# Patient Record
Sex: Male | Born: 1950
Health system: Southern US, Community
[De-identification: ages and names within clinical notes are randomized; demographics above are authoritative.]

## PROBLEM LIST (undated history)

## (undated) ENCOUNTER — Ambulatory Visit

## (undated) ENCOUNTER — Encounter: Attending: Internal Medicine | Primary: Internal Medicine

## (undated) ENCOUNTER — Ambulatory Visit: Attending: Surgery | Primary: Surgery

## (undated) ENCOUNTER — Telehealth: Attending: Internal Medicine | Primary: Internal Medicine

## (undated) ENCOUNTER — Telehealth

## (undated) ENCOUNTER — Ambulatory Visit: Payer: MEDICARE

## (undated) ENCOUNTER — Telehealth: Attending: Oncology | Primary: Oncology

## (undated) ENCOUNTER — Encounter: Attending: Oncology | Primary: Oncology

## (undated) ENCOUNTER — Encounter: Payer: MEDICARE | Attending: Internal Medicine | Primary: Internal Medicine

## (undated) ENCOUNTER — Encounter

## (undated) ENCOUNTER — Ambulatory Visit: Payer: MEDICARE | Attending: Internal Medicine | Primary: Internal Medicine

## (undated) ENCOUNTER — Inpatient Hospital Stay

## (undated) ENCOUNTER — Telehealth: Attending: Surgery | Primary: Surgery

## (undated) DIAGNOSIS — I959 Hypotension, unspecified: Secondary | ICD-10-CM

## (undated) DIAGNOSIS — K219 Gastro-esophageal reflux disease without esophagitis: Secondary | ICD-10-CM

## (undated) DIAGNOSIS — F0390 Unspecified dementia without behavioral disturbance: Secondary | ICD-10-CM

## (undated) DIAGNOSIS — I639 Cerebral infarction, unspecified: Secondary | ICD-10-CM

## (undated) DIAGNOSIS — C801 Malignant (primary) neoplasm, unspecified: Secondary | ICD-10-CM

## (undated) DIAGNOSIS — G8929 Other chronic pain: Secondary | ICD-10-CM

## (undated) DIAGNOSIS — E785 Hyperlipidemia, unspecified: Secondary | ICD-10-CM

## (undated) DIAGNOSIS — K921 Melena: Secondary | ICD-10-CM

## (undated) DIAGNOSIS — K649 Unspecified hemorrhoids: Secondary | ICD-10-CM

## (undated) DIAGNOSIS — J449 Chronic obstructive pulmonary disease, unspecified: Secondary | ICD-10-CM

## (undated) DIAGNOSIS — K920 Hematemesis: Secondary | ICD-10-CM

## (undated) DIAGNOSIS — M549 Dorsalgia, unspecified: Secondary | ICD-10-CM

## (undated) HISTORY — DX: Malignant (primary) neoplasm, unspecified: C80.1

## (undated) HISTORY — PX: HEMORRHOID SURGERY: SHX153

## (undated) HISTORY — PX: BACK SURGERY: SHX140

## (undated) HISTORY — PX: RECTAL SURGERY: SHX760

## (undated) HISTORY — PX: HERNIA REPAIR: SHX51

## (undated) HISTORY — DX: Cerebral infarction, unspecified: I63.9

## (undated) MED ORDER — PANTOPRAZOLE 40 MG TABLET,DELAYED RELEASE: 0 days

## (undated) MED ORDER — PSYLLIUM HUSK (ASPARTAME) 3.4 GRAM ORAL POWDER PACKET: Freq: Every day | ORAL | 0 days

## (undated) MED ORDER — ATORVASTATIN 80 MG TABLET: 0 days

---

## 2000-10-04 ENCOUNTER — Emergency Department (HOSPITAL_COMMUNITY): Admission: EM | Admit: 2000-10-04 | Discharge: 2000-10-04 | Payer: Self-pay | Admitting: Emergency Medicine

## 2007-02-26 ENCOUNTER — Encounter: Payer: Self-pay | Admitting: Emergency Medicine

## 2007-02-26 ENCOUNTER — Observation Stay (HOSPITAL_COMMUNITY): Admission: EM | Admit: 2007-02-26 | Discharge: 2007-02-27 | Payer: Self-pay | Admitting: Emergency Medicine

## 2007-03-09 ENCOUNTER — Ambulatory Visit (HOSPITAL_COMMUNITY): Admission: RE | Admit: 2007-03-09 | Discharge: 2007-03-09 | Payer: Self-pay | Admitting: Neurosurgery

## 2007-03-10 ENCOUNTER — Ambulatory Visit (HOSPITAL_COMMUNITY): Admission: RE | Admit: 2007-03-10 | Discharge: 2007-03-11 | Payer: Self-pay | Admitting: Neurosurgery

## 2007-04-25 ENCOUNTER — Encounter: Admission: RE | Admit: 2007-04-25 | Discharge: 2007-06-29 | Payer: Self-pay | Admitting: Neurosurgery

## 2007-05-10 ENCOUNTER — Encounter: Admission: RE | Admit: 2007-05-10 | Discharge: 2007-05-10 | Payer: Self-pay | Admitting: Neurosurgery

## 2007-07-06 ENCOUNTER — Encounter: Admission: RE | Admit: 2007-07-06 | Discharge: 2007-07-06 | Payer: Self-pay | Admitting: Neurosurgery

## 2009-12-17 ENCOUNTER — Emergency Department (HOSPITAL_COMMUNITY)
Admission: EM | Admit: 2009-12-17 | Discharge: 2009-12-17 | Payer: Self-pay | Source: Home / Self Care | Admitting: Emergency Medicine

## 2010-02-01 ENCOUNTER — Encounter: Payer: Self-pay | Admitting: Neurosurgery

## 2010-05-26 NOTE — Op Note (Signed)
Todd Berg, Todd Berg               ACCOUNT NO.:  [id number]   MEDICAL RECORD NO.:  [id number]          PATIENT TYPE:  INP   LOCATION:  3533                         FACILITY:  [facility]   PHYSICIAN:  Reinaldo Meeker, M.D. DATE OF BIRTH:  [birth year]   DATE OF PROCEDURE:  03/10/2007  DATE OF DISCHARGE:  03/11/2007                               OPERATIVE REPORT   PREOPERATIVE DIAGNOSES:  Degenerative disease and foraminal collapse, L5-  S1.   POSTOPERATIVE DIAGNOSES:  Degenerative disease and foraminal collapse,  L5-S1.   PROCEDURE:  L5-S1 AxiLIFF, followed by bilateral posterior  instrumentation with trans facet screw fixation.   SURGEON:  Reinaldo Meeker, M.D.   ASSISTANT:  Donalee Citrin, M.D.   PROCEDURE IN DETAIL:  After being placed in the prone position, the  patient's buttock and back were prepped and draped in usual sterile  fashion.  AP and lateral fluoroscopy were brought into the field; used  for excellent localization.  A lateral incision was made on the left  side of the buttock crease, in line with the lower end of the coccyx.  This was carried down through a double fascial layer and into the  retroperitoneum.  The underside of the coccyx and sacrum were finger  swept, to begin to separate the bowel away.  The blunt probe was then  placed along the edge of the sacrum, and moved from left to right in a  caudal to cephalad direction; in order to sweep down and make for a  working channel.  When it was in a good position, it was docked into the  sacrum to the distance of the superior edge of the sacrum.  The  projection was found to be excellent.  Sequential dilatation was then  carried out and the working cannula left approximately 1-2 cm into the  sacrum.  Drilling was then carried up to the superior edge of the sacrum  and then into the disk space.  A variety of rotating instruments were  used to remove disk, followed by removing them with the lyre remover.  Some  autologous bone actively infused with BMP were then placed in the  interspace, and followed under fluoroscopy into excellent position.  The  drill was then passed up into the L5 body to the appropriate level.  At  this time, the exchange cannula with a bushing was used to allow  placement of the large working cannula for placement of the screw.  This  was secured with a guidewire.  The inner bushings were then removed and  the screw placed without difficulty.  There was one in the body at L5,  again excellent distraction of the L5-S1 disk space.  At this time, the  working cannula was removed without difficulty.  The wound was then  irrigated and closed with interrupted Vicryl and Dermabond on the skin.  Percutaneous trans facet screws were then placed.  A small stab wound  incision was made by the superior edge of the spinous process of L4.  Applying AP and lateral fluoroscopy, docking tool was used to gently  enter the  inferior facet of L5.  The guidewire was then passed through  the inferior facet of L5 and into the pedicle of S1 to appropriate  depth.  Drilling was then carried out,  followed by tapping and placing  of 4 mm x 25 mm screws on the left, and 4-0 x 30 mm screws on the right.  Final fluoroscopy in AP and lateral direction showed excellent placement  of the large AxiLIFF spacer, as well as the 2 trans facet screws.  Prior  to placing intracranial facet screws, a small pledget with BMP had been  placed  down the cannula as well.  At this time, the wound was irrigated and  closed with 2 interrupted Vicryls and a Dermabond on the skin.  Sterile  dressings were then applied.  The patient was extubated and taken to the  recovery room in stable condition.           ______________________________  Reinaldo Meeker, M.D.     ROK/MEDQ  D:  03/10/2007  T:  03/11/2007  Job:  [id number]

## 2010-05-26 NOTE — Op Note (Signed)
NAMESENICA, CRALL               ACCOUNT NO.:  [id number]   MEDICAL RECORD NO.:  [id number]          PATIENT TYPE:  OIB   LOCATION:  3533                         FACILITY:  [facility]   PHYSICIAN:  Reinaldo Meeker, M.D. DATE OF BIRTH:  1950/11/05   DATE OF PROCEDURE:  03/10/2007  DATE OF DISCHARGE:  03/11/2007                               OPERATIVE REPORT   PREOPERATIVE DIAGNOSIS:  Degenerative disk disease with foraminal  collapse, L5-S1.   POSTOPERATIVE DIAGNOSIS:  Degenerative disk disease with foraminal  collapse, L5-S1.   PROCEDURES:  1. Anterior diskectomy at L5-S1 with anterior retroperitoneal      technique.  2. Placement of titanium interbody cage with allograft, autograft      fusion.  3. Placement of posterior transfacet screw at L5-S1 bilaterally with      minimally invasive technique.  4. Intra-facet fusion.   SURGEON:  Reinaldo Meeker, MD   ASSISTANT:  Donalee Citrin, MD.   HISTORY:  Todd Berg is a 60 year old gentleman with prior diskectomy and  degenerative disk at L5-S1.  He is exhausted all conservative measures  and explored all options of surgical intervention and after surgical  intervention with an anterior interbody fusion and posterior transfacet  screw stabilization.  Full informed consent and risks were discussed  including bleeding, infection, weakness, numbness, paralysis, trouble  with instrumentation, non-union, bowel perforation, and death.  We have  discussed alternative methods of therapy all risks and benefits on  intervention.  Todd Berg had the opportunity to ask questions and  appears to understand.  With this information in hand, he has requested  to proceed with surgery.  He is admitted at this time for the above-  mentioned procedure.   PROCEDURE IN DETAIL:  After the patient was taken to the surgery and  placed on general endotracheal anesthesia, he was placed the prone  position on the operating table.  The operative procedure was done  with  AP and lateral fluoroscopy and incision was made lateral to the tip of  the coccyx and the coccygeal notch.  Sacral spinous ligament was entered  with a 2-cm incision.  We punctured through the fascia, carefully guided  this on the anterior part of the sacrum in the midline and followed this  with biplanar fluoroscopy.  We guided this to the sacral promontory and  chose the sacral entry point in the midline and then tapped the  guidewire up through the sacrum across the disk into the L5-S1 disk.  The dilating trocars were placed.  The 9-mm working channel was placed  and then we drilled out the bone through the sacrum and initiated the  radial diskectomy, scraped the endplates both superiorly and inferiorly  circumferentially moving as much disk material as possible.  Tissue  retractors were used to complete the diskectomy.  After this was  completed, the disk space was then packed with a morselized allograft  bone and BMP and bone expander.  A 71.5-mm drill was then inserted  through the disk and the L5 vertebral body and the measurement was taken  to approximately a 45-mm  interbody cage.  The interbody cage then was  passed up and the exchange cannula was placed.  The interbody cage was  __________  in to position superiorly interbody of L5 engaged into the  sacral promontory S1-S2.  The exchange cannula was then removed.  The  wound was then closed with interrupted Vicryl on the subcutaneous and  subcuticular tissues and staples on the skin.  This was done after large  amounts of irrigation were carried out.  Attention was then turned to  the L5-S1 transfacet screws.  Percutaneous transfacet screws were then  placed.  A small stab wound incision was made at the superior edges to  the spinous process of L4.  Plane AP and lateral fluoroscopy docking  tool was used to gently enter the inferior facet of L5.  The guidewire  was then passed through the inferior facet of L5 and to the  pedicle of  S1 to appropriate depth.  Drill was then carried out followed by tapping  and placing of 4-mm x 25-mm screws on the left and 4-mm x 30-mm screws  on the right.  Final fluoroscopy AP and lateral direction showed  excellent placement of the 2 transfacet screws.  Prior to placing, the  intracranial facet screws, a small pledget with BMP had been applied for  an intra-facet fusion.  At this time, the wounds were irrigated  copiously and closed with Vicryl stitches and Dermabond on the skin.  Sterile dressings were then applied and the patient was extubated and  taken to recovery in stable condition.           ______________________________  Reinaldo Meeker, M.D.     ROK/MEDQ  D:  05/25/2007  T:  05/26/2007  Job:  [id number]

## 2010-05-26 NOTE — Discharge Summary (Signed)
Todd Berg, Todd Berg               ACCOUNT NO.:  [id number]   MEDICAL RECORD NO.:  [id number]          PATIENT TYPE:  OBV   LOCATION:  5024                         FACILITY:  [facility]   PHYSICIAN:  Cherylynn Ridges, M.D.    DATE OF BIRTH:  11/07/1950   DATE OF ADMISSION:  02/26/2007  DATE OF DISCHARGE:  02/27/2007                               DISCHARGE SUMMARY   DISCHARGE DIAGNOSES:  1. Fall.  2. Alcohol abuse.  3. Posterior cervical ligamentous laxity.  4. Chronic lower back pain.   CONSULTANTS:  Dr. Jordan Likes for neurosurgery.   PROCEDURE:  None.   HISTORY OF PRESENT ILLNESS:  This is a 60 year old white male who is a  habitual alcoholic.  He got inebriated yesterday and fell three times at  home.  Came in as a non-trauma code.  Workup did not demonstrate any  abnormalities, but because the patient was inebriated and it was not  felt safe for him to go home, he was transferred to [facility] from [place]  [facility] and admitted by the trauma service.   HOSPITAL COURSE:  The patient's hospital course overnight was  uneventful.  He had flexion/extension films of his C-spine the following  morning because he complained of some inferior cervical tenderness to  palpation.  Flexion/extension films showed some laxity in the posterior  ligamentous elements caudally.  These findings were reviewed with Dr.  Jordan Likes who recommended discharge home in a cervical collar.  The patient  already had a follow-up on Friday with Dr. Gerlene Fee for the preexisting  low back pain.  The patient was in agreement with this.  He was  discharged home in the care of his family in good condition.   DISCHARGE MEDICATIONS:  Norco 5/325 take one to two p.o. q.4 h p.r.n.  pain #50 with no refill.   FOLLOW UP:  The patient follow up Dr. Gerlene Fee on Friday.      Earney Hamburg, P.A.      Cherylynn Ridges, M.D.  Electronically Signed   MJ/MEDQ  D:  02/27/2007  T:  02/28/2007  Job:  [id number]   cc:   Reinaldo Meeker, M.D.

## 2010-10-02 LAB — URINALYSIS, ROUTINE W REFLEX MICROSCOPIC
Bilirubin Urine: NEGATIVE
Glucose, UA: NEGATIVE
Hgb urine dipstick: NEGATIVE
Ketones, ur: NEGATIVE
Nitrite: NEGATIVE
Protein, ur: NEGATIVE
Specific Gravity, Urine: 1.003 — ABNORMAL LOW
Urobilinogen, UA: 0.2
pH: 6.5

## 2010-10-02 LAB — DIFFERENTIAL
Basophils Absolute: 0
Basophils Absolute: 0.1
Basophils Relative: 0
Basophils Relative: 1
Eosinophils Absolute: 0.4
Eosinophils Absolute: 0.4
Eosinophils Relative: 5
Eosinophils Relative: 5
Lymphocytes Relative: 34
Lymphs Abs: 2.5
Monocytes Absolute: 0.4
Monocytes Absolute: 0.4
Monocytes Relative: 5
Neutro Abs: 4
Neutrophils Relative %: 55

## 2010-10-02 LAB — COMPREHENSIVE METABOLIC PANEL
ALT: 15
AST: 16
Albumin: 3.2 — ABNORMAL LOW
Alkaline Phosphatase: 84
BUN: 4 — ABNORMAL LOW
CO2: 25
Calcium: 8.9
Chloride: 112
Creatinine, Ser: 0.66
GFR calc Af Amer: >60
GFR calc non Af Amer: >60
Glucose, Bld: 107 — ABNORMAL HIGH
Potassium: 3.8
Sodium: 146 — ABNORMAL HIGH
Total Bilirubin: 0.4
Total Protein: 6.8

## 2010-10-02 LAB — CBC
HCT: 41.7
HCT: 42.4
HCT: 43.2
Hemoglobin: 14
Hemoglobin: 14.2
Hemoglobin: 14.7
MCHC: 33
MCHC: 34
MCHC: 34
MCV: 90.4
MCV: 90.8
Platelets: 262
Platelets: 302
RBC: 4.61
RBC: 4.69
RBC: 4.76
RDW: 11.7
RDW: 12
RDW: 12.1
WBC: 6.8
WBC: 7.4

## 2010-10-02 LAB — ETHANOL
Alcohol, Ethyl (B): 109 — ABNORMAL HIGH
Alcohol, Ethyl (B): 175 — ABNORMAL HIGH

## 2010-10-02 LAB — BASIC METABOLIC PANEL
BUN: 4 — ABNORMAL LOW
CO2: 26
CO2: 27
Calcium: 9
Chloride: 105
Creatinine, Ser: 0.7
GFR calc Af Amer: >60
GFR calc non Af Amer: >60
Glucose, Bld: 93
Glucose, Bld: 95
Potassium: 3.5
Potassium: 4.3
Sodium: 137
Sodium: 141

## 2010-10-02 LAB — RAPID URINE DRUG SCREEN, HOSP PERFORMED
Amphetamines: NOT DETECTED
Barbiturates: NOT DETECTED
Benzodiazepines: NOT DETECTED
Cocaine: NOT DETECTED
Opiates: NOT DETECTED
Tetrahydrocannabinol: NOT DETECTED

## 2011-05-17 ENCOUNTER — Other Ambulatory Visit: Payer: Self-pay | Admitting: Family Medicine

## 2011-05-17 ENCOUNTER — Ambulatory Visit (HOSPITAL_COMMUNITY)
Admission: RE | Admit: 2011-05-17 | Discharge: 2011-05-17 | Disposition: A | Discharge status: Home / Self Care | Payer: Medicare Other | Source: Ambulatory Visit | Attending: Family Medicine | Admitting: Family Medicine

## 2011-05-17 DIAGNOSIS — N433 Hydrocele, unspecified: Secondary | ICD-10-CM | POA: Insufficient documentation

## 2011-05-17 DIAGNOSIS — N50812 Left testicular pain: Secondary | ICD-10-CM

## 2011-05-17 DIAGNOSIS — I861 Scrotal varices: Secondary | ICD-10-CM | POA: Insufficient documentation

## 2011-05-17 DIAGNOSIS — N509 Disorder of male genital organs, unspecified: Secondary | ICD-10-CM | POA: Insufficient documentation

## 2012-01-18 ENCOUNTER — Ambulatory Visit: Payer: Medicare Other | Admitting: Urology

## 2012-07-04 ENCOUNTER — Ambulatory Visit (INDEPENDENT_AMBULATORY_CARE_PROVIDER_SITE_OTHER): Payer: Medicare Other | Admitting: Urology

## 2012-07-04 DIAGNOSIS — N529 Male erectile dysfunction, unspecified: Secondary | ICD-10-CM

## 2012-07-04 DIAGNOSIS — N486 Induration penis plastica: Secondary | ICD-10-CM

## 2012-09-05 ENCOUNTER — Ambulatory Visit (INDEPENDENT_AMBULATORY_CARE_PROVIDER_SITE_OTHER): Payer: Medicare Other | Admitting: Urology

## 2012-09-05 DIAGNOSIS — N529 Male erectile dysfunction, unspecified: Secondary | ICD-10-CM

## 2012-09-05 DIAGNOSIS — N486 Induration penis plastica: Secondary | ICD-10-CM

## 2012-11-07 ENCOUNTER — Ambulatory Visit (INDEPENDENT_AMBULATORY_CARE_PROVIDER_SITE_OTHER): Payer: Medicare Other | Admitting: Urology

## 2012-11-07 DIAGNOSIS — N529 Male erectile dysfunction, unspecified: Secondary | ICD-10-CM

## 2013-01-11 DIAGNOSIS — C801 Malignant (primary) neoplasm, unspecified: Secondary | ICD-10-CM

## 2013-01-11 HISTORY — DX: Malignant (primary) neoplasm, unspecified: C80.1

## 2013-06-18 ENCOUNTER — Telehealth: Payer: Self-pay | Admitting: Family Medicine

## 2013-06-18 NOTE — Telephone Encounter (Signed)
APPT GIVEN FOR IN THE AM

## 2013-06-19 ENCOUNTER — Ambulatory Visit (INDEPENDENT_AMBULATORY_CARE_PROVIDER_SITE_OTHER): Payer: Medicare Other | Admitting: Family Medicine

## 2013-06-19 ENCOUNTER — Encounter: Payer: Self-pay | Admitting: Family Medicine

## 2013-06-19 ENCOUNTER — Encounter (INDEPENDENT_AMBULATORY_CARE_PROVIDER_SITE_OTHER): Payer: Self-pay

## 2013-06-19 VITALS — BP 147/73 | HR 50 | Temp 97.0°F | Ht 70.5 in | Wt 202.2 lb

## 2013-06-19 DIAGNOSIS — W57XXXA Bitten or stung by nonvenomous insect and other nonvenomous arthropods, initial encounter: Secondary | ICD-10-CM

## 2013-06-19 DIAGNOSIS — M25579 Pain in unspecified ankle and joints of unspecified foot: Secondary | ICD-10-CM

## 2013-06-19 DIAGNOSIS — T148 Other injury of unspecified body region: Secondary | ICD-10-CM

## 2013-06-19 DIAGNOSIS — IMO0002 Reserved for concepts with insufficient information to code with codable children: Secondary | ICD-10-CM

## 2013-06-19 DIAGNOSIS — R252 Cramp and spasm: Secondary | ICD-10-CM

## 2013-06-19 DIAGNOSIS — R195 Other fecal abnormalities: Secondary | ICD-10-CM

## 2013-06-19 DIAGNOSIS — S39013A Strain of muscle, fascia and tendon of pelvis, initial encounter: Secondary | ICD-10-CM

## 2013-06-19 MED ORDER — DOXYCYCLINE HYCLATE 100 MG PO TABS: 100.0000 mg | ORAL_TABLET | Freq: Two times a day (BID) | ORAL | Status: DC

## 2013-06-19 NOTE — Progress Notes (Signed)
   Subjective:    Patient ID: Todd Berg, male    DOB: 01-24-1950, 64 y.o.   MRN: [id number]  HPI This 63 y.o. male presents for evaluation of left inguinal tenderness and concerns he may have a hernia.  He has recently been bitten by a deer tick.  He has been having some discomfort in his right foot where he dropped an object on it and it was bruised but is now better.  He has been also having a lot problems with hs cramps.  He has been having blood in his stools and states and has positive FOBT.   Review of Systems C/o left inguinal pain, tick bite, right foot discomfort, and hs cramps No chest pain, SOB, HA, dizziness, vision change, N/V, diarrhea, constipation, dysuria, urinary urgency or frequency, myalgias, arthralgias or rash.     Objective:   Physical Exam  Vital signs noted  Well developed well nourished male.  HEENT - Head atraumatic Normocephalic                Eyes - PERRLA, Conjuctiva - clear Sclera- Clear EOMI                Ears - EAC's Wnl TM's Wnl Gross Hearing WNL                Nose - Nares patent                 Throat - oropharanx wnl Respiratory - Lungs CTA bilateral Cardiac - RRR S1 and S2 without murmur GI - Abdomen soft Nontender and bowel sounds active x  GU - uncircumcised penis, testicles without masses no inguinal hernia.  Left inguinal area tender and no hernia. Extremities - No edema. Neuro - Grossly intact. Skin - Umbilicus with erythematous area from tick bite MS - right foot normal exam and no bruising     Assessment & Plan:  Heme positive stool - Plan: Ambulatory referral to Gastroenterology  Tick bite - Plan: doxycycline (VIBRA-TABS) 100 MG tablet  Pain in joint, ankle and foot - No deformity or signs of injury and take motrin otc  Inguinal muscle strain - Reassured no hernia and recommend motrin otc  Leg  Cramps - Tonic water otc  Lysbeth Penner FNP

## 2013-06-20 ENCOUNTER — Encounter (INDEPENDENT_AMBULATORY_CARE_PROVIDER_SITE_OTHER): Payer: Self-pay | Admitting: *Deleted

## 2013-07-12 ENCOUNTER — Ambulatory Visit (INDEPENDENT_AMBULATORY_CARE_PROVIDER_SITE_OTHER): Payer: Medicare Other | Admitting: Internal Medicine

## 2013-11-06 ENCOUNTER — Ambulatory Visit: Payer: Medicare Other | Admitting: Urology

## 2013-12-12 ENCOUNTER — Telehealth: Payer: Self-pay | Admitting: Family Medicine

## 2013-12-13 ENCOUNTER — Ambulatory Visit: Payer: Medicare Other | Admitting: Family Medicine

## 2014-06-12 ENCOUNTER — Encounter (INDEPENDENT_AMBULATORY_CARE_PROVIDER_SITE_OTHER): Payer: Self-pay

## 2014-06-12 ENCOUNTER — Encounter: Payer: Self-pay | Admitting: Family

## 2014-06-12 ENCOUNTER — Ambulatory Visit (INDEPENDENT_AMBULATORY_CARE_PROVIDER_SITE_OTHER): Payer: Commercial Managed Care - HMO | Admitting: Family

## 2014-06-12 VITALS — BP 139/82 | HR 57 | Temp 96.7°F | Ht 70.5 in | Wt 203.4 lb

## 2014-06-12 DIAGNOSIS — L0100 Impetigo, unspecified: Secondary | ICD-10-CM

## 2014-06-12 DIAGNOSIS — G44209 Tension-type headache, unspecified, not intractable: Secondary | ICD-10-CM

## 2014-06-12 MED ORDER — BUTALBITAL-APAP-CAFFEINE 50-325-40 MG PO TABS: 1.0000 | ORAL_TABLET | Freq: Four times a day (QID) | ORAL | Status: DC | PRN

## 2014-06-12 MED ORDER — METHYLPREDNISOLONE ACETATE 80 MG/ML IJ SUSP
80.0000 mg | Freq: Once | INTRAMUSCULAR | Status: AC
  Administered 2014-06-12: 80 mg via INTRAMUSCULAR

## 2014-06-12 MED ORDER — KETOROLAC TROMETHAMINE 60 MG/2ML IM SOLN
30.0000 mg | Freq: Once | INTRAMUSCULAR | Status: AC
  Administered 2014-06-12: 30 mg via INTRAMUSCULAR

## 2014-06-12 MED ORDER — MUPIROCIN 2 % EX OINT: 1.0000 "application " | TOPICAL_OINTMENT | Freq: Two times a day (BID) | CUTANEOUS | Status: DC

## 2014-06-12 NOTE — Patient Instructions (Signed)
Tension Headache A tension headache is a feeling of pain, pressure, or aching often felt over the front and sides of the head. The pain can be dull or can feel tight (constricting). It is the most common type of headache. Tension headaches are not normally associated with nausea or vomiting and do not get worse with physical activity. Tension headaches can last 30 minutes to several days.  CAUSES  The exact cause is not known, but it may be caused by chemicals and hormones in the brain that lead to pain. Tension headaches often begin after stress, anxiety, or depression. Other triggers may include:  Alcohol.  Caffeine (too much or withdrawal).  Respiratory infections (colds, flu, sinus infections).  Dental problems or teeth clenching.  Fatigue.  Holding your head and neck in one position too long while using a computer. SYMPTOMS   Pressure around the head.   Dull, aching head pain.   Pain felt over the front and sides of the head.   Tenderness in the muscles of the head, neck, and shoulders. DIAGNOSIS  A tension headache is often diagnosed based on:   Symptoms.   Physical examination.   A CT scan or MRI of your head. These tests may be ordered if symptoms are severe or unusual. TREATMENT  Medicines may be given to help relieve symptoms.  HOME CARE INSTRUCTIONS   Only take over-the-counter or prescription medicines for pain or discomfort as directed by your caregiver.   Lie down in a dark, quiet room when you have a headache.   Keep a journal to find out what may be triggering your headaches. For example, write down:  What you eat and drink.  How much sleep you get.  Any change to your diet or medicines.  Try massage or other relaxation techniques.   Ice packs or heat applied to the head and neck can be used. Use these 3 to 4 times per day for 15 to 20 minutes each time, or as needed.   Limit stress.   Sit up straight, and do not tense your muscles.    Quit smoking if you smoke.  Limit alcohol use.  Decrease the amount of caffeine you drink, or stop drinking caffeine.  Eat and exercise regularly.  Get 7 to 9 hours of sleep, or as recommended by your caregiver.  Avoid excessive use of pain medicine as recurrent headaches can occur.  SEEK MEDICAL CARE IF:   You have problems with the medicines you were prescribed.  Your medicines do not work.  You have a change from the usual headache.  You have nausea or vomiting. SEEK IMMEDIATE MEDICAL CARE IF:   Your headache becomes severe.  You have a fever.  You have a stiff neck.  You have loss of vision.  You have muscular weakness or loss of muscle control.  You lose your balance or have trouble walking.  You feel faint or pass out.  You have severe symptoms that are different from your first symptoms. MAKE SURE YOU:   Understand these instructions.  Will watch your condition.  Will get help right away if you are not doing well or get worse. Document Released: 12/28/2004 Document Revised: 03/22/2011 Document Reviewed: 12/18/2010 [facility] Patient Information 2015 [place], [place]. This information is not intended to replace advice given to you by your health care provider. Make sure you discuss any questions you have with your health care provider. Headaches, Frequently Asked Questions MIGRAINE HEADACHES Q: What is migraine? What causes it?  How can I treat it? A: Generally, migraine headaches begin as a dull ache. Then they develop into a constant, throbbing, and pulsating pain. You may experience pain at the temples. You may experience pain at the front or back of one or both sides of the head. The pain is usually accompanied by a combination of:  Nausea.  Vomiting.  Sensitivity to light and noise. Some people (about 15%) experience an aura (see below) before an attack. The cause of migraine is believed to be chemical reactions in the brain. Treatment for  migraine may include over-the-counter or prescription medications. It may also include self-help techniques. These include relaxation training and biofeedback.  Q: What is an aura? A: About 15% of people with migraine get an "aura". This is a sign of neurological symptoms that occur before a migraine headache. You may see wavy or jagged lines, dots, or flashing lights. You might experience tunnel vision or blind spots in one or both eyes. The aura can include visual or auditory hallucinations (something imagined). It may include disruptions in smell (such as strange odors), taste or touch. Other symptoms include:  Numbness.  A "pins and needles" sensation.  Difficulty in recalling or speaking the correct word. These neurological events may last as long as 60 minutes. These symptoms will fade as the headache begins. Q: What is a trigger? A: Certain physical or environmental factors can lead to or "trigger" a migraine. These include:  Foods.  Hormonal changes.  Weather.  Stress. It is important to remember that triggers are different for everyone. To help prevent migraine attacks, you need to figure out which triggers affect you. Keep a headache diary. This is a good way to track triggers. The diary will help you talk to your healthcare professional about your condition. Q: Does weather affect migraines? A: Bright sunshine, hot, humid conditions, and drastic changes in barometric pressure may lead to, or "trigger," a migraine attack in some people. But studies have shown that weather does not act as a trigger for everyone with migraines. Q: What is the link between migraine and hormones? A: Hormones start and regulate many of your body's functions. Hormones keep your body in balance within a constantly changing environment. The levels of hormones in your body are unbalanced at times. Examples are during menstruation, pregnancy, or menopause. That can lead to a migraine attack. In fact, about  three quarters of all women with migraine report that their attacks are related to the menstrual cycle.  Q: Is there an increased risk of stroke for migraine sufferers? A: The likelihood of a migraine attack causing a stroke is very remote. That is not to say that migraine sufferers cannot have a stroke associated with their migraines. In persons under age 14, the most common associated factor for stroke is migraine headache. But over the course of a person's normal life span, the occurrence of migraine headache may actually be associated with a reduced risk of dying from cerebrovascular disease due to stroke.  Q: What are acute medications for migraine? A: Acute medications are used to treat the pain of the headache after it has started. Examples over-the-counter medications, NSAIDs, ergots, and triptans.  Q: What are the triptans? A: Triptans are the newest class of abortive medications. They are specifically targeted to treat migraine. Triptans are vasoconstrictors. They moderate some chemical reactions in the brain. The triptans work on receptors in your brain. Triptans help to restore the balance of a neurotransmitter called serotonin. Fluctuations in levels  of serotonin are thought to be a main cause of migraine.  Q: Are over-the-counter medications for migraine effective? A: Over-the-counter, or "OTC," medications may be effective in relieving mild to moderate pain and associated symptoms of migraine. But you should see your caregiver before beginning any treatment regimen for migraine.  Q: What are preventive medications for migraine? A: Preventive medications for migraine are sometimes referred to as "prophylactic" treatments. They are used to reduce the frequency, severity, and length of migraine attacks. Examples of preventive medications include antiepileptic medications, antidepressants, beta-blockers, calcium channel blockers, and NSAIDs (nonsteroidal anti-inflammatory drugs). Q: Why are  anticonvulsants used to treat migraine? A: During the past few years, there has been an increased interest in antiepileptic drugs for the prevention of migraine. They are sometimes referred to as "anticonvulsants". Both epilepsy and migraine may be caused by similar reactions in the brain.  Q: Why are antidepressants used to treat migraine? A: Antidepressants are typically used to treat people with depression. They may reduce migraine frequency by regulating chemical levels, such as serotonin, in the brain.  Q: What alternative therapies are used to treat migraine? A: The term "alternative therapies" is often used to describe treatments considered outside the scope of conventional Western medicine. Examples of alternative therapy include acupuncture, acupressure, and yoga. Another common alternative treatment is herbal therapy. Some herbs are believed to relieve headache pain. Always discuss alternative therapies with your caregiver before proceeding. Some herbal products contain arsenic and other toxins. TENSION HEADACHES Q: What is a tension-type headache? What causes it? How can I treat it? A: Tension-type headaches occur randomly. They are often the result of temporary stress, anxiety, fatigue, or anger. Symptoms include soreness in your temples, a tightening band-like sensation around your head (a "vice-like" ache). Symptoms can also include a pulling feeling, pressure sensations, and contracting head and neck muscles. The headache begins in your forehead, temples, or the back of your head and neck. Treatment for tension-type headache may include over-the-counter or prescription medications. Treatment may also include self-help techniques such as relaxation training and biofeedback. CLUSTER HEADACHES Q: What is a cluster headache? What causes it? How can I treat it? A: Cluster headache gets its name because the attacks come in groups. The pain arrives with little, if any, warning. It is usually on  one side of the head. A tearing or bloodshot eye and a runny nose on the same side of the headache may also accompany the pain. Cluster headaches are believed to be caused by chemical reactions in the brain. They have been described as the most severe and intense of any headache type. Treatment for cluster headache includes prescription medication and oxygen. SINUS HEADACHES Q: What is a sinus headache? What causes it? How can I treat it? A: When a cavity in the bones of the face and skull (a sinus) becomes inflamed, the inflammation will cause localized pain. This condition is usually the result of an allergic reaction, a tumor, or an infection. If your headache is caused by a sinus blockage, such as an infection, you will probably have a fever. An x-ray will confirm a sinus blockage. Your caregiver's treatment might include antibiotics for the infection, as well as antihistamines or decongestants.  REBOUND HEADACHES Q: What is a rebound headache? What causes it? How can I treat it? A: A pattern of taking acute headache medications too often can lead to a condition known as "rebound headache." A pattern of taking too much headache medication includes taking it more  than 2 days per week or in excessive amounts. That means more than the label or a caregiver advises. With rebound headaches, your medications not only stop relieving pain, they actually begin to cause headaches. Doctors treat rebound headache by tapering the medication that is being overused. Sometimes your caregiver will gradually substitute a different type of treatment or medication. Stopping may be a challenge. Regularly overusing a medication increases the potential for serious side effects. Consult a caregiver if you regularly use headache medications more than 2 days per week or more than the label advises. ADDITIONAL QUESTIONS AND ANSWERS Q: What is biofeedback? A: Biofeedback is a self-help treatment. Biofeedback uses special equipment  to monitor your body's involuntary physical responses. Biofeedback monitors:  Breathing.  Pulse.  Heart rate.  Temperature.  Muscle tension.  Brain activity. Biofeedback helps you refine and perfect your relaxation exercises. You learn to control the physical responses that are related to stress. Once the technique has been mastered, you do not need the equipment any more. Q: Are headaches hereditary? A: Four out of five (80%) of people that suffer report a family history of migraine. Scientists are not sure if this is genetic or a family predisposition. Despite the uncertainty, a child has a 50% chance of having migraine if one parent suffers. The child has a 75% chance if both parents suffer.  Q: Can children get headaches? A: By the time they reach high school, most young people have experienced some type of headache. Many safe and effective approaches or medications can prevent a headache from occurring or stop it after it has begun.  Q: What type of doctor should I see to diagnose and treat my headache? A: Start with your primary caregiver. Discuss his or her experience and approach to headaches. Discuss methods of classification, diagnosis, and treatment. Your caregiver may decide to recommend you to a headache specialist, depending upon your symptoms or other physical conditions. Having diabetes, allergies, etc., may require a more comprehensive and inclusive approach to your headache. The National Headache Foundation will provide, upon request, a list of [facility] physician members in your state. Document Released: 03/20/2003 Document Revised: 03/22/2011 Document Reviewed: 08/28/2007 [facility] Patient Information 2015 [place], [place]. This information is not intended to replace advice given to you by your health care provider. Make sure you discuss any questions you have with your health care provider.

## 2014-06-12 NOTE — Progress Notes (Signed)
   Subjective:    Patient ID: Todd Berg, male    DOB: Sep 05, 1950, 64 y.o.   MRN: [id number]  Headache  This is a recurrent problem. The current episode started more than 1 month ago. The problem occurs constantly. The problem has been unchanged. The pain is located in the frontal region. The pain does not radiate. The quality of the pain is described as pulsating and shooting. The pain is at a severity of 10/10. The pain is moderate. Associated symptoms include coughing and scalp tenderness. Pertinent negatives include no back pain, blurred vision, ear pain, eye pain, eye redness, eye watering, nausea, phonophobia, photophobia, sinus pressure, sore throat, visual change or vomiting. Nothing aggravates the symptoms. He has tried acetaminophen for the symptoms. The treatment provided mild relief. His past medical history is significant for migraine headaches.      Review of Systems  Constitutional: Negative.   HENT: Negative for ear pain, sinus pressure and sore throat.   Eyes: Negative for blurred vision, photophobia, pain and redness.  Respiratory: Positive for cough.   Cardiovascular: Negative.   Gastrointestinal: Negative.  Negative for nausea and vomiting.  Endocrine: Negative.   Genitourinary: Negative.   Musculoskeletal: Negative.  Negative for back pain.  Neurological: Positive for headaches.  Hematological: Negative.   Psychiatric/Behavioral: Negative.   All other systems reviewed and are negative.      Objective:   Physical Exam  Constitutional: He is oriented to person, place, and time. He appears well-developed and well-nourished. No distress.  HENT:  Head: Normocephalic.  Right Ear: External ear normal.  Left Ear: External ear normal.  Nose: Nose normal.  Mouth/Throat: Oropharynx is clear and moist.  Eyes: Pupils are equal, round, and reactive to light. Right eye exhibits no discharge. Left eye exhibits no discharge.  Neck: Normal range of motion. Neck supple. No  thyromegaly present.  Cardiovascular: Normal rate, regular rhythm, normal heart sounds and intact distal pulses.   No murmur heard. Pulmonary/Chest: Effort normal and breath sounds normal. No respiratory distress. He has no wheezes.  Abdominal: Soft. Bowel sounds are normal. He exhibits no distension. There is no tenderness.  Musculoskeletal: Normal range of motion. He exhibits no edema or tenderness.  Neurological: He is alert and oriented to person, place, and time. He has normal reflexes. No cranial nerve deficit.  Skin: Skin is warm and dry. No rash noted. No erythema.  Psychiatric: He has a normal mood and affect. His behavior is normal. Judgment and thought content normal.  Vitals reviewed.   BP 139/82 mmHg  Pulse 57  Temp(Src) 96.7 F (35.9 C) (Oral)  Ht 5' 10.5" (1.791 m)  Wt 203 lb 6.4 oz (92.262 kg)  BMI 28.76 kg/m2       Assessment & Plan:  1. Tension-type headache, not intractable, unspecified chronicity pattern -Stress management discussed -Adequate sleep encouraged -Limit caffeine and discussed diet changed -RTO 2 weeks  - CMP14+EGFR - ketorolac (TORADOL) injection 30 mg; Inject 1 mL (30 mg total) into the muscle once. - methylPREDNISolone acetate (DEPO-MEDROL) injection 80 mg; Inject 1 mL (80 mg total) into the muscle once. - butalbital-acetaminophen-caffeine (FIORICET) 50-325-40 MG per tablet; Take 1-2 tablets by mouth every 6 (six) hours as needed for headache.  Dispense: 20 tablet; Refill: 0  2. Impetigo -Do not pick or scratch -Keep clean and dry - mupirocin ointment (BACTROBAN) 2 %; Place 1 application into the nose 2 (two) times daily.  Dispense: 22 g; Refill: 0  Evelina Dun, FNP

## 2014-06-13 LAB — CMP14+EGFR
ALT: 10 IU/L (ref 0–44)
AST: 10 IU/L (ref 0–40)
Albumin/Globulin Ratio: 1.5 (ref 1.1–2.5)
Albumin: 4 g/dL (ref 3.6–4.8)
Alkaline Phosphatase: 77 IU/L (ref 39–117)
BUN / CREAT RATIO: 15 (ref 10–22)
BUN: 15 mg/dL (ref 8–27)
Bilirubin Total: 0.2 mg/dL (ref 0.0–1.2)
CO2: 26 mmol/L (ref 18–29)
Calcium: 9.6 mg/dL (ref 8.6–10.2)
Chloride: 104 mmol/L (ref 97–108)
Creatinine, Ser: 0.98 mg/dL (ref 0.76–1.27)
GFR calc Af Amer: 93 mL/min/{1.73_m2} (ref >59)
GFR calc non Af Amer: 81 mL/min/{1.73_m2} (ref >59)
Globulin, Total: 2.6 g/dL (ref 1.5–4.5)
Glucose: 75 mg/dL (ref 65–99)
POTASSIUM: 4.2 mmol/L (ref 3.5–5.2)
SODIUM: 144 mmol/L (ref 134–144)
Total Protein: 6.6 g/dL (ref 6.0–8.5)

## 2014-06-27 ENCOUNTER — Telehealth: Payer: Self-pay | Admitting: Family

## 2014-06-27 DIAGNOSIS — W57XXXA Bitten or stung by nonvenomous insect and other nonvenomous arthropods, initial encounter: Secondary | ICD-10-CM

## 2014-06-27 NOTE — Telephone Encounter (Signed)
Request already sent to provider

## 2014-06-27 NOTE — Telephone Encounter (Signed)
TC back to pt's wife - explained that if they saw the Doxycycline listed on the AVS summary from 6/1 visit this was were [place] since it was given to him a year ago.  Wife stated that he had mentioned tick bites at his visit with [facility] and would like to be treated. Had one recently on his back and side. Last one wife pulled off was Tues night of this week. Please send Rx to [facility]

## 2014-06-28 MED ORDER — DOXYCYCLINE HYCLATE 100 MG PO TABS: 100.0000 mg | ORAL_TABLET | Freq: Two times a day (BID) | ORAL | Status: DC

## 2014-06-28 NOTE — Telephone Encounter (Signed)
Prescription sent to pharmacy.

## 2014-07-24 ENCOUNTER — Ambulatory Visit: Payer: Commercial Managed Care - HMO | Admitting: Physician Assistant

## 2014-10-23 ENCOUNTER — Ambulatory Visit (INDEPENDENT_AMBULATORY_CARE_PROVIDER_SITE_OTHER): Payer: Commercial Managed Care - HMO | Admitting: Family Medicine

## 2014-10-23 ENCOUNTER — Encounter: Payer: Self-pay | Admitting: Family Medicine

## 2014-10-23 ENCOUNTER — Telehealth: Payer: Self-pay

## 2014-10-23 ENCOUNTER — Ambulatory Visit (HOSPITAL_COMMUNITY)
Admission: RE | Admit: 2014-10-23 | Discharge: 2014-10-23 | Disposition: A | Discharge status: Home / Self Care | Payer: Commercial Managed Care - HMO | Source: Ambulatory Visit | Attending: Family Medicine | Admitting: Family Medicine

## 2014-10-23 VITALS — BP 148/88 | HR 70

## 2014-10-23 DIAGNOSIS — S0191XA Laceration without foreign body of unspecified part of head, initial encounter: Secondary | ICD-10-CM | POA: Diagnosis not present

## 2014-10-23 DIAGNOSIS — S0990XA Unspecified injury of head, initial encounter: Secondary | ICD-10-CM

## 2014-10-23 DIAGNOSIS — J329 Chronic sinusitis, unspecified: Secondary | ICD-10-CM | POA: Insufficient documentation

## 2014-10-23 DIAGNOSIS — R51 Headache: Secondary | ICD-10-CM | POA: Diagnosis not present

## 2014-10-23 DIAGNOSIS — X58XXXA Exposure to other specified factors, initial encounter: Secondary | ICD-10-CM | POA: Diagnosis not present

## 2014-10-23 NOTE — Patient Instructions (Signed)

## 2014-10-23 NOTE — Telephone Encounter (Signed)
Received call from [facility] at [facility] Radiology, CT scan normal.  Patient was informed of normal results and okayed to proceed home.

## 2014-10-23 NOTE — Progress Notes (Signed)
BP 148/88 mmHg  Pulse 70   Subjective:    Patient ID: Todd Berg, male    DOB: April 17, 1950, 64 y.o.   MRN: [id number]  HPI: Todd Berg is a 64 y.o. 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 Currently good laceration on his anterior right forehead. He denies loss of consciousness, blurred vision, hearing changes. He does have a headache that was much improved with Tylenol that we gave him here. He denies any numbness or weakness anywhere in his body.  Relevant past medical, surgical, family and social history reviewed and updated as indicated. Interim medical history since our last visit reviewed. Allergies and medications reviewed and updated.  Review of Systems  Constitutional: Negative for fever.  HENT: Negative for ear discharge and ear pain.   Eyes: Negative for discharge and visual disturbance.  Respiratory: Negative for shortness of breath and wheezing.   Cardiovascular: Negative for chest pain and leg swelling.  Gastrointestinal: Negative for abdominal pain, diarrhea and constipation.  Genitourinary: Negative for difficulty urinating.  Musculoskeletal: Negative for back pain and gait problem.  Skin: Positive for wound. Negative for rash.  Neurological: Negative for dizziness, tremors, syncope, facial asymmetry, weakness, light-headedness, numbness and headaches.  All other systems reviewed and are negative.   Per HPI unless specifically indicated above     Medication List    Notice  As of 10/23/2014 11:07 AM   You have not been prescribed any medications.         Objective:    BP 148/88 mmHg  Pulse 70  Wt Readings from Last 3 Encounters:  06/12/14 203 lb 6.4 oz (92.262 kg)  06/19/13 202 lb 3.2 oz (91.717 kg)    Physical Exam  Constitutional: He is oriented to person, place, and time. He  appears well-developed and well-nourished. No distress.  Eyes: Conjunctivae and EOM are normal. Pupils are equal, round, and reactive to light. Right eye exhibits no discharge. No scleral icterus.  Cardiovascular: Normal rate, regular rhythm, normal heart sounds and intact distal pulses.   No murmur heard. Pulmonary/Chest: Effort normal and breath sounds normal. No respiratory distress. He has no wheezes.  Abdominal: He exhibits no distension.  Musculoskeletal: Normal range of motion. He exhibits no edema.  Neurological: He is alert and oriented to person, place, and time. He has normal strength. No cranial nerve deficit or sensory deficit. He exhibits normal muscle tone. Coordination normal.  Skin: Skin is warm and dry. Laceration noted. No rash noted. He is not diaphoretic.     Psychiatric: He has a normal mood and affect. His behavior is normal.  Vitals reviewed.   Results for orders placed or performed in visit on 06/12/14  CMP14+EGFR  Result Value Ref Range   Glucose 75 65 - 99 mg/dL   BUN 15 8 - 27 mg/dL   Creatinine, Ser 0.98 0.76 - 1.27 mg/dL   GFR calc non Af Amer 81 >59 mL/min/1.73   GFR calc Af Amer 93 >59 mL/min/1.73   BUN/Creatinine Ratio 15 10 - 22   Sodium 144 134 - 144 mmol/L   Potassium 4.2 3.5 - 5.2 mmol/L   Chloride 104 97 - 108 mmol/L   CO2 26 18 - 29 mmol/L   Calcium 9.6 8.6 - 10.2 mg/dL   Total Protein 6.6 6.0 -  8.5 g/dL   Albumin 4.0 3.6 - 4.8 g/dL   Globulin, Total 2.6 1.5 - 4.5 g/dL   Albumin/Globulin Ratio 1.5 1.1 - 2.5   Bilirubin Total 0.2 0.0 - 1.2 mg/dL   Alkaline Phosphatase 77 39 - 117 IU/L   AST 10 0 - 40 IU/L   ALT 10 0 - 44 IU/L    Wound repair: Patient has a vertical incision on right lateral forehead near scalp line.cleaned sufficiently with saline wash. Used 3 mL of 2% with lidocaine to anesthetize the area. Placed 4 simple interrupted sutures with 3-0 Vicryl. Afterwards applied topical antibiotic and compression bandage.    Assessment &  Plan:   Problem List Items Addressed This Visit    None    Visit Diagnoses    Head trauma, initial encounter    -  Primary    Relevant Orders    CT Head Wo Contrast (Completed)        Follow up plan: Return in about 7 days (around 10/30/2014), or if symptoms worsen or fail to improve, for suture removal and well exam.  Caryl Pina, MD [facility] 10/23/2014, 11:07 AM

## 2014-10-24 ENCOUNTER — Telehealth: Payer: Self-pay | Admitting: Family Medicine

## 2014-10-24 NOTE — Telephone Encounter (Signed)
Contacted patient, he does remember speaking with me yesterday, but I explained again to the patient that his CT results were normal.  He wants to know if we can give him a note stating he is okay to return to work on Monday.
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
## 2014-10-24 NOTE — Telephone Encounter (Signed)
Pt aware work note is at front desk ready for pickup

## 2014-10-24 NOTE — Telephone Encounter (Signed)
Yes that is fine go ahead and send out for him. He just needs to keep the wound site clean and covered while he is working. Caryl Pina, MD [facility] 10/24/2014, 3:48 PM

## 2014-10-30 ENCOUNTER — Encounter: Payer: Self-pay | Admitting: Family Medicine

## 2014-10-30 ENCOUNTER — Ambulatory Visit (INDEPENDENT_AMBULATORY_CARE_PROVIDER_SITE_OTHER): Payer: Commercial Managed Care - HMO | Admitting: Family Medicine

## 2014-10-30 VITALS — BP 133/86 | HR 62 | Temp 97.4°F | Ht 70.5 in | Wt 202.8 lb

## 2014-10-30 DIAGNOSIS — Z4802 Encounter for removal of sutures: Secondary | ICD-10-CM | POA: Diagnosis not present

## 2014-10-30 NOTE — Progress Notes (Signed)
Patient comes in today for suture removal. He had 4 sutures placed on his right anterior scalp. Incision is healing well and 4 sutures removed without issue. No signs of infection were noted. Sterile dressing placed on after. Return as needed.  Problem List Items Addressed This Visit    None    Visit Diagnoses    Visit for suture removal    -  Primary    Remove 4 sutures from right forehead at the hairline. Wound is well healing and no signs of infection.       Caryl Pina, MD [facility] 10/30/2014, 2:55 PM

## 2014-12-17 DIAGNOSIS — H5210 Myopia, unspecified eye: Secondary | ICD-10-CM | POA: Diagnosis not present

## 2014-12-17 DIAGNOSIS — H5200 Hypermetropia, unspecified eye: Secondary | ICD-10-CM | POA: Diagnosis not present

## 2015-01-16 ENCOUNTER — Ambulatory Visit (INDEPENDENT_AMBULATORY_CARE_PROVIDER_SITE_OTHER): Payer: Commercial Managed Care - HMO | Admitting: Family Medicine

## 2015-01-16 ENCOUNTER — Encounter: Payer: Self-pay | Admitting: Family Medicine

## 2015-01-16 VITALS — BP 108/67 | HR 73 | Temp 98.1°F | Ht 70.5 in | Wt 198.6 lb

## 2015-01-16 DIAGNOSIS — J209 Acute bronchitis, unspecified: Secondary | ICD-10-CM | POA: Diagnosis not present

## 2015-01-16 LAB — POCT INFLUENZA A/B
INFLUENZA A, POC: NEGATIVE
Influenza B, POC: NEGATIVE

## 2015-01-16 MED ORDER — AZITHROMYCIN 250 MG PO TABS: ORAL_TABLET | ORAL | Status: DC

## 2015-01-16 MED ORDER — FLUTICASONE PROPIONATE 50 MCG/ACT NA SUSP: 2.0000 | Freq: Every day | NASAL | Status: DC

## 2015-01-16 MED ORDER — BENZONATATE 100 MG PO CAPS: 100.0000 mg | ORAL_CAPSULE | Freq: Two times a day (BID) | ORAL | Status: DC | PRN

## 2015-01-16 NOTE — Progress Notes (Signed)
BP 108/67 mmHg  Pulse 73  Temp(Src) 98.1 F (36.7 C) (Oral)  Ht 5' 10.5" (1.791 m)  Wt 198 lb 9.6 oz (90.084 kg)  BMI 28.08 kg/m2   Subjective:    Patient ID: Todd Berg, male    DOB: 08-Mar-1950, 65 y.o.   MRN: [id number]  HPI: Todd Berg is a 65 y.o. male presenting on 01/16/2015 for Bronchitis; Cough; and Generalized Body Aches   HPI Sinus congestion and cough Patient has been having sinus congestion and cough. He denies any fevers or chills. This has been going on for the past 5-6 days. He has been having chest congestion and cough is productive of yellow sputum. He has also had generalized body aches and malaise and lack of energy. Also had sinus congestion and postnasal drainage. He does not know of any sick contacts. He has tried some over-the-counter Alka-Seltzer.  Relevant past medical, surgical, family and social history reviewed and updated as indicated. Interim medical history since our last visit reviewed. Allergies and medications reviewed and updated.  Review of Systems  Constitutional: Negative for fever and chills.  HENT: Positive for congestion, postnasal drip, rhinorrhea, sinus pressure and sore throat. Negative for ear discharge, ear pain, sneezing and voice change.   Eyes: Negative for pain, discharge, redness and visual disturbance.  Respiratory: Positive for cough. Negative for chest tightness, shortness of breath and wheezing.   Cardiovascular: Negative for chest pain, palpitations and leg swelling.  Gastrointestinal: Negative for abdominal pain, diarrhea and constipation.  Genitourinary: Negative for difficulty urinating.  Musculoskeletal: Negative for back pain and gait problem.  Skin: Negative for rash.  Neurological: Negative for syncope, light-headedness and headaches.  All other systems reviewed and are negative.   Per HPI unless specifically indicated above     Medication List    Notice  As of 01/16/2015  9:23 AM   You have not been  prescribed any medications.         Objective:    BP 108/67 mmHg  Pulse 73  Temp(Src) 98.1 F (36.7 C) (Oral)  Ht 5' 10.5" (1.791 m)  Wt 198 lb 9.6 oz (90.084 kg)  BMI 28.08 kg/m2  Wt Readings from Last 3 Encounters:  01/16/15 198 lb 9.6 oz (90.084 kg)  10/30/14 202 lb 12.8 oz (91.989 kg)  06/12/14 203 lb 6.4 oz (92.262 kg)    Physical Exam  Constitutional: He is oriented to person, place, and time. He appears well-developed and well-nourished. No distress.  HENT:  Right Ear: Tympanic membrane, external ear and ear canal normal.  Left Ear: Tympanic membrane, external ear and ear canal normal.  Nose: Mucosal edema and rhinorrhea present. No sinus tenderness. No epistaxis. Right sinus exhibits maxillary sinus tenderness. Right sinus exhibits no frontal sinus tenderness. Left sinus exhibits maxillary sinus tenderness. Left sinus exhibits no frontal sinus tenderness.  Mouth/Throat: Uvula is midline and mucous membranes are normal. Posterior oropharyngeal edema and posterior oropharyngeal erythema present. No oropharyngeal exudate or tonsillar abscesses.  Eyes: Conjunctivae and EOM are normal. Pupils are equal, round, and reactive to light. Right eye exhibits no discharge. No scleral icterus.  Cardiovascular: Normal rate, regular rhythm, normal heart sounds and intact distal pulses.   No murmur heard. Pulmonary/Chest: Effort normal. No respiratory distress. He has no decreased breath sounds. He has no wheezes. He has rhonchi in the right upper field and the left upper field. He has no rales.  Abdominal: He exhibits no distension.  Musculoskeletal: Normal range of motion. He  exhibits no edema.  Neurological: He is alert and oriented to person, place, and time. Coordination normal.  Skin: Skin is warm and dry. No rash noted. He is not diaphoretic.  Psychiatric: He has a normal mood and affect. His behavior is normal.  Vitals reviewed.     Assessment & Plan:   Problem List Items  Addressed This Visit    None    Visit Diagnoses    Acute bronchitis, unspecified organism    -  Primary    flu negative, because of severity will treat with azithromycin and Tessalon Perles and Flonase    Relevant Medications    azithromycin (ZITHROMAX) 250 MG tablet    fluticasone (FLONASE) 50 MCG/ACT nasal spray    benzonatate (TESSALON) 100 MG capsule    Other Relevant Orders    POCT Influenza A/B (Completed)        Follow up plan: Return if symptoms worsen or fail to improve.  Counseling provided for all of the vaccine components Orders Placed This Encounter  Procedures  . POCT Influenza A/B    Caryl Pina, MD [facility] 01/16/2015, 9:23 AM

## 2015-01-31 ENCOUNTER — Encounter: Payer: Commercial Managed Care - HMO | Admitting: Family Medicine

## 2015-02-03 ENCOUNTER — Encounter: Payer: Self-pay | Admitting: Family Medicine

## 2015-02-13 ENCOUNTER — Ambulatory Visit (INDEPENDENT_AMBULATORY_CARE_PROVIDER_SITE_OTHER): Payer: Commercial Managed Care - HMO | Admitting: Family Medicine

## 2015-02-13 ENCOUNTER — Encounter: Payer: Self-pay | Admitting: Family Medicine

## 2015-02-13 VITALS — BP 140/76 | HR 52 | Temp 97.2°F | Ht 70.5 in | Wt 204.8 lb

## 2015-02-13 DIAGNOSIS — K5900 Constipation, unspecified: Secondary | ICD-10-CM | POA: Diagnosis not present

## 2015-02-13 DIAGNOSIS — E663 Overweight: Secondary | ICD-10-CM | POA: Diagnosis not present

## 2015-02-13 MED ORDER — DOCUSATE SODIUM 100 MG PO CAPS: 100.0000 mg | ORAL_CAPSULE | Freq: Two times a day (BID) | ORAL | Status: DC

## 2015-02-13 MED ORDER — POLYETHYLENE GLYCOL 3350 17 GM/SCOOP PO POWD: 17.0000 g | Freq: Every day | ORAL | Status: DC | PRN

## 2015-02-13 NOTE — Progress Notes (Signed)
BP 140/76 mmHg  Pulse 52  Temp(Src) 97.2 F (36.2 C) (Oral)  Ht 5' 10.5" (1.791 m)  Wt 204 lb 12.8 oz (92.897 kg)  BMI 28.96 kg/m2   Subjective:    Patient ID: Todd Berg, male    DOB: 1950-01-30, 65 y.o.   MRN: [id number]  HPI: Todd Berg is a 65 y.o. male presenting on 02/13/2015 for Annual Exam   HPI Overweight Patient presents today for a checkup on his labs such as cholesterol and diabetes because of being over weight.they have been normal previously but he is due for his yearly checks. He is not fasting today we'll go ahead and do that. Patient denies headaches, blurred vision, chest pains, shortness of breath, or weakness. Denies any side effects from medication and is content with current medication.   Constipation Patient comes in today complaining of constipation. A year and a half ago he was diagnosed with some early precancerous polyps. He has been having constipation off and on since that time. He has not gone back to go see the gastroenterology yet I told them that he needs to give them a call and make sure that he is following up on a schedule that they want him to.  Relevant past medical, surgical, family and social history reviewed and updated as indicated. Interim medical history since our last visit reviewed. Allergies and medications reviewed and updated.  Review of Systems  Constitutional: Negative for fever.  HENT: Negative for ear discharge and ear pain.   Eyes: Negative for discharge and visual disturbance.  Respiratory: Negative for shortness of breath and wheezing.   Cardiovascular: Negative for chest pain and leg swelling.  Gastrointestinal: Positive for constipation. Negative for nausea, vomiting, abdominal pain and diarrhea.  Genitourinary: Negative for difficulty urinating.  Musculoskeletal: Negative for back pain and gait problem.  Skin: Negative for rash.  Neurological: Negative for syncope, light-headedness and headaches.  All other  systems reviewed and are negative.   Per HPI unless specifically indicated above     Medication List       This list is accurate as of: 02/13/15 10:46 AM.  Always use your most recent med list.               docusate sodium 100 MG capsule  Commonly known as:  COLACE  Take 1 capsule (100 mg total) by mouth 2 (two) times daily.     naproxen sodium 220 MG tablet  Commonly known as:  ANAPROX  Take 220 mg by mouth as needed.     polyethylene glycol powder powder  Commonly known as:  GLYCOLAX/MIRALAX  Take 17 g by mouth daily as needed.           Objective:    BP 140/76 mmHg  Pulse 52  Temp(Src) 97.2 F (36.2 C) (Oral)  Ht 5' 10.5" (1.791 m)  Wt 204 lb 12.8 oz (92.897 kg)  BMI 28.96 kg/m2  Wt Readings from Last 3 Encounters:  02/13/15 204 lb 12.8 oz (92.897 kg)  01/16/15 198 lb 9.6 oz (90.084 kg)  10/30/14 202 lb 12.8 oz (91.989 kg)    Physical Exam  Constitutional: He is oriented to person, place, and time. He appears well-developed and well-nourished. No distress.  HENT:  Right Ear: External ear normal.  Left Ear: External ear normal.  Nose: Nose normal.  Mouth/Throat: Oropharynx is clear and moist. No oropharyngeal exudate.  Eyes: Conjunctivae and EOM are normal. Pupils are equal, round, and reactive to light.  Right eye exhibits no discharge. No scleral icterus.  Neck: Neck supple. No thyromegaly present.  Cardiovascular: Normal rate, regular rhythm, normal heart sounds and intact distal pulses.   No murmur heard. Pulmonary/Chest: Effort normal and breath sounds normal. No respiratory distress. He has no wheezes.  Abdominal: He exhibits no distension and no mass. There is no tenderness. There is no rebound and no guarding.  Musculoskeletal: Normal range of motion. He exhibits no edema.  Lymphadenopathy:    He has no cervical adenopathy.  Neurological: He is alert and oriented to person, place, and time. Coordination normal.  Skin: Skin is warm and dry. No  rash noted. He is not diaphoretic.  Psychiatric: He has a normal mood and affect. His behavior is normal.  Vitals reviewed.   Results for orders placed or performed in visit on 01/16/15  POCT Influenza A/B  Result Value Ref Range   Influenza A, POC Negative Negative   Influenza B, POC Negative Negative      Assessment & Plan:   Problem List Items Addressed This Visit    None    Visit Diagnoses    Overweight (BMI 25.0-29.9)    -  Primary    Relevant Orders    CMP14+EGFR    Lipid panel    Constipation, unspecified constipation type        Relevant Medications    docusate sodium (COLACE) 100 MG capsule    polyethylene glycol powder (GLYCOLAX/MIRALAX) powder        Follow up plan: Return in about 1 year (around 02/13/2016), or if symptoms worsen or fail to improve.  Counseling provided for all of the vaccine components Orders Placed This Encounter  Procedures  . CMP14+EGFR  . Lipid panel    Caryl Pina, MD [facility] 02/13/2015, 10:46 AM

## 2015-02-13 NOTE — Addendum Note (Signed)
Addended by: Earlene Plater on: 02/13/2015 11:05 AM   Modules accepted: Miquel Dunn

## 2015-02-14 ENCOUNTER — Encounter: Payer: Self-pay | Admitting: *Deleted

## 2015-02-14 ENCOUNTER — Telehealth: Payer: Self-pay | Admitting: Family Medicine

## 2015-02-14 LAB — CMP14+EGFR
ALK PHOS: 78 IU/L (ref 39–117)
ALT: 16 IU/L (ref 0–44)
AST: 16 IU/L (ref 0–40)
Albumin/Globulin Ratio: 1.5 (ref 1.1–2.5)
Albumin: 4 g/dL (ref 3.6–4.8)
BUN/Creatinine Ratio: 22 (ref 10–22)
BUN: 19 mg/dL (ref 8–27)
Bilirubin Total: 0.3 mg/dL (ref 0.0–1.2)
CALCIUM: 9.2 mg/dL (ref 8.6–10.2)
CO2: 23 mmol/L (ref 18–29)
CREATININE: 0.88 mg/dL (ref 0.76–1.27)
Chloride: 101 mmol/L (ref 96–106)
GFR calc Af Amer: 103 mL/min/{1.73_m2} (ref >59)
GFR, EST NON AFRICAN AMERICAN: 90 mL/min/{1.73_m2} (ref >59)
GLOBULIN, TOTAL: 2.7 g/dL (ref 1.5–4.5)
GLUCOSE: 80 mg/dL (ref 65–99)
Potassium: 4.4 mmol/L (ref 3.5–5.2)
SODIUM: 139 mmol/L (ref 134–144)
Total Protein: 6.7 g/dL (ref 6.0–8.5)

## 2015-02-14 LAB — LIPID PANEL
CHOL/HDL RATIO: 4.9 ratio (ref 0.0–5.0)
CHOLESTEROL TOTAL: 190 mg/dL (ref 100–199)
HDL: 39 mg/dL — AB (ref >39)
LDL CALC: 118 mg/dL — AB (ref 0–99)
TRIGLYCERIDES: 166 mg/dL — AB (ref 0–149)
VLDL CHOLESTEROL CAL: 33 mg/dL (ref 5–40)

## 2015-02-14 NOTE — Telephone Encounter (Signed)
Reviewed results with pt .

## 2015-04-07 ENCOUNTER — Encounter: Payer: Self-pay | Admitting: Family Medicine

## 2015-04-07 ENCOUNTER — Ambulatory Visit (INDEPENDENT_AMBULATORY_CARE_PROVIDER_SITE_OTHER): Payer: Commercial Managed Care - HMO | Admitting: Family Medicine

## 2015-04-07 VITALS — BP 122/77 | HR 71 | Temp 97.4°F | Ht 70.5 in | Wt 205.6 lb

## 2015-04-07 DIAGNOSIS — B354 Tinea corporis: Secondary | ICD-10-CM

## 2015-04-07 MED ORDER — CLOTRIMAZOLE-BETAMETHASONE 1-0.05 % EX CREA: 1.0000 "application " | TOPICAL_CREAM | Freq: Two times a day (BID) | CUTANEOUS | Status: DC

## 2015-04-07 NOTE — Progress Notes (Signed)
BP 122/77 mmHg  Pulse 71  Temp(Src) 97.4 F (36.3 C) (Oral)  Ht 5' 10.5" (1.791 m)  Wt 205 lb 9.6 oz (93.26 kg)  BMI 29.07 kg/m2   Subjective:    Patient ID: Todd Berg, male    DOB: Oct 18, 1950, 65 y.o.   MRN: [id number]  HPI: Todd Berg is a 65 y.o. male presenting on 04/07/2015 for Lesion on left shin   HPI Rash Patient has rash or lesion on left shin midway up. The lesion is circular and very itchy and darker in color. It is been there for 4 months and patient says it hasn't changed at all in the past 4 months. It is not painful, just very pruritic. He denies any fevers or chills. He denies any drainage from it. The rash is flat and has not changed color from the maroon color that it is. It is all a solid color.  Relevant past medical, surgical, family and social history reviewed and updated as indicated. Interim medical history since our last visit reviewed. Allergies and medications reviewed and updated.  Review of Systems  Constitutional: Negative for fever.  HENT: Negative for ear discharge and ear pain.   Eyes: Negative for discharge and visual disturbance.  Respiratory: Negative for shortness of breath and wheezing.   Cardiovascular: Negative for chest pain and leg swelling.  Gastrointestinal: Negative for abdominal pain, diarrhea and constipation.  Genitourinary: Negative for difficulty urinating.  Musculoskeletal: Negative for back pain and gait problem.  Skin: Positive for color change and rash.  Neurological: Negative for syncope, light-headedness and headaches.  All other systems reviewed and are negative.   Per HPI unless specifically indicated above     Medication List       This list is accurate as of: 04/07/15  5:06 PM.  Always use your most recent med list.               clotrimazole-betamethasone cream  Commonly known as:  LOTRISONE  Apply 1 application topically 2 (two) times daily.           Objective:    BP 122/77 mmHg   Pulse 71  Temp(Src) 97.4 F (36.3 C) (Oral)  Ht 5' 10.5" (1.791 m)  Wt 205 lb 9.6 oz (93.26 kg)  BMI 29.07 kg/m2  Wt Readings from Last 3 Encounters:  04/07/15 205 lb 9.6 oz (93.26 kg)  02/13/15 204 lb 12.8 oz (92.897 kg)  01/16/15 198 lb 9.6 oz (90.084 kg)    Physical Exam  Constitutional: He is oriented to person, place, and time. He appears well-developed and well-nourished. No distress.  Eyes: Conjunctivae and EOM are normal. Pupils are equal, round, and reactive to light. Right eye exhibits no discharge. No scleral icterus.  Cardiovascular: Normal rate, regular rhythm, normal heart sounds and intact distal pulses.   No murmur heard. Pulmonary/Chest: Effort normal and breath sounds normal. No respiratory distress. He has no wheezes.  Abdominal: He exhibits no distension.  Musculoskeletal: Normal range of motion. He exhibits no edema.  Neurological: He is alert and oriented to person, place, and time. Coordination normal.  Skin: Skin is warm and dry. Rash noted. Rash is macular (3 cm macular lesion on left shin, blanching, maroon in color, has a slight scale to it.). He is not diaphoretic.  Psychiatric: He has a normal mood and affect. His behavior is normal.  Vitals reviewed.     Assessment & Plan:   Problem List Items Addressed This Visit  None    Visit Diagnoses    Tinea corporis    -  Primary    Relevant Medications    clotrimazole-betamethasone (LOTRISONE) cream        Follow up plan: Return if symptoms worsen or fail to improve.  Counseling provided for all of the vaccine components No orders of the defined types were placed in this encounter.    Caryl Pina, MD [facility] 04/07/2015, 5:06 PM

## 2015-04-18 ENCOUNTER — Telehealth: Payer: Self-pay | Admitting: Family Medicine

## 2015-04-18 DIAGNOSIS — Z1211 Encounter for screening for malignant neoplasm of colon: Secondary | ICD-10-CM

## 2015-07-16 DIAGNOSIS — R1030 Lower abdominal pain, unspecified: Secondary | ICD-10-CM | POA: Diagnosis not present

## 2015-07-16 DIAGNOSIS — Z87891 Personal history of nicotine dependence: Secondary | ICD-10-CM | POA: Diagnosis not present

## 2015-07-16 DIAGNOSIS — K921 Melena: Secondary | ICD-10-CM | POA: Diagnosis not present

## 2015-07-16 DIAGNOSIS — D649 Anemia, unspecified: Secondary | ICD-10-CM | POA: Diagnosis not present

## 2015-07-16 DIAGNOSIS — G8929 Other chronic pain: Secondary | ICD-10-CM | POA: Diagnosis not present

## 2015-07-16 DIAGNOSIS — M545 Low back pain: Secondary | ICD-10-CM | POA: Diagnosis not present

## 2015-07-16 DIAGNOSIS — J9811 Atelectasis: Secondary | ICD-10-CM | POA: Diagnosis not present

## 2015-07-16 DIAGNOSIS — K920 Hematemesis: Secondary | ICD-10-CM | POA: Diagnosis not present

## 2015-07-16 DIAGNOSIS — K922 Gastrointestinal hemorrhage, unspecified: Secondary | ICD-10-CM | POA: Diagnosis not present

## 2015-07-16 DIAGNOSIS — Z85038 Personal history of other malignant neoplasm of large intestine: Secondary | ICD-10-CM | POA: Diagnosis not present

## 2015-07-16 DIAGNOSIS — Z832 Family history of diseases of the blood and blood-forming organs and certain disorders involving the immune mechanism: Secondary | ICD-10-CM | POA: Diagnosis not present

## 2015-07-16 DIAGNOSIS — Z825 Family history of asthma and other chronic lower respiratory diseases: Secondary | ICD-10-CM | POA: Diagnosis not present

## 2015-07-21 ENCOUNTER — Telehealth: Payer: Self-pay | Admitting: Family Medicine

## 2015-07-22 NOTE — Telephone Encounter (Signed)
Spoke with someone at the home that says he will return at 2pm. Will call back then

## 2015-07-29 DIAGNOSIS — K921 Melena: Secondary | ICD-10-CM | POA: Diagnosis not present

## 2015-08-12 DIAGNOSIS — Z87891 Personal history of nicotine dependence: Secondary | ICD-10-CM | POA: Diagnosis not present

## 2015-08-12 DIAGNOSIS — Z79899 Other long term (current) drug therapy: Secondary | ICD-10-CM | POA: Diagnosis not present

## 2015-08-12 DIAGNOSIS — K922 Gastrointestinal hemorrhage, unspecified: Secondary | ICD-10-CM | POA: Diagnosis not present

## 2015-08-12 DIAGNOSIS — J449 Chronic obstructive pulmonary disease, unspecified: Secondary | ICD-10-CM | POA: Diagnosis not present

## 2015-08-12 DIAGNOSIS — Z86010 Personal history of colonic polyps: Secondary | ICD-10-CM | POA: Diagnosis not present

## 2015-08-12 DIAGNOSIS — Z836 Family history of other diseases of the respiratory system: Secondary | ICD-10-CM | POA: Diagnosis not present

## 2015-08-12 DIAGNOSIS — Z85038 Personal history of other malignant neoplasm of large intestine: Secondary | ICD-10-CM | POA: Diagnosis not present

## 2015-08-12 DIAGNOSIS — K921 Melena: Secondary | ICD-10-CM | POA: Diagnosis not present

## 2015-08-12 DIAGNOSIS — K219 Gastro-esophageal reflux disease without esophagitis: Secondary | ICD-10-CM | POA: Diagnosis not present

## 2015-08-12 HISTORY — PX: COLONOSCOPY: SHX174

## 2015-09-03 ENCOUNTER — Encounter: Payer: Self-pay | Admitting: Family Medicine

## 2015-09-13 ENCOUNTER — Ambulatory Visit (INDEPENDENT_AMBULATORY_CARE_PROVIDER_SITE_OTHER): Payer: Commercial Managed Care - HMO | Admitting: Family Medicine

## 2015-09-13 VITALS — BP 139/71 | HR 56 | Temp 97.5°F | Ht 70.5 in | Wt 203.4 lb

## 2015-09-13 DIAGNOSIS — H8110 Benign paroxysmal vertigo, unspecified ear: Secondary | ICD-10-CM

## 2015-09-13 DIAGNOSIS — Z85048 Personal history of other malignant neoplasm of rectum, rectosigmoid junction, and anus: Secondary | ICD-10-CM | POA: Diagnosis not present

## 2015-09-13 NOTE — Progress Notes (Signed)
   HPI  Patient presents today with elevated blood pressure and dizziness.  His symptoms have resolved today.  He states that for the last 2 days he's had recurrent dizziness with room spinning sensation, worsened by rapid head movement. He states that his blood pressure was checked by a friend and then again at [facility] and was "in the red" both times. He does not remember any numbers.  He denies any chest pain, weakness, numbness, shortness of breath.   He has a history of rectal cancer, his last colonoscopy was completely clear.  He denies any headache as well  PMH: Smoking status noted ROS: Per HPI  Objective: BP 139/71 (BP Location: Left Arm, Patient Position: Sitting, Cuff Size: Normal)   Pulse (!) 56   Temp 97.5 F (36.4 C) (Oral)   Ht 5' 10.5" (1.791 m)   Wt 203 lb 6.4 oz (92.3 kg)   BMI 28.77 kg/m  Gen: NAD, alert, cooperative with exam HEENT: NCAT, EOMI, PERRL CV: RRR, good S1/S2, no murmur Resp: CTABL, no wheezes, non-labored Ext: No edema, warm Neuro: Alert and oriented, strength 5/5 and sensation intact in all 4 extremities, no Romberg sign, no pronator drift, normal gait 2+ patellar tendon reflexes bilaterally, cranial nerves II through XII intact with subtle rightward tongue deviation  Assessment and plan:  # Benign vertigo Now resolved, I suspect that his blood pressure was elevated due to the stress from the dizziness sensation. Discussed very low threshold for return and MRI given history of rectal cancer. His neurologic exam is very reassuring today, he does have very subtle rightward tongue deviation, however with lack of any other neurologic symptoms I believe he has unlikely to have any central lesions. Given basic information on vertigo, return to clinic with any concerns.   Laroy Apple, MD [facility] 09/13/2015, 10:25 AM

## 2015-09-13 NOTE — Patient Instructions (Signed)
Great to meet you!  If you have recurrent dizziness, headaches, or notice any weakness or numbness anywhere please call us for another appointment.  It looks like he had vertigo which caused her blood pressure to go up due to stress. I am glad that your vertigo has resolved.  Below there is some information about vertigo.    Benign Positional Vertigo Vertigo is the feeling that you or your surroundings are moving when they are not. Benign positional vertigo is the most common form of vertigo. The cause of this condition is not serious (is benign). This condition is triggered by certain movements and positions (is positional). This condition can be dangerous if it occurs while you are doing something that could endanger you or others, such as driving.  CAUSES In many cases, the cause of this condition is not known. It may be caused by a disturbance in an area of the inner ear that helps your brain to sense movement and balance. This disturbance can be caused by a viral infection (labyrinthitis), head injury, or repetitive motion. RISK FACTORS This condition is more likely to develop in:  Women.  People who are 53 years of age or older. SYMPTOMS Symptoms of this condition usually happen when you move your head or your eyes in different directions. Symptoms may start suddenly, and they usually last for less than a minute. Symptoms may include:  Loss of balance and falling.  Feeling like you are spinning or moving.  Feeling like your surroundings are spinning or moving.  Nausea and vomiting.  Blurred vision.  Dizziness.  Involuntary eye movement (nystagmus). Symptoms can be mild and cause only slight annoyance, or they can be severe and interfere with daily life. Episodes of benign positional vertigo may return (recur) over time, and they may be triggered by certain movements. Symptoms may improve over time. DIAGNOSIS This condition is usually diagnosed by medical history and a  physical exam of the head, neck, and ears. You may be referred to a health care provider who specializes in ear, nose, and throat (ENT) problems (otolaryngologist) or a provider who specializes in disorders of the nervous system (neurologist). You may have additional testing, including:  MRI.  A CT scan.  Eye movement tests. Your health care provider may ask you to change positions quickly while he or she watches you for symptoms of benign positional vertigo, such as nystagmus. Eye movement may be tested with an electronystagmogram (ENG), caloric stimulation, the Dix-Hallpike test, or the roll test.  An electroencephalogram (EEG). This records electrical activity in your brain.  Hearing tests. TREATMENT Usually, your health care provider will treat this by moving your head in specific positions to adjust your inner ear back to normal. Surgery may be needed in severe cases, but this is rare. In some cases, benign positional vertigo may resolve on its own in 2-4 weeks. HOME CARE INSTRUCTIONS Safety  Move slowly.Avoid sudden body or head movements.  Avoid driving.  Avoid operating heavy machinery.  Avoid doing any tasks that would be dangerous to you or others if a vertigo episode would occur.  If you have trouble walking or keeping your balance, try using a cane for stability. If you feel dizzy or unstable, sit down right away.  Return to your normal activities as told by your health care provider. Ask your health care provider what activities are safe for you. General Instructions  Take over-the-counter and prescription medicines only as told by your health care provider.  Avoid certain  positions or movements as told by your health care provider.  Drink enough fluid to keep your urine clear or pale yellow.  Keep all follow-up visits as told by your health care provider. This is important. SEEK MEDICAL CARE IF:  You have a fever.  Your condition gets worse or you develop new  symptoms.  Your family or friends notice any behavioral changes.  Your nausea or vomiting gets worse.  You have numbness or a "pins and needles" sensation. SEEK IMMEDIATE MEDICAL CARE IF:  You have difficulty speaking or moving.  You are always dizzy.  You faint.  You develop severe headaches.  You have weakness in your legs or arms.  You have changes in your hearing or vision.  You develop a stiff neck.  You develop sensitivity to light.   This information is not intended to replace advice given to you by your health care provider. Make sure you discuss any questions you have with your health care provider.   Document Released: 10/05/2005 Document Revised: 09/18/2014 Document Reviewed: 04/22/2014 Elsevier Interactive Patient Education [employer].

## 2015-09-27 ENCOUNTER — Ambulatory Visit (INDEPENDENT_AMBULATORY_CARE_PROVIDER_SITE_OTHER): Payer: Commercial Managed Care - HMO | Admitting: Pediatrics

## 2015-09-27 VITALS — BP 136/74 | HR 50 | Temp 97.0°F | Ht 70.5 in | Wt 202.0 lb

## 2015-09-27 DIAGNOSIS — M542 Cervicalgia: Secondary | ICD-10-CM

## 2015-09-27 NOTE — Progress Notes (Signed)
  Subjective:   Patient ID: Todd Berg, male    DOB: [birth year], 65 y.o.   MRN: [id number] CC: Neck Pain (left sided neck pain and headache x 3-4 weeks. Has not taken anything OTC but has applied a topical "salve" that helps a little. )  HPI: Todd Berg is a 65 y.o. male presenting for Neck Pain (left sided neck pain and headache x 3-4 weeks. Has not taken anything OTC but has applied a topical "salve" that helps a little. )  Doesn't remember doing anything unusual when pain started Says it about took him to his knees at times when he moves the wrong way Woke up with the pain Sometimes cant lay down because hurts Moving a certain a way makes it worse Cant turn his head to the L very far because of the pain Pain in R arm sometimes, hands and fingers lock up L arm sometimes hurts, no shooting pains in either arm He says arm pain has been going on for a long itme, seems more related to arthritis and what he has done with his arms over time working than his neck pain No numbness, tingling in hands/arms  Relevant past medical, surgical, family and social history reviewed. Allergies and medications reviewed and updated. History  Smoking Status  . Former Smoker  . Start date: 12/30/1964  . Quit date: 06/19/2009  Smokeless Tobacco  . Former Systems developer  . Types: Snuff  . Quit date: 09/26/2005   ROS: Per HPI   Objective:    BP (!) 150/78 (BP Location: Left Arm, Patient Position: Sitting, Cuff Size: Small)   Pulse (!) 50   Temp 97 F (36.1 C) (Oral)   Ht 5' 10.5" (1.791 m)   Wt 202 lb (91.6 kg)   BMI 28.57 kg/m   Wt Readings from Last 3 Encounters:  09/27/15 202 lb (91.6 kg)  09/13/15 203 lb 6.4 oz (92.3 kg)  04/07/15 205 lb 9.6 oz (93.3 kg)    Gen: NAD, alert, cooperative with exam, NCAT EYES: EOMI, no conjunctival injection, or no icterus ENT:  TMs pearly gray b/l, OP without erythema LYMPH: no cervical LAD CV: NRRR, normal S1/S2 Resp: CTABL, no wheezes, normal WOB Neuro:  Alert and oriented, strength equal b/l UE and LE, coordination grossly normal MSK: decreased ROM with turning head to the L, able to get to about 5-10 degrees to the L. Minimally able to tilt head to either R or L. No point tenderness over cervical spine. No exacerbation of arm pain with neck ROM.  Assessment & Plan:  Todd Berg was seen today for neck pain. Will treat for neck spasm with conservative measures, NSAIDs, heat, ice, gentle ROM. If no improvement needs to be seen.  Diagnoses and all orders for this visit:  Neck pain   Follow up plan: prn Assunta Found, MD [facility]

## 2015-09-27 NOTE — Patient Instructions (Addendum)
Don't do any exercises or stretches that hurt your neck Take ibuprofen 600mg  2-3 times a day Use ice and heat on neck If not improving in next 2 weeks let us know    Neck bending - Tilt the head forward and try to touch your chin to your neck. Hold for a few seconds, breathe in gradually, and exhale slowly with each exercise. Exhaling with the movement helps relax the muscles. Repeat 10 to 15 times. Relax the neck and back muscles with each neck bend.  ?Shoulder rolls - In the sitting or standing position, hold the arms at the side with the elbows bent. Try to pinch the shoulder blades together. Roll the shoulders backwards 10 to 15 times, moving in a rhythmic, rowing motion. Rest. Roll the shoulders forwards 10 to 15 times.  ?Neck rotation - Slowly look to the right. Hold for a few seconds. Look to the center. Rest for a few seconds between movements. Repeat 10 to 15 times. Perform on the left side.  ?Neck tilting - Look straight forward, then tilt the top of the head to the right, trying to touch your right ear to the right shoulder (without moving the shoulder). Hold in place for a few seconds. Return the head to the center. Repeat 10 to 15 times. Repeat on the left side.  ?Vertical shoulder stretches - In the sitting or standing position, use the right hand to hold the left wrist and pull the arm (and shoulder) up and over the head, towards the right. Hold for five seconds. Keep the left shoulder and back muscles relaxed. Rest and repeat 10 to 15 times. Repeat using left hand to hold right wrist.  ?Upper back stretches - In the standing position, lean forward from the hips and rest both hands on a low counter with the elbows straight. Exhale, relax the neck and shoulders, and allow the head to fall forward as you round the upper back. This requires the shoulder blades to spread apart and mimics the motion of a cat stretching its back. Exhaling with the motion helps to relax the muscles. Return  to the standing position with hands on a counter. Repeat slowly 10 times.  ?Upper back side bends - Stand or sit up straight in a chair. Bend the trunk to the right while holding the hands together slightly behind the neck for support. Hold for five seconds, and then return to center. Repeat to the left. Repeat 10 to 15 times. Keep the lower back straight or supported against a chair.

## 2015-11-11 ENCOUNTER — Ambulatory Visit (INDEPENDENT_AMBULATORY_CARE_PROVIDER_SITE_OTHER): Payer: Commercial Managed Care - HMO | Admitting: Family

## 2015-11-11 ENCOUNTER — Telehealth: Payer: Self-pay | Admitting: Family

## 2015-11-11 ENCOUNTER — Encounter: Payer: Self-pay | Admitting: Family

## 2015-11-11 VITALS — BP 127/68 | HR 62 | Temp 97.7°F | Ht 70.5 in | Wt 199.0 lb

## 2015-11-11 DIAGNOSIS — J209 Acute bronchitis, unspecified: Secondary | ICD-10-CM

## 2015-11-11 DIAGNOSIS — L989 Disorder of the skin and subcutaneous tissue, unspecified: Secondary | ICD-10-CM | POA: Diagnosis not present

## 2015-11-11 MED ORDER — AZITHROMYCIN 250 MG PO TABS: ORAL_TABLET | ORAL | 0 refills | Status: DC

## 2015-11-11 MED ORDER — PREDNISONE 10 MG (21) PO TBPK: ORAL_TABLET | ORAL | 0 refills | Status: DC

## 2015-11-11 NOTE — Progress Notes (Signed)
Subjective:    Patient ID: Todd Berg, male    DOB: 08-26-1950, 65 y.o.   MRN: [id number]  Cough  This is a new problem. The current episode started in the past 7 days. The problem has been gradually worsening. The problem occurs every few minutes. The cough is productive of purulent sputum and productive of sputum. Associated symptoms include headaches, myalgias, postnasal drip, shortness of breath and wheezing. Pertinent negatives include no chills, ear congestion, ear pain, fever, nasal congestion or sore throat. The symptoms are aggravated by lying down. He has tried rest and OTC cough suppressant for the symptoms. The treatment provided moderate relief. There is no history of asthma or COPD.  Headache   Associated symptoms include coughing. Pertinent negatives include no ear pain, fever or sore throat.   PT also complaining of skin lesion on left shin that has been there for years and wants a referral to derm.    Review of Systems  Constitutional: Negative for chills and fever.  HENT: Positive for postnasal drip. Negative for ear pain and sore throat.   Respiratory: Positive for cough, shortness of breath and wheezing.   Musculoskeletal: Positive for myalgias.  Neurological: Positive for headaches.  All other systems reviewed and are negative.      Objective:   Physical Exam  Constitutional: He is oriented to person, place, and time. He appears well-developed and well-nourished. No distress.  HENT:  Head: Normocephalic.  Right Ear: External ear normal.  Left Ear: External ear normal.  Mouth/Throat: Oropharynx is clear and moist.  Nasal passage erythemas with mild swelling   Eyes: Pupils are equal, round, and reactive to light. Right eye exhibits no discharge. Left eye exhibits no discharge.  Neck: Normal range of motion. Neck supple. No thyromegaly present.  Cardiovascular: Normal rate, regular rhythm, normal heart sounds and intact distal pulses.   No murmur  heard. Pulmonary/Chest: Effort normal. No respiratory distress. He has rhonchi in the right lower field and the left lower field.  Coarse intermittent cough   Abdominal: Soft. Bowel sounds are normal. He exhibits no distension. There is no tenderness.  Musculoskeletal: Normal range of motion. He exhibits no edema or tenderness.  Neurological: He is alert and oriented to person, place, and time. He has normal reflexes. No cranial nerve deficit.  Skin: Skin is warm and dry. Rash noted. No erythema.  Erythemas skin lesion on left shin   Psychiatric: He has a normal mood and affect. His behavior is normal. Judgment and thought content normal.  Vitals reviewed.     BP 127/68   Pulse 62   Temp 97.7 F (36.5 C) (Oral)   Ht 5' 10.5" (1.791 m)   Wt 199 lb (90.3 kg)   BMI 28.15 kg/m      Assessment & Plan:  1. Acute bronchitis, unspecified organism -- Take meds as prescribed - Use a cool mist humidifier  -Use saline nose sprays frequently -Saline irrigations of the nose can be very helpful if done frequently.  * 4X daily for 1 week*  * Use of a nettie pot can be helpful with this. Follow directions with this* -Force fluids -For any cough or congestion  Use plain Mucinex- regular strength or max strength is fine   * Children- consult with Pharmacist for dosing -For fever or aces or pains- take tylenol or ibuprofen appropriate for age and weight.  * for fevers greater than 101 orally you may alternate ibuprofen and tylenol every  3 hours. -  Throat lozenges if help - Ambulatory referral to Dermatology - predniSONE (STERAPRED UNI-PAK 21 TAB) 10 MG (21) TBPK tablet; Use as directed  Dispense: 21 tablet; Refill: 0  2. Skin lesion - azithromycin (ZITHROMAX) 250 MG tablet; Take 500 mg once, then 250 mg for four days  Dispense: 6 tablet; Refill: 0  Evelina Dun, FNP

## 2015-11-11 NOTE — Telephone Encounter (Signed)
Note ready for pick up

## 2015-11-11 NOTE — Patient Instructions (Signed)

## 2015-11-12 ENCOUNTER — Telehealth: Payer: Self-pay | Admitting: Family Medicine

## 2015-11-12 NOTE — Telephone Encounter (Signed)
Aware. 

## 2015-11-13 NOTE — Telephone Encounter (Signed)
Aware, work note will not be extended.

## 2015-11-13 NOTE — Telephone Encounter (Signed)
Can not write pt out of work for two weeks. IF he continues to be sick he will need to be seen.

## 2015-11-14 ENCOUNTER — Ambulatory Visit: Payer: Commercial Managed Care - HMO | Admitting: Family Medicine

## 2015-11-17 ENCOUNTER — Telehealth: Payer: Self-pay | Admitting: Family

## 2015-11-17 ENCOUNTER — Telehealth: Payer: Self-pay | Admitting: Family Medicine

## 2015-11-17 ENCOUNTER — Encounter: Payer: Self-pay | Admitting: Family Medicine

## 2015-11-17 MED ORDER — DOXYCYCLINE HYCLATE 100 MG PO TABS: 100.0000 mg | ORAL_TABLET | Freq: Two times a day (BID) | ORAL | 0 refills | Status: DC

## 2015-11-17 NOTE — Telephone Encounter (Signed)
Doxycyline Prescription sent to pharmacy

## 2015-11-17 NOTE — Telephone Encounter (Signed)
Pt aware.

## 2015-11-18 NOTE — Telephone Encounter (Signed)
Patient states that he does not need to schedule appointmnet at this time and would call us back.

## 2015-11-24 ENCOUNTER — Telehealth: Payer: Self-pay | Admitting: Family Medicine

## 2015-11-24 NOTE — Telephone Encounter (Signed)
Pt finished both antibiotics Will try Mucinex otc Will call back if sxs persist or worsen

## 2015-12-02 DIAGNOSIS — L308 Other specified dermatitis: Secondary | ICD-10-CM | POA: Diagnosis not present

## 2015-12-02 DIAGNOSIS — D485 Neoplasm of uncertain behavior of skin: Secondary | ICD-10-CM | POA: Diagnosis not present

## 2015-12-02 DIAGNOSIS — L309 Dermatitis, unspecified: Secondary | ICD-10-CM | POA: Diagnosis not present

## 2015-12-22 DIAGNOSIS — L309 Dermatitis, unspecified: Secondary | ICD-10-CM | POA: Diagnosis not present

## 2016-01-13 ENCOUNTER — Encounter: Payer: Self-pay | Admitting: Family Medicine

## 2016-01-13 ENCOUNTER — Ambulatory Visit (INDEPENDENT_AMBULATORY_CARE_PROVIDER_SITE_OTHER): Payer: Medicare HMO | Admitting: Family Medicine

## 2016-01-13 VITALS — BP 137/77 | HR 67 | Temp 97.0°F | Ht 70.5 in | Wt 205.0 lb

## 2016-01-13 DIAGNOSIS — M546 Pain in thoracic spine: Secondary | ICD-10-CM | POA: Diagnosis not present

## 2016-01-13 DIAGNOSIS — M79604 Pain in right leg: Secondary | ICD-10-CM | POA: Diagnosis not present

## 2016-01-13 DIAGNOSIS — M79605 Pain in left leg: Secondary | ICD-10-CM | POA: Diagnosis not present

## 2016-01-13 MED ORDER — MELOXICAM 15 MG PO TABS: 15.0000 mg | ORAL_TABLET | Freq: Every day | ORAL | 5 refills | Status: DC

## 2016-01-13 MED ORDER — CYCLOBENZAPRINE HCL 10 MG PO TABS: 10.0000 mg | ORAL_TABLET | Freq: Every day | ORAL | 0 refills | Status: DC

## 2016-01-13 NOTE — Progress Notes (Signed)
Subjective:  Patient ID: Todd Berg, male    DOB: [birth year]  Age: 66 y.o. MRN: [id number]  CC: Shoulder Pain (pt here today c/o left shoulder pain and aching in both lower legs especially at night)   HPI Todd Berg presents for moderate discomfort on left rhomboideous medial to scapula. NKI. Onset 2 mos ago. Slowly worsening. Now somewhat painful to  Fully abduct overhead. Chronic ache in BLE from knees to ankles. Unchanged in severity recently, intermittent, moderate intensity,  but interferes with comfortable ambulation.  History Todd Berg has a past medical history of Cancer ([place]) (2015).   He has a past surgical history that includes Rectal surgery.   His family history includes COPD in his mother; Stroke in his father.He reports that he quit smoking about 6 years ago. He started smoking about 51 years ago. He quit smokeless tobacco use about 10 years ago. His smokeless tobacco use included Snuff. He reports that he does not drink alcohol or use drugs.  No current outpatient prescriptions on file prior to visit.   No current facility-administered medications on file prior to visit.     ROS Review of Systems  Constitutional: Negative for chills, diaphoresis, fever and unexpected weight change.  HENT: Negative for congestion, hearing loss, rhinorrhea and sore throat.   Eyes: Negative for visual disturbance.  Respiratory: Negative for cough and shortness of breath.   Cardiovascular: Negative for chest pain.  Gastrointestinal: Negative for abdominal pain, constipation and diarrhea.  Genitourinary: Negative for dysuria and flank pain.  Musculoskeletal: Positive for arthralgias, back pain and joint swelling.  Skin: Negative for rash.  Neurological: Negative for dizziness and headaches.  Psychiatric/Behavioral: Negative for dysphoric mood and sleep disturbance.    Objective:  BP 137/77   Pulse 67   Temp 97 F (36.1 C) (Oral)   Ht 5' 10.5" (1.791 m)   Wt 205 lb (93  kg)   BMI 29.00 kg/m   Physical Exam  Constitutional: He appears well-developed and well-nourished.  HENT:  Head: Normocephalic and atraumatic.  Right Ear: Tympanic membrane and external ear normal. No decreased hearing is noted.  Left Ear: Tympanic membrane and external ear normal. No decreased hearing is noted.  Mouth/Throat: No oropharyngeal exudate or posterior oropharyngeal erythema.  Eyes: Pupils are equal, round, and reactive to light.  Neck: Normal range of motion. Neck supple.  Cardiovascular: Normal rate and regular rhythm.   No murmur heard. Pulmonary/Chest: Breath sounds normal. No respiratory distress.  Abdominal: Soft. Bowel sounds are normal. He exhibits no mass. There is no tenderness.  Musculoskeletal: Normal range of motion. He exhibits tenderness (left thoracic paraspinous spasm is mildly tender).  Vitals reviewed.   Assessment & Plan:   Valentin was seen today for shoulder pain.  Diagnoses and all orders for this visit:  Acute left-sided thoracic back pain  Pain in both lower extremities  Other orders -     cyclobenzaprine (FLEXERIL) 10 MG tablet; Take 1 tablet (10 mg total) by mouth at bedtime. For back and shoulder  Muscle pain -     meloxicam (MOBIC) 15 MG tablet; Take 1 tablet (15 mg total) by mouth daily. For joint and muscle pain   I have discontinued Todd Berg's predniSONE, azithromycin, and doxycycline. I am also having him start on cyclobenzaprine and meloxicam.  Meds ordered this encounter  Medications  . cyclobenzaprine (FLEXERIL) 10 MG tablet    Sig: Take 1 tablet (10 mg total) by mouth at bedtime. For back and shoulder  Muscle pain    Dispense:  90 tablet    Refill:  0  . meloxicam (MOBIC) 15 MG tablet    Sig: Take 1 tablet (15 mg total) by mouth daily. For joint and muscle pain    Dispense:  30 tablet    Refill:  5     Follow-up: Return in about 1 month (around 02/13/2016), or if symptoms worsen or fail to improve, for Pain.  Claretta Fraise, M.D.

## 2016-02-11 ENCOUNTER — Encounter: Payer: Self-pay | Admitting: Family Medicine

## 2016-02-11 ENCOUNTER — Ambulatory Visit (INDEPENDENT_AMBULATORY_CARE_PROVIDER_SITE_OTHER): Payer: Medicare HMO | Admitting: Family Medicine

## 2016-02-11 VITALS — BP 129/85 | HR 88 | Temp 96.8°F | Ht 70.5 in | Wt 201.8 lb

## 2016-02-11 DIAGNOSIS — C4490 Unspecified malignant neoplasm of skin, unspecified: Secondary | ICD-10-CM | POA: Diagnosis not present

## 2016-02-11 DIAGNOSIS — B372 Candidiasis of skin and nail: Secondary | ICD-10-CM

## 2016-02-11 MED ORDER — CLOTRIMAZOLE-BETAMETHASONE 1-0.05 % EX CREA: 1.0000 "application " | TOPICAL_CREAM | Freq: Two times a day (BID) | CUTANEOUS | 0 refills | Status: DC

## 2016-02-11 MED ORDER — FLUCONAZOLE 150 MG PO TABS: 150.0000 mg | ORAL_TABLET | Freq: Once | ORAL | 0 refills | Status: AC

## 2016-02-11 NOTE — Progress Notes (Signed)
BP (!) 157/86   Pulse 77   Temp (!) 96.8 F (36 C) (Oral)   Ht 5' 10.5" (1.791 m)   Wt 201 lb 12.8 oz (91.5 kg)   BMI 28.55 kg/m    Subjective:    Patient ID: Todd Berg, male    DOB: 1951/01/09, 66 y.o.   MRN: [id number]  HPI: Todd Berg is a 66 y.o. male presenting on 02/11/2016 for Rash (on legs and buttocks with itching)   HPI Skin cancer Patient is coming in today for a follow-up for his skin cancer. He was diagnosed up by dermatologist with skin cancer on his anterior left shin and has been doing a topical agent for it. He feels like he is not getting better like to go see a different dermatologist like a second opinion. He cannot recall what type of skin cancer was and we do not have any records from that dermatologist.  Rash Patient is complaining of a rash is very pruritic events on his buttocks and groin region. He said the rash started for at least the past few weeks. He says he has been using some topical salves that are over-the-counter on it but it seems to be spreading and worsening. The rash comes and splotches and is spreading over both buttocks and the lateral aspect of his legs and under his abdomen and in his groin and anterior legs. It is worse near the buttocks. He denies any fevers or chills or redness or warmth or drainage.  Relevant past medical, surgical, family and social history reviewed and updated as indicated. Interim medical history since our last visit reviewed. Allergies and medications reviewed and updated.  Review of Systems  Constitutional: Negative for chills and fever.  Respiratory: Negative for shortness of breath and wheezing.   Cardiovascular: Negative for chest pain and leg swelling.  Musculoskeletal: Negative for back pain and gait problem.  Skin: Positive for rash. Negative for color change.  All other systems reviewed and are negative.   Per HPI unless specifically indicated above      Objective:    BP (!) 157/86    Pulse 77   Temp (!) 96.8 F (36 C) (Oral)   Ht 5' 10.5" (1.791 m)   Wt 201 lb 12.8 oz (91.5 kg)   BMI 28.55 kg/m   Wt Readings from Last 3 Encounters:  02/11/16 201 lb 12.8 oz (91.5 kg)  01/13/16 205 lb (93 kg)  11/11/15 199 lb (90.3 kg)    Physical Exam  Constitutional: He is oriented to person, place, and time. He appears well-developed and well-nourished. No distress.  Eyes: Conjunctivae are normal. Right eye exhibits no discharge. Left eye exhibits no discharge. No scleral icterus.  Cardiovascular: Normal rate, regular rhythm, normal heart sounds and intact distal pulses.   No murmur heard. Pulmonary/Chest: Effort normal and breath sounds normal. No respiratory distress. He has no wheezes. He has no rales.  Musculoskeletal: Normal range of motion. He exhibits no edema.  Neurological: He is alert and oriented to person, place, and time. Coordination normal.  Skin: Skin is warm and dry. Lesion (Mottled slightly pink flat lesion on anterior left shin about the size of a silver dollar. Patient has been told that it was skin cancer and has been doing a topical agent but wants a new referral.) and rash noted. Rash is macular (Macular rash with slight scale scattered over her buttocks and in groin. No signs of erythema or warmth or drainage. Multiple satellite  lesions.). He is not diaphoretic.  Psychiatric: He has a normal mood and affect. His behavior is normal.  Nursing note and vitals reviewed.     Assessment & Plan:   Problem List Items Addressed This Visit    None    Visit Diagnoses    Skin cancer    -  Primary   Patient has seen a dermatologist for skin cancer on anterior left shin, he wants a second opinion.   Relevant Medications   fluconazole (DIFLUCAN) 150 MG tablet   Other Relevant Orders   Ambulatory referral to Dermatology   Yeast dermatitis       Rash with satellite lesions surrounding by wax and in groin and underneath her abdomen.   Relevant Medications    fluconazole (DIFLUCAN) 150 MG tablet   clotrimazole-betamethasone (LOTRISONE) cream       Follow up plan: Return if symptoms worsen or fail to improve.  Counseling provided for all of the vaccine components Orders Placed This Encounter  Procedures  . Ambulatory referral to Dermatology    Caryl Pina, MD [facility] 02/11/2016, 1:31 PM

## 2016-03-18 ENCOUNTER — Other Ambulatory Visit: Payer: Self-pay

## 2016-03-18 DIAGNOSIS — D229 Melanocytic nevi, unspecified: Secondary | ICD-10-CM | POA: Diagnosis not present

## 2016-03-18 DIAGNOSIS — L821 Other seborrheic keratosis: Secondary | ICD-10-CM | POA: Diagnosis not present

## 2016-03-18 DIAGNOSIS — D492 Neoplasm of unspecified behavior of bone, soft tissue, and skin: Secondary | ICD-10-CM | POA: Diagnosis not present

## 2016-03-18 DIAGNOSIS — L817 Pigmented purpuric dermatosis: Secondary | ICD-10-CM | POA: Diagnosis not present

## 2016-05-21 ENCOUNTER — Ambulatory Visit (INDEPENDENT_AMBULATORY_CARE_PROVIDER_SITE_OTHER): Payer: Medicare HMO | Admitting: Nurse Practitioner

## 2016-05-21 ENCOUNTER — Ambulatory Visit: Payer: Medicare HMO

## 2016-05-21 ENCOUNTER — Encounter: Payer: Self-pay | Admitting: Nurse Practitioner

## 2016-05-21 VITALS — BP 150/75 | HR 52 | Temp 96.8°F | Ht 70.0 in | Wt 204.0 lb

## 2016-05-21 DIAGNOSIS — R42 Dizziness and giddiness: Secondary | ICD-10-CM

## 2016-05-21 LAB — FINGERSTICK HEMOGLOBIN: Hemoglobin: 14.7 g/dL (ref 12.6–17.7)

## 2016-05-21 MED ORDER — MECLIZINE HCL 25 MG PO TABS: 25.0000 mg | ORAL_TABLET | Freq: Three times a day (TID) | ORAL | 0 refills | Status: DC | PRN

## 2016-05-21 NOTE — Progress Notes (Signed)
   Subjective:    Patient ID: Todd Berg, male    DOB: September 02, 1950, 66 y.o.   MRN: [id number]  HPI Patient comes in by hisself. He is c/o dizziness that started a couple of days ago. He has been anemicin  The past and had the same symptoms.    Review of Systems  Constitutional: Positive for fatigue. Negative for appetite change, chills and fever.  HENT: Negative.   Respiratory: Negative.   Cardiovascular: Negative.   Genitourinary: Negative.   Neurological: Positive for dizziness.  Psychiatric/Behavioral: Negative.   All other systems reviewed and are negative.      Objective:   Physical Exam  Constitutional: He is oriented to person, place, and time. He appears well-developed and well-nourished. No distress.  Cardiovascular: Normal rate and regular rhythm.   Pulmonary/Chest: Effort normal and breath sounds normal.  Abdominal: Soft. Bowel sounds are normal.  Neurological: He is alert and oriented to person, place, and time.  Skin: Skin is warm.  Psychiatric: He has a normal mood and affect. His behavior is normal. Judgment and thought content normal.  BP (!) 154/74   Pulse (!) 51   Temp (!) 96.8 F (36 C) (Oral)   Ht 5\' 10"  (1.778 m)   Wt 204 lb (92.5 kg)   BMI 29.27 kg/m   HGB 14.7    Assessment & Plan:  1. Dizziness force fluids Needs to follow up with PCP - Fingerstick Hemoglobin - meclizine (ANTIVERT) 25 MG tablet; Take 1 tablet (25 mg total) by mouth 3 (three) times daily as needed for dizziness.  Dispense: 30 tablet; Refill: 0   Mary-Margaret Hassell Done, FNP

## 2016-05-21 NOTE — Patient Instructions (Signed)

## 2016-06-09 ENCOUNTER — Inpatient Hospital Stay (HOSPITAL_COMMUNITY): Payer: Medicare HMO | Admitting: Anesthesiology

## 2016-06-09 ENCOUNTER — Encounter (HOSPITAL_COMMUNITY)
Admission: EM | Disposition: A | Discharge status: Home / Self Care | Payer: Self-pay | Source: Other Acute Inpatient Hospital | Attending: Internal Medicine

## 2016-06-09 ENCOUNTER — Encounter (HOSPITAL_COMMUNITY): Payer: Self-pay

## 2016-06-09 ENCOUNTER — Inpatient Hospital Stay (HOSPITAL_COMMUNITY)
Admission: EM | Admit: 2016-06-09 | Discharge: 2016-06-12 | DRG: 384 | Disposition: A | Discharge status: Home / Self Care | Payer: Medicare HMO | Source: Other Acute Inpatient Hospital | Attending: Internal Medicine | Admitting: Internal Medicine

## 2016-06-09 DIAGNOSIS — K649 Unspecified hemorrhoids: Secondary | ICD-10-CM | POA: Diagnosis present

## 2016-06-09 DIAGNOSIS — M549 Dorsalgia, unspecified: Secondary | ICD-10-CM | POA: Diagnosis present

## 2016-06-09 DIAGNOSIS — K219 Gastro-esophageal reflux disease without esophagitis: Secondary | ICD-10-CM | POA: Diagnosis not present

## 2016-06-09 DIAGNOSIS — K920 Hematemesis: Secondary | ICD-10-CM | POA: Diagnosis not present

## 2016-06-09 DIAGNOSIS — J410 Simple chronic bronchitis: Secondary | ICD-10-CM

## 2016-06-09 DIAGNOSIS — K922 Gastrointestinal hemorrhage, unspecified: Secondary | ICD-10-CM | POA: Diagnosis present

## 2016-06-09 DIAGNOSIS — D62 Acute posthemorrhagic anemia: Secondary | ICD-10-CM | POA: Diagnosis present

## 2016-06-09 DIAGNOSIS — J9811 Atelectasis: Secondary | ICD-10-CM | POA: Diagnosis not present

## 2016-06-09 DIAGNOSIS — R739 Hyperglycemia, unspecified: Secondary | ICD-10-CM | POA: Diagnosis not present

## 2016-06-09 DIAGNOSIS — I9589 Other hypotension: Secondary | ICD-10-CM | POA: Diagnosis not present

## 2016-06-09 DIAGNOSIS — I959 Hypotension, unspecified: Secondary | ICD-10-CM | POA: Diagnosis present

## 2016-06-09 DIAGNOSIS — R9431 Abnormal electrocardiogram [ECG] [EKG]: Secondary | ICD-10-CM | POA: Diagnosis not present

## 2016-06-09 DIAGNOSIS — K210 Gastro-esophageal reflux disease with esophagitis: Secondary | ICD-10-CM | POA: Diagnosis present

## 2016-06-09 DIAGNOSIS — K259 Gastric ulcer, unspecified as acute or chronic, without hemorrhage or perforation: Principal | ICD-10-CM | POA: Diagnosis present

## 2016-06-09 DIAGNOSIS — K921 Melena: Secondary | ICD-10-CM | POA: Diagnosis not present

## 2016-06-09 DIAGNOSIS — Z85048 Personal history of other malignant neoplasm of rectum, rectosigmoid junction, and anus: Secondary | ICD-10-CM | POA: Diagnosis not present

## 2016-06-09 DIAGNOSIS — J449 Chronic obstructive pulmonary disease, unspecified: Secondary | ICD-10-CM | POA: Diagnosis present

## 2016-06-09 DIAGNOSIS — G8929 Other chronic pain: Secondary | ICD-10-CM | POA: Diagnosis present

## 2016-06-09 DIAGNOSIS — K254 Chronic or unspecified gastric ulcer with hemorrhage: Secondary | ICD-10-CM | POA: Diagnosis present

## 2016-06-09 HISTORY — DX: Gastro-esophageal reflux disease without esophagitis: K21.9

## 2016-06-09 HISTORY — DX: Unspecified hemorrhoids: K64.9

## 2016-06-09 HISTORY — PX: ESOPHAGOGASTRODUODENOSCOPY: SHX5428

## 2016-06-09 HISTORY — DX: Melena: K92.1

## 2016-06-09 HISTORY — DX: Hypotension, unspecified: I95.9

## 2016-06-09 HISTORY — DX: Chronic obstructive pulmonary disease, unspecified: J44.9

## 2016-06-09 HISTORY — DX: Dorsalgia, unspecified: M54.9

## 2016-06-09 HISTORY — DX: Hematemesis: K92.0

## 2016-06-09 HISTORY — DX: Other chronic pain: G89.29

## 2016-06-09 LAB — GLUCOSE, CAPILLARY
GLUCOSE-CAPILLARY: 87 mg/dL (ref 65–99)
GLUCOSE-CAPILLARY: 88 mg/dL (ref 65–99)
Glucose-Capillary: 101 mg/dL — ABNORMAL HIGH (ref 65–99)
Glucose-Capillary: 91 mg/dL (ref 65–99)

## 2016-06-09 LAB — CBC
HEMATOCRIT: 29.1 % — AB (ref 39.0–52.0)
HEMATOCRIT: 29.4 % — AB (ref 39.0–52.0)
Hemoglobin: 9.6 g/dL — ABNORMAL LOW (ref 13.0–17.0)
Hemoglobin: 9.6 g/dL — ABNORMAL LOW (ref 13.0–17.0)
MCH: 30.5 pg (ref 26.0–34.0)
MCH: 30 pg (ref 26.0–34.0)
MCHC: 33 g/dL (ref 30.0–36.0)
MCHC: 32.7 g/dL (ref 30.0–36.0)
MCV: 92.4 fL (ref 78.0–100.0)
MCV: 91.9 fL (ref 78.0–100.0)
PLATELETS: 199 10*3/uL (ref 150–400)
PLATELETS: 196 10*3/uL (ref 150–400)
RBC: 3.15 MIL/uL — ABNORMAL LOW (ref 4.22–5.81)
RBC: 3.2 MIL/uL — ABNORMAL LOW (ref 4.22–5.81)
RDW: 12.8 % (ref 11.5–15.5)
RDW: 12.8 % (ref 11.5–15.5)
WBC: 7.8 10*3/uL (ref 4.0–10.5)
WBC: 6.9 10*3/uL (ref 4.0–10.5)

## 2016-06-09 LAB — COMPREHENSIVE METABOLIC PANEL
ALK PHOS: 46 U/L (ref 38–126)
ALT: 17 U/L (ref 17–63)
AST: 16 U/L (ref 15–41)
Albumin: 3 g/dL — ABNORMAL LOW (ref 3.5–5.0)
Anion gap: 3 — ABNORMAL LOW (ref 5–15)
BUN: 59 mg/dL — AB (ref 6–20)
CALCIUM: 8.2 mg/dL — AB (ref 8.9–10.3)
CO2: 24 mmol/L (ref 22–32)
CREATININE: 0.84 mg/dL (ref 0.61–1.24)
Chloride: 112 mmol/L — ABNORMAL HIGH (ref 101–111)
GFR calc Af Amer: >60 mL/min (ref >60)
GFR calc non Af Amer: >60 mL/min (ref >60)
Glucose, Bld: 113 mg/dL — ABNORMAL HIGH (ref 65–99)
POTASSIUM: 4.4 mmol/L (ref 3.5–5.1)
Sodium: 139 mmol/L (ref 135–145)
TOTAL PROTEIN: 5.4 g/dL — AB (ref 6.5–8.1)
Total Bilirubin: 0.9 mg/dL (ref 0.3–1.2)

## 2016-06-09 LAB — TYPE AND SCREEN
ABO/RH(D): O NEG
Antibody Screen: NEGATIVE

## 2016-06-09 LAB — PROTIME-INR
INR: 1.12
PROTHROMBIN TIME: 14.4 s (ref 11.4–15.2)

## 2016-06-09 LAB — MRSA PCR SCREENING: MRSA by PCR: NEGATIVE

## 2016-06-09 SURGERY — EGD (ESOPHAGOGASTRODUODENOSCOPY)
Anesthesia: Monitor Anesthesia Care

## 2016-06-09 MED ORDER — FENTANYL CITRATE (PF) 100 MCG/2ML IJ SOLN
INTRAMUSCULAR | Status: AC
  Filled 2016-06-09: qty 2

## 2016-06-09 MED ORDER — INSULIN ASPART 100 UNIT/ML ~~LOC~~ SOLN
0.0000 [IU] | SUBCUTANEOUS | Status: DC
  Administered 2016-06-11: 1 [IU] via SUBCUTANEOUS

## 2016-06-09 MED ORDER — SODIUM CHLORIDE 0.9 % IV SOLN
80.0000 mg | Freq: Once | INTRAVENOUS | Status: AC
  Administered 2016-06-09: 15:00:00 80 mg via INTRAVENOUS
  Filled 2016-06-09: qty 80

## 2016-06-09 MED ORDER — SODIUM CHLORIDE 0.9 % IV SOLN: Freq: Once | INTRAVENOUS | Status: DC

## 2016-06-09 MED ORDER — PROPOFOL 500 MG/50ML IV EMUL
INTRAVENOUS | Status: DC | PRN
  Administered 2016-06-09: 100 ug/kg/min via INTRAVENOUS

## 2016-06-09 MED ORDER — SODIUM CHLORIDE 0.9% FLUSH
3.0000 mL | Freq: Two times a day (BID) | INTRAVENOUS | Status: DC
  Administered 2016-06-11 – 2016-06-12 (×2): 3 mL via INTRAVENOUS

## 2016-06-09 MED ORDER — ACETAMINOPHEN 650 MG RE SUPP: 650.0000 mg | Freq: Four times a day (QID) | RECTAL | Status: DC | PRN

## 2016-06-09 MED ORDER — EPINEPHRINE PF 1 MG/10ML IJ SOSY
PREFILLED_SYRINGE | INTRAMUSCULAR | Status: AC
  Filled 2016-06-09: qty 10

## 2016-06-09 MED ORDER — ONDANSETRON HCL 4 MG/2ML IJ SOLN
4.0000 mg | INTRAMUSCULAR | Status: AC
  Administered 2016-06-09 (×2): 4 mg via INTRAVENOUS
  Filled 2016-06-09 (×2): qty 2

## 2016-06-09 MED ORDER — SODIUM CHLORIDE 0.9 % IV SOLN
8.0000 mg/h | INTRAVENOUS | Status: DC
  Administered 2016-06-09 – 2016-06-11 (×4): 8 mg/h via INTRAVENOUS
  Filled 2016-06-09 (×10): qty 80

## 2016-06-09 MED ORDER — LACTATED RINGERS IV SOLN
INTRAVENOUS | Status: DC | PRN
  Administered 2016-06-09: 08:00:00 via INTRAVENOUS

## 2016-06-09 MED ORDER — ACETAMINOPHEN 325 MG PO TABS: 650.0000 mg | ORAL_TABLET | Freq: Four times a day (QID) | ORAL | Status: DC | PRN

## 2016-06-09 MED ORDER — SODIUM CHLORIDE 0.9 % IJ SOLN
PREFILLED_SYRINGE | INTRAMUSCULAR | Status: DC | PRN
  Administered 2016-06-09: 6 mL

## 2016-06-09 MED ORDER — FENTANYL CITRATE (PF) 100 MCG/2ML IJ SOLN
25.0000 ug | Freq: Once | INTRAMUSCULAR | Status: AC
  Administered 2016-06-09: 25 ug via INTRAVENOUS

## 2016-06-09 MED ORDER — PANTOPRAZOLE SODIUM 40 MG IV SOLR: 40.0000 mg | Freq: Two times a day (BID) | INTRAVENOUS | Status: DC

## 2016-06-09 MED ORDER — HYDRALAZINE HCL 20 MG/ML IJ SOLN: 5.0000 mg | INTRAMUSCULAR | Status: DC | PRN

## 2016-06-09 MED ORDER — PANTOPRAZOLE SODIUM 40 MG IV SOLR: 40.0000 mg | INTRAVENOUS | Status: DC

## 2016-06-09 MED ORDER — ONDANSETRON HCL 4 MG/2ML IJ SOLN
INTRAMUSCULAR | Status: DC | PRN
  Administered 2016-06-09: 4 mg via INTRAVENOUS

## 2016-06-09 MED ORDER — SODIUM CHLORIDE 0.9 % IV SOLN
INTRAVENOUS | Status: AC
  Administered 2016-06-09: 12:00:00 via INTRAVENOUS

## 2016-06-09 NOTE — Progress Notes (Signed)
Dr. Michail Sermon at bedside.   Pt c/o pain related to epinephrine injection intraop.  VO to give Fentanyl 25 mcq IV with anesthesia approval received.  Spoke with Dr. Ola Spurr, in agreement with Fentanyl 25 mcq IV.  Given.

## 2016-06-09 NOTE — Consult Note (Signed)
Referring Provider: Dr. Marily Memos Primary Care Physician:  Dettinger, [name], MD Primary Gastroenterologist:  UNASSIGNED  Reason for Consultation:  GI bleed  HPI: Todd Berg is a 66 y.o. male transferred from [facility] due to multiple episodes of coffee grounds emesis and melena. Reports coffee grounds emesis started at the same time as diffuse sharp severe abdominal pain and the vomiting occurred multiple times. Had multiple episodes of black stools as well. Denies red blood vomitus or hematochezia. Reports vomiting black fluid 4-5 times in the past but denies ever having an EGD done. Denies any known history of peptic ulcer disease. History of rectal cancer diagnosed in 2015 that he reports is in remission. Denies ever having surgery for it. Denies NSAIDs or alcohol use. Hgb 10.8 at outside hospital and 9.6 here.  Past Medical History:  Diagnosis Date  . Cancer ([place]) 2015   rectal  . Chronic back pain   . COPD (chronic obstructive pulmonary disease) ([place])   . GERD (gastroesophageal reflux disease)   . Hematemesis   . Hemorrhoids   . Hypotension   . Melena     Past Surgical History:  Procedure Laterality Date  . BACK SURGERY    . HEMORRHOID SURGERY    . HERNIA REPAIR    . RECTAL SURGERY      Prior to Admission medications   Medication Sig Start Date End Date Taking? Authorizing Provider  clotrimazole-betamethasone (LOTRISONE) cream Apply 1 application topically 2 (two) times daily. 02/11/16   Dettinger, [name], MD  meclizine (ANTIVERT) 25 MG tablet Take 1 tablet (25 mg total) by mouth 3 (three) times daily as needed for dizziness. 05/21/16   Chevis Pretty, FNP    Scheduled Meds: . [MAR Hold] insulin aspart  0-9 Units Subcutaneous Q4H  . [MAR Hold] ondansetron (ZOFRAN) IV  4 mg Intravenous Q4H  . [MAR Hold] pantoprazole (PROTONIX) IV  40 mg Intravenous Q24H  . [MAR Hold] sodium chloride flush  3 mL Intravenous Q12H   Continuous Infusions: . sodium chloride     . [MAR Hold] sodium chloride     PRN Meds:.[MAR Hold] acetaminophen **OR** [MAR Hold] acetaminophen  Allergies as of 06/09/2016  . (No Known Allergies)    Family History  Problem Relation Age of Onset  . COPD Mother   . Stroke Father     Social History   Social History  . Marital status: Married    Spouse name: N/A  . Number of children: N/A  . Years of education: N/A   Occupational History  . Not on file.   Social History Main Topics  . Smoking status: Former Smoker    Start date: 12/30/1964    Quit date: 06/19/2009  . Smokeless tobacco: Former Systems developer    Types: Snuff    Quit date: 09/26/2005  . Alcohol use No  . Drug use: No  . Sexual activity: Not on file   Other Topics Concern  . Not on file   Social History Narrative  . No narrative on file    Review of Systems: All negative except as stated above in HPI.  Physical Exam: Vital signs: Vitals:   06/09/16 0707 06/09/16 0855  BP: 117/65 134/72  Pulse: 66 67  Resp: 17 14  Temp: (!) 96.8 F (36 C)    Last BM Date: 06/09/16 General:   Alert,  Well-developed, well-nourished, pleasant and cooperative in NAD, elderly Head: atraumatic, normocephalic Eyes: anicteric sclera ENT: oropharynx clear, poor dentition Neck: supple, nontender Lungs:  Clear throughout to auscultation.   No wheezes, crackles, or rhonchi. No acute distress. Heart:  Regular rate and rhythm; no murmurs, clicks, rubs,  or gallops. Abdomen: soft, nontender, nondistended, +BS  Rectal:  Deferred Ext: no edema  GI:  Lab Results:  Recent Labs  06/09/16 0759  WBC 7.8  HGB 9.6*  HCT 29.1*  PLT 199   BMET  Recent Labs  06/09/16 0759  NA 139  K 4.4  CL 112*  CO2 24  GLUCOSE 113*  BUN 59*  CREATININE 0.84  CALCIUM 8.2*   LFT  Recent Labs  06/09/16 0759  PROT 5.4*  ALBUMIN 3.0*  AST 16  ALT 17  ALKPHOS 46  BILITOT 0.9   PT/INR  Recent Labs  06/09/16 0759  LABPROT 14.4  INR 1.12     Studies/Results: No  results found.  Impression/Plan: Upper GI bleed concerning for peptic ulcer bleed. EGD today. May need Protonix drip pending EGD findings. Supportive care.    LOS: 0 days   [place] C.  06/09/2016, 9:49 AM  Pager [phone]  AFTER 5 pm or on weekends please call [phone]

## 2016-06-09 NOTE — H&P (Signed)
History and Physical    VILAS EDGERLY BVQ:[id number] DOB: 15-Jun-1950 DOA: 06/09/2016  PCP: Dettinger, [name], MD Patient coming from: [facility]  Chief Complaint: hematemesis/melena  HPI: Todd Berg is a pleasant 67 y.o. male with medical history significant for  COPD, chronic back pain, GERD, rectal cancer status post surgery admitted to room 24 on 4 E. at [facility] from [facility] emergency Department chief complaint hematemesis, nausea and vomiting. [facility] for concern for GI bleed and presumption patient will need endoscopy and that service is not available at [facility].  Information is obtained from the chart and the patient. Patient reports a 2 day history of dark Fedrick Cefalu stool and yesterday evening developed hematemesis. Associated symptoms include nausea, anorexia, decreased appetite. Reports he had 3 episodes before going to the emergency department and one at the emergency department. He denies abdominal pain diarrhea constipation. He denies NSAID use tobacco use EtOH use. He reports he's had a colonoscopy in the past given his history of rectal cancer as never been positive. He denies ever having an endoscopy. He denies headache chest pain palpitation shortness of breath. Denies dysuria hematuria frequency or urgency. He denies fever chills lower 70 edema or orthopnea Chart review indicates 3 weeks ago he went to see his primary care provider with the chief complaint of dizziness and general fatigue.  In the emergency department he has a soft blood pressure otherwise hemodynamically stable and not hypoxic nontoxic appearing. Of note he also experienced a fall at [facility] emergency department when he attempted to get up from the wheelchair by himself.    ED Course: In the emergency department he is provided with IV fluids, Protonix IV and Zofran. Soft blood pressure no tachycardia no tachypnea is not hypoxic is nontoxic appearing  Review of Systems: As per  HPI otherwise all other systems reviewed and are negative.   Ambulatory Status: In place independently is independent with ADLs  Past Medical History:  Diagnosis Date  . Cancer ([place]) 2015   rectal  . Chronic back pain   . COPD (chronic obstructive pulmonary disease) ([place])   . GERD (gastroesophageal reflux disease)   . Hematemesis   . Hemorrhoids   . Hypotension   . Melena     Past Surgical History:  Procedure Laterality Date  . BACK SURGERY    . HEMORRHOID SURGERY    . HERNIA REPAIR    . RECTAL SURGERY      Social History   Social History  . Marital status: Married    Spouse name: N/A  . Number of children: N/A  . Years of education: N/A   Occupational History  . Not on file.   Social History Main Topics  . Smoking status: Former Smoker    Start date: 12/30/1964    Quit date: 06/19/2009  . Smokeless tobacco: Former Systems developer    Types: Snuff    Quit date: 09/26/2005  . Alcohol use No  . Drug use: No  . Sexual activity: Not on file   Other Topics Concern  . Not on file   Social History Narrative  . No narrative on file    No Known Allergies  Family History  Problem Relation Age of Onset  . COPD Mother   . Stroke Father     Prior to Admission medications   Medication Sig Start Date End Date Taking? Authorizing Provider  clotrimazole-betamethasone (LOTRISONE) cream Apply 1 application topically 2 (two) times daily. 02/11/16  Dettinger, [name], MD  meclizine (ANTIVERT) 25 MG tablet Take 1 tablet (25 mg total) by mouth 3 (three) times daily as needed for dizziness. 05/21/16   Chevis Pretty, FNP    Physical Exam: Vitals:   06/09/16 9509 06/09/16 0707  BP: 129/71 117/65  Pulse: 66 66  Resp: 18 17  Temp: 97.7 F (36.5 C) (!) 96.8 F (36 C)  TempSrc:  Axillary  SpO2: 100% 100%  Weight: 92 kg (202 lb 13.2 oz)   Height: 6' (1.829 m)      General:  Appears calm Cooperative no acute distress, giggles  Eyes:  PERRL, EOMI, normal lids, iris ENT:   grossly normal hearing, lips & tongue, mucous membranes of his mouth are slightly dry somewhat pale, very poor dentition Neck:  no LAD, masses or thyromegaly Cardiovascular:  RRR, no m/r/g. No LE edema. Pedal pulses present and palpable Respiratory:  CTA bilaterally, no w/r/r. Normal respiratory effort. Abdomen:  soft, ntnd, sluggish bowel sounds no guarding or rebounding Skin:  no rash or induration seen on limited exam Musculoskeletal:  grossly normal tone BUE/BLE, good ROM, no bony abnormality Psychiatric:  grossly normal mood and affect, speech fluent and appropriate, AOx3 Neurologic:  CN 2-12 grossly intact, moves all extremities in coordinated fashion, sensation intact. Alert oriented able to answer simple questions. Unaware of any details of his history  Labs on Admission: I have personally reviewed following labs and imaging studies  CBC: No results for input(s): WBC, NEUTROABS, HGB, HCT, MCV, PLT in the last 168 hours. Basic Metabolic Panel: No results for input(s): NA, K, CL, CO2, GLUCOSE, BUN, CREATININE, CALCIUM, MG, PHOS in the last 168 hours. GFR: CrCl cannot be calculated (Patient's most recent lab result is older than the maximum 21 days allowed.). Liver Function Tests: No results for input(s): AST, ALT, ALKPHOS, BILITOT, PROT, ALBUMIN in the last 168 hours. No results for input(s): LIPASE, AMYLASE in the last 168 hours. No results for input(s): AMMONIA in the last 168 hours. Coagulation Profile: No results for input(s): INR, PROTIME in the last 168 hours. Cardiac Enzymes: No results for input(s): CKTOTAL, CKMB, CKMBINDEX, TROPONINI in the last 168 hours. BNP (last 3 results) No results for input(s): PROBNP in the last 8760 hours. HbA1C: No results for input(s): HGBA1C in the last 72 hours. CBG: No results for input(s): GLUCAP in the last 168 hours. Lipid Profile: No results for input(s): CHOL, HDL, LDLCALC, TRIG, CHOLHDL, LDLDIRECT in the last 72 hours. Thyroid  Function Tests: No results for input(s): TSH, T4TOTAL, FREET4, T3FREE, THYROIDAB in the last 72 hours. Anemia Panel: No results for input(s): VITAMINB12, FOLATE, FERRITIN, TIBC, IRON, RETICCTPCT in the last 72 hours. Urine analysis:    Component Value Date/Time   COLORURINE YELLOW 02/26/2007 1901   APPEARANCEUR CLEAR 02/26/2007 1901   LABSPEC 1.003 (L) 02/26/2007 1901   PHURINE 6.5 02/26/2007 1901   GLUCOSEU NEGATIVE 02/26/2007 1901   HGBUR NEGATIVE 02/26/2007 1901   BILIRUBINUR NEGATIVE 02/26/2007 1901   KETONESUR NEGATIVE 02/26/2007 1901   PROTEINUR NEGATIVE 02/26/2007 1901   UROBILINOGEN 0.2 02/26/2007 1901   NITRITE NEGATIVE 02/26/2007 1901   LEUKOCYTESUR  02/26/2007 1901    NEGATIVE MICROSCOPIC NOT DONE ON URINES WITH NEGATIVE PROTEIN, BLOOD, LEUKOCYTES, NITRITE, OR GLUCOSE <1000 mg/dL.    Creatinine Clearance: CrCl cannot be calculated (Patient's most recent lab result is older than the maximum 21 days allowed.).  Sepsis Labs: @LABRCNTIP (procalcitonin:4,lacticidven:4) )No results found for this or any previous visit (from the past 240 hour(s)).  Radiological Exams on Admission: No results found.  EKG: EKG at [facility] reveals SR  Assessment/Plan Principal Problem:   Hematemesis Active Problems:   Chronic back pain   COPD (chronic obstructive pulmonary disease) (HCC)   GERD (gastroesophageal reflux disease)   Hemorrhoids   Melena   Hypotension   GI bleed   Hyperglycemia   Acute blood loss anemia   1. Hematemesis/gi bleed. She denies EtOH NSAID use. Chart review indicates he's been prescribed Mobic in past but he denies use. CT at [facility] reveals upper GI thickening but no note of ulcer. BUN 67. Creatinine 0.90.  No further episode since transferring. Denies nausea on admission. -Admit to step down -Serial CBCs -will obtain updated cmet and INR -type and screen -scheduled zofran -Gentle IV fluids -protonix -npo -GI consult  #2. Acute blood loss anemia.  Patient's hemoglobin was 10.8 upon presentation to the emergency department. No tachycardia -Serial CBCs -Gentle IV fluids -FOBT -Type and screen -Consider transfusion if hemoglobin trends downward and hematemesis continues  #3. Hypotension. Likely related to above. He is provided with IV fluids in the emergency department. On admission systolic blood pressure 315 -Admitting to step down -Vital signs every 4 hours -Continue IV fluids -See above  #4. Hyperglycemia. Serum glucose 168 in emergency department. No history of diabetes -We'll obtain a hemoglobin A1c -Check CBGs every 4 hours -Sliding scale for optimal control -May need outpatient follow-up  #5. COPD. Appears stable at baseline. Chest x-ray reveals basilar atelectasis/scarring -Monitor  #6. GERD. -See above    DVT prophylaxis: scd Code Status: full  Family Communication: none present  Disposition Plan: home  Consults called: schooler  Admission status: inpatient    Radene Gunning MD [facility]  If 7PM-7AM, please contact night-coverage [email or web address] Password [email or web address]  06/09/2016, 8:09 AM

## 2016-06-09 NOTE — Brief Op Note (Signed)
Antral ulcers with one having a flat red area without active bleeding. Large amount of old blood in the dependent portions of the stomach. See endopro note for details. Changed to Protonix drip. NPO except ice chips and sips with meds. Check H. Pylori serology and treat if positive. Repeat EGD in 3 months to confirm healing of ulcers. Will follow.

## 2016-06-09 NOTE — Anesthesia Postprocedure Evaluation (Signed)
Anesthesia Post Note  Patient: Todd Berg  Procedure(s) Performed: Procedure(s) (LRB): ESOPHAGOGASTRODUODENOSCOPY (EGD) (N/A)  Patient location during evaluation: PACU Anesthesia Type: MAC Level of consciousness: awake and alert Pain management: pain level controlled Vital Signs Assessment: post-procedure vital signs reviewed and stable Respiratory status: spontaneous breathing, nonlabored ventilation, respiratory function stable and patient connected to nasal cannula oxygen Cardiovascular status: stable and blood pressure returned to baseline Anesthetic complications: no       Last Vitals:  Vitals:   06/09/16 1115 06/09/16 1125  BP: (!) 190/90 (!) 196/71  Pulse: (!) 58 63  Resp: 17 19  Temp:      Last Pain:  Vitals:   06/09/16 1125  TempSrc:   PainSc: 2                  Tiajuana Amass

## 2016-06-09 NOTE — Op Note (Signed)
[facility] Patient Name: Todd Berg Procedure Date : 06/09/2016 MRN: [id number] Attending MD: Lear Ng , MD Date of Birth: 10/01/1950 CSN: [id number] Age: 66 Admit Type: Inpatient Procedure:                Upper GI endoscopy Indications:              Active gastrointestinal bleeding, Coffee-ground                            emesis, Melena Providers:                Lear Ng, MD, Burtis Junes, RN, Tinnie Gens, Technician Referring MD:              Medicines:                Propofol per Anesthesia, Monitored Anesthesia Care Complications:            No immediate complications. Estimated Blood Loss:     Estimated blood loss was minimal. Procedure:                Pre-Anesthesia Assessment:                           - Prior to the procedure, a History and Physical                            was performed, and patient medications and                            allergies were reviewed. The patient's tolerance of                            previous anesthesia was also reviewed. The risks                            and benefits of the procedure and the sedation                            options and risks were discussed with the patient.                            All questions were answered, and informed consent                            was obtained. Prior Anticoagulants: The patient has                            taken no previous anticoagulant or antiplatelet                            agents. ASA Grade Assessment: III - A patient with  severe systemic disease. After reviewing the risks                            and benefits, the patient was deemed in                            satisfactory condition to undergo the procedure.                           After obtaining informed consent, the endoscope was                            passed under direct vision. Throughout the   procedure, the patient's blood pressure, pulse, and                            oxygen saturations were monitored continuously. The                            EG-[id number] ([id number]) scope was introduced through the                            mouth, and advanced to the second part of duodenum.                            The upper GI endoscopy was accomplished without                            difficulty. The patient tolerated the procedure                            well. Scope In: Scope Out: Findings:      LA Grade A (one or more mucosal breaks less than 5 mm, not extending       between tops of 2 mucosal folds) esophagitis with bleeding was found at       the gastroesophageal junction.      The exam of the esophagus was otherwise normal.      The Z-line was found 40 cm from the incisors.      Few non-bleeding cratered gastric ulcers with pigmented material were       found in the gastric antrum and in the prepyloric region of the stomach.       The largest lesion was 6 mm in largest dimension. Area was successfully       injected with 6 mL of a 1:10,000 solution of epinephrine for hemostasis.       Estimated blood loss was minimal.      Hematin (altered blood/coffee-ground-like material) was found in the       gastric body.      The cardia and gastric fundus were normal on retroflexion.      The examined duodenum was normal. Impression:               - LA Grade A reflux esophagitis.                           - Z-line, 40  cm from the incisors.                           - Non-bleeding gastric ulcers with pigmented                            material. Injected.                           - Hematin (altered blood/coffee-ground-like                            material) in the gastric body.                           - Normal examined duodenum.                           - No specimens collected. Moderate Sedation:      N/A - MAC procedure Recommendation:           - NPO.                            - Observe patient's clinical course.                           - Give Protonix (pantoprazole): initiate therapy                            with 80 mg IV bolus, then 8 mg/hr IV by continuous                            infusion.                           - Perform an H. pylori serology.                           - Post procedure medication orders were given. Procedure Code(s):        --- Professional ---                           [phone], Esophagogastroduodenoscopy, flexible,                            transoral; with control of bleeding, any method Diagnosis Code(s):        --- Professional ---                           K92.0, Hematemesis                           K92.2, Gastrointestinal hemorrhage, unspecified                           K25.9, Gastric ulcer, unspecified as acute or  chronic, without hemorrhage or perforation                           K21.0, Gastro-esophageal reflux disease with                            esophagitis                           K92.1, Melena (includes Hematochezia) CPT copyright 2016 American Medical Association. All rights reserved. The codes documented in this report are preliminary and upon coder review may  be revised to meet current compliance requirements. Lear Ng, MD 06/09/2016 10:38:26 AM This report has been signed electronically. Number of Addenda: 0

## 2016-06-09 NOTE — H&P (View-Only) (Signed)
Referring Provider: Dr. Marily Memos Primary Care Physician:  Dettinger, [name], MD Primary Gastroenterologist:  UNASSIGNED  Reason for Consultation:  GI bleed  HPI: Todd Berg is a 66 y.o. male transferred from [facility] due to multiple episodes of coffee grounds emesis and melena. Reports coffee grounds emesis started at the same time as diffuse sharp severe abdominal pain and the vomiting occurred multiple times. Had multiple episodes of black stools as well. Denies red blood vomitus or hematochezia. Reports vomiting black fluid 4-5 times in the past but denies ever having an EGD done. Denies any known history of peptic ulcer disease. History of rectal cancer diagnosed in 2015 that he reports is in remission. Denies ever having surgery for it. Denies NSAIDs or alcohol use. Hgb 10.8 at outside hospital and 9.6 here.  Past Medical History:  Diagnosis Date  . Cancer ([place]) 2015   rectal  . Chronic back pain   . COPD (chronic obstructive pulmonary disease) ([place])   . GERD (gastroesophageal reflux disease)   . Hematemesis   . Hemorrhoids   . Hypotension   . Melena     Past Surgical History:  Procedure Laterality Date  . BACK SURGERY    . HEMORRHOID SURGERY    . HERNIA REPAIR    . RECTAL SURGERY      Prior to Admission medications   Medication Sig Start Date End Date Taking? Authorizing Provider  clotrimazole-betamethasone (LOTRISONE) cream Apply 1 application topically 2 (two) times daily. 02/11/16   Dettinger, [name], MD  meclizine (ANTIVERT) 25 MG tablet Take 1 tablet (25 mg total) by mouth 3 (three) times daily as needed for dizziness. 05/21/16   Chevis Pretty, FNP    Scheduled Meds: . [MAR Hold] insulin aspart  0-9 Units Subcutaneous Q4H  . [MAR Hold] ondansetron (ZOFRAN) IV  4 mg Intravenous Q4H  . [MAR Hold] pantoprazole (PROTONIX) IV  40 mg Intravenous Q24H  . [MAR Hold] sodium chloride flush  3 mL Intravenous Q12H   Continuous Infusions: . sodium chloride     . [MAR Hold] sodium chloride     PRN Meds:.[MAR Hold] acetaminophen **OR** [MAR Hold] acetaminophen  Allergies as of 06/09/2016  . (No Known Allergies)    Family History  Problem Relation Age of Onset  . COPD Mother   . Stroke Father     Social History   Social History  . Marital status: Married    Spouse name: N/A  . Number of children: N/A  . Years of education: N/A   Occupational History  . Not on file.   Social History Main Topics  . Smoking status: Former Smoker    Start date: 12/30/1964    Quit date: 06/19/2009  . Smokeless tobacco: Former Systems developer    Types: Snuff    Quit date: 09/26/2005  . Alcohol use No  . Drug use: No  . Sexual activity: Not on file   Other Topics Concern  . Not on file   Social History Narrative  . No narrative on file    Review of Systems: All negative except as stated above in HPI.  Physical Exam: Vital signs: Vitals:   06/09/16 0707 06/09/16 0855  BP: 117/65 134/72  Pulse: 66 67  Resp: 17 14  Temp: (!) 96.8 F (36 C)    Last BM Date: 06/09/16 General:   Alert,  Well-developed, well-nourished, pleasant and cooperative in NAD, elderly Head: atraumatic, normocephalic Eyes: anicteric sclera ENT: oropharynx clear, poor dentition Neck: supple, nontender Lungs:  Clear throughout to auscultation.   No wheezes, crackles, or rhonchi. No acute distress. Heart:  Regular rate and rhythm; no murmurs, clicks, rubs,  or gallops. Abdomen: soft, nontender, nondistended, +BS  Rectal:  Deferred Ext: no edema  GI:  Lab Results:  Recent Labs  06/09/16 0759  WBC 7.8  HGB 9.6*  HCT 29.1*  PLT 199   BMET  Recent Labs  06/09/16 0759  NA 139  K 4.4  CL 112*  CO2 24  GLUCOSE 113*  BUN 59*  CREATININE 0.84  CALCIUM 8.2*   LFT  Recent Labs  06/09/16 0759  PROT 5.4*  ALBUMIN 3.0*  AST 16  ALT 17  ALKPHOS 46  BILITOT 0.9   PT/INR  Recent Labs  06/09/16 0759  LABPROT 14.4  INR 1.12     Studies/Results: No  results found.  Impression/Plan: Upper GI bleed concerning for peptic ulcer bleed. EGD today. May need Protonix drip pending EGD findings. Supportive care.    LOS: 0 days   [place] C.  06/09/2016, 9:49 AM  Pager [phone]  AFTER 5 pm or on weekends please call [phone]

## 2016-06-09 NOTE — Transfer of Care (Signed)
Immediate Anesthesia Transfer of Care Note  Patient: Todd Berg  Procedure(s) Performed: Procedure(s): ESOPHAGOGASTRODUODENOSCOPY (EGD) (N/A)  Patient Location: PACU  Anesthesia Type:MAC  Level of Consciousness: awake, alert  and oriented  Airway & Oxygen Therapy: Patient Spontanous Breathing and Patient connected to nasal cannula oxygen  Post-op Assessment: Report given to RN, Post -op Vital signs reviewed and stable and Patient moving all extremities  Post vital signs: Reviewed and stable  Last Vitals:  Vitals:   06/09/16 0855 06/09/16 1034  BP: 134/72 (!) 164/63  Pulse: 67 60  Resp: 14 (!) 26  Temp:      Last Pain:  Vitals:   06/09/16 1034  TempSrc: Oral         Complications: No apparent anesthesia complications

## 2016-06-09 NOTE — Anesthesia Procedure Notes (Signed)
Procedure Name: MAC Date/Time: 06/09/2016 9:53 AM Performed by: Kyung Rudd Pre-anesthesia Checklist: Patient identified, Emergency Drugs available, Suction available and Patient being monitored Patient Re-evaluated:Patient Re-evaluated prior to inductionOxygen Delivery Method: Nasal cannula Intubation Type: IV induction Placement Confirmation: positive ETCO2

## 2016-06-09 NOTE — Progress Notes (Signed)
Received patient post endoscopy drowsy , a;ert, oriented x4.  Patient states abdomen is a little tender not like before in procedure.  Instructed patient to please let RN know when he needs to go the bathroom.  Family members at the bedside,  Encouraged them to get lunch and come back in an hour to check on patient while he is sleeping,

## 2016-06-09 NOTE — Anesthesia Preprocedure Evaluation (Addendum)
Anesthesia Evaluation  Patient identified by MRN, date of birth, ID band Patient awake    Reviewed: Allergy & Precautions, NPO status , Patient's Chart, lab work & pertinent test results  Airway Mallampati: II  TM Distance: >3 FB Neck ROM: Full    Dental  (+) Poor Dentition   Pulmonary COPD, former smoker,    breath sounds clear to auscultation       Cardiovascular negative cardio ROS   Rhythm:Regular Rate:Normal     Neuro/Psych negative neurological ROS     GI/Hepatic Neg liver ROS, GERD  ,GI bleed   Endo/Other  negative endocrine ROS  Renal/GU negative Renal ROS     Musculoskeletal   Abdominal   Peds  Hematology  (+) anemia ,   Anesthesia Other Findings   Reproductive/Obstetrics                            Lab Results  Component Value Date   WBC 7.8 06/09/2016   HGB 9.6 (L) 06/09/2016   HCT 29.1 (L) 06/09/2016   MCV 92.4 06/09/2016   PLT 199 06/09/2016   Lab Results  Component Value Date   CREATININE 0.88 02/13/2015   BUN 19 02/13/2015   NA 139 02/13/2015   K 4.4 02/13/2015   CL 101 02/13/2015   CO2 23 02/13/2015    Anesthesia Physical Anesthesia Plan  ASA: III  Anesthesia Plan: MAC   Post-op Pain Management:    Induction: Intravenous  Airway Management Planned: Natural Airway and Nasal Cannula  Additional Equipment:   Intra-op Plan:   Post-operative Plan:   Informed Consent: I have reviewed the patients History and Physical, chart, labs and discussed the procedure including the risks, benefits and alternatives for the proposed anesthesia with the patient or authorized representative who has indicated his/her understanding and acceptance.     Plan Discussed with: CRNA  Anesthesia Plan Comments:         Anesthesia Quick Evaluation

## 2016-06-09 NOTE — Interval H&P Note (Signed)
History and Physical Interval Note:  06/09/2016 9:59 AM  Todd Berg  has presented today for surgery, with the diagnosis of GI bleed  The various methods of treatment have been discussed with the patient and family. After consideration of risks, benefits and other options for treatment, the patient has consented to  Procedure(s): ESOPHAGOGASTRODUODENOSCOPY (EGD) (N/A) as a surgical intervention .  The patient's history has been reviewed, patient examined, no change in status, stable for surgery.  I have reviewed the patient's chart and labs.  Questions were answered to the patient's satisfaction.     [place] C.

## 2016-06-10 DIAGNOSIS — R739 Hyperglycemia, unspecified: Secondary | ICD-10-CM

## 2016-06-10 DIAGNOSIS — K920 Hematemesis: Secondary | ICD-10-CM

## 2016-06-10 DIAGNOSIS — D62 Acute posthemorrhagic anemia: Secondary | ICD-10-CM

## 2016-06-10 LAB — CBC
HEMATOCRIT: 26 % — AB (ref 39.0–52.0)
HEMATOCRIT: 25.1 % — AB (ref 39.0–52.0)
Hemoglobin: 8.5 g/dL — ABNORMAL LOW (ref 13.0–17.0)
Hemoglobin: 8.3 g/dL — ABNORMAL LOW (ref 13.0–17.0)
MCH: 29.8 pg (ref 26.0–34.0)
MCH: 30.2 pg (ref 26.0–34.0)
MCHC: 32.7 g/dL (ref 30.0–36.0)
MCHC: 33.1 g/dL (ref 30.0–36.0)
MCV: 91.2 fL (ref 78.0–100.0)
MCV: 91.3 fL (ref 78.0–100.0)
PLATELETS: 183 10*3/uL (ref 150–400)
Platelets: 173 10*3/uL (ref 150–400)
RBC: 2.85 MIL/uL — AB (ref 4.22–5.81)
RBC: 2.75 MIL/uL — ABNORMAL LOW (ref 4.22–5.81)
RDW: 12.7 % (ref 11.5–15.5)
RDW: 12.8 % (ref 11.5–15.5)
WBC: 5.3 10*3/uL (ref 4.0–10.5)
WBC: 4.9 10*3/uL (ref 4.0–10.5)

## 2016-06-10 LAB — GLUCOSE, CAPILLARY
GLUCOSE-CAPILLARY: 83 mg/dL (ref 65–99)
GLUCOSE-CAPILLARY: 101 mg/dL — AB (ref 65–99)
Glucose-Capillary: 112 mg/dL — ABNORMAL HIGH (ref 65–99)
Glucose-Capillary: 118 mg/dL — ABNORMAL HIGH (ref 65–99)
Glucose-Capillary: 155 mg/dL — ABNORMAL HIGH (ref 65–99)
Glucose-Capillary: 82 mg/dL (ref 65–99)
Glucose-Capillary: 91 mg/dL (ref 65–99)

## 2016-06-10 LAB — BASIC METABOLIC PANEL
Anion gap: 4 — ABNORMAL LOW (ref 5–15)
BUN: 30 mg/dL — ABNORMAL HIGH (ref 6–20)
CO2: 23 mmol/L (ref 22–32)
CREATININE: 1.04 mg/dL (ref 0.61–1.24)
Calcium: 8.1 mg/dL — ABNORMAL LOW (ref 8.9–10.3)
Chloride: 114 mmol/L — ABNORMAL HIGH (ref 101–111)
Glucose, Bld: 96 mg/dL (ref 65–99)
POTASSIUM: 3.6 mmol/L (ref 3.5–5.1)
SODIUM: 141 mmol/L (ref 135–145)

## 2016-06-10 LAB — H PYLORI, IGM, IGG, IGA AB
H. Pylogi, Iga Abs: <9 units (ref 0.0–8.9)
H. Pylogi, Igm Abs: <9 units (ref 0.0–8.9)

## 2016-06-10 NOTE — Progress Notes (Signed)
Transferred patient to med/surg teley, [phone]

## 2016-06-10 NOTE — Progress Notes (Signed)
[facility] Gastroenterology Progress Note  Todd Berg 66 y.o. 12-18-1950   Subjective: Feels good. Hungry. Denies abdominal pain. Denies rectal bleeding.  Objective: Vital signs in last 24 hours: Vitals:   06/10/16 0833 06/10/16 1128  BP: (!) 137/115 (!) 144/75  Pulse:    Resp:    Temp: (!) 96.9 F (36.1 C) 97.5 F (36.4 C)  P 77, R 16  Physical Exam: Gen: alert, no acute distress, elderly HEENT: poor dentition CV: RRR Chest: CTA B Abd: soft, nontender, nondistended, +BS  Lab Results:  Recent Labs  06/09/16 0759 06/10/16 0500  NA 139 141  K 4.4 3.6  CL 112* 114*  CO2 24 23  GLUCOSE 113* 96  BUN 59* 30*  CREATININE 0.84 1.04  CALCIUM 8.2* 8.1*    Recent Labs  06/09/16 0759  AST 16  ALT 17  ALKPHOS 46  BILITOT 0.9  PROT 5.4*  ALBUMIN 3.0*    Recent Labs  06/10/16 0500 06/10/16 1121  WBC 5.3 4.9  HGB 8.5* 8.3*  HCT 26.0* 25.1*  MCV 91.2 91.3  PLT 183 173    Recent Labs  06/09/16 0759  LABPROT 14.4  INR 1.12      Assessment/Plan: S/P Gastric ulcer bleed without any further visible bleeding. Hgb 8.3 (9.6). Start full liquid diet and advance as tolerated. Continue Protonix drip today and change to IV PPI Q 12 hours tomorrow. H. Pylori pending (treat as outpt if positive). If Hgb stable and no further bleeding, then likely will be ok to d/c home on 06/12/16. Continue PPI PO BID for 3 months as outpt and then QD. Will need f/u EGD in 3 months to verify healing of gastric ulcers. Will sign off. Call if questions.   [place] C. 06/10/2016, 12:39 PM   Pager [phone]  AFTER 5 pm or on weekends call [phone] ID: Todd Berg, male   DOB: 04/09/1950, 66 y.o.   MRN: [id number]

## 2016-06-10 NOTE — Progress Notes (Signed)
[facility] PROGRESS NOTE  Todd Berg YTK:[id number] DOB: 01/23/1950 DOA: 06/09/2016  PCP: Dettinger, [name], MD  Brief History/Interval Summary: 66 year old Caucasian male with a past medical history of COPD, chronic back pain, GERD who has a history of rectal cancer status post surgery, presented with complaints of bloody emesis. He also gave a history of black colored stools. Patient was hospitalized for further management.  Reason for Visit: Upper GI bleed  Consultants: Gastroenterology  Procedures:  EGD Impression:                - LA Grade A reflux esophagitis. - Z-line, 40 cm from the incisors. - Non-bleeding gastric ulcers with pigmented material. Injected. - Hematin (altered blood/coffee-ground-like material) in the gastric body. - Normal examined duodenum. - No specimens collected.  Antibiotics: None  Subjective/Interval History: Patient feels well this morning. Hasn't had any further episodes of vomiting. Hasn't had any bowel movements yet. Denies any abdominal pain.  ROS: No chest pain or shortness of breath  Objective:  Vital Signs  Vitals:   06/10/16 0300 06/10/16 0354 06/10/16 0355 06/10/16 0833  BP: 119/68 132/81 132/81 (!) 137/115  Pulse: (!) 57 84 77   Resp: 15 15 16    Temp:  97.4 F (36.3 C)  (!) 96.9 F (36.1 C)  TempSrc:  Oral  Axillary  SpO2: 96% 98% 100%   Weight:      Height:        Intake/Output Summary (Last 24 hours) at 06/10/16 1121 Last data filed at 06/10/16 5573  Gross per 24 hour  Intake           812.08 ml  Output             1750 ml  Net          -937.92 ml   Filed Weights   06/09/16 2202  Weight: 92 kg (202 lb 13.2 oz)    General appearance: alert, cooperative, appears stated age and no distress Resp: clear to auscultation bilaterally Cardio: regular rate and rhythm, S1, S2 normal, no murmur, click, rub or gallop GI: soft, non-tender; bowel sounds normal; no masses,  no organomegaly Extremities:  extremities normal, atraumatic, no cyanosis or edema Neurologic: No focal deficits  Lab Results:  Data Reviewed: I have personally reviewed following labs and imaging studies  CBC:  Recent Labs Lab 06/09/16 0759 06/09/16 1241 06/10/16 0500  WBC 7.8 6.9 5.3  HGB 9.6* 9.6* 8.5*  HCT 29.1* 29.4* 26.0*  MCV 92.4 91.9 91.2  PLT 199 196 542    Basic Metabolic Panel:  Recent Labs Lab 06/09/16 0759 06/10/16 0500  NA 139 141  K 4.4 3.6  CL 112* 114*  CO2 24 23  GLUCOSE 113* 96  BUN 59* 30*  CREATININE 0.84 1.04  CALCIUM 8.2* 8.1*    GFR: Estimated Creatinine Clearance: 75.7 mL/min (by C-G formula based on SCr of 1.04 mg/dL).  Liver Function Tests:  Recent Labs Lab 06/09/16 0759  AST 16  ALT 17  ALKPHOS 46  BILITOT 0.9  PROT 5.4*  ALBUMIN 3.0*     Coagulation Profile:  Recent Labs Lab 06/09/16 0759  INR 1.12    CBG:  Recent Labs Lab 06/09/16 1700 06/09/16 1719 06/09/16 2018 06/09/16 2337 06/10/16 0353  GLUCAP 91 87 88 91 83     Recent Results (from the past 240 hour(s))  MRSA PCR Screening     Status: None   Collection Time: 06/09/16  6:36 AM  Result  Value Ref Range Status   MRSA by PCR NEGATIVE NEGATIVE Final    Comment:        The GeneXpert MRSA Assay (FDA approved for NASAL specimens only), is one component of a comprehensive MRSA colonization surveillance program. It is not intended to diagnose MRSA infection nor to guide or monitor treatment for MRSA infections.       Radiology Studies: No results found.   Medications:  Scheduled: . insulin aspart  0-9 Units Subcutaneous Q4H  . [START ON 06/12/2016] pantoprazole  40 mg Intravenous Q12H  . sodium chloride flush  3 mL Intravenous Q12H   Continuous: . sodium chloride Stopped (06/09/16 0815)  . pantoprozole (PROTONIX) infusion 8 mg/hr (06/10/16 0300)   PFX:[id number] **OR** acetaminophen, hydrALAZINE  Assessment/Plan:  Principal Problem:   Hematemesis Active  Problems:   Chronic back pain   COPD (chronic obstructive pulmonary disease) (HCC)   GERD (gastroesophageal reflux disease)   Hemorrhoids   Melena   Hypotension   GI bleed   Hyperglycemia   Acute blood loss anemia    Hematemesis/upper GI bleed Patient seen by gastroenterology. He underwent EGD, which showed evidence for ulcerations in the stomach and esophagitis. Patient on a PPI infusion. Appears to be hemodynamically stable. Remains nothing by mouth. Await further GI input.  Acute blood loss anemia. Hemoglobin has trended down. Continue to monitor for now. Transfuse if it continues to drop, but hold off for now. Repeat CBC later today.  Hypotension This was noted when he was hospitalized. He was given IV fluids with improvement in blood pressure. Now stable.  Hyperglycemia. High glucose level noted in the emergency department. Patient does not have any history of diabetes. Continue to monitor CBGs. CBGs have been normal. Elevated blood glucose level could have been due to acute stress.  History of COPD Stable.  DVT Prophylaxis: SCDs    Code Status: Full code  Family Communication: Discussed with the patient  Disposition Plan: Management as outlined above. Should be able to transfer him to floor later today.   LOS: 1 day   [place] Pager [phone] 06/10/2016, 11:21 AM  If 7PM-7AM, please contact night-coverage at [email or web address], password [facility]

## 2016-06-10 NOTE — Care Management Note (Signed)
Case Management Note  Patient Details  Name: AQEEL NORGAARD MRN: [id number] Date of Birth: 1950-11-19  Subjective/Objective:    Presents with hematemesis, copd, has hx of chronic back pain, hypotension, hyperglycemia, acute blood loss anemia. He has [employer].  PCP Vonna Kotyk Dettinger                 Action/Plan: NCM wil follow for dc needs.   Expected Discharge Date:                  Expected Discharge Plan:     In-House Referral:     Discharge planning Services  CM Consult  Post Acute Care Choice:    Choice offered to:     DME Arranged:    DME Agency:     HH Arranged:    HH Agency:     Status of Service:  In process, will continue to follow  If discussed at Long Length of Stay Meetings, dates discussed:    Additional Comments:  Zenon Mayo, RN 06/10/2016, 3:29 PM

## 2016-06-11 LAB — CBC
HEMATOCRIT: 25.4 % — AB (ref 39.0–52.0)
Hemoglobin: 8.5 g/dL — ABNORMAL LOW (ref 13.0–17.0)
MCH: 30.1 pg (ref 26.0–34.0)
MCHC: 33.5 g/dL (ref 30.0–36.0)
MCV: 90.1 fL (ref 78.0–100.0)
Platelets: 198 10*3/uL (ref 150–400)
RBC: 2.82 MIL/uL — ABNORMAL LOW (ref 4.22–5.81)
RDW: 12.8 % (ref 11.5–15.5)
WBC: 5.1 10*3/uL (ref 4.0–10.5)

## 2016-06-11 LAB — BASIC METABOLIC PANEL
ANION GAP: 5 (ref 5–15)
BUN: 16 mg/dL (ref 6–20)
CO2: 27 mmol/L (ref 22–32)
Calcium: 8.8 mg/dL — ABNORMAL LOW (ref 8.9–10.3)
Chloride: 108 mmol/L (ref 101–111)
Creatinine, Ser: 0.99 mg/dL (ref 0.61–1.24)
GFR calc Af Amer: >60 mL/min (ref >60)
GFR calc non Af Amer: >60 mL/min (ref >60)
GLUCOSE: 106 mg/dL — AB (ref 65–99)
POTASSIUM: 3.7 mmol/L (ref 3.5–5.1)
Sodium: 140 mmol/L (ref 135–145)

## 2016-06-11 LAB — GLUCOSE, CAPILLARY
GLUCOSE-CAPILLARY: 97 mg/dL (ref 65–99)
GLUCOSE-CAPILLARY: 124 mg/dL — AB (ref 65–99)
Glucose-Capillary: 102 mg/dL — ABNORMAL HIGH (ref 65–99)
Glucose-Capillary: 104 mg/dL — ABNORMAL HIGH (ref 65–99)
Glucose-Capillary: 106 mg/dL — ABNORMAL HIGH (ref 65–99)
Glucose-Capillary: 131 mg/dL — ABNORMAL HIGH (ref 65–99)

## 2016-06-11 MED ORDER — PANTOPRAZOLE SODIUM 40 MG IV SOLR
40.0000 mg | Freq: Two times a day (BID) | INTRAVENOUS | Status: DC
  Administered 2016-06-11 – 2016-06-12 (×3): 40 mg via INTRAVENOUS
  Filled 2016-06-11 (×3): qty 40

## 2016-06-11 NOTE — Consult Note (Signed)
           [facility] Primary Care Navigator  06/11/2016  Todd Berg February 23, 1950 [id number]     Patient was seen at the bedside to identify possible discharge needs. Patient reports having bloody emesis and blood in stools that led to this admission. Patient endorses Todd Berg with [place] the primary care provider.    Patient shared using [facility] in [place] to obtain medications without difficulty.   Patient states that hemanages his own medications at home straight out of the containers.  Patient states he was driving prior to admission. Wife(Todd Berg) can be able to drive to providetransportation to his doctors' appointments after discharge.  [employer] transportation benefit was discussed with patient.  Patient's wifewill be his primary caregiver at homeand grandson Todd Berg- who lives with them) can also provide assistance as stated.  Anticipated discharge plan is homeper patient.  Plan for discharge is pending physician's order and therapy recommendation.  Patient voiced understanding to call primary care provider's office when he returns home, for a post discharge follow-up appointment within a week or sooner if needed.Patient letter (with PCP's contact number) was provided as a reminder.  Explained to patient about [facility] CM services available for healthmanagementbut he denies any needs, issues or concerns at this time. [facility] care management information provided for future needs that may arise.  Patient expressed understanding to geta referral to [facility] care management from primary care provider if deemed necessary for servicesin the future.   For questions, please contact:  Dannielle Huh, BSN, RN- [facility] Primary Care Navigator  Telephone: [phone] [place]

## 2016-06-11 NOTE — Progress Notes (Signed)
[facility] PROGRESS NOTE  CHEO SELVEY WYO:[id number] DOB: 30-May-1950 DOA: 06/09/2016  PCP: Dettinger, [name], MD  Brief History/Interval Summary: 66 year old Caucasian male with a past medical history of COPD, chronic back pain, GERD who has a history of rectal cancer status post surgery, presented with complaints of bloody emesis. He also gave a history of black colored stools. Patient was hospitalized for further management.  Reason for Visit: Upper GI bleed  Consultants: Gastroenterology  Procedures:  EGD Impression:                - LA Grade A reflux esophagitis. - Z-line, 40 cm from the incisors. - Non-bleeding gastric ulcers with pigmented material. Injected. - Hematin (altered blood/coffee-ground-like material) in the gastric body. - Normal examined duodenum. - No specimens collected.  Antibiotics: None  Subjective/Interval History: Patient feels well. Did have an episode of black stool yesterday. No further episodes of nausea or vomiting. No abdominal pain.   ROS: Denies any chest pain or shortness of breath  Objective:  Vital Signs  Vitals:   06/10/16 1128 06/10/16 2028 06/10/16 2214 06/11/16 0525  BP: (!) 144/75  (!) 113/55 (!) 112/46  Pulse:   66 68  Resp:   18 18  Temp: 97.5 F (36.4 C) 97.4 F (36.3 C)  98.1 F (36.7 C)  TempSrc: Oral Oral  Oral  SpO2:   100% 97%  Weight:   90.5 kg (199 lb 8 oz)   Height:        Intake/Output Summary (Last 24 hours) at 06/11/16 1005 Last data filed at 06/11/16 0651  Gross per 24 hour  Intake              250 ml  Output              800 ml  Net             -550 ml   Filed Weights   06/09/16 7741 06/10/16 2214  Weight: 92 kg (202 lb 13.2 oz) 90.5 kg (199 lb 8 oz)    General appearance: Awake, alert. No distress Resp: Clear to auscultation bilaterally Cardio: S1, S2 is normal, regular. No S3, S4. No rubs, murmurs or bruit GI: Abdomen is soft. Nontender, nondistended. Bowel sounds are present.  No masses or organomegaly Extremities: No edema Neurologic: No focal deficit  Lab Results:  Data Reviewed: I have personally reviewed following labs and imaging studies  CBC:  Recent Labs Lab 06/09/16 0759 06/09/16 1241 06/10/16 0500 06/10/16 1121 06/11/16 0650  WBC 7.8 6.9 5.3 4.9 5.1  HGB 9.6* 9.6* 8.5* 8.3* 8.5*  HCT 29.1* 29.4* 26.0* 25.1* 25.4*  MCV 92.4 91.9 91.2 91.3 90.1  PLT 199 196 183 173 287    Basic Metabolic Panel:  Recent Labs Lab 06/09/16 0759 06/10/16 0500 06/11/16 0650  NA 139 141 140  K 4.4 3.6 3.7  CL 112* 114* 108  CO2 24 23 27   GLUCOSE 113* 96 106*  BUN 59* 30* 16  CREATININE 0.84 1.04 0.99  CALCIUM 8.2* 8.1* 8.8*    GFR: Estimated Creatinine Clearance: 79.5 mL/min (by C-G formula based on SCr of 0.99 mg/dL).  Liver Function Tests:  Recent Labs Lab 06/09/16 0759  AST 16  ALT 17  ALKPHOS 46  BILITOT 0.9  PROT 5.4*  ALBUMIN 3.0*     Coagulation Profile:  Recent Labs Lab 06/09/16 0759  INR 1.12    CBG:  Recent Labs Lab 06/10/16 2026 06/10/16 2215 06/11/16 0026  06/11/16 0445 06/11/16 0803  GLUCAP 155* 101* 131* 104* 102*     Recent Results (from the past 240 hour(s))  MRSA PCR Screening     Status: None   Collection Time: 06/09/16  6:36 AM  Result Value Ref Range Status   MRSA by PCR NEGATIVE NEGATIVE Final    Comment:        The GeneXpert MRSA Assay (FDA approved for NASAL specimens only), is one component of a comprehensive MRSA colonization surveillance program. It is not intended to diagnose MRSA infection nor to guide or monitor treatment for MRSA infections.       Radiology Studies: No results found.   Medications:  Scheduled: . insulin aspart  0-9 Units Subcutaneous Q4H  . pantoprazole  40 mg Intravenous Q12H  . sodium chloride flush  3 mL Intravenous Q12H   Continuous:  KPV:[id number] **OR** acetaminophen, hydrALAZINE  Assessment/Plan:  Principal Problem:    Hematemesis Active Problems:   Chronic back pain   COPD (chronic obstructive pulmonary disease) (HCC)   GERD (gastroesophageal reflux disease)   Hemorrhoids   Melena   Hypotension   GI bleed   Hyperglycemia   Acute blood loss anemia    Hematemesis/upper GI bleed Patient was seen by gastroenterology. He underwent EGD, which showed gastric ulcerations and esophagitis. Patient was initially placed on PPI infusion. Now on IV PPI twice a day. He did have black stool yesterday which is likely old blood. Hemoglobin is stable Plan to transition to oral PPI tomorrow and discharge if he remains stable. H pylori serology is negative. Patient denies taking NSAIDs. Denies alcohol use.  Acute blood loss anemia. Hemoglobin did trend down, but stable. Has not required any blood transfusion.  Hypotension Likely due to acute bleeding. Now resolved.   Hyperglycemia. High glucose level noted in the emergency department. Patient does not have any history of diabetes. Continue to monitor CBGs. CBGs have been normal. Elevated blood glucose level could have been due to acute stress.  History of COPD Stable.  DVT Prophylaxis: SCDs    Code Status: Full code  Family Communication: Discussed with the patient  Disposition Plan: Management as outlined above. Mobilize. Anticipate discharge tomorrow   LOS: 2 days   [place] [facility] Pager [phone] 06/11/2016, 10:05 AM  If 7PM-7AM, please contact night-coverage at [email or web address], password [email or web address]

## 2016-06-12 LAB — BASIC METABOLIC PANEL
Anion gap: 8 (ref 5–15)
BUN: 17 mg/dL (ref 6–20)
CALCIUM: 8.3 mg/dL — AB (ref 8.9–10.3)
CO2: 23 mmol/L (ref 22–32)
CREATININE: 1.14 mg/dL (ref 0.61–1.24)
Chloride: 103 mmol/L (ref 101–111)
GFR calc Af Amer: >60 mL/min (ref >60)
Glucose, Bld: 119 mg/dL — ABNORMAL HIGH (ref 65–99)
Potassium: 3.6 mmol/L (ref 3.5–5.1)
SODIUM: 134 mmol/L — AB (ref 135–145)

## 2016-06-12 LAB — CBC
HCT: 25.4 % — ABNORMAL LOW (ref 39.0–52.0)
Hemoglobin: 8.4 g/dL — ABNORMAL LOW (ref 13.0–17.0)
MCH: 29.9 pg (ref 26.0–34.0)
MCHC: 33.1 g/dL (ref 30.0–36.0)
MCV: 90.4 fL (ref 78.0–100.0)
PLATELETS: 212 10*3/uL (ref 150–400)
RBC: 2.81 MIL/uL — AB (ref 4.22–5.81)
RDW: 12.8 % (ref 11.5–15.5)
WBC: 5.3 10*3/uL (ref 4.0–10.5)

## 2016-06-12 LAB — GLUCOSE, CAPILLARY: GLUCOSE-CAPILLARY: 97 mg/dL (ref 65–99)

## 2016-06-12 LAB — HEMOGLOBIN A1C
HEMOGLOBIN A1C: 5.3 % (ref 4.8–5.6)
Mean Plasma Glucose: 105 mg/dL

## 2016-06-12 MED ORDER — PANTOPRAZOLE SODIUM 40 MG PO TBEC: 40.0000 mg | DELAYED_RELEASE_TABLET | Freq: Two times a day (BID) | ORAL | 3 refills | Status: DC

## 2016-06-12 NOTE — Progress Notes (Signed)
[name] to be D/C'd Home per MD order.  Discussed with the patient and all questions fully answered.  VSS, Skin clean, dry and intact without evidence of skin break down, no evidence of skin tears noted. IV catheter discontinued intact. Site without signs and symptoms of complications. Dressing and pressure applied.  An After Visit Summary was printed and given to the patient. Patient received prescription.  D/c education completed with patient/family including follow up instructions, medication list, d/c activities limitations if indicated, with other d/c instructions as indicated by MD - patient able to verbalize understanding, all questions fully answered.   Patient instructed to return to ED, call 911, or call MD for any changes in condition.   Patient escorted via [place], and D/C home via private auto.  Karolee Ohs 06/12/2016 3:35 PM

## 2016-06-12 NOTE — Discharge Instructions (Signed)
Peptic Ulcer  A peptic ulcer is a sore in the lining of the esophagus (esophageal ulcer), the stomach (gastric ulcer), or the first part of the small intestine (duodenal ulcer). The ulcer causes gradual wearing away (erosion) into the deeper tissue.  What are the causes?  Normally, the lining of the stomach and the small intestine protects itself from the acid that digests food. The protective lining can be damaged by:   An infection caused by a germ (bacterium) called Helicobacter pylori or H. pylori.   Regular use of NSAIDs, such as ibuprofen or aspirin.   Rare tumors in the stomach, small intestine, or pancreas (Zollinger-Ellison syndrome).    What increases the risk?  The following factors may make you more likely to develop this condition:   Smoking.   Having a family history of ulcer disease.    What are the signs or symptoms?  Symptoms of this condition include:   Burning pain or gnawing in the area between the chest and the belly button. The pain may be worse on an empty stomach and at night.   Heartburn.   Nausea and vomiting.   Bloating.    If the ulcer results in bleeding, it can cause:   Black, tarry stools.   Vomiting of bright red blood.   Vomiting of material that looks like coffee grounds.    How is this diagnosed?  This condition may be diagnosed based on:   Medical history and physical exam.   Various tests or procedures, such as:  ? Blood tests, stool tests, or breath tests to check for the H. pylori bacterium.  ? An X-ray exam (upper gastrointestinal series) of the esophagus, stomach, and small intestine.  ? Upper endoscopy. The health care provider examines the esophagus, stomach, and small intestine using a small flexible tube that has a video camera at the end.  ? Biopsy. A tissue sample is removed to be examined under a microscope.    How is this treated?  Treatment for this condition may include:   Eliminating the cause of the ulcer, such as smoking or the use of NSAIDs or  alcohol.   Medicines to reduce the amount of acid in your digestive tract.   Antibiotic medicines, if the ulcer is caused by the H. pylori bacterium.   An upper endoscopy to treat a bleeding ulcer.   Surgery, if the bleeding is severe or if the ulcer created a hole somewhere in the digestive system.    Follow these instructions at home:   Avoid alcohol and caffeine.   Do not use any tobacco products, such as cigarettes, chewing tobacco, and e-cigarettes. If you need help quitting, ask your health care provider.   Take over-the-counter and prescription medicines only as told by your health care provider. Do not use over-the-counter medicines in place of prescription medicines unless your health care provider approves.   Keep all follow-up visits as told by your health care provider. This is important.  Contact a health care provider if:   Your symptoms do not improve within 7 days of starting treatment.   You have ongoing indigestion or heartburn.  Get help right away if:   You have sudden, sharp, or persistent pain in your abdomen.   You have bloody or dark black, tarry stools.   You vomit blood or material that looks like coffee grounds.   You become light-headed or you feel faint.   You become weak.   You become sweaty or clammy.    This information is not intended to replace advice given to you by your health care provider. Make sure you discuss any questions you have with your health care provider.  Document Released: 12/26/1999 Document Revised: 06/02/2015 Document Reviewed: 09/28/2014  Elsevier Interactive Patient Education  2018 [employer]

## 2016-06-12 NOTE — Discharge Summary (Signed)
[facility]  Physician Discharge Summary   Patient ID: Todd Berg MRN: [id number] DOB/AGE: 68-Mar-1952 68 y.o.  Admit date: 06/09/2016 Discharge date: 06/12/2016  PCP: Dettinger, [name], MD  DISCHARGE DIAGNOSES:  Principal Problem:   Hematemesis Active Problems:   Chronic back pain   COPD (chronic obstructive pulmonary disease) (HCC)   GERD (gastroesophageal reflux disease)   Hemorrhoids   Melena   Hypotension   GI bleed   Hyperglycemia   Acute blood loss anemia   RECOMMENDATIONS FOR OUTPATIENT FOLLOW UP: 1. Patient will need repeat endoscopy in 3 months   DISCHARGE CONDITION: fair  Diet recommendation: As before  [facility] Weights   06/09/16 8101 06/10/16 2214  Weight: 92 kg (202 lb 13.2 oz) 90.5 kg (199 lb 8 oz)    INITIAL HISTORY: 66 year old Caucasian male with a past medical history of COPD, chronic back pain, GERD who has a history of rectal cancer status post surgery, presented with complaints of bloody emesis. He also gave a history of black colored stools. Patient was hospitalized for further management.  Consultants: Gastroenterology  Procedures:  EGD Impression:  - LA Grade A reflux esophagitis. - Z-line, 40 cm from the incisors. - Non-bleeding gastric ulcers with pigmented material. Injected. - Hematin (altered blood/coffee-ground-like material) in the gastric body. - Normal examined duodenum. - No specimens collected.   HOSPITAL COURSE:   Hematemesis/upper GI bleed Patient was seen by gastroenterology. He underwent EGD, which showed gastric ulcerations and esophagitis. Patient was initially placed on PPI infusion. No further recurrence of bleeding. Hemoglobin is stable. Patient was transitioned to oral Protonix. Plan is for him to take this twice a day for 3 months. He will need repeat upper endoscopy in 3 months. H. pylori serology is negative. Patient denies taking NSAIDs. Denies alcohol use.  Acute blood loss  anemia. Hemoglobin did trend down, but stable. Did not require any blood transfusion.  Hypotension Likely due to acute bleeding. Now resolved.   Hyperglycemia. High glucose level noted in the emergency department. Patient does not have any history of diabetes. Subsequent CBGs have been normal. Elevated blood glucose level could have been due to acute stress.  History of COPD Stable.  Overall, stable. Okay for discharge.   PERTINENT LABS:  The results of significant diagnostics from this hospitalization (including imaging, microbiology, ancillary and laboratory) are listed below for reference.    Microbiology: Recent Results (from the past 240 hour(s))  MRSA PCR Screening     Status: None   Collection Time: 06/09/16  6:36 AM  Result Value Ref Range Status   MRSA by PCR NEGATIVE NEGATIVE Final    Comment:        The GeneXpert MRSA Assay (FDA approved for NASAL specimens only), is one component of a comprehensive MRSA colonization surveillance program. It is not intended to diagnose MRSA infection nor to guide or monitor treatment for MRSA infections.      Labs: Basic Metabolic Panel:  Recent Labs Lab 06/09/16 0759 06/10/16 0500 06/11/16 0650 06/12/16 0555  NA 139 141 140 134*  K 4.4 3.6 3.7 3.6  CL 112* 114* 108 103  CO2 24 23 27 23   GLUCOSE 113* 96 106* 119*  BUN 59* 30* 16 17  CREATININE 0.84 1.04 0.99 1.14  CALCIUM 8.2* 8.1* 8.8* 8.3*   Liver Function Tests:  Recent Labs Lab 06/09/16 0759  AST 16  ALT 17  ALKPHOS 46  BILITOT 0.9  PROT 5.4*  ALBUMIN 3.0*   CBC:  Recent Labs  Lab 06/09/16 1241 06/10/16 0500 06/10/16 1121 06/11/16 0650 06/12/16 0555  WBC 6.9 5.3 4.9 5.1 5.3  HGB 9.6* 8.5* 8.3* 8.5* 8.4*  HCT 29.4* 26.0* 25.1* 25.4* 25.4*  MCV 91.9 91.2 91.3 90.1 90.4  PLT 196 183 173 198 212    CBG:  Recent Labs Lab 06/11/16 0803 06/11/16 1220 06/11/16 1639 06/11/16 2131 06/12/16 0755  GLUCAP 102* 97 106* 124* 97      DISCHARGE EXAMINATION: Vitals:   06/11/16 1401 06/11/16 2132 06/12/16 0510 06/12/16 1301  BP: 131/75 123/62 126/68 (!) 146/67  Pulse: 68 71 71 65  Resp: 17 18 18  (!) 22  Temp: 97.8 F (36.6 C) 98.6 F (37 C) 98.6 F (37 C) 98.1 F (36.7 C)  TempSrc: Oral   Oral  SpO2: 99% 98% 96% 98%  Weight:      Height:       General appearance: alert, cooperative, appears stated age and no distress Resp: clear to auscultation bilaterally Cardio: regular rate and rhythm, S1, S2 normal, no murmur, click, rub or gallop GI: soft, non-tender; bowel sounds normal; no masses,  no organomegaly Extremities: extremities normal, atraumatic, no cyanosis or edema  DISPOSITION: Home  Discharge Instructions    Call MD for:  difficulty breathing, headache or visual disturbances    Complete by:  As directed    Call MD for:  extreme fatigue    Complete by:  As directed    Call MD for:  persistant dizziness or light-headedness    Complete by:  As directed    Call MD for:  persistant nausea and vomiting    Complete by:  As directed    Call MD for:  severe uncontrolled pain    Complete by:  As directed    Call MD for:  temperature >100.4    Complete by:  As directed    Discharge instructions    Complete by:  As directed    Please be sure to follow up with your PCP in 1-2 weeks. Seek attention immediately if you bleed again. You will need repeat endoscopy in 3 months.  You were cared for by a hospitalist during your hospital stay. If you have any questions about your discharge medications or the care you received while you were in the hospital after you are discharged, you can call the unit and asked to speak with the hospitalist on call if the hospitalist that took care of you is not available. Once you are discharged, your primary care physician will handle any further medical issues. Please note that NO REFILLS for any discharge medications will be authorized once you are discharged, as it is  imperative that you return to your primary care physician (or establish a relationship with a primary care physician if you do not have one) for your aftercare needs so that they can reassess your need for medications and monitor your lab values. If you do not have a primary care physician, you can call [phone] for a physician referral.   Increase activity slowly    Complete by:  As directed       ALLERGIES: No Known Allergies   Discharge Medication List as of 06/12/2016 12:03 PM    START taking these medications   Details  pantoprazole (PROTONIX) 40 MG tablet Take 1 tablet (40 mg total) by mouth 2 (two) times daily before a meal., Starting Sat 06/12/2016, Print         Follow-up Information    Dettinger, [name], MD.  Schedule an appointment as soon as possible for a visit in 1 week(s).   Specialties:  Family Medicine, Cardiology Contact information: [place] [address] [phone]        Wilford Corner, MD. Schedule an appointment as soon as possible for a visit in 3 month(s).   Specialty:  Gastroenterology Contact information: [address] [place] [address] [phone]           TOTAL DISCHARGE TIME: 35 mins  [facility] Pager [phone]  06/12/2016, 4:45 PM

## 2016-06-17 ENCOUNTER — Encounter: Payer: Self-pay | Admitting: Family Medicine

## 2016-06-17 ENCOUNTER — Ambulatory Visit (INDEPENDENT_AMBULATORY_CARE_PROVIDER_SITE_OTHER): Payer: Medicare HMO | Admitting: Family Medicine

## 2016-06-17 VITALS — BP 126/62 | HR 79 | Temp 98.1°F | Ht 72.0 in | Wt 203.0 lb

## 2016-06-17 DIAGNOSIS — K921 Melena: Secondary | ICD-10-CM

## 2016-06-17 DIAGNOSIS — K920 Hematemesis: Secondary | ICD-10-CM

## 2016-06-18 ENCOUNTER — Other Ambulatory Visit: Payer: Self-pay

## 2016-06-18 DIAGNOSIS — D62 Acute posthemorrhagic anemia: Secondary | ICD-10-CM

## 2016-06-18 LAB — CBC WITH DIFFERENTIAL/PLATELET
BASOS ABS: 0.1 10*3/uL (ref 0.0–0.2)
Basos: 1 %
EOS (ABSOLUTE): 0.1 10*3/uL (ref 0.0–0.4)
Eos: 2 %
Hematocrit: 27 % — ABNORMAL LOW (ref 37.5–51.0)
Hemoglobin: 8.9 g/dL — CL (ref 13.0–17.7)
IMMATURE GRANS (ABS): 0 10*3/uL (ref 0.0–0.1)
IMMATURE GRANULOCYTES: 0 %
LYMPHS: 23 %
Lymphocytes Absolute: 1.3 10*3/uL (ref 0.7–3.1)
MCH: 30.5 pg (ref 26.6–33.0)
MCHC: 33 g/dL (ref 31.5–35.7)
MCV: 93 fL (ref 79–97)
Monocytes Absolute: 0.4 10*3/uL (ref 0.1–0.9)
Monocytes: 7 %
Neutrophils Absolute: 3.8 10*3/uL (ref 1.4–7.0)
Neutrophils: 67 %
PLATELETS: 313 10*3/uL (ref 150–379)
RBC: 2.92 x10E6/uL — AB (ref 4.14–5.80)
RDW: 13.6 % (ref 12.3–15.4)
WBC: 5.7 10*3/uL (ref 3.4–10.8)

## 2016-06-23 NOTE — Progress Notes (Signed)
BP 126/62   Pulse 79   Temp 98.1 F (36.7 C) (Oral)   Ht 6' (1.829 m)   Wt 203 lb (92.1 kg)   BMI 27.53 kg/m    Subjective:    Patient ID: Todd Berg, male    DOB: September 23, 1950, 66 y.o.   MRN: [id number]  HPI: Todd Berg is a 66 y.o. male presenting on 06/17/2016 for Hospital followup   HPI Hospital follow-up for melena and hematemesis. Patient is coming in for hospital follow-up for melena and hematemesis, he was in hospital on 06/09/2016 and discharged on 06/12/2016. His blood counts prior to hospitalization were in the 12th 13 range and during the hospitalization and he will was an 8 hemoglobin range. Patient had an EGD during the hospital and found nonbleeding gastric ulcers and they also recommended for colonoscopy in the future. They gave him oral Protonix after a PPI infusion and recommended for him to stay on it. He also recommended for him to follow up for an EGD in 3 months. He is coming in here to our office today to have things rechecked in the make sure he is no longer further losing blood. His hemoglobin on discharge was 8.4. Since leaving the hospital he has not had any more complaints of melena or hematemesis and denies any abdominal pain and is taking his Protonix twice daily and really overall feels well except slightly fatigued he denies any abdominal pain or nausea or vomiting or constipation or diarrhea. He denies any chest pain or lightheadedness or dizziness.  Relevant past medical, surgical, family and social history reviewed and updated as indicated. Interim medical history since our last visit reviewed. Allergies and medications reviewed and updated.  Review of Systems  Constitutional: Positive for fatigue. Negative for chills and fever.  Eyes: Negative for discharge.  Respiratory: Negative for shortness of breath and wheezing.   Cardiovascular: Negative for chest pain and leg swelling.  Gastrointestinal: Negative for abdominal pain, anal bleeding,  blood in stool, constipation, diarrhea, nausea and vomiting.  Musculoskeletal: Negative for back pain and gait problem.  Skin: Negative for rash.  Neurological: Negative for dizziness, weakness, light-headedness and numbness.  All other systems reviewed and are negative.   Per HPI unless specifically indicated above   Allergies as of 06/17/2016   No Known Allergies     Medication List       Accurate as of 06/17/16 11:59 PM. Always use your most recent med list.          pantoprazole 40 MG tablet Commonly known as:  PROTONIX Take 1 tablet (40 mg total) by mouth 2 (two) times daily before a meal.          Objective:    BP 126/62   Pulse 79   Temp 98.1 F (36.7 C) (Oral)   Ht 6' (1.829 m)   Wt 203 lb (92.1 kg)   BMI 27.53 kg/m   Wt Readings from Last 3 Encounters:  06/17/16 203 lb (92.1 kg)  06/10/16 199 lb 8 oz (90.5 kg)  05/21/16 204 lb (92.5 kg)    Physical Exam  Constitutional: He is oriented to person, place, and time. He appears well-developed and well-nourished. No distress.  Eyes: Conjunctivae are normal. No scleral icterus.  Cardiovascular: Normal rate, regular rhythm, normal heart sounds and intact distal pulses.   No murmur heard. Pulmonary/Chest: Effort normal and breath sounds normal. No respiratory distress. He has no wheezes. He has no rales.  Abdominal: Soft. Bowel  sounds are normal. He exhibits no distension. There is no tenderness. There is no rebound and no guarding.  Musculoskeletal: Normal range of motion. He exhibits no edema.  Neurological: He is alert and oriented to person, place, and time. Coordination normal.  Skin: Skin is warm and dry. No rash noted. He is not diaphoretic.  Psychiatric: He has a normal mood and affect. His behavior is normal.  Nursing note and vitals reviewed.       Assessment & Plan:   Problem List Items Addressed This Visit      Digestive   Hematemesis - Primary   Relevant Orders   CBC with  Differential/Platelet (Completed)   Melena   Relevant Orders   CBC with Differential/Platelet (Completed)       Follow up plan: Return if symptoms worsen or fail to improve.  Counseling provided for all of the vaccine components Orders Placed This Encounter  Procedures  . CBC with Differential/Platelet    Caryl Pina, MD [facility] 06/23/2016, 1:07 PM

## 2016-07-11 DIAGNOSIS — Z836 Family history of other diseases of the respiratory system: Secondary | ICD-10-CM | POA: Diagnosis not present

## 2016-07-11 DIAGNOSIS — Z85048 Personal history of other malignant neoplasm of rectum, rectosigmoid junction, and anus: Secondary | ICD-10-CM | POA: Diagnosis not present

## 2016-07-11 DIAGNOSIS — K029 Dental caries, unspecified: Secondary | ICD-10-CM | POA: Diagnosis not present

## 2016-07-11 DIAGNOSIS — J449 Chronic obstructive pulmonary disease, unspecified: Secondary | ICD-10-CM | POA: Diagnosis not present

## 2016-07-11 DIAGNOSIS — Z87891 Personal history of nicotine dependence: Secondary | ICD-10-CM | POA: Diagnosis not present

## 2016-07-11 DIAGNOSIS — K0889 Other specified disorders of teeth and supporting structures: Secondary | ICD-10-CM | POA: Diagnosis not present

## 2016-07-26 ENCOUNTER — Ambulatory Visit: Payer: Medicare HMO | Admitting: Family Medicine

## 2016-07-27 ENCOUNTER — Telehealth: Payer: Self-pay | Admitting: Family Medicine

## 2016-07-27 ENCOUNTER — Encounter: Payer: Self-pay | Admitting: Family Medicine

## 2016-07-28 NOTE — Telephone Encounter (Signed)
[email or web address] if he would like to reschedule appointment

## 2016-11-20 ENCOUNTER — Observation Stay (HOSPITAL_COMMUNITY)
Admission: EM | Admit: 2016-11-20 | Discharge: 2016-11-21 | Disposition: A | Discharge status: Home / Self Care | Payer: Medicare Other | Attending: Family Medicine | Admitting: Family Medicine

## 2016-11-20 ENCOUNTER — Encounter: Payer: Self-pay | Admitting: Family Medicine

## 2016-11-20 ENCOUNTER — Encounter (HOSPITAL_COMMUNITY): Payer: Self-pay | Admitting: Emergency Medicine

## 2016-11-20 ENCOUNTER — Ambulatory Visit (INDEPENDENT_AMBULATORY_CARE_PROVIDER_SITE_OTHER): Payer: Medicare Other | Admitting: Family Medicine

## 2016-11-20 ENCOUNTER — Other Ambulatory Visit: Payer: Self-pay

## 2016-11-20 ENCOUNTER — Emergency Department (HOSPITAL_COMMUNITY): Payer: Medicare Other

## 2016-11-20 VITALS — BP 136/75 | HR 71 | Temp 98.0°F | Ht 72.0 in | Wt 203.0 lb

## 2016-11-20 DIAGNOSIS — K649 Unspecified hemorrhoids: Secondary | ICD-10-CM | POA: Diagnosis present

## 2016-11-20 DIAGNOSIS — K2970 Gastritis, unspecified, without bleeding: Secondary | ICD-10-CM | POA: Insufficient documentation

## 2016-11-20 DIAGNOSIS — R195 Other fecal abnormalities: Secondary | ICD-10-CM

## 2016-11-20 DIAGNOSIS — Z87891 Personal history of nicotine dependence: Secondary | ICD-10-CM | POA: Diagnosis not present

## 2016-11-20 DIAGNOSIS — M549 Dorsalgia, unspecified: Secondary | ICD-10-CM | POA: Insufficient documentation

## 2016-11-20 DIAGNOSIS — K219 Gastro-esophageal reflux disease without esophagitis: Secondary | ICD-10-CM | POA: Diagnosis not present

## 2016-11-20 DIAGNOSIS — D508 Other iron deficiency anemias: Secondary | ICD-10-CM | POA: Insufficient documentation

## 2016-11-20 DIAGNOSIS — K222 Esophageal obstruction: Secondary | ICD-10-CM | POA: Insufficient documentation

## 2016-11-20 DIAGNOSIS — J449 Chronic obstructive pulmonary disease, unspecified: Secondary | ICD-10-CM | POA: Diagnosis not present

## 2016-11-20 DIAGNOSIS — D500 Iron deficiency anemia secondary to blood loss (chronic): Secondary | ICD-10-CM | POA: Diagnosis present

## 2016-11-20 DIAGNOSIS — K449 Diaphragmatic hernia without obstruction or gangrene: Secondary | ICD-10-CM | POA: Diagnosis not present

## 2016-11-20 DIAGNOSIS — G8929 Other chronic pain: Secondary | ICD-10-CM | POA: Insufficient documentation

## 2016-11-20 DIAGNOSIS — K259 Gastric ulcer, unspecified as acute or chronic, without hemorrhage or perforation: Secondary | ICD-10-CM | POA: Insufficient documentation

## 2016-11-20 DIAGNOSIS — K2100 Gastro-esophageal reflux disease with esophagitis, without bleeding: Secondary | ICD-10-CM

## 2016-11-20 DIAGNOSIS — K921 Melena: Secondary | ICD-10-CM | POA: Diagnosis not present

## 2016-11-20 DIAGNOSIS — D62 Acute posthemorrhagic anemia: Secondary | ICD-10-CM | POA: Insufficient documentation

## 2016-11-20 DIAGNOSIS — R1013 Epigastric pain: Secondary | ICD-10-CM | POA: Insufficient documentation

## 2016-11-20 DIAGNOSIS — K922 Gastrointestinal hemorrhage, unspecified: Secondary | ICD-10-CM | POA: Diagnosis present

## 2016-11-20 LAB — CBC
HCT: 35.3 % — ABNORMAL LOW (ref 39.0–52.0)
HCT: 34.2 % — ABNORMAL LOW (ref 39.0–52.0)
HEMOGLOBIN: 10.4 g/dL — AB (ref 13.0–17.0)
Hemoglobin: 10.1 g/dL — ABNORMAL LOW (ref 13.0–17.0)
MCH: 23.1 pg — ABNORMAL LOW (ref 26.0–34.0)
MCH: 23.1 pg — AB (ref 26.0–34.0)
MCHC: 29.5 g/dL — AB (ref 30.0–36.0)
MCHC: 29.5 g/dL — ABNORMAL LOW (ref 30.0–36.0)
MCV: 78.4 fL (ref 78.0–100.0)
MCV: 78.3 fL (ref 78.0–100.0)
PLATELETS: 244 10*3/uL (ref 150–400)
PLATELETS: 215 10*3/uL (ref 150–400)
RBC: 4.5 MIL/uL (ref 4.22–5.81)
RBC: 4.37 MIL/uL (ref 4.22–5.81)
RDW: 16.5 % — ABNORMAL HIGH (ref 11.5–15.5)
RDW: 16.4 % — AB (ref 11.5–15.5)
WBC: 6.8 10*3/uL (ref 4.0–10.5)
WBC: 5.9 10*3/uL (ref 4.0–10.5)

## 2016-11-20 LAB — COMPREHENSIVE METABOLIC PANEL
ALK PHOS: 64 U/L (ref 38–126)
ALT: 14 U/L — ABNORMAL LOW (ref 17–63)
ANION GAP: 6 (ref 5–15)
AST: 16 U/L (ref 15–41)
Albumin: 3.6 g/dL (ref 3.5–5.0)
BILIRUBIN TOTAL: 0.5 mg/dL (ref 0.3–1.2)
BUN: 25 mg/dL — ABNORMAL HIGH (ref 6–20)
CO2: 25 mmol/L (ref 22–32)
CREATININE: 1.05 mg/dL (ref 0.61–1.24)
Calcium: 9 mg/dL (ref 8.9–10.3)
Chloride: 105 mmol/L (ref 101–111)
GFR calc non Af Amer: >60 mL/min (ref >60)
Glucose, Bld: 99 mg/dL (ref 65–99)
Potassium: 3.9 mmol/L (ref 3.5–5.1)
Sodium: 136 mmol/L (ref 135–145)
TOTAL PROTEIN: 6.7 g/dL (ref 6.5–8.1)

## 2016-11-20 LAB — HEMOGLOBIN AND HEMATOCRIT, BLOOD
HCT: 32.5 % — ABNORMAL LOW (ref 39.0–52.0)
HEMOGLOBIN: 9.7 g/dL — AB (ref 13.0–17.0)

## 2016-11-20 LAB — TYPE AND SCREEN
ABO/RH(D): O NEG
ANTIBODY SCREEN: NEGATIVE

## 2016-11-20 LAB — POC OCCULT BLOOD, ED: FECAL OCCULT BLD: POSITIVE — AB

## 2016-11-20 LAB — HEMOGLOBIN, FINGERSTICK: Hemoglobin: 11.2 g/dL — ABNORMAL LOW (ref 12.6–17.7)

## 2016-11-20 MED ORDER — ONDANSETRON HCL 4 MG PO TABS: 4.0000 mg | ORAL_TABLET | Freq: Four times a day (QID) | ORAL | Status: DC | PRN

## 2016-11-20 MED ORDER — ALBUTEROL SULFATE (2.5 MG/3ML) 0.083% IN NEBU: 2.5000 mg | INHALATION_SOLUTION | RESPIRATORY_TRACT | Status: DC | PRN

## 2016-11-20 MED ORDER — POLYETHYLENE GLYCOL 3350 17 G PO PACK: 17.0000 g | PACK | Freq: Every day | ORAL | Status: DC | PRN

## 2016-11-20 MED ORDER — SODIUM CHLORIDE 0.9 % IV SOLN: 250.0000 mL | INTRAVENOUS | Status: DC | PRN

## 2016-11-20 MED ORDER — ONDANSETRON HCL 4 MG/2ML IJ SOLN
4.0000 mg | Freq: Once | INTRAMUSCULAR | Status: AC
  Administered 2016-11-20: 4 mg via INTRAVENOUS
  Filled 2016-11-20: qty 2

## 2016-11-20 MED ORDER — SODIUM CHLORIDE 0.9% FLUSH: 3.0000 mL | INTRAVENOUS | Status: DC | PRN

## 2016-11-20 MED ORDER — SODIUM CHLORIDE 0.9 % IV SOLN
INTRAVENOUS | Status: DC
  Administered 2016-11-20: 18:00:00 via INTRAVENOUS

## 2016-11-20 MED ORDER — SODIUM CHLORIDE 0.9 % IV SOLN
80.0000 mg | Freq: Once | INTRAVENOUS | Status: AC
  Administered 2016-11-20: 80 mg via INTRAVENOUS
  Filled 2016-11-20 (×2): qty 80

## 2016-11-20 MED ORDER — TRAZODONE HCL 50 MG PO TABS: 50.0000 mg | ORAL_TABLET | Freq: Every evening | ORAL | Status: DC | PRN

## 2016-11-20 MED ORDER — IOPAMIDOL (ISOVUE-300) INJECTION 61%
INTRAVENOUS | Status: AC
  Administered 2016-11-20: 100 mL
  Filled 2016-11-20: qty 100

## 2016-11-20 MED ORDER — ACETAMINOPHEN 650 MG RE SUPP: 650.0000 mg | Freq: Four times a day (QID) | RECTAL | Status: DC | PRN

## 2016-11-20 MED ORDER — ONDANSETRON HCL 4 MG/2ML IJ SOLN
4.0000 mg | Freq: Four times a day (QID) | INTRAMUSCULAR | Status: DC | PRN
  Administered 2016-11-21: 4 mg via INTRAVENOUS

## 2016-11-20 MED ORDER — PANTOPRAZOLE SODIUM 40 MG IV SOLR
40.0000 mg | Freq: Two times a day (BID) | INTRAVENOUS | Status: DC
  Administered 2016-11-20 – 2016-11-21 (×3): 40 mg via INTRAVENOUS
  Filled 2016-11-20 (×3): qty 40

## 2016-11-20 MED ORDER — SODIUM CHLORIDE 0.9 % IV BOLUS (SEPSIS)
1000.0000 mL | Freq: Once | INTRAVENOUS | Status: AC
  Administered 2016-11-20: 1000 mL via INTRAVENOUS

## 2016-11-20 MED ORDER — ACETAMINOPHEN 325 MG PO TABS: 650.0000 mg | ORAL_TABLET | Freq: Four times a day (QID) | ORAL | Status: DC | PRN

## 2016-11-20 MED ORDER — SODIUM CHLORIDE 0.9% FLUSH
3.0000 mL | Freq: Two times a day (BID) | INTRAVENOUS | Status: DC
  Administered 2016-11-20 – 2016-11-21 (×3): 3 mL via INTRAVENOUS

## 2016-11-20 MED ORDER — SENNA 8.6 MG PO TABS
1.0000 | ORAL_TABLET | Freq: Two times a day (BID) | ORAL | Status: DC
  Administered 2016-11-20 – 2016-11-21 (×2): 8.6 mg via ORAL
  Filled 2016-11-20 (×2): qty 1

## 2016-11-20 NOTE — Plan of Care (Signed)
Aware of patient's admission.  Plan for EGD tomorrow morning.  For now:  PPI, follow CBCs, clear liquids (but NPO after midnight), hydration.

## 2016-11-20 NOTE — ED Triage Notes (Signed)
Pt to ED from [facility] with c/o gi bleed, black tarry stools for 6 days, with dizziness

## 2016-11-20 NOTE — ED Provider Notes (Signed)
[facility] EMERGENCY DEPARTMENT Provider Note   CSN: [id number] Arrival date & time: 11/20/16  1123     History   Chief Complaint Chief Complaint  Patient presents with  . GI Bleeding    HPI Todd Berg is a 66 y.o. male w/ h/o GERD, hemorrhoids, GI bleed, gastric ulcers, esophagitis, remote ETOH abuse presents to ED for evaluation of black formed BMs x 6 days associated with mild diffuse lower abdominal discomfort and nausea. Developed generalized weakness and light-headedness with standing this morning. He is having 2-3 BMs a day. No fevers, chills, CP, SOB, vomiting, diarrhea, unintentional weight loss. Was seen by PCP and sent to ED for evaluation.   Last colonoscopy 2017, normal.  HPI  Past Medical History:  Diagnosis Date  . Cancer ([place]) 2015   rectal  . Chronic back pain   . COPD (chronic obstructive pulmonary disease) ([place])   . GERD (gastroesophageal reflux disease)   . Hematemesis   . Hemorrhoids   . Hypotension   . Melena     Patient Active Problem List   Diagnosis Date Noted  . GI bleed 06/09/2016  . Hyperglycemia 06/09/2016  . Acute blood loss anemia 06/09/2016  . Chronic back pain   . COPD (chronic obstructive pulmonary disease) ([place])   . GERD (gastroesophageal reflux disease)   . Hemorrhoids   . Melena   . Hypotension   . History of rectal cancer 09/13/2015    Past Surgical History:  Procedure Laterality Date  . BACK SURGERY    . HEMORRHOID SURGERY    . HERNIA REPAIR    . RECTAL SURGERY         Home Medications    Prior to Admission medications   Medication Sig Start Date End Date Taking? Authorizing Provider  pantoprazole (PROTONIX) 40 MG tablet Take 1 tablet (40 mg total) by mouth 2 (two) times daily before a meal. 06/12/16   Bonnielee Haff, MD    Family History Family History  Problem Relation Age of Onset  . COPD Mother   . Stroke Father     Social History Social History   Tobacco Use  . Smoking  status: Former Smoker    Start date: 12/30/1964    Last attempt to quit: 06/19/2009    Years since quitting: 7.4  . Smokeless tobacco: Former Systems developer    Types: Snuff    Quit date: 09/26/2005  Substance Use Topics  . Alcohol use: No  . Drug use: No     Allergies   Patient has no known allergies.   Review of Systems Review of Systems  Gastrointestinal: Positive for abdominal pain and blood in stool.  All other systems reviewed and are negative.    Physical Exam Updated Vital Signs BP (!) 156/78   Pulse 61   Temp 97.9 F (36.6 C) (Oral)   Resp 20   Ht 6' (1.829 m)   Wt 92.1 kg (203 lb)   SpO2 100%   BMI 27.53 kg/m   Physical Exam  Constitutional: He is oriented to person, place, and time. He appears well-developed and well-nourished. No distress.  NAD.  HENT:  Head: Normocephalic and atraumatic.  Right Ear: External ear normal.  Left Ear: External ear normal.  Nose: Nose normal.  Moist mucous membranes  Eyes: Conjunctivae and EOM are normal. No scleral icterus.  No conjunctival pallor  Neck: Normal range of motion. Neck supple.  Cardiovascular: Normal rate, regular rhythm, normal heart sounds and intact distal  pulses.  No murmur heard. 2+ DP and radial pulses bilaterally  Pulmonary/Chest: Effort normal and breath sounds normal. He has no wheezes.  Abdominal: Soft. There is tenderness.  Diffuse lower abdominal tenderness No G/R/R or CVAT Soft non distended  Negative Murphy's  Musculoskeletal: Normal range of motion. He exhibits no deformity.  Neurological: He is alert and oriented to person, place, and time.  Skin: Skin is warm and dry. Capillary refill takes less than 2 seconds.  Psychiatric: He has a normal mood and affect. His behavior is normal. Judgment and thought content normal.  Nursing note and vitals reviewed.    ED Treatments / Results  Labs (all labs ordered are listed, but only abnormal results are displayed) Labs Reviewed  COMPREHENSIVE  METABOLIC PANEL - Abnormal; Notable for the following components:      Result Value   BUN 25 (*)    ALT 14 (*)    All other components within normal limits  CBC - Abnormal; Notable for the following components:   Hemoglobin 10.4 (*)    HCT 35.3 (*)    MCH 23.1 (*)    MCHC 29.5 (*)    RDW 16.5 (*)    All other components within normal limits  HEMOGLOBIN AND HEMATOCRIT, BLOOD - Abnormal; Notable for the following components:   Hemoglobin 9.7 (*)    HCT 32.5 (*)    All other components within normal limits  POC OCCULT BLOOD, ED - Abnormal; Notable for the following components:   Fecal Occult Bld POSITIVE (*)    All other components within normal limits  TYPE AND SCREEN    EKG  EKG Interpretation None       Radiology Ct Abdomen Pelvis W Contrast  Result Date: 11/20/2016 CLINICAL DATA:  Extreme weakness. Black stools. History of rectal cancer. EXAM: CT ABDOMEN AND PELVIS WITH CONTRAST TECHNIQUE: Multidetector CT imaging of the abdomen and pelvis was performed using the standard protocol following bolus administration of intravenous contrast. CONTRAST:  16mL ISOVUE-300 IOPAMIDOL (ISOVUE-300) INJECTION 61% COMPARISON:  None. FINDINGS: Lower chest: Small hiatal hernia. Hepatobiliary: 8 mm too small to be actually characterized circumscribed hypoattenuated lesion in the caudate lobe of the liver. The gallbladder is decompressed. There is a 4 mm polyp versus calculus adherent to the counter dependent wall of the gallbladder. Pancreas: Unremarkable. No pancreatic ductal dilatation or surrounding inflammatory changes. Spleen: Normal in size without focal abnormality. Adrenals/Urinary Tract: Adrenal glands are unremarkable. Kidneys are normal, without renal calculi, focal lesion, or hydronephrosis. Bladder is unremarkable. Incidental note of benign-appearing left renal cyst. Stomach/Bowel: Stomach is within normal limits. Appendix appears normal. No evidence of bowel wall thickening, distention,  or inflammatory changes. Scattered colonic diverticulosis. Vascular/Lymphatic: No significant vascular findings are present. No enlarged abdominal or pelvic lymph nodes. Reproductive: Enlarged heterogeneous prostate 4.9 cm. Other: No abdominal wall hernia or abnormality. No abdominopelvic ascites. Musculoskeletal: L5-S1 fusion. IMPRESSION: Small hiatal hernia. No apparent by CT colonic or small bowel masses. 8 mm too small to be actually characterized nodule in the liver. 4 mm gallbladder calculus versus polyp. Electronically Signed   By: Fidela Salisbury M.D.   On: 11/20/2016 13:53    Procedures Procedures (including critical care time)  Medications Ordered in ED Medications  sodium chloride 0.9 % bolus 1,000 mL (0 mLs Intravenous Stopped 11/20/16 1405)  pantoprazole (PROTONIX) 80 mg in sodium chloride 0.9 % 100 mL IVPB (0 mg Intravenous Stopped 11/20/16 1405)  ondansetron (ZOFRAN) injection 4 mg (4 mg Intravenous Given 11/20/16 1220)  iopamidol (ISOVUE-300) 61 % injection (100 mLs  Contrast Given 11/20/16 1306)     Initial Impression / Assessment and Plan / ED Course  I have reviewed the triage vital signs and the nursing notes.  Pertinent labs & imaging results that were available during my care of the patient were reviewed by me and considered in my medical decision making (see chart for details).  Clinical Course as of Nov 20 1513  Sat Nov 20, 2016  1416 Repeat abdominal exam unchanged. No n/v or BMs in Ed.   [CG]    Clinical Course User Index [CG] Kinnie Feil, PA-C   65 yo male presents with melena x 6 days. Light-headedness, generalized weakness and headache onset today. He has h/o gastric ulcers, esophagitis and hematemesis. No CP, SOB, testicular pain, inguinal bulging, hematemesis, anticoagulants or abdominal surgeries. Remote ETOH abuse, denies recent.   On exam he is non toxic. VS WNL. He has diffuse lower abdominal tenderness w/o peritoneal signs. Melenotic stool  on DRE.   Lab work remarkable for anemia Hgb 10.4, 11.2 at PCP office earlier today. Given 1 L IVF and protonix. Pending CT A/P.  Final Clinical Impressions(s) / ED Diagnoses   CT unremarkable. Pending GI consult.   1515: Spoke to Dr Paulita Fujita (GI) who recommends admission for EGD likely tomorrow. Spoke with hospitalist who will admit. Pt agreeable with plan. Repeat Hgb 9.7.  Final diagnoses:  Melena    ED Discharge Orders    None       Kinnie Feil, PA-C 11/20/16 1516    Malvin Johns, MD 11/20/16 1517

## 2016-11-20 NOTE — Progress Notes (Signed)
BP 136/75   Pulse 71   Temp 98 F (36.7 C) (Oral)   Ht 6' (1.829 m)   Wt 203 lb (92.1 kg)   BMI 27.53 kg/m    Subjective:    Patient ID: Todd Berg, male    DOB: Apr 26, 1950, 66 y.o.   MRN: [id number]  HPI: Todd Berg is a 66 y.o. male presenting on 11/20/2016 for Dark stools, weakness (x 6 days; history of GI bleed, last in May 2018)   HPI Dark tarry stools and weakness Patient comes in with complaints of dark tarry stools for 6 days and developing weakness overnight.  He just feels weak and shaky and jittery and ill and just does not feel like himself.  His wife is here with him and says that she had trouble even getting him to the car to come see [place] today and he is normally ambulatory on his own.  He has a history of a GI bleed in May where his blood counts dipped enough that he had to have an EGD to cauterize.  He has been taking his Protonix most of the time and only misses doses rarely.  He denies any nausea or vomiting or abdominal pain or back pain currently but just has the dark tarry stools and weakness and just feels generally ill.  He denies any other urinary symptoms or diarrhea or fevers or chills.  He denies any new rashes or cough or congestion or sinus pressure or shortness of breath or wheezing.  He normally has a right hand essential tremor but now he just feels so weak that his whole body is shaking at times.  Relevant past medical, surgical, family and social history reviewed and updated as indicated. Interim medical history since our last visit reviewed. Allergies and medications reviewed and updated.  Review of Systems  Constitutional: Positive for fatigue. Negative for chills and fever.  Eyes: Negative for discharge.  Respiratory: Negative for shortness of breath and wheezing.   Cardiovascular: Negative for chest pain and leg swelling.  Gastrointestinal: Positive for blood in stool. Negative for abdominal pain, anal bleeding, constipation, diarrhea,  nausea and vomiting.  Genitourinary: Negative for difficulty urinating, dysuria, flank pain and frequency.  Musculoskeletal: Negative for back pain and gait problem.  Skin: Positive for color change (Ashen color). Negative for rash.  Neurological: Positive for tremors and weakness.  All other systems reviewed and are negative.   Per HPI unless specifically indicated above     Objective:    BP 136/75   Pulse 71   Temp 98 F (36.7 C) (Oral)   Ht 6' (1.829 m)   Wt 203 lb (92.1 kg)   BMI 27.53 kg/m   Wt Readings from Last 3 Encounters:  11/20/16 203 lb (92.1 kg)  06/17/16 203 lb (92.1 kg)  06/10/16 199 lb 8 oz (90.5 kg)    Physical Exam  Constitutional: He is oriented to person, place, and time. He appears well-developed and well-nourished. No distress.  Eyes: Conjunctivae are normal. No scleral icterus.  Cardiovascular: Normal rate, regular rhythm, normal heart sounds and intact distal pulses.  No murmur heard. Pulmonary/Chest: Effort normal and breath sounds normal. No respiratory distress. He has no wheezes. He has no rales.  Abdominal: Soft. Bowel sounds are normal. He exhibits no distension. There is no tenderness. There is no rebound and no guarding.  Musculoskeletal: Normal range of motion. He exhibits no edema or tenderness.  Neurological: He is alert and oriented to person,  place, and time. Coordination normal.  Skin: Skin is warm and dry. No rash noted. He is not diaphoretic.  Psychiatric: He has a normal mood and affect. His behavior is normal.  Nursing note and vitals reviewed.  Stat hemoglobin 11.2    Assessment & Plan:   Problem List Items Addressed This Visit      Digestive   GERD (gastroesophageal reflux disease)   Melena    Other Visit Diagnoses    Dark stools    -  Primary   Relevant Orders   Hemoglobin, fingerstick      Patient has been very shaky and weak since yesterday and has been having dark tarry stools for a week.  Because of his history  and how quickly the course changed previously and his symptoms we directed him to go to the emergency department via own transportation and at least be monitored overnight to make sure that his blood counts do not dip rapidly.  He feels so weak to the point that he has trouble even walking at this point.  There is concerned that his hemoglobin may dip quickly over the next 24 hours like it had previously.  He is planning to go to come in the emergency department  Follow up plan: Return if symptoms worsen or fail to improve, for Commended to go to the emergency department.  Counseling provided for all of the vaccine components Orders Placed This Encounter  Procedures  . Hemoglobin, fingerstick    Caryl Pina, MD [facility] 11/20/2016, 10:15 AM

## 2016-11-20 NOTE — H&P (Signed)
Patient Demographics:    Todd Berg, is a 66 y.o. male  MRN: [id number]   DOB - January 26, 1950  Admit Date - 11/20/2016  Outpatient Primary MD for the patient is Dettinger, [name], MD   Assessment & Plan:    Principal Problem:   GI bleed Active Problems:   COPD (chronic obstructive pulmonary disease) (HCC)   GERD (gastroesophageal reflux disease)   Hemorrhoids   Melena   Iron deficiency anemia due to chronic blood loss    1)Acute Gi Bleed/Melena-follow serial H&H, transfuse as clinically indicated, patient received bolus IV Protonix 80 mg x1,  continue with IV Protonix 40 mg twice daily as ordered, n.p.o. after midnight, GI consult for possible EGD on 11/21/2016.  On presentation to ED this morning was hemoglobin 11.2, repeat hemoglobin after hydration is 9.7 this afternoon,  recent hemoglobin was around 9 about 5 months ago, suspect some component of hemoconcentration due to dehydration and poor intake. Dr Paulita Fujita to see  2)Chronic Anemia- POA, secondary to apparently GI blood loss, H&H appears to be slightly above his recent baseline, transfuse as clinically indicated as above #1  3)H/o COPD-no acute flareup at this time, avoid steroids, use bronchodilators as prescribed  With History of - Reviewed by me  Past Medical History:  Diagnosis Date  . Cancer ([place]) 2015   rectal  . Chronic back pain   . COPD (chronic obstructive pulmonary disease) ([place])   . GERD (gastroesophageal reflux disease)   . Hematemesis   . Hemorrhoids   . Hypotension   . Melena       Past Surgical History:  Procedure Laterality Date  . BACK SURGERY    . HEMORRHOID SURGERY    . HERNIA REPAIR    . RECTAL SURGERY        Chief Complaint  Patient presents with  . GI Bleeding      HPI:    Todd Berg  is a 66 y.o.  male past medical history relevant for prior GI bleed, h/o GERD, hemorrhoids,,  gastric ulcers, esophagitis, remote ETOH abuse and COPD presents to ED for evaluation of black formed BMs x 6 days associated with mild diffuse lower abdominal discomfort and nausea.  No emesis.   Patient has progressively gotten weaker over the last few days especially in the last 24 hours, no syncope, he does have some dizziness.  No palpitations.   No fevers or chills, no chest pains  In the ED patient is found to have heme positive stools with overall stable hemodynamics,   On presentation to ED this morning was hemoglobin 11.2, repeat hemoglobin after hydration is 9.7 this afternoon,  recent hemoglobin was around 9 about 5 months ago    Review of systems:    In addition to the HPI above,   A full 12 point Review of 10 Systems was done, except as stated above, all other Review of 10 Systems were negative.  Social History:  Reviewed by me    Social History   Tobacco Use  . Smoking status: Former Smoker    Start date: 12/30/1964    Last attempt to quit: 06/19/2009    Years since quitting: 7.4  . Smokeless tobacco: Former Systems developer    Types: Snuff    Quit date: 09/26/2005  Substance Use Topics  . Alcohol use: No       Family History :  Reviewed by me    Family History  Problem Relation Age of Onset  . COPD Mother   . Stroke Father      Home Medications:   Prior to Admission medications   Medication Sig Start Date End Date Taking? Authorizing Provider  pantoprazole (PROTONIX) 40 MG tablet Take 1 tablet (40 mg total) by mouth 2 (two) times daily before a meal. 06/12/16  Yes Bonnielee Haff, MD  sodium chloride (OCEAN) 0.65 % SOLN nasal spray Place 2 sprays as needed into both nostrils for congestion.   Yes [provider]  tetrahydrozoline 0.05 % ophthalmic solution Place 1 drop daily as needed into both eyes (itching).   Yes [provider]     Allergies:    No Known  Allergies   Physical Exam:   Vitals  Blood pressure (!) 162/72, pulse (!) 57, temperature 97.9 F (36.6 C), temperature source Oral, resp. rate 13, height 6' (1.829 m), weight 92.1 kg (203 lb), SpO2 100 %.  Physical Examination: General appearance - alert, well appearing, and in no distress  Mental status - alert, oriented to person, place, and time,  Eyes - sclera anicteric Neck - supple, no JVD elevation , Chest - clear  to auscultation bilaterally, symmetrical air movement,  Heart - S1 and S2 normal,  Abdomen - soft, epigastric discomfort without rebound or guarding, nondistended, no CVA tenderness Neurological - screening mental status exam normal, neck supple without rigidity, cranial nerves II through XII intact, DTR's normal and symmetric Extremities - no pedal edema noted, intact peripheral pulses  Skin - warm, dry    Data Review:    CBC Recent Labs  Lab 11/20/16 1136 11/20/16 1414  WBC 6.8  --   HGB 10.4* 9.7*  HCT 35.3* 32.5*  PLT 244  --   MCV 78.4  --   MCH 23.1*  --   MCHC 29.5*  --   RDW 16.5*  --    ------------------------------------------------------------------------------------------------------------------  Chemistries  Recent Labs  Lab 11/20/16 1136  NA 136  K 3.9  CL 105  CO2 25  GLUCOSE 99  BUN 25*  CREATININE 1.05  CALCIUM 9.0  AST 16  ALT 14*  ALKPHOS 64  BILITOT 0.5   ------------------------------------------------------------------------------------------------------------------ estimated creatinine clearance is 74.9 mL/min (by C-G formula based on SCr of 1.05 mg/dL). ------------------------------------------------------------------------------------------------------------------ No results for input(s): TSH, T4TOTAL, T3FREE, THYROIDAB in the last 72 hours.  Invalid input(s): FREET3   Coagulation profile No results for input(s): INR, PROTIME in the last 168  hours. ------------------------------------------------------------------------------------------------------------------- No results for input(s): DDIMER in the last 72 hours. -------------------------------------------------------------------------------------------------------------------  Cardiac Enzymes No results for input(s): CKMB, TROPONINI, MYOGLOBIN in the last 168 hours.  Invalid input(s): CK ------------------------------------------------------------------------------------------------------------------ No results found for: BNP   ---------------------------------------------------------------------------------------------------------------  Urinalysis    Component Value Date/Time   COLORURINE YELLOW 02/26/2007 1901   APPEARANCEUR CLEAR 02/26/2007 1901   LABSPEC 1.003 (L) 02/26/2007 1901   PHURINE 6.5 02/26/2007 1901   GLUCOSEU NEGATIVE 02/26/2007 1901   HGBUR NEGATIVE 02/26/2007 1901   BILIRUBINUR  NEGATIVE 02/26/2007 1901   KETONESUR NEGATIVE 02/26/2007 1901   PROTEINUR NEGATIVE 02/26/2007 1901   UROBILINOGEN 0.2 02/26/2007 1901   NITRITE NEGATIVE 02/26/2007 1901   LEUKOCYTESUR  02/26/2007 1901    NEGATIVE MICROSCOPIC NOT DONE ON URINES WITH NEGATIVE PROTEIN, BLOOD, LEUKOCYTES, NITRITE, OR GLUCOSE <1000 mg/dL.    ----------------------------------------------------------------------------------------------------------------   Imaging Results:    Ct Abdomen Pelvis W Contrast  Result Date: 11/20/2016 CLINICAL DATA:  Extreme weakness. Black stools. History of rectal cancer. EXAM: CT ABDOMEN AND PELVIS WITH CONTRAST TECHNIQUE: Multidetector CT imaging of the abdomen and pelvis was performed using the standard protocol following bolus administration of intravenous contrast. CONTRAST:  121mL ISOVUE-300 IOPAMIDOL (ISOVUE-300) INJECTION 61% COMPARISON:  None. FINDINGS: Lower chest: Small hiatal hernia. Hepatobiliary: 8 mm too small to be actually characterized  circumscribed hypoattenuated lesion in the caudate lobe of the liver. The gallbladder is decompressed. There is a 4 mm polyp versus calculus adherent to the counter dependent wall of the gallbladder. Pancreas: Unremarkable. No pancreatic ductal dilatation or surrounding inflammatory changes. Spleen: Normal in size without focal abnormality. Adrenals/Urinary Tract: Adrenal glands are unremarkable. Kidneys are normal, without renal calculi, focal lesion, or hydronephrosis. Bladder is unremarkable. Incidental note of benign-appearing left renal cyst. Stomach/Bowel: Stomach is within normal limits. Appendix appears normal. No evidence of bowel wall thickening, distention, or inflammatory changes. Scattered colonic diverticulosis. Vascular/Lymphatic: No significant vascular findings are present. No enlarged abdominal or pelvic lymph nodes. Reproductive: Enlarged heterogeneous prostate 4.9 cm. Other: No abdominal wall hernia or abnormality. No abdominopelvic ascites. Musculoskeletal: L5-S1 fusion. IMPRESSION: Small hiatal hernia. No apparent by CT colonic or small bowel masses. 8 mm too small to be actually characterized nodule in the liver. 4 mm gallbladder calculus versus polyp. Electronically Signed   By: Fidela Salisbury M.D.   On: 11/20/2016 13:53    Radiological Exams on Admission: Ct Abdomen Pelvis W Contrast  Result Date: 11/20/2016 CLINICAL DATA:  Extreme weakness. Black stools. History of rectal cancer. EXAM: CT ABDOMEN AND PELVIS WITH CONTRAST TECHNIQUE: Multidetector CT imaging of the abdomen and pelvis was performed using the standard protocol following bolus administration of intravenous contrast. CONTRAST:  162mL ISOVUE-300 IOPAMIDOL (ISOVUE-300) INJECTION 61% COMPARISON:  None. FINDINGS: Lower chest: Small hiatal hernia. Hepatobiliary: 8 mm too small to be actually characterized circumscribed hypoattenuated lesion in the caudate lobe of the liver. The gallbladder is decompressed. There is a 4 mm  polyp versus calculus adherent to the counter dependent wall of the gallbladder. Pancreas: Unremarkable. No pancreatic ductal dilatation or surrounding inflammatory changes. Spleen: Normal in size without focal abnormality. Adrenals/Urinary Tract: Adrenal glands are unremarkable. Kidneys are normal, without renal calculi, focal lesion, or hydronephrosis. Bladder is unremarkable. Incidental note of benign-appearing left renal cyst. Stomach/Bowel: Stomach is within normal limits. Appendix appears normal. No evidence of bowel wall thickening, distention, or inflammatory changes. Scattered colonic diverticulosis. Vascular/Lymphatic: No significant vascular findings are present. No enlarged abdominal or pelvic lymph nodes. Reproductive: Enlarged heterogeneous prostate 4.9 cm. Other: No abdominal wall hernia or abnormality. No abdominopelvic ascites. Musculoskeletal: L5-S1 fusion. IMPRESSION: Small hiatal hernia. No apparent by CT colonic or small bowel masses. 8 mm too small to be actually characterized nodule in the liver. 4 mm gallbladder calculus versus polyp. Electronically Signed   By: Fidela Salisbury M.D.   On: 11/20/2016 13:53    DVT Prophylaxis -SCD  AM Labs Ordered, also please review Full Orders  Family Communication: Admission, patients condition and plan of care including tests being ordered have been discussed  with the patient who indicate understanding and agree with the plan   Code Status - Full Code  Likely DC to  Home   Condition   Stable  Jesly Hartmann M.D on 11/20/2016 at 5:21 PM   Between 7am to 7pm - Pager - [phone] After 7pm go to [email or web address] - password [email or web address]  [facility] - Office  [phone]  Voice Recognition Viviann Spare dictation system was used to create this note, attempts have been made to correct errors. Please contact the author with questions and/or clarifications.

## 2016-11-21 ENCOUNTER — Inpatient Hospital Stay (HOSPITAL_COMMUNITY): Payer: Medicare Other | Admitting: Certified Registered Nurse Anesthetist

## 2016-11-21 ENCOUNTER — Encounter (HOSPITAL_COMMUNITY): Payer: Self-pay

## 2016-11-21 ENCOUNTER — Encounter (HOSPITAL_COMMUNITY)
Admission: EM | Disposition: A | Discharge status: Home / Self Care | Payer: Self-pay | Source: Home / Self Care | Attending: Emergency Medicine

## 2016-11-21 DIAGNOSIS — K921 Melena: Secondary | ICD-10-CM | POA: Diagnosis not present

## 2016-11-21 DIAGNOSIS — K2970 Gastritis, unspecified, without bleeding: Secondary | ICD-10-CM | POA: Diagnosis not present

## 2016-11-21 DIAGNOSIS — D500 Iron deficiency anemia secondary to blood loss (chronic): Secondary | ICD-10-CM

## 2016-11-21 DIAGNOSIS — R1013 Epigastric pain: Secondary | ICD-10-CM | POA: Diagnosis not present

## 2016-11-21 DIAGNOSIS — K222 Esophageal obstruction: Secondary | ICD-10-CM | POA: Diagnosis not present

## 2016-11-21 DIAGNOSIS — K449 Diaphragmatic hernia without obstruction or gangrene: Secondary | ICD-10-CM | POA: Diagnosis not present

## 2016-11-21 DIAGNOSIS — K210 Gastro-esophageal reflux disease with esophagitis: Secondary | ICD-10-CM

## 2016-11-21 DIAGNOSIS — K259 Gastric ulcer, unspecified as acute or chronic, without hemorrhage or perforation: Secondary | ICD-10-CM | POA: Diagnosis not present

## 2016-11-21 DIAGNOSIS — D62 Acute posthemorrhagic anemia: Secondary | ICD-10-CM | POA: Diagnosis not present

## 2016-11-21 DIAGNOSIS — R109 Unspecified abdominal pain: Secondary | ICD-10-CM | POA: Diagnosis not present

## 2016-11-21 DIAGNOSIS — J410 Simple chronic bronchitis: Secondary | ICD-10-CM

## 2016-11-21 DIAGNOSIS — D649 Anemia, unspecified: Secondary | ICD-10-CM | POA: Diagnosis not present

## 2016-11-21 HISTORY — PX: ESOPHAGOGASTRODUODENOSCOPY (EGD) WITH PROPOFOL: SHX5813

## 2016-11-21 LAB — BASIC METABOLIC PANEL
ANION GAP: 5 (ref 5–15)
BUN: 13 mg/dL (ref 6–20)
CALCIUM: 8.6 mg/dL — AB (ref 8.9–10.3)
CO2: 25 mmol/L (ref 22–32)
Chloride: 109 mmol/L (ref 101–111)
Creatinine, Ser: 0.9 mg/dL (ref 0.61–1.24)
GLUCOSE: 92 mg/dL (ref 65–99)
POTASSIUM: 3.5 mmol/L (ref 3.5–5.1)
SODIUM: 139 mmol/L (ref 135–145)

## 2016-11-21 LAB — CBC
HCT: 33.1 % — ABNORMAL LOW (ref 39.0–52.0)
HEMOGLOBIN: 9.8 g/dL — AB (ref 13.0–17.0)
MCH: 23.1 pg — ABNORMAL LOW (ref 26.0–34.0)
MCHC: 29.6 g/dL — AB (ref 30.0–36.0)
MCV: 77.9 fL — ABNORMAL LOW (ref 78.0–100.0)
Platelets: 198 10*3/uL (ref 150–400)
RBC: 4.25 MIL/uL (ref 4.22–5.81)
RDW: 16.3 % — ABNORMAL HIGH (ref 11.5–15.5)
WBC: 5.4 10*3/uL (ref 4.0–10.5)

## 2016-11-21 SURGERY — ESOPHAGOGASTRODUODENOSCOPY (EGD) WITH PROPOFOL
Anesthesia: Monitor Anesthesia Care | Laterality: Left

## 2016-11-21 MED ORDER — PROPOFOL 500 MG/50ML IV EMUL
INTRAVENOUS | Status: DC | PRN
  Administered 2016-11-21: 100 ug/kg/min via INTRAVENOUS

## 2016-11-21 MED ORDER — LIDOCAINE HCL (CARDIAC) 20 MG/ML IV SOLN
INTRAVENOUS | Status: DC | PRN
  Administered 2016-11-21: 60 mg via INTRAVENOUS

## 2016-11-21 MED ORDER — SODIUM CHLORIDE 0.9 % IV SOLN
INTRAVENOUS | Status: DC
  Administered 2016-11-21: 11:00:00 via INTRAVENOUS

## 2016-11-21 MED ORDER — PROPOFOL 10 MG/ML IV BOLUS
INTRAVENOUS | Status: DC | PRN
  Administered 2016-11-21 (×2): 10 mg via INTRAVENOUS

## 2016-11-21 MED ORDER — BUTAMBEN-TETRACAINE-BENZOCAINE 2-2-14 % EX AERO
INHALATION_SPRAY | CUTANEOUS | Status: DC | PRN
  Administered 2016-11-21: 2 via TOPICAL

## 2016-11-21 MED ORDER — PANTOPRAZOLE SODIUM 40 MG PO TBEC: 40.0000 mg | DELAYED_RELEASE_TABLET | Freq: Every day | ORAL | 3 refills | Status: DC

## 2016-11-21 SURGICAL SUPPLY — 15 items

## 2016-11-21 NOTE — Care Management Obs Status (Deleted)
[place] NOTIFICATION   Patient Details  Name: HILLIS MCPHATTER MRN: [id number] Date of Birth: March 29, 1950   Medicare Observation Status Notification Given:       Delrae Sawyers, RN 11/21/2016, 1:26 PM

## 2016-11-21 NOTE — Op Note (Signed)
[facility] Patient Name: Todd Berg Procedure Date : 11/21/2016 MRN: [id number] Attending MD: Arta Silence , MD Date of Birth: [birth year] CSN: [id number] Age: 66 Admit Type: Inpatient Procedure:                Upper GI endoscopy Indications:              Epigastric abdominal pain, Acute post hemorrhagic                            anemia, Melena Providers:                Arta Silence, MD, Zenon Mayo, RN, Cletis Athens,                            Technician Referring MD:              Medicines:                Monitored Anesthesia Care Complications:            No immediate complications. Estimated Blood Loss:     Estimated blood loss: none. Procedure:                Pre-Anesthesia Assessment:                           - Prior to the procedure, a History and Physical                            was performed, and patient medications and                            allergies were reviewed. The patient's tolerance of                            previous anesthesia was also reviewed. The risks                            and benefits of the procedure and the sedation                            options and risks were discussed with the patient.                            All questions were answered, and informed consent                            was obtained. Prior Anticoagulants: The patient has                            taken no previous anticoagulant or antiplatelet                            agents. ASA Grade Assessment: III - A patient with                            severe  systemic disease. After reviewing the risks                            and benefits, the patient was deemed in                            satisfactory condition to undergo the procedure.                           After obtaining informed consent, the endoscope was                            passed under direct vision. Throughout the                            procedure, the patient's blood  pressure, pulse, and                            oxygen saturations were monitored continuously. The                            [id number] ([id number]) scope was introduced through the                            mouth, and advanced to the second part of duodenum.                            The upper GI endoscopy was accomplished without                            difficulty. The patient tolerated the procedure                            well. Scope In: Scope Out: Findings:      A small hiatal hernia was present.      One mild benign-appearing, intrinsic stenosis was found. Consistent with       widely patent Schatzki's ring; no dilatation done, as patient is not       having dysphagia symptoms.      The exam of the esophagus was otherwise normal.      One non-bleeding cratered gastric ulcer with no stigmata of bleeding       (clean based; no active bleeding, no visible vessel, no adherent clot)       was found in the prepyloric region of the stomach. The lesion was 6 mm       in largest dimension.      Mild inflammation was found in the prepyloric region of the stomach.      The exam of the stomach was otherwise normal.      The duodenal bulb, first portion of the duodenum and second portion of       the duodenum were normal.      No old or fresh blood was seen to the extent of our examination. Impression:               - Small hiatal hernia.                           -  Benign-appearing esophageal stenosis.                           - Non-bleeding gastric ulcer with no stigmata of                            bleeding.                           - Gastritis.                           - Normal duodenal bulb, first portion of the                            duodenum and second portion of the duodenum.                           - Suspect prior bleeding and anemia was from                            gastric ulcer, which in turn is likely                            NSAID-mediated. Moderate  Sedation:      None Recommendation:           - Return patient to hospital ward for ongoing care.                           - Soft diet today.                           - Continue present medications; can transition to                            oral pantoprazole 40 mg po qd and continue on this                            indefinitely.                           - Avoid ASA/NSAIDs indefinitely as clinically                            feasible.                           - Based on clean-based appearance of ulcer, patient                            ok to be discharged home today from GI perspective,                            with close outpatient follow-up.                           -  [facility] will sign-off; happy to see as                            outpatient in follow-up (patient would likely                            benefit from repeat endoscopy in 3 months to assess                            for ulcer healing); please call with any questions;                            thank you for the consultation. Procedure Code(s):        --- Professional ---                           [phone], Esophagogastroduodenoscopy, flexible,                            transoral; diagnostic, including collection of                            specimen(s) by brushing or washing, when performed                            (separate procedure) Diagnosis Code(s):        --- Professional ---                           K44.9, Diaphragmatic hernia without obstruction or                            gangrene                           K22.2, Esophageal obstruction                           K25.9, Gastric ulcer, unspecified as acute or                            chronic, without hemorrhage or perforation                           K29.70, Gastritis, unspecified, without bleeding                           R10.13, Epigastric pain                           D62, Acute posthemorrhagic anemia                           K92.1,  Melena (includes Hematochezia) CPT copyright 2016 [employer]. All rights reserved. The codes documented in this report are preliminary and upon coder review may  be revised to meet current compliance requirements. Arta Silence, MD  11/21/2016 11:09:19 AM This report has been signed electronically. Number of Addenda: 0

## 2016-11-21 NOTE — Discharge Summary (Signed)
Physician Discharge Summary  Todd Berg OIN:[id number] DOB: 15-Jan-1950 DOA: 11/20/2016  PCP: Dettinger, [name], MD  Admit date: 11/20/2016 Discharge date: 11/21/2016  Admitted From: Home  Disposition: Home   Recommendations for Outpatient Follow-up:  1. Follow up with PCP in 1-2 weeks 2. Please obtain BMP/CBC in one week 3. Please follow up on the following pending results:  Home Health: No Equipment/Devices: No  Discharge Condition: Stable CODE STATUS: Full  Diet recommendation: Soft diet  Brief/Interim Summary: Todd Berg is a 66 y.o. male whom we've been asked to see for above reasons.  One week history of intermittent melena and abdominal pain.  No hematemesis.  Pain-free now; no bowel movements or blood per rectum since admission.  Had endoscopy May 2018 by Dr. Michail Sermon showing gastric ulcer with visible vessel requiring endoscopic therapy.  H. Pylori seronegative at last admission; patient does admit using BC powder's from time-to-time.  Patient underwent EGD on 11/21/2016 with Dr. Paulita Fujita of [facility] gastroenterology.  Found to have clean based gastric ulcer.  Patient was stable to be discharged on pantoprazole 40 mg p.o. daily a soft diet.  Will need close outpatient follow-up.  Hemoglobin and hematocrit were stable on day of discharge.  Discharge Diagnoses:  Principal Problem:   GI bleed Active Problems:   COPD (chronic obstructive pulmonary disease) (HCC)   GERD (gastroesophageal reflux disease)   Hemorrhoids   Melena   Iron deficiency anemia due to chronic blood loss    Discharge Instructions  Discharge Instructions    Call MD for:  difficulty breathing, headache or visual disturbances   Complete by:  As directed    Call MD for:  extreme fatigue   Complete by:  As directed    Call MD for:  hives   Complete by:  As directed    Call MD for:  persistant dizziness or light-headedness   Complete by:  As directed    Call MD for:  persistant nausea and  vomiting   Complete by:  As directed    Call MD for:  severe uncontrolled pain   Complete by:  As directed    Call MD for:  temperature >100.4   Complete by:  As directed    Discharge instructions   Complete by:  As directed    Take protonix 2x/day GI follow up will be set up for you Soft diet   Increase activity slowly   Complete by:  As directed      Allergies as of 11/21/2016   No Known Allergies     Medication List    TAKE these medications   pantoprazole 40 MG tablet Commonly known as:  PROTONIX Take 1 tablet (40 mg total) daily by mouth. What changed:  when to take this   sodium chloride 0.65 % Soln nasal spray Commonly known as:  OCEAN Place 2 sprays as needed into both nostrils for congestion.   tetrahydrozoline 0.05 % ophthalmic solution Place 1 drop daily as needed into both eyes (itching).      Follow-up Information    Dettinger, [name], MD. Schedule an appointment as soon as possible for a visit in 1 week(s).   Specialties:  Family Medicine, Cardiology Contact information: [place] [address] [phone]        Arta Silence, MD. Schedule an appointment as soon as possible for a visit in 1 week(s).   Specialty:  Gastroenterology Contact information: [address] [place] [address] [phone]  No Known Allergies  Consultations:  Gastroenterology    Procedures/Studies: Ct Abdomen Pelvis W Contrast  Result Date: 11/20/2016 CLINICAL DATA:  Extreme weakness. Black stools. History of rectal cancer. EXAM: CT ABDOMEN AND PELVIS WITH CONTRAST TECHNIQUE: Multidetector CT imaging of the abdomen and pelvis was performed using the standard protocol following bolus administration of intravenous contrast. CONTRAST:  167mL ISOVUE-300 IOPAMIDOL (ISOVUE-300) INJECTION 61% COMPARISON:  None. FINDINGS: Lower chest: Small hiatal hernia. Hepatobiliary: 8 mm too small to be actually characterized circumscribed  hypoattenuated lesion in the caudate lobe of the liver. The gallbladder is decompressed. There is a 4 mm polyp versus calculus adherent to the counter dependent wall of the gallbladder. Pancreas: Unremarkable. No pancreatic ductal dilatation or surrounding inflammatory changes. Spleen: Normal in size without focal abnormality. Adrenals/Urinary Tract: Adrenal glands are unremarkable. Kidneys are normal, without renal calculi, focal lesion, or hydronephrosis. Bladder is unremarkable. Incidental note of benign-appearing left renal cyst. Stomach/Bowel: Stomach is within normal limits. Appendix appears normal. No evidence of bowel wall thickening, distention, or inflammatory changes. Scattered colonic diverticulosis. Vascular/Lymphatic: No significant vascular findings are present. No enlarged abdominal or pelvic lymph nodes. Reproductive: Enlarged heterogeneous prostate 4.9 cm. Other: No abdominal wall hernia or abnormality. No abdominopelvic ascites. Musculoskeletal: L5-S1 fusion. IMPRESSION: Small hiatal hernia. No apparent by CT colonic or small bowel masses. 8 mm too small to be actually characterized nodule in the liver. 4 mm gallbladder calculus versus polyp. Electronically Signed   By: Fidela Salisbury M.D.   On: 11/20/2016 13:53   Please see EGD report by Dr. Paulita Fujita   Subjective: Patient feeling well after procedure.  Hopeful to go home today.  Denies any bleeding per rectum.  Understands he will need to continue Protonix outpatient.  Understand outpatient follow-up with both primary care physician and gastroenterologist.  Denies any pain, shortness of breath, dizziness or weakness.  Discharge Exam: Vitals:   11/21/16 1116 11/21/16 1120  BP: 133/81 133/81  Pulse: (!) 56 (!) 54  Resp: 14 18  Temp:  (!) 97.4 F (36.3 C)  SpO2: 98% 97%   Vitals:   11/21/16 1109 11/21/16 1115 11/21/16 1116 11/21/16 1120  BP: 134/71 133/81 133/81 133/81  Pulse: (!) 56 (!) 57 (!) 56 (!) 54  Resp: 17 16 14 18    Temp: (!) 97.3 F (36.3 C)   (!) 97.4 F (36.3 C)  TempSrc:      SpO2: 98% 98% 98% 97%  Weight:      Height:        General: Pt is alert, awake, not in acute distress Cardiovascular: RRR, S1/S2 +, no rubs, no gallops Respiratory: CTA bilaterally, no wheezing, no rhonchi Abdominal: Soft, NT, ND, bowel sounds + Extremities: no edema, no cyanosis    The results of significant diagnostics from this hospitalization (including imaging, microbiology, ancillary and laboratory) are listed below for reference.     Microbiology: No results found for this or any previous visit (from the past 240 hour(s)).   Labs: BNP (last 3 results) No results for input(s): BNP in the last 8760 hours. Basic Metabolic Panel: Recent Labs  Lab 11/20/16 1136 11/21/16 0440  NA 136 139  K 3.9 3.5  CL 105 109  CO2 25 25  GLUCOSE 99 92  BUN 25* 13  CREATININE 1.05 0.90  CALCIUM 9.0 8.6*   Liver Function Tests: Recent Labs  Lab 11/20/16 1136  AST 16  ALT 14*  ALKPHOS 64  BILITOT 0.5  PROT 6.7  ALBUMIN 3.6  No results for input(s): LIPASE, AMYLASE in the last 168 hours. No results for input(s): AMMONIA in the last 168 hours. CBC: Recent Labs  Lab 11/20/16 1136 11/20/16 1414 11/20/16 1923 11/21/16 0440  WBC 6.8  --  5.9 5.4  HGB 10.4* 9.7* 10.1* 9.8*  HCT 35.3* 32.5* 34.2* 33.1*  MCV 78.4  --  78.3 77.9*  PLT 244  --  215 198   Cardiac Enzymes: No results for input(s): CKTOTAL, CKMB, CKMBINDEX, TROPONINI in the last 168 hours. BNP: Invalid input(s): POCBNP CBG: No results for input(s): GLUCAP in the last 168 hours. D-Dimer No results for input(s): DDIMER in the last 72 hours. Hgb A1c No results for input(s): HGBA1C in the last 72 hours. Lipid Profile No results for input(s): CHOL, HDL, LDLCALC, TRIG, CHOLHDL, LDLDIRECT in the last 72 hours. Thyroid function studies No results for input(s): TSH, T4TOTAL, T3FREE, THYROIDAB in the last 72 hours.  Invalid input(s):  FREET3 Anemia work up No results for input(s): VITAMINB12, FOLATE, FERRITIN, TIBC, IRON, RETICCTPCT in the last 72 hours. Urinalysis    Component Value Date/Time   COLORURINE YELLOW 02/26/2007 1901   APPEARANCEUR CLEAR 02/26/2007 1901   LABSPEC 1.003 (L) 02/26/2007 1901   PHURINE 6.5 02/26/2007 1901   GLUCOSEU NEGATIVE 02/26/2007 1901   HGBUR NEGATIVE 02/26/2007 1901   BILIRUBINUR NEGATIVE 02/26/2007 1901   KETONESUR NEGATIVE 02/26/2007 1901   PROTEINUR NEGATIVE 02/26/2007 1901   UROBILINOGEN 0.2 02/26/2007 1901   NITRITE NEGATIVE 02/26/2007 1901   LEUKOCYTESUR  02/26/2007 1901    NEGATIVE MICROSCOPIC NOT DONE ON URINES WITH NEGATIVE PROTEIN, BLOOD, LEUKOCYTES, NITRITE, OR GLUCOSE <1000 mg/dL.   Sepsis Labs Invalid input(s): PROCALCITONIN,  WBC,  LACTICIDVEN Microbiology No results found for this or any previous visit (from the past 240 hour(s)).   Time coordinating discharge: Over 30 minutes  SIGNED:   Loretha Stapler, MD  [facility] 11/21/2016, 12:35 PM Pager [phone] If 7PM-7AM, please contact night-coverage [email or web address] Password [email or web address]

## 2016-11-21 NOTE — Interval H&P Note (Signed)
History and Physical Interval Note:  11/21/2016 10:33 AM  Todd Berg  has presented today for surgery, with the diagnosis of melena, anemia  The various methods of treatment have been discussed with the patient and family. After consideration of risks, benefits and other options for treatment, the patient has consented to  Procedure(s): ESOPHAGOGASTRODUODENOSCOPY (EGD) WITH PROPOFOL (Left) as a surgical intervention .  The patient's history has been reviewed, patient examined, no change in status, stable for surgery.  I have reviewed the patient's chart and labs.  Questions were answered to the patient's satisfaction.     Landry Dyke

## 2016-11-21 NOTE — Anesthesia Preprocedure Evaluation (Addendum)
Anesthesia Evaluation  Patient identified by MRN, date of birth, ID band Patient awake    Reviewed: Allergy & Precautions, NPO status , Patient's Chart, lab work & pertinent test results  Airway Mallampati: II  TM Distance: >3 FB Neck ROM: Full    Dental  (+) Poor Dentition, Dental Advisory Given, Missing, Chipped   Pulmonary COPD, former smoker,    breath sounds clear to auscultation       Cardiovascular Exercise Tolerance: Good (-) Past MI negative cardio ROS   Rhythm:Regular Rate:Normal     Neuro/Psych negative neurological ROS     GI/Hepatic Neg liver ROS, GERD  Medicated and Controlled,GI bleed, hx rectal cancer   Endo/Other  negative endocrine ROS  Renal/GU negative Renal ROS     Musculoskeletal   Abdominal   Peds  Hematology  (+) anemia ,   Anesthesia Other Findings Chronic back pain  Reproductive/Obstetrics                            Lab Results  Component Value Date   WBC 5.4 11/21/2016   HGB 9.8 (L) 11/21/2016   HCT 33.1 (L) 11/21/2016   MCV 77.9 (L) 11/21/2016   PLT 198 11/21/2016   Lab Results  Component Value Date   CREATININE 0.90 11/21/2016   BUN 13 11/21/2016   NA 139 11/21/2016   K 3.5 11/21/2016   CL 109 11/21/2016   CO2 25 11/21/2016    Anesthesia Physical  Anesthesia Plan  ASA: III  Anesthesia Plan: MAC   Post-op Pain Management:    Induction: Intravenous  PONV Risk Score and Plan: Treatment may vary due to age or medical condition and Propofol infusion  Airway Management Planned: Natural Airway and Nasal Cannula  Additional Equipment: None  Intra-op Plan:   Post-operative Plan:   Informed Consent: I have reviewed the patients History and Physical, chart, labs and discussed the procedure including the risks, benefits and alternatives for the proposed anesthesia with the patient or authorized representative who has indicated his/her  understanding and acceptance.   Dental advisory given  Plan Discussed with: CRNA  Anesthesia Plan Comments:        Anesthesia Quick Evaluation

## 2016-11-21 NOTE — Progress Notes (Signed)
Todd Berg to be D/C'd Home per MD order.  Discussed with the patient and all questions fully answered.  VSS, Skin clean, dry and intact without evidence of skin break down, no evidence of skin tears noted. IV catheter discontinued intact. Site without signs and symptoms of complications. Dressing and pressure applied.  An After Visit Summary was printed and given to the patient. Patient received prescription.  Allergies as of 11/21/2016   No Known Allergies     Medication List    TAKE these medications   pantoprazole 40 MG tablet Commonly known as:  PROTONIX Take 1 tablet (40 mg total) daily by mouth. What changed:  when to take this   sodium chloride 0.65 % Soln nasal spray Commonly known as:  OCEAN Place 2 sprays as needed into both nostrils for congestion.   tetrahydrozoline 0.05 % ophthalmic solution Place 1 drop daily as needed into both eyes (itching).      D/c education completed with patient/family including follow up instructions, medication list, d/c activities limitations if indicated, with other d/c instructions as indicated by MD - patient able to verbalize understanding, all questions fully answered.   Patient instructed to return to ED, call 911, or call MD for any changes in condition.   Patient escorted via [place], and D/C home via private auto.  Dreama Saa 11/21/2016 2:31 PM

## 2016-11-21 NOTE — Care Management CC44 (Deleted)
Condition Code 44 Documentation Completed  Patient Details  Name: Todd Berg MRN: [id number] Date of Birth: October 15, 1950   Condition Code 44 given:    Patient signature on Condition Code 44 notice:    Documentation of 2 MD's agreement:    Code 44 added to claim:       Delrae Sawyers, RN 11/21/2016, 1:25 PM

## 2016-11-21 NOTE — Care Management Obs Status (Deleted)
[place] NOTIFICATION   Patient Details  Name: Todd Berg MRN: [id number] Date of Birth: 1950/04/27   Medicare Observation Status Notification Given:  Yes    CrutchfieldAntony Haste, RN 11/21/2016, 1:28 PM

## 2016-11-21 NOTE — Anesthesia Postprocedure Evaluation (Signed)
Anesthesia Post Note  Patient: Todd Berg  Procedure(s) Performed: ESOPHAGOGASTRODUODENOSCOPY (EGD) WITH PROPOFOL (Left )     Patient location during evaluation: PACU Anesthesia Type: MAC Level of consciousness: awake and alert Pain management: pain level controlled Vital Signs Assessment: post-procedure vital signs reviewed and stable Respiratory status: spontaneous breathing, nonlabored ventilation and respiratory function stable Cardiovascular status: stable and blood pressure returned to baseline Anesthetic complications: no    Last Vitals:  Vitals:   11/21/16 1116 11/21/16 1120  BP: 133/81 133/81  Pulse: (!) 56 (!) 54  Resp: 14 18  Temp:  (!) 36.3 C  SpO2: 98% 97%    Last Pain:  Vitals:   11/21/16 1120  TempSrc:   PainSc: 0-No pain                 Audry Pili

## 2016-11-21 NOTE — Anesthesia Procedure Notes (Signed)
Procedure Name: MAC Date/Time: 11/21/2016 10:40 AM Performed by: Oletta Lamas, CRNA Pre-anesthesia Checklist: Patient identified, Emergency Drugs available, Suction available and Patient being monitored Patient Re-evaluated:Patient Re-evaluated prior to induction Oxygen Delivery Method: Nasal cannula

## 2016-11-21 NOTE — Consult Note (Signed)
[facility] Gastroenterology Consultation Note  Referring Provider: Dr. Ilene Qua Primary Care Physician:  Dettinger, [name], MD  Reason for Consultation:  Melena, anemia  HPI: KETIH GOODIE is a 66 y.o. male whom we've been asked to see for above reasons.  One week history of intermittent melena and abdominal pain.  No hematemesis.  Pain-free now; no bowel movements or blood per rectum since admission.  Had endoscopy May 2018 by Dr. Michail Sermon showing gastric ulcer with visible vessel requiring endoscopic therapy.  H. Pylori seronegative at last admission; patient does admit using BC powder's from time-to-time.   Past Medical History:  Diagnosis Date  . Cancer ([place]) 2015   rectal  . Chronic back pain   . COPD (chronic obstructive pulmonary disease) ([place])   . GERD (gastroesophageal reflux disease)   . Hematemesis   . Hemorrhoids   . Hypotension   . Melena     Past Surgical History:  Procedure Laterality Date  . BACK SURGERY    . HEMORRHOID SURGERY    . HERNIA REPAIR    . RECTAL SURGERY      Prior to Admission medications   Medication Sig Start Date End Date Taking? Authorizing Provider  pantoprazole (PROTONIX) 40 MG tablet Take 1 tablet (40 mg total) by mouth 2 (two) times daily before a meal. 06/12/16  Yes Bonnielee Haff, MD  sodium chloride (OCEAN) 0.65 % SOLN nasal spray Place 2 sprays as needed into both nostrils for congestion.   Yes [provider]  tetrahydrozoline 0.05 % ophthalmic solution Place 1 drop daily as needed into both eyes (itching).   Yes [provider]    Current Facility-Administered Medications  Medication Dose Route Frequency Provider Last Rate Last Dose  . 0.9 %  sodium chloride infusion   Intravenous Continuous Arta Silence, MD      . Doug Sou Hold] 0.9 %  sodium chloride infusion  250 mL Intravenous PRN Emokpae, Courage, MD      . 0.9 %  sodium chloride infusion   Intravenous Continuous Denton Brick, Courage, MD 50 mL/hr at  11/20/16 1810    . [MAR Hold] acetaminophen (TYLENOL) tablet 650 mg  650 mg Oral Q6H PRN Roxan Hockey, MD       Or  . Doug Sou Hold] acetaminophen (TYLENOL) suppository 650 mg  650 mg Rectal Q6H PRN Emokpae, Courage, MD      . Doug Sou Hold] albuterol (PROVENTIL) (2.5 MG/3ML) 0.083% nebulizer solution 2.5 mg  2.5 mg Nebulization Q2H PRN Emokpae, Courage, MD      . Doug Sou Hold] ondansetron (ZOFRAN) tablet 4 mg  4 mg Oral Q6H PRN Emokpae, Courage, MD       Or  . Doug Sou Hold] ondansetron (ZOFRAN) injection 4 mg  4 mg Intravenous Q6H PRN Emokpae, Courage, MD      . Doug Sou Hold] pantoprazole (PROTONIX) injection 40 mg  40 mg Intravenous Q12H Emokpae, Courage, MD   40 mg at 11/20/16 2302  . [MAR Hold] polyethylene glycol (MIRALAX / GLYCOLAX) packet 17 g  17 g Oral Daily PRN Roxan Hockey, MD      . Doug Sou Hold] senna (SENOKOT) tablet 8.6 mg  1 tablet Oral BID Denton Brick, Courage, MD   8.6 mg at 11/20/16 2302  . [MAR Hold] sodium chloride flush (NS) 0.9 % injection 3 mL  3 mL Intravenous Q12H Emokpae, Courage, MD   3 mL at 11/20/16 2303  . [MAR Hold] sodium chloride flush (NS) 0.9 % injection 3 mL  3 mL Intravenous PRN  Roxan Hockey, MD      . Doug Sou Hold] traZODone (DESYREL) tablet 50 mg  50 mg Oral QHS PRN Roxan Hockey, MD        Allergies as of 11/20/2016  . (No Known Allergies)    Family History  Problem Relation Age of Onset  . COPD Mother   . Stroke Father     Social History   Socioeconomic History  . Marital status: Married    Spouse name: Not on file  . Number of children: Not on file  . Years of education: Not on file  . Highest education level: Not on file  Social Needs  . Financial resource strain: Not on file  . Food insecurity - worry: Not on file  . Food insecurity - inability: Not on file  . Transportation needs - medical: Not on file  . Transportation needs - non-medical: Not on file  Occupational History  . Not on file  Tobacco Use  . Smoking status: Former Smoker     Start date: 12/30/1964    Last attempt to quit: 06/19/2009    Years since quitting: 7.4  . Smokeless tobacco: Former Systems developer    Types: Snuff    Quit date: 09/26/2005  Substance and Sexual Activity  . Alcohol use: No  . Drug use: No  . Sexual activity: Not on file  Other Topics Concern  . Not on file  Social History Narrative  . Not on file    Review of Systems: As per HPI, all others negative  Physical Exam: Vital signs in last 24 hours: Temp:  [97.6 F (36.4 C)-97.9 F (36.6 C)] 97.6 F (36.4 C) (11/11 1019) Pulse Rate:  [54-64] 59 (11/11 1019) Resp:  [11-23] 23 (11/11 1019) BP: (138-163)/(57-86) 155/77 (11/11 1019) SpO2:  [97 %-100 %] 99 % (11/11 1019) Weight:  [203 lb (92.1 kg)] 203 lb (92.1 kg) (11/11 1024) Last BM Date: (PTA) General:   Alert, older-appearing than stated age, Well-developed, well-nourished, pleasant and cooperative in NAD Head:  Normocephalic and atraumatic. Eyes:  Sclera clear, no icterus.   Conjunctiva a bit pale Ears:  Normal auditory acuity. Nose:  No deformity, discharge,  or lesions. Mouth:  No deformity or lesions.  Oropharynx pink & dry Neck:  Supple; no masses or thyromegaly. Lungs:  Clear throughout to auscultation.   No wheezes, crackles, or rhonchi. No acute distress. Heart:  Regular rate and rhythm; no murmurs, clicks, rubs,  or gallops. Abdomen:  Soft, nontender and nondistended. No masses, hepatosplenomegaly or hernias noted. Normal bowel sounds, without guarding, and without rebound.     Msk:  Symmetrical without gross deformities. Normal posture. Pulses:  Normal pulses noted. Extremities:  Without clubbing or edema. Neurologic:  Alert and  oriented x4;  grossly normal neurologically. Skin:  Several tattoos; occasional ecchymoses, otherwise intact without significant lesions or rashes. Psych:  Alert and cooperative. Normal mood and affect.   Lab Results: Recent Labs    11/20/16 1136 11/20/16 1414 11/20/16 1923 11/21/16 0440  WBC  6.8  --  5.9 5.4  HGB 10.4* 9.7* 10.1* 9.8*  HCT 35.3* 32.5* 34.2* 33.1*  PLT 244  --  215 198   BMET Recent Labs    11/20/16 1136 11/21/16 0440  NA 136 139  K 3.9 3.5  CL 105 109  CO2 25 25  GLUCOSE 99 92  BUN 25* 13  CREATININE 1.05 0.90  CALCIUM 9.0 8.6*   LFT Recent Labs    11/20/16 1136  PROT  6.7  ALBUMIN 3.6  AST 16  ALT 14*  ALKPHOS 64  BILITOT 0.5   PT/INR No results for input(s): LABPROT, INR in the last 72 hours.  Studies/Results: Ct Abdomen Pelvis W Contrast  Result Date: 11/20/2016 CLINICAL DATA:  Extreme weakness. Black stools. History of rectal cancer. EXAM: CT ABDOMEN AND PELVIS WITH CONTRAST TECHNIQUE: Multidetector CT imaging of the abdomen and pelvis was performed using the standard protocol following bolus administration of intravenous contrast. CONTRAST:  162mL ISOVUE-300 IOPAMIDOL (ISOVUE-300) INJECTION 61% COMPARISON:  None. FINDINGS: Lower chest: Small hiatal hernia. Hepatobiliary: 8 mm too small to be actually characterized circumscribed hypoattenuated lesion in the caudate lobe of the liver. The gallbladder is decompressed. There is a 4 mm polyp versus calculus adherent to the counter dependent wall of the gallbladder. Pancreas: Unremarkable. No pancreatic ductal dilatation or surrounding inflammatory changes. Spleen: Normal in size without focal abnormality. Adrenals/Urinary Tract: Adrenal glands are unremarkable. Kidneys are normal, without renal calculi, focal lesion, or hydronephrosis. Bladder is unremarkable. Incidental note of benign-appearing left renal cyst. Stomach/Bowel: Stomach is within normal limits. Appendix appears normal. No evidence of bowel wall thickening, distention, or inflammatory changes. Scattered colonic diverticulosis. Vascular/Lymphatic: No significant vascular findings are present. No enlarged abdominal or pelvic lymph nodes. Reproductive: Enlarged heterogeneous prostate 4.9 cm. Other: No abdominal wall hernia or abnormality.  No abdominopelvic ascites. Musculoskeletal: L5-S1 fusion. IMPRESSION: Small hiatal hernia. No apparent by CT colonic or small bowel masses. 8 mm too small to be actually characterized nodule in the liver. 4 mm gallbladder calculus versus polyp. Electronically Signed   By: Fidela Salisbury M.D.   On: 11/20/2016 13:53    Impression:  1.  Melena.  No bleeding since admission. 2.  Acute blood loss anemia. 3.  History of gastric ulcer, EGD May 2018.  H. Pylori seronegative; patient endorses using BC powder's on occasion.  Plan:  1.  Clear liquid diet overnight, NPO now. 2.  PPI. 3.  Volume repletion, follow CBCs. 4.  Endoscopy for further evaluation. 5.  Risks (bleeding, infection, bowel perforation that could require surgery, sedation-related changes in cardiopulmonary systems), benefits (identification and possible treatment of source of symptoms, exclusion of certain causes of symptoms), and alternatives (watchful waiting, radiographic imaging studies, empiric medical treatment) of upper endoscopy (EGD) were explained to patient/family in detail and patient wishes to proceed.   LOS: 1 day   Kainalu Heggs M  11/21/2016, 10:34 AM  Cell [phone] If no answer or after 5 PM call [phone]

## 2016-11-21 NOTE — Transfer of Care (Signed)
Immediate Anesthesia Transfer of Care Note  Patient: Todd Berg  Procedure(s) Performed: ESOPHAGOGASTRODUODENOSCOPY (EGD) WITH PROPOFOL (Left )  Patient Location: PACU  Anesthesia Type:MAC  Level of Consciousness: awake, alert , oriented and patient cooperative  Airway & Oxygen Therapy: Patient Spontanous Breathing and Patient connected to nasal cannula oxygen  Post-op Assessment: Report given to RN and Post -op Vital signs reviewed and stable  Post vital signs: Reviewed and stable  Last Vitals:  Vitals:   11/21/16 1019 11/21/16 1109  BP: (!) 155/77 134/71  Pulse: (!) 59 (!) 56  Resp: (!) 23 17  Temp: 36.4 C (!) 36.3 C  SpO2: 99% 98%    Last Pain:  Vitals:   11/21/16 1109  TempSrc:   PainSc: 0-No pain         Complications: No apparent anesthesia complications

## 2016-11-21 NOTE — Care Management CC44 (Deleted)
Condition Code 44 Documentation Completed  Patient Details  Name: SIRUS LABRIE MRN: [id number] Date of Birth: Jan 07, 1951   Condition Code 44 given:    Patient signature on Condition Code 44 notice:    Documentation of 2 MD's agreement:    Code 44 added to claim:       Delrae Sawyers, RN 11/21/2016, 1:28 PM

## 2016-11-21 NOTE — Care Management Note (Signed)
Case Management Note  Patient Details  Name: Todd Berg MRN: [id number] Date of Birth: 1950/11/11  Subjective/Objective: 66 y.o. Admitted with GIB. To be discharged home with no needs identified                   Action/Plan: CM will sign off for now but will be available should additional discharge needs arise or disposition change.    Expected Discharge Date:  11/21/16               Expected Discharge Plan:     In-House Referral:     Discharge planning Services  CM Consult  Post Acute Care Choice:    Choice offered to:  Patient, Spouse  DME Arranged:    DME Agency:     HH Arranged:    [place] Agency:     Status of Service:  Completed, signed off  If discussed at [place] of Stay Meetings, dates discussed:    Additional Comments:  Delrae Sawyers, RN 11/21/2016, 1:47 PM

## 2016-11-22 ENCOUNTER — Encounter (HOSPITAL_COMMUNITY): Payer: Self-pay | Admitting: Gastroenterology

## 2016-11-26 ENCOUNTER — Other Ambulatory Visit: Payer: Medicare Other

## 2016-11-26 DIAGNOSIS — D62 Acute posthemorrhagic anemia: Secondary | ICD-10-CM | POA: Diagnosis not present

## 2016-11-27 LAB — CBC WITH DIFFERENTIAL/PLATELET
BASOS ABS: 0 10*3/uL (ref 0.0–0.2)
Basos: 1 %
EOS (ABSOLUTE): 0.1 10*3/uL (ref 0.0–0.4)
EOS: 2 %
HEMATOCRIT: 35.1 % — AB (ref 37.5–51.0)
HEMOGLOBIN: 10.4 g/dL — AB (ref 13.0–17.7)
IMMATURE GRANS (ABS): 0 10*3/uL (ref 0.0–0.1)
Immature Granulocytes: 0 %
LYMPHS: 22 %
Lymphocytes Absolute: 1.2 10*3/uL (ref 0.7–3.1)
MCH: 23.7 pg — ABNORMAL LOW (ref 26.6–33.0)
MCHC: 29.6 g/dL — ABNORMAL LOW (ref 31.5–35.7)
MCV: 80 fL (ref 79–97)
MONOCYTES: 6 %
Monocytes Absolute: 0.4 10*3/uL (ref 0.1–0.9)
NEUTROS ABS: 3.8 10*3/uL (ref 1.4–7.0)
Neutrophils: 69 %
Platelets: 257 10*3/uL (ref 150–379)
RBC: 4.39 x10E6/uL (ref 4.14–5.80)
RDW: 16.9 % — ABNORMAL HIGH (ref 12.3–15.4)
WBC: 5.5 10*3/uL (ref 3.4–10.8)

## 2016-11-29 ENCOUNTER — Encounter: Payer: Self-pay | Admitting: Family Medicine

## 2016-11-29 ENCOUNTER — Ambulatory Visit (INDEPENDENT_AMBULATORY_CARE_PROVIDER_SITE_OTHER): Payer: Medicare Other | Admitting: Family Medicine

## 2016-11-29 VITALS — BP 139/77 | HR 83 | Temp 98.5°F | Ht 72.0 in | Wt 204.0 lb

## 2016-11-29 DIAGNOSIS — K921 Melena: Secondary | ICD-10-CM

## 2016-11-29 DIAGNOSIS — D500 Iron deficiency anemia secondary to blood loss (chronic): Secondary | ICD-10-CM

## 2016-12-02 NOTE — Progress Notes (Signed)
BP 139/77   Pulse 83   Temp 98.5 F (36.9 C) (Oral)   Ht 6' (1.829 m)   Wt 204 lb (92.5 kg)   BMI 27.67 kg/m    Subjective:    Patient ID: Todd Berg, male    DOB: Jan 17, 1950, 66 y.o.   MRN: [id number]  HPI: Todd Berg is a 66 y.o. male presenting on 11/29/2016 for Hospitalization Follow-up   HPI Patient is coming for hospital follow-up for melena and blood loss and anemia Patient is coming in today for hospital follow-up for anemia and melena and blood loss.  He had had a previous episode that was similar to this where his blood counts went down and he had a GI bleed.  When he presented to our office he was feeling very fatigued and pale and he was having melena and so he was sent to the emergency department for monitoring.  During the hospital stay his blood counts dipped down to 8.9 but then stabilized did not dip any further.  When he was sent home he was feeling much better and he is feeling much better today.  He says that he does not feel as weak or is sick as he was feeling previously.  He denies any further melena though that is mostly cleared up.  Relevant past medical, surgical, family and social history reviewed and updated as indicated. Interim medical history since our last visit reviewed. Allergies and medications reviewed and updated.  Review of Systems  Constitutional: Negative for chills and fever.  Respiratory: Negative for shortness of breath and wheezing.   Cardiovascular: Negative for chest pain and leg swelling.  Gastrointestinal: Negative for abdominal pain, blood in stool, constipation, nausea and vomiting.  Musculoskeletal: Negative for back pain and gait problem.  Skin: Negative for rash.  All other systems reviewed and are negative.   Per HPI unless specifically indicated above        Objective:    BP 139/77   Pulse 83   Temp 98.5 F (36.9 C) (Oral)   Ht 6' (1.829 m)   Wt 204 lb (92.5 kg)   BMI 27.67 kg/m   Wt Readings from  Last 3 Encounters:  11/29/16 204 lb (92.5 kg)  11/21/16 203 lb (92.1 kg)  11/20/16 203 lb (92.1 kg)    Physical Exam  Constitutional: He is oriented to person, place, and time. He appears well-developed and well-nourished. No distress.  Eyes: Conjunctivae are normal. No scleral icterus.  Neck: Neck supple. No thyromegaly present.  Cardiovascular: Normal rate, regular rhythm, normal heart sounds and intact distal pulses.  No murmur heard. Pulmonary/Chest: Effort normal and breath sounds normal. No respiratory distress. He has no wheezes. He has no rales.  Abdominal: Soft. Bowel sounds are normal. He exhibits no distension. There is no tenderness. There is no rebound.  Musculoskeletal: Normal range of motion. He exhibits no edema.  Lymphadenopathy:    He has no cervical adenopathy.  Neurological: He is alert and oriented to person, place, and time. Coordination normal.  Skin: Skin is warm and dry. No rash noted. He is not diaphoretic.  Psychiatric: He has a normal mood and affect. His behavior is normal.  Nursing note and vitals reviewed.       Assessment & Plan:   Problem List Items Addressed This Visit      Digestive   Melena - Primary   Relevant Orders   CBC with Differential/Platelet     Other   Iron  deficiency anemia due to chronic blood loss   Relevant Orders   CBC with Differential/Platelet       Follow up plan: Return in about 3 months (around 03/01/2017), or if symptoms worsen or fail to improve, for Recheck anemia.  Counseling provided for all of the vaccine components Orders Placed This Encounter  Procedures  . CBC with Differential/Platelet    Caryl Pina, MD [facility] 12/02/2016, 10:55 PM

## 2016-12-10 ENCOUNTER — Ambulatory Visit: Payer: Medicare Other

## 2016-12-13 ENCOUNTER — Encounter: Payer: Self-pay | Admitting: Family Medicine

## 2017-01-06 ENCOUNTER — Encounter: Payer: Self-pay | Admitting: Family

## 2017-01-06 ENCOUNTER — Ambulatory Visit (INDEPENDENT_AMBULATORY_CARE_PROVIDER_SITE_OTHER): Payer: Medicare Other | Admitting: Family

## 2017-01-06 VITALS — BP 134/73 | HR 82 | Temp 96.5°F | Ht 72.0 in | Wt 210.0 lb

## 2017-01-06 DIAGNOSIS — R252 Cramp and spasm: Secondary | ICD-10-CM

## 2017-01-06 MED ORDER — CYCLOBENZAPRINE HCL 5 MG PO TABS: 5.0000 mg | ORAL_TABLET | Freq: Three times a day (TID) | ORAL | 1 refills | Status: DC | PRN

## 2017-01-06 NOTE — Patient Instructions (Signed)
Leg Cramps Leg cramps occur when a muscle or muscles tighten and you have no control over this tightening (involuntary muscle contraction). Muscle cramps can develop in any muscle, but the most common place is in the calf muscles of the leg. Those cramps can occur during exercise or when you are at rest. Leg cramps are painful, and they may last for a few seconds to a few minutes. Cramps may return several times before they finally stop. Usually, leg cramps are not caused by a serious medical problem. In many cases, the cause is not known. Some common causes include:  Overexertion.  Overuse from repetitive motions, or doing the same thing over and over.  Remaining in a certain position for a long period of time.  Improper preparation, form, or technique while performing a sport or an activity.  Dehydration.  Injury.  Side effects of some medicines.  Abnormally low levels of the salts and ions in your blood (electrolytes), especially potassium and calcium. These levels could be low if you are taking water pills (diuretics) or if you are pregnant.  Follow these instructions at home: Watch your condition for any changes. Taking the following actions may help to lessen any discomfort that you are feeling:  Stay well-hydrated. Drink enough fluid to keep your urine clear or pale yellow.  Try massaging, stretching, and relaxing the affected muscle. Do this for several minutes at a time.  For tight or tense muscles, use a warm towel, heating pad, or hot shower water directed to the affected area.  If you are sore or have pain after a cramp, applying ice to the affected area may relieve discomfort. ? Put ice in a plastic bag. ? Place a towel between your skin and the bag. ? Leave the ice on for 20 minutes, 2-3 times per day.  Avoid strenuous exercise for several days if you have been having frequent leg cramps.  Make sure that your diet includes the essential minerals for your muscles to  work normally.  Take medicines only as directed by your health care provider.  Contact a health care provider if:  Your leg cramps get more severe or more frequent, or they do not improve over time.  Your foot becomes cold, numb, or blue. This information is not intended to replace advice given to you by your health care provider. Make sure you discuss any questions you have with your health care provider. Document Released: 02/05/2004 Document Revised: 06/05/2015 Document Reviewed: 12/05/2013 Elsevier Interactive Patient Education  2018 Elsevier Inc.  

## 2017-01-06 NOTE — Progress Notes (Signed)
   Subjective:    Patient ID: Todd Berg, male    DOB: Jun 15, 1950, 66 y.o.   MRN: [id number]   Leg Pain   The incident occurred more than 1 week ago. There was no injury mechanism. The pain is present in the left leg and right leg. The quality of the pain is described as cramping. The pain is at a severity of 10/10. The pain is moderate. The pain has been intermittent since onset. Pertinent negatives include no numbness or tingling. He reports no foreign bodies present. Nothing aggravates the symptoms. He has tried nothing for the symptoms. The treatment provided no relief.      Review of Systems  Neurological: Negative for tingling and numbness.  All other systems reviewed and are negative.      Objective:   Physical Exam  Constitutional: He is oriented to person, place, and time. He appears well-developed and well-nourished. No distress.  HENT:  Head: Normocephalic.  Right Ear: External ear normal.  Left Ear: External ear normal.  Nose: Nose normal.  Mouth/Throat: Oropharynx is clear and moist.  Eyes: Pupils are equal, round, and reactive to light. Right eye exhibits no discharge. Left eye exhibits no discharge.  Neck: Normal range of motion. Neck supple. No thyromegaly present.  Cardiovascular: Normal rate, regular rhythm, normal heart sounds and intact distal pulses.  No murmur heard. Pulmonary/Chest: Effort normal and breath sounds normal. No respiratory distress. He has no wheezes.  Abdominal: Soft. Bowel sounds are normal. He exhibits no distension. There is no tenderness.  Musculoskeletal: Normal range of motion. He exhibits no edema or tenderness.  Neurological: He is alert and oriented to person, place, and time.  Skin: Skin is warm and dry. No rash noted. No erythema.  Psychiatric: He has a normal mood and affect. His behavior is normal. Judgment and thought content normal.  Vitals reviewed.    BP 134/73   Pulse 82   Temp (!) 96.5 F (35.8 C) (Oral)   Ht 6'  (1.829 m)   Wt 210 lb (95.3 kg)   BMI 28.48 kg/m      Assessment & Plan:  1. Bilateral leg cramps Force fluids Labs pending Flexeril as needed RTO prn  - CMP14+EGFR - Magnesium - cyclobenzaprine (FLEXERIL) 5 MG tablet; Take 1 tablet (5 mg total) by mouth 3 (three) times daily as needed for muscle spasms.  Dispense: 90 tablet; Refill: [place], FNP

## 2017-01-07 LAB — CMP14+EGFR
A/G RATIO: 1.5 (ref 1.2–2.2)
ALK PHOS: 79 IU/L (ref 39–117)
ALT: 11 IU/L (ref 0–44)
AST: 12 IU/L (ref 0–40)
Albumin: 4.1 g/dL (ref 3.6–4.8)
BUN/Creatinine Ratio: 20 (ref 10–24)
BUN: 16 mg/dL (ref 8–27)
Bilirubin Total: <0.2 mg/dL (ref 0.0–1.2)
CALCIUM: 9 mg/dL (ref 8.6–10.2)
CO2: 25 mmol/L (ref 20–29)
CREATININE: 0.81 mg/dL (ref 0.76–1.27)
Chloride: 104 mmol/L (ref 96–106)
GFR calc Af Amer: 106 mL/min/{1.73_m2} (ref >59)
GFR, EST NON AFRICAN AMERICAN: 91 mL/min/{1.73_m2} (ref >59)
Globulin, Total: 2.7 g/dL (ref 1.5–4.5)
Glucose: 88 mg/dL (ref 65–99)
POTASSIUM: 4.1 mmol/L (ref 3.5–5.2)
Sodium: 143 mmol/L (ref 134–144)
Total Protein: 6.8 g/dL (ref 6.0–8.5)

## 2017-01-07 LAB — MAGNESIUM: MAGNESIUM: 2.1 mg/dL (ref 1.6–2.3)

## 2017-01-11 DIAGNOSIS — I639 Cerebral infarction, unspecified: Secondary | ICD-10-CM

## 2017-01-11 HISTORY — DX: Cerebral infarction, unspecified: I63.9

## 2017-01-12 ENCOUNTER — Encounter: Payer: Self-pay | Admitting: *Deleted

## 2017-01-15 ENCOUNTER — Encounter: Payer: Self-pay | Admitting: Family Medicine

## 2017-01-15 ENCOUNTER — Ambulatory Visit (INDEPENDENT_AMBULATORY_CARE_PROVIDER_SITE_OTHER): Payer: Medicare Other | Admitting: Family Medicine

## 2017-01-15 VITALS — BP 127/73 | HR 73 | Temp 97.1°F | Ht 72.0 in | Wt 209.0 lb

## 2017-01-15 DIAGNOSIS — K5900 Constipation, unspecified: Secondary | ICD-10-CM

## 2017-01-15 DIAGNOSIS — Z711 Person with feared health complaint in whom no diagnosis is made: Secondary | ICD-10-CM | POA: Diagnosis not present

## 2017-01-15 DIAGNOSIS — Z8719 Personal history of other diseases of the digestive system: Secondary | ICD-10-CM | POA: Diagnosis not present

## 2017-01-15 DIAGNOSIS — Z9889 Other specified postprocedural states: Secondary | ICD-10-CM | POA: Diagnosis not present

## 2017-01-15 DIAGNOSIS — R1032 Left lower quadrant pain: Secondary | ICD-10-CM

## 2017-01-15 MED ORDER — POLYETHYLENE GLYCOL 3350 17 GM/SCOOP PO POWD: ORAL | 0 refills | Status: DC

## 2017-01-15 NOTE — Progress Notes (Signed)
Subjective: CC: groin pain PCP: Todd Berg OVF:IEPPIRJ Todd Berg is a 67 y.o. male presenting to clinic today for:  1. Groin pain/constipation Patient reports that he has had left-sided groin pain for several years now.  He notes a hernia surgery about 6 or 7 years ago in [place].  He notes that discomfort never really improved.  He denies palpable masses.  He denies worsening pain with straining, coughing.  Denies dysuria, hematuria, fevers, chills, nausea, vomiting.  He does report difficulty with moving his bowels and describes that he has to "move it in" order to pass stool.  Denies hematochezia, melena.  He would like to discuss his case with a specialist.  He does not want to go back to the surgeon he saw previously.  2.  Genital concern Patient reports that he feels like his penis is "twisted".  He notes that has been like this for several years.  He thinks that this is gradually getting worse.  He reports that this is interfering with his ability to engage in coitus.  He denies penile discharge or lesions.  No penile pain.  No testicular swelling.   ROS: Per HPI  No Known Allergies Past Medical History:  Diagnosis Date  . Cancer ([place]) 2015   rectal  . Chronic back pain   . COPD (chronic obstructive pulmonary disease) ([place])   . GERD (gastroesophageal reflux disease)   . Hematemesis   . Hemorrhoids   . Hypotension   . Melena     Current Outpatient Medications:  .  cyclobenzaprine (FLEXERIL) 5 MG tablet, Take 1 tablet (5 mg total) by mouth 3 (three) times daily as needed for muscle spasms., Disp: 90 tablet, Rfl: 1 .  pantoprazole (PROTONIX) 40 MG tablet, Take 1 tablet (40 mg total) daily by mouth., Disp: 60 tablet, Rfl: 3 .  sodium chloride (OCEAN) 0.65 % SOLN nasal spray, Place 2 sprays as needed into both nostrils for congestion., Disp: , Rfl:  .  tetrahydrozoline 0.05 % ophthalmic solution, Place 1 drop daily as needed into both eyes (itching)., Disp: , Rfl:    Social History   Socioeconomic History  . Marital status: Married    Spouse name: Not on file  . Number of children: Not on file  . Years of education: Not on file  . Highest education level: Not on file  Social Needs  . Financial resource strain: Not on file  . Food insecurity - worry: Not on file  . Food insecurity - inability: Not on file  . Transportation needs - medical: Not on file  . Transportation needs - non-medical: Not on file  Occupational History  . Not on file  Tobacco Use  . Smoking status: Former Smoker    Start date: 12/30/1964    Last attempt to quit: 06/19/2009    Years since quitting: 7.5  . Smokeless tobacco: Former Systems developer    Types: Snuff    Quit date: 09/26/2005  Substance and Sexual Activity  . Alcohol use: No  . Drug use: No  . Sexual activity: Not on file  Other Topics Concern  . Not on file  Social History Narrative  . Not on file   Family History  Problem Relation Age of Onset  . COPD Mother   . Stroke Father     Objective: Office vital signs reviewed. BP 127/73   Pulse 73   Temp (!) 97.1 F (36.2 C) (Oral)   Ht 6' (1.829 m)   [employer] [facility]  209 lb (94.8 kg)   BMI 28.35 kg/m   Physical Examination:  General: Awake, alert, elderly male, No acute distress HEENT: sclera white, MMM GI: no palpable masses.  Inguinal herniation not appreciated.  He does have tenderness out of proportion to exam in the inguinal area.  There are no associated skin changes or palpable abnormalities. GU: no palpable testicular masses. No appreciable swelling, redness or lesions of the penis or testicles.  No penile contracture seen.  Assessment/ Plan: 67 y.o. male   1. Inguinal pain, left There were no palpable abnormalities at the area of concern.  Likely related to postsurgical scarring in the setting of history of hernia repair in this area.  However, patient does wish to have a second opinion and therefore has been referred to general surgery. - Ambulatory referral  to General Surgery  2. History of repair of inguinal hernia - Ambulatory referral to General Surgery  3. Concern about male genital disease without diagnosis What he describes sounds like Peyronie's but no abnormalities were appreciable on today's exam.  He wishes to see a urologist for further evaluation and management.  This referral has been placed. - Ambulatory referral to Urology  4. Constipation, unspecified constipation type MiraLAX prescribed today.  Instructions for use were reviewed with the patient.  Handout provided.  Follow-up with PCP as needed.   Orders Placed This Encounter  Procedures  . Ambulatory referral to Urology    Referral Priority:   Routine    Referral Type:   Consultation    Referral Reason:   Specialty Services Required    Requested Specialty:   Urology    Number of Visits Requested:   1  . Ambulatory referral to General Surgery    Referral Priority:   Elective    Referral Type:   Surgical    Referral Reason:   Specialty Services Required    Requested Specialty:   General Surgery    Number of Visits Requested:   1   Meds ordered this encounter  Medications  . polyethylene glycol powder (MIRALAX) powder    Sig: Mix 1 capful in 8 ounces of water and drink daily as needed for constipation.    Dispense:  255 g    Refill:  [place], DO [place] [phone]

## 2017-01-15 NOTE — Patient Instructions (Signed)
I have sent you an MiraLAX for constipation.  As we discussed, you will mix 1 capful in 8 ounces of water and drink daily as needed for constipation.  If you have diarrhea from this, you may decrease to 1/2 capful daily.   Constipation, Adult Constipation is when a person:  Poops (has a bowel movement) fewer times in a week than normal.  Has a hard time pooping.  Has poop that is dry, hard, or bigger than normal.  Follow these instructions at home: Eating and drinking   Eat foods that have a lot of fiber, such as: ? Fresh fruits and vegetables. ? Whole grains. ? Beans.  Eat less of foods that are high in fat, low in fiber, or overly processed, such as: ? [place] fries. ? Hamburgers. ? Cookies. ? Candy. ? Soda.  Drink enough fluid to keep your pee (urine) clear or pale yellow. General instructions  Exercise regularly or as told by your doctor.  Go to the restroom when you feel like you need to poop. Do not hold it in.  Take over-the-counter and prescription medicines only as told by your doctor. These include any fiber supplements.  Do pelvic floor retraining exercises, such as: ? Doing deep breathing while relaxing your lower belly (abdomen). ? Relaxing your pelvic floor while pooping.  Watch your condition for any changes.  Keep all follow-up visits as told by your doctor. This is important. Contact a doctor if:  You have pain that gets worse.  You have a fever.  You have not pooped for 4 days.  You throw up (vomit).  You are not hungry.  You lose weight.  You are bleeding from the anus.  You have thin, pencil-like poop (stool). Get help right away if:  You have a fever, and your symptoms suddenly get worse.  You leak poop or have blood in your poop.  Your belly feels hard or bigger than normal (is bloated).  You have very bad belly pain.  You feel dizzy or you faint. This information is not intended to replace advice given to you by your health  care provider. Make sure you discuss any questions you have with your health care provider. Document Released: 06/16/2007 Document Revised: 07/18/2015 Document Reviewed: 06/18/2015 Elsevier Interactive Patient Education  2018 [employer].

## 2017-02-01 ENCOUNTER — Ambulatory Visit (INDEPENDENT_AMBULATORY_CARE_PROVIDER_SITE_OTHER): Payer: Medicare Other | Admitting: Urology

## 2017-02-01 DIAGNOSIS — N5201 Erectile dysfunction due to arterial insufficiency: Secondary | ICD-10-CM

## 2017-02-01 DIAGNOSIS — N486 Induration penis plastica: Secondary | ICD-10-CM

## 2017-02-08 ENCOUNTER — Ambulatory Visit: Payer: Medicare Other

## 2017-02-09 ENCOUNTER — Ambulatory Visit (INDEPENDENT_AMBULATORY_CARE_PROVIDER_SITE_OTHER): Payer: Medicare Other | Admitting: Family Medicine

## 2017-02-09 ENCOUNTER — Encounter: Payer: Self-pay | Admitting: Family Medicine

## 2017-02-09 VITALS — BP 133/87 | HR 68 | Temp 96.9°F | Ht 72.0 in | Wt 209.0 lb

## 2017-02-09 DIAGNOSIS — K2100 Gastro-esophageal reflux disease with esophagitis, without bleeding: Secondary | ICD-10-CM

## 2017-02-09 DIAGNOSIS — R51 Headache: Secondary | ICD-10-CM

## 2017-02-09 DIAGNOSIS — R519 Headache, unspecified: Secondary | ICD-10-CM

## 2017-02-09 MED ORDER — OMEPRAZOLE 20 MG PO CPDR
20.0000 mg | DELAYED_RELEASE_CAPSULE | Freq: Two times a day (BID) | ORAL | 3 refills | Status: DC
Start: 2017-02-09 — End: 2017-09-06

## 2017-02-09 MED ORDER — PREDNISONE 20 MG PO TABS: ORAL_TABLET | ORAL | 0 refills | Status: DC

## 2017-02-09 NOTE — Progress Notes (Signed)
BP 133/87   Pulse 68   Temp (!) 96.9 F (36.1 C) (Oral)   Ht 6' (1.829 m)   Wt 209 lb (94.8 kg)   BMI 28.35 kg/m    Subjective:    Patient ID: Todd Berg, male    DOB: Feb 02, 1950, 67 y.o.   MRN: [id number]  HPI: Todd Berg is a 67 y.o. male presenting on 02/09/2017 for Heartburn, reflux (taking Pepto-Bismol and Protonix); and Frequent headaches   HPI Sinus headaches Patient comes in complaining of headache in the frontal region and sinus drainage and pressure and nasal congestion did he denies any fevers or chills or shortness of breath or wheezing.  He says this is been going on for about 1 week.  Patient has been using some Tylenol Sinus medication which has not been helping much.  He denies any sick contacts that he knows of.  He says mostly is the headache that is bothering him which the Tylenol did help with the headache but not with the drainage or congestion.  Heartburn Patient has known GERD but says it has been really flaring up recently.  He has been using Protonix 40 mg daily and he says is just not cutting it anymore and he has been on it for quite some time.  He denies any blood in his stool.  He has had a history of GI bleeds.  He denies any abdominal pain but just has more the indigestion and heartburn into his chest at times.  He says is worse at night than during the day.  He says is been dealing with this worse over the past month.  Relevant past medical, surgical, family and social history reviewed and updated as indicated. Interim medical history since our last visit reviewed. Allergies and medications reviewed and updated.  Review of Systems  Constitutional: Negative for chills and fever.  HENT: Positive for congestion, postnasal drip, rhinorrhea, sinus pressure, sneezing and sore throat. Negative for ear discharge, ear pain and voice change.   Eyes: Negative for pain, discharge, redness and visual disturbance.  Respiratory: Positive for cough. Negative  for shortness of breath and wheezing.   Cardiovascular: Negative for chest pain and leg swelling.  Gastrointestinal: Positive for abdominal distention. Negative for abdominal pain, blood in stool, constipation, diarrhea, nausea and vomiting.  Musculoskeletal: Negative for gait problem.  Skin: Negative for rash.  All other systems reviewed and are negative.   Per HPI unless specifically indicated above   Allergies as of 02/09/2017   No Known Allergies     Medication List        Accurate as of 02/09/17  1:28 PM. Always use your most recent med list.          cyclobenzaprine 5 MG tablet Commonly known as:  FLEXERIL Take 1 tablet (5 mg total) by mouth 3 (three) times daily as needed for muscle spasms.   omeprazole 20 MG capsule Commonly known as:  PRILOSEC Take 1 capsule (20 mg total) by mouth 2 (two) times daily before a meal.   polyethylene glycol powder powder Commonly known as:  MIRALAX Mix 1 capful in 8 ounces of water and drink daily as needed for constipation.   predniSONE 20 MG tablet Commonly known as:  DELTASONE 2 po at same time daily for 5 days   sodium chloride 0.65 % Soln nasal spray Commonly known as:  OCEAN Place 2 sprays as needed into both nostrils for congestion.   tetrahydrozoline 0.05 % ophthalmic solution  Place 1 drop daily as needed into both eyes (itching).          Objective:    BP 133/87   Pulse 68   Temp (!) 96.9 F (36.1 C) (Oral)   Ht 6' (1.829 m)   Wt 209 lb (94.8 kg)   BMI 28.35 kg/m   Wt Readings from Last 3 Encounters:  02/09/17 209 lb (94.8 kg)  01/15/17 209 lb (94.8 kg)  01/06/17 210 lb (95.3 kg)    Physical Exam  Constitutional: He is oriented to person, place, and time. He appears well-developed and well-nourished. No distress.  HENT:  Right Ear: Tympanic membrane, external ear and ear canal normal.  Left Ear: Tympanic membrane, external ear and ear canal normal.  Nose: Mucosal edema and rhinorrhea present. No sinus  tenderness. No epistaxis. Right sinus exhibits maxillary sinus tenderness. Right sinus exhibits no frontal sinus tenderness. Left sinus exhibits maxillary sinus tenderness. Left sinus exhibits no frontal sinus tenderness.  Mouth/Throat: Uvula is midline and mucous membranes are normal. Posterior oropharyngeal edema and posterior oropharyngeal erythema present. No oropharyngeal exudate or tonsillar abscesses.  Eyes: Conjunctivae are normal. No scleral icterus.  Neck: Neck supple. No thyromegaly present.  Cardiovascular: Normal rate, regular rhythm, normal heart sounds and intact distal pulses.  No murmur heard. Pulmonary/Chest: Effort normal and breath sounds normal. No respiratory distress. He has no wheezes. He has no rales.  Abdominal: Soft. Bowel sounds are normal. He exhibits no distension. There is no tenderness. There is no rebound.  Musculoskeletal: Normal range of motion. He exhibits no edema.  Lymphadenopathy:    He has no cervical adenopathy.  Neurological: He is alert and oriented to person, place, and time. Coordination normal.  Skin: Skin is warm and dry. No rash noted. He is not diaphoretic.  Psychiatric: He has a normal mood and affect. His behavior is normal.  Nursing note and vitals reviewed.       Assessment & Plan:   Problem List Items Addressed This Visit      Digestive   GERD (gastroesophageal reflux disease)   Relevant Medications   omeprazole (PRILOSEC) 20 MG capsule    Other Visit Diagnoses    Sinus headache    -  Primary   Relevant Medications   predniSONE (DELTASONE) 20 MG tablet       Follow up plan: Return if symptoms worsen or fail to improve.  Counseling provided for all of the vaccine components No orders of the defined types were placed in this encounter.   Caryl Pina, MD [facility] 02/09/2017, 1:28 PM

## 2017-02-15 ENCOUNTER — Ambulatory Visit: Payer: Medicare Other | Admitting: Urology

## 2017-03-02 ENCOUNTER — Telehealth: Payer: Self-pay | Admitting: Family Medicine

## 2017-03-02 ENCOUNTER — Ambulatory Visit: Payer: Medicare Other | Admitting: Family Medicine

## 2017-03-02 NOTE — Telephone Encounter (Signed)
Calls will not go through to patient's reported phone numbers.   Patient would need an office visit, possible back x-rays and referral to Orthopedics to pursue having a back brace prescribed.  All documentation and tests would have to be approved by insurance. Insurance would require supporting  diagnosis by treating providers that have tried alternative treatments with no helpful results.

## 2017-03-03 ENCOUNTER — Ambulatory Visit: Payer: Medicare Other | Admitting: Family Medicine

## 2017-03-04 ENCOUNTER — Encounter: Payer: Self-pay | Admitting: Family Medicine

## 2017-03-17 ENCOUNTER — Ambulatory Visit (INDEPENDENT_AMBULATORY_CARE_PROVIDER_SITE_OTHER): Payer: Medicare Other | Admitting: Family Medicine

## 2017-03-17 ENCOUNTER — Encounter: Payer: Self-pay | Admitting: Family Medicine

## 2017-03-17 VITALS — BP 150/80 | HR 99 | Temp 97.2°F | Ht 72.0 in | Wt 206.0 lb

## 2017-03-17 DIAGNOSIS — D500 Iron deficiency anemia secondary to blood loss (chronic): Secondary | ICD-10-CM

## 2017-03-17 DIAGNOSIS — J410 Simple chronic bronchitis: Secondary | ICD-10-CM | POA: Diagnosis not present

## 2017-03-17 DIAGNOSIS — Z13220 Encounter for screening for lipoid disorders: Secondary | ICD-10-CM | POA: Diagnosis not present

## 2017-03-17 DIAGNOSIS — M5136 Other intervertebral disc degeneration, lumbar region: Secondary | ICD-10-CM | POA: Diagnosis not present

## 2017-03-17 DIAGNOSIS — R7989 Other specified abnormal findings of blood chemistry: Secondary | ICD-10-CM | POA: Diagnosis not present

## 2017-03-17 DIAGNOSIS — M51369 Other intervertebral disc degeneration, lumbar region without mention of lumbar back pain or lower extremity pain: Secondary | ICD-10-CM | POA: Insufficient documentation

## 2017-03-17 DIAGNOSIS — K2100 Gastro-esophageal reflux disease with esophagitis, without bleeding: Secondary | ICD-10-CM

## 2017-03-17 NOTE — Progress Notes (Signed)
BP (!) 150/80   Pulse 99   Temp (!) 97.2 F (36.2 C) (Oral)   Ht 6' (1.829 m)   Wt 206 lb (93.4 kg)   BMI 27.94 kg/m    Subjective:    Patient ID: Todd Berg, male    DOB: January 30, 1950, 67 y.o.   MRN: [id number]  HPI: Todd Berg is a 67 y.o. male presenting on 03/17/2017 for 3 mo   HPI GERD Patient has a history of acid reflux with ulcers that have bled and is ended up anemic with transfusions.  He denies any melena or bleeding from his stool.  He says his acid comes up and accept only intermittently patient is currently on omeprazole.  She denies any major symptoms or abdominal pain or belching or burping. She denies any blood in her stool or lightheadedness or dizziness.    COPD Patient is coming in for COPD recheck today.  He is currently on no inhalers and we are monitoring.  He has a mild chronic cough but denies any major coughing spells or wheezing spells.  He has 0nighttime symptoms per week and 0daytime symptoms per week currently.   Back pain and posture issues Patient has had continued back pain and posture issues that is chronic, he had operations on his back about 20 years ago over a couple occasions and cannot remember the specific types of surgery, he would like to go back and see orthopedic and get a back brace from them.  Relevant past medical, surgical, family and social history reviewed and updated as indicated. Interim medical history since our last visit reviewed. Allergies and medications reviewed and updated.  Review of Systems  Constitutional: Negative for chills and fever.  Respiratory: Negative for cough, shortness of breath and wheezing.   Cardiovascular: Negative for chest pain and leg swelling.  Gastrointestinal: Negative for abdominal distention, abdominal pain, blood in stool, constipation, diarrhea, nausea and vomiting.  Musculoskeletal: Positive for arthralgias and back pain. Negative for gait problem.  Skin: Negative for rash.  All other  systems reviewed and are negative.   Per HPI unless specifically indicated above   Allergies as of 03/17/2017   No Known Allergies     Medication List        Accurate as of 03/17/17 10:39 AM. Always use your most recent med list.          cyclobenzaprine 5 MG tablet Commonly known as:  FLEXERIL Take 1 tablet (5 mg total) by mouth 3 (three) times daily as needed for muscle spasms.   omeprazole 20 MG capsule Commonly known as:  PRILOSEC Take 1 capsule (20 mg total) by mouth 2 (two) times daily before a meal.   polyethylene glycol powder powder Commonly known as:  MIRALAX Mix 1 capful in 8 ounces of water and drink daily as needed for constipation.   sodium chloride 0.65 % Soln nasal spray Commonly known as:  OCEAN Place 2 sprays as needed into both nostrils for congestion.   tetrahydrozoline 0.05 % ophthalmic solution Place 1 drop daily as needed into both eyes (itching).          Objective:    BP (!) 150/80   Pulse 99   Temp (!) 97.2 F (36.2 C) (Oral)   Ht 6' (1.829 m)   Wt 206 lb (93.4 kg)   BMI 27.94 kg/m   Wt Readings from Last 3 Encounters:  03/17/17 206 lb (93.4 kg)  02/09/17 209 lb (94.8 kg)  01/15/17  209 lb (94.8 kg)    Physical Exam  Constitutional: He is oriented to person, place, and time. He appears well-developed and well-nourished. No distress.  Eyes: Conjunctivae are normal. No scleral icterus.  Neck: Neck supple. No thyromegaly present.  Cardiovascular: Normal rate, regular rhythm, normal heart sounds and intact distal pulses.  No murmur heard. Pulmonary/Chest: Effort normal and breath sounds normal. No respiratory distress. He has no wheezes. He has no rales.  Abdominal: Soft. Bowel sounds are normal. He exhibits no distension. There is no tenderness. There is no rebound and no guarding.  Lymphadenopathy:    He has no cervical adenopathy.  Neurological: He is alert and oriented to person, place, and time. Coordination normal.  Skin: Skin  is warm and dry. No rash noted. He is not diaphoretic.  Psychiatric: He has a normal mood and affect. His behavior is normal.  Nursing note and vitals reviewed.       Assessment & Plan:   Problem List Items Addressed This Visit      Respiratory   COPD (chronic obstructive pulmonary disease) ([place])     Digestive   GERD (gastroesophageal reflux disease) - Primary   Relevant Orders   CBC with Differential/Platelet   CMP14+EGFR     Musculoskeletal and Integument   Degenerative disc disease, lumbar    History of spinal surgeries, would like a back brace, will refer to orthopedic for back bracing.      Relevant Orders   Ambulatory referral to Orthopedic Surgery     Other   Iron deficiency anemia due to chronic blood loss   Relevant Orders   CBC with Differential/Platelet    Other Visit Diagnoses    Lipid screening       Relevant Orders   Lipid panel       Follow up plan: Return in about 6 months (around 09/17/2017), or if symptoms worsen or fail to improve, for Follow-up COPD GERD and anemia.  Counseling provided for all of the vaccine components Orders Placed This Encounter  Procedures  . CBC with Differential/Platelet  . CMP14+EGFR  . Lipid panel    Caryl Pina, MD [facility] 03/17/2017, 10:39 AM

## 2017-03-17 NOTE — Assessment & Plan Note (Signed)
History of spinal surgeries, would like a back brace, will refer to orthopedic for back bracing.

## 2017-03-18 ENCOUNTER — Telehealth: Payer: Self-pay | Admitting: *Deleted

## 2017-03-18 LAB — CMP14+EGFR
A/G RATIO: 1.3 (ref 1.2–2.2)
ALT: 9 IU/L (ref 0–44)
AST: 12 IU/L (ref 0–40)
Albumin: 3.9 g/dL (ref 3.6–4.8)
Alkaline Phosphatase: 74 IU/L (ref 39–117)
BILIRUBIN TOTAL: 0.3 mg/dL (ref 0.0–1.2)
BUN/Creatinine Ratio: 19 (ref 10–24)
BUN: 16 mg/dL (ref 8–27)
CHLORIDE: 103 mmol/L (ref 96–106)
CO2: 26 mmol/L (ref 20–29)
Calcium: 9.5 mg/dL (ref 8.6–10.2)
Creatinine, Ser: 0.86 mg/dL (ref 0.76–1.27)
GFR calc non Af Amer: 89 mL/min/{1.73_m2} (ref >59)
GFR, EST AFRICAN AMERICAN: 103 mL/min/{1.73_m2} (ref >59)
Globulin, Total: 2.9 g/dL (ref 1.5–4.5)
Glucose: 77 mg/dL (ref 65–99)
Potassium: 4.3 mmol/L (ref 3.5–5.2)
Sodium: 142 mmol/L (ref 134–144)
Total Protein: 6.8 g/dL (ref 6.0–8.5)

## 2017-03-18 LAB — LIPID PANEL
Chol/HDL Ratio: 4.4 ratio (ref 0.0–5.0)
Cholesterol, Total: 207 mg/dL — ABNORMAL HIGH (ref 100–199)
HDL: 47 mg/dL (ref >39)
LDL Calculated: 139 mg/dL — ABNORMAL HIGH (ref 0–99)
Triglycerides: 104 mg/dL (ref 0–149)
VLDL Cholesterol Cal: 21 mg/dL (ref 5–40)

## 2017-03-18 LAB — CBC WITH DIFFERENTIAL/PLATELET
BASOS: 1 %
Basophils Absolute: 0.1 10*3/uL (ref 0.0–0.2)
EOS (ABSOLUTE): 0.2 10*3/uL (ref 0.0–0.4)
Eos: 3 %
Hematocrit: 36 % — ABNORMAL LOW (ref 37.5–51.0)
Hemoglobin: 11.4 g/dL — ABNORMAL LOW (ref 13.0–17.7)
Immature Grans (Abs): 0 10*3/uL (ref 0.0–0.1)
Immature Granulocytes: 0 %
Lymphocytes Absolute: 1.6 10*3/uL (ref 0.7–3.1)
Lymphs: 24 %
MCH: 24.6 pg — AB (ref 26.6–33.0)
MCHC: 31.7 g/dL (ref 31.5–35.7)
MCV: 78 fL — AB (ref 79–97)
MONOS ABS: 0.4 10*3/uL (ref 0.1–0.9)
Monocytes: 6 %
Neutrophils Absolute: 4.5 10*3/uL (ref 1.4–7.0)
Neutrophils: 66 %
PLATELETS: 282 10*3/uL (ref 150–379)
RBC: 4.63 x10E6/uL (ref 4.14–5.80)
RDW: 16.2 % — AB (ref 12.3–15.4)
WBC: 6.8 10*3/uL (ref 3.4–10.8)

## 2017-03-18 NOTE — Telephone Encounter (Signed)
Thank you yes that is something that we can discuss in a visit if she comes with him that might be beneficial to help discuss it and discuss possible medications that we could use for this.

## 2017-03-18 NOTE — Telephone Encounter (Signed)
Wife aware

## 2017-03-18 NOTE — Telephone Encounter (Signed)
Patients wife wanted to let you know that patient is having a hard time with his temper and gets agitated real easy. Wife advised that he would probably need to be seen again but I would let you know.

## 2017-03-21 ENCOUNTER — Ambulatory Visit (INDEPENDENT_AMBULATORY_CARE_PROVIDER_SITE_OTHER): Payer: Medicare Other | Admitting: Family Medicine

## 2017-03-21 ENCOUNTER — Encounter: Payer: Self-pay | Admitting: Family Medicine

## 2017-03-21 VITALS — BP 168/90 | HR 61 | Temp 98.1°F | Ht 72.0 in | Wt 206.0 lb

## 2017-03-21 DIAGNOSIS — I10 Essential (primary) hypertension: Secondary | ICD-10-CM | POA: Insufficient documentation

## 2017-03-21 DIAGNOSIS — F411 Generalized anxiety disorder: Secondary | ICD-10-CM | POA: Insufficient documentation

## 2017-03-21 MED ORDER — AMLODIPINE BESYLATE 2.5 MG PO TABS: 2.5000 mg | ORAL_TABLET | Freq: Every day | ORAL | 1 refills | Status: DC

## 2017-03-21 MED ORDER — ESCITALOPRAM OXALATE 10 MG PO TABS: 10.0000 mg | ORAL_TABLET | Freq: Every day | ORAL | 1 refills | Status: DC

## 2017-03-21 NOTE — Progress Notes (Signed)
BP (!) 168/90   Pulse 61   Temp 98.1 F (36.7 C) (Oral)   Ht 6' (1.829 m)   Wt 206 lb (93.4 kg)   BMI 27.94 kg/m    Subjective:    Patient ID: Todd Berg, male    DOB: [birth year], 67 y.o.   MRN: [id number]  HPI: Todd Berg is a 67 y.o. male presenting on 03/21/2017 for Anxiety and Short tempered   HPI Hypertension Patient is currently on no medication but his blood pressure is elevated so we will start 1, and their blood pressure today is 168/90. Patient denies any lightheadedness or dizziness. Patient denies headaches, blurred vision, chest pains, shortness of breath, or weakness. Denies any side effects from medication and is content with current medication.   General anxiety disorder Patient and his wife are both here and says that he has been a lot more anxious recently and short tempered and because of the stresses even developed a little bit of a tremor in his hand that is only there is some of the time.  He says that this has really been picking up over the past year and he is finally seeking treatment because his wife wants him to seek treatment.  He denies any suicidal ideations or major depression or sadness.  Relevant past medical, surgical, family and social history reviewed and updated as indicated. Interim medical history since our last visit reviewed. Allergies and medications reviewed and updated.  Review of Systems  Constitutional: Negative for chills and fever.  Respiratory: Negative for shortness of breath and wheezing.   Cardiovascular: Negative for chest pain and leg swelling.  Musculoskeletal: Negative for back pain and gait problem.  Skin: Negative for rash.  Neurological: Negative for dizziness, weakness, numbness and headaches.  Psychiatric/Behavioral: Negative for decreased concentration, dysphoric mood, self-injury, sleep disturbance and suicidal ideas. The patient is nervous/anxious.   All other systems reviewed and are negative.   Per HPI  unless specifically indicated above   Allergies as of 03/21/2017   No Known Allergies     Medication List        Accurate as of 03/21/17 10:58 AM. Always use your most recent med list.          amLODipine 2.5 MG tablet Commonly known as:  NORVASC Take 1 tablet (2.5 mg total) by mouth daily.   cyclobenzaprine 5 MG tablet Commonly known as:  FLEXERIL Take 1 tablet (5 mg total) by mouth 3 (three) times daily as needed for muscle spasms.   escitalopram 10 MG tablet Commonly known as:  LEXAPRO Take 1 tablet (10 mg total) by mouth daily.   omeprazole 20 MG capsule Commonly known as:  PRILOSEC Take 1 capsule (20 mg total) by mouth 2 (two) times daily before a meal.   penicillin v potassium 500 MG tablet Commonly known as:  VEETID   polyethylene glycol powder powder Commonly known as:  MIRALAX Mix 1 capful in 8 ounces of water and drink daily as needed for constipation.   sodium chloride 0.65 % Soln nasal spray Commonly known as:  OCEAN Place 2 sprays as needed into both nostrils for congestion.   tetrahydrozoline 0.05 % ophthalmic solution Place 1 drop daily as needed into both eyes (itching).          Objective:    BP (!) 168/90   Pulse 61   Temp 98.1 F (36.7 C) (Oral)   Ht 6' (1.829 m)   Wt 206 lb (93.4 kg)  BMI 27.94 kg/m   Wt Readings from Last 3 Encounters:  03/21/17 206 lb (93.4 kg)  03/17/17 206 lb (93.4 kg)  02/09/17 209 lb (94.8 kg)    Physical Exam  Constitutional: He is oriented to person, place, and time. He appears well-developed and well-nourished. No distress.  Eyes: Conjunctivae are normal. No scleral icterus.  Neck: Neck supple. No thyromegaly present.  Cardiovascular: Normal rate, regular rhythm, normal heart sounds and intact distal pulses.  No murmur heard. Pulmonary/Chest: Effort normal and breath sounds normal. No respiratory distress. He has no wheezes.  Musculoskeletal: Normal range of motion. He exhibits no edema.    Lymphadenopathy:    He has no cervical adenopathy.  Neurological: He is alert and oriented to person, place, and time. Coordination normal.  Skin: Skin is warm and dry. No rash noted. He is not diaphoretic.  Psychiatric: His behavior is normal. Judgment normal. His mood appears anxious. He does not exhibit a depressed mood. He expresses no suicidal ideation. He expresses no suicidal plans.  Nursing note and vitals reviewed.       Assessment & Plan:   Problem List Items Addressed This Visit      Cardiovascular and Mediastinum   Hypertension   Relevant Medications   amLODipine (NORVASC) 2.5 MG tablet     Other   Generalized anxiety disorder - Primary   Relevant Medications   escitalopram (LEXAPRO) 10 MG tablet       Follow up plan: Return in about 4 weeks (around 04/18/2017), or if symptoms worsen or fail to improve, for Hypertension recheck.  Counseling provided for all of the vaccine components No orders of the defined types were placed in this encounter.   Caryl Pina, MD [facility] 03/21/2017, 10:58 AM

## 2017-03-30 ENCOUNTER — Ambulatory Visit (INDEPENDENT_AMBULATORY_CARE_PROVIDER_SITE_OTHER): Payer: Medicare Other

## 2017-03-30 ENCOUNTER — Telehealth: Payer: Self-pay | Admitting: Family Medicine

## 2017-03-30 VITALS — BP 133/72 | HR 60

## 2017-03-30 DIAGNOSIS — I10 Essential (primary) hypertension: Secondary | ICD-10-CM

## 2017-03-30 NOTE — Progress Notes (Signed)
Patient came by to have BP checked. States that he hasn't been out of the house in a while. Advised that BP was good at this time and we would contact him with further instructions.

## 2017-03-30 NOTE — Telephone Encounter (Signed)
Patient has been experiencing dizziness and headache since he began the Amlodipine.  They do not have a BP monitor at home so they are unsure of what his readings are.  Please advise.

## 2017-03-30 NOTE — Telephone Encounter (Signed)
Patient's wife aware.  She will try to get him to either come here or go to the fire department and have the BP checked and will call us with the result.

## 2017-03-30 NOTE — Telephone Encounter (Signed)
Have him drop by here and get his blood pressure checked or go by the fire department and check it or go by the pharmacy and check it and then let us know

## 2017-03-30 NOTE — Progress Notes (Signed)
My thought is that he still just getting used to having a normal blood pressure, have him continue to take it and let us know if he continues to have symptoms over the next week.

## 2017-03-31 ENCOUNTER — Ambulatory Visit (INDEPENDENT_AMBULATORY_CARE_PROVIDER_SITE_OTHER): Payer: Medicare Other | Admitting: Orthopaedic Surgery

## 2017-03-31 ENCOUNTER — Telehealth: Payer: Self-pay | Admitting: Family Medicine

## 2017-03-31 NOTE — Telephone Encounter (Signed)
Pt's wife states the pt is complaining of being sick on his stomach and dizzy which she thinks is the new BP medicine? What should she do?

## 2017-03-31 NOTE — Telephone Encounter (Signed)
I still think that he is just adapting to having a normal blood pressure because he is been used to having it so high so I would like him to give it a little bit longer on the medication.  Make sure he takes it with food.  If he wants to he can cut it in half for the next few days and then start taking a whole pill

## 2017-03-31 NOTE — Telephone Encounter (Signed)
Pt's wife notified of recommendation Verbalizes understanding 

## 2017-04-08 ENCOUNTER — Ambulatory Visit (INDEPENDENT_AMBULATORY_CARE_PROVIDER_SITE_OTHER): Payer: Medicare Other | Admitting: Family Medicine

## 2017-04-08 ENCOUNTER — Encounter: Payer: Self-pay | Admitting: Family Medicine

## 2017-04-08 VITALS — BP 144/81 | HR 60 | Temp 96.6°F | Ht 72.0 in | Wt 202.0 lb

## 2017-04-08 DIAGNOSIS — L308 Other specified dermatitis: Secondary | ICD-10-CM

## 2017-04-08 DIAGNOSIS — F411 Generalized anxiety disorder: Secondary | ICD-10-CM

## 2017-04-08 MED ORDER — TRIAMCINOLONE ACETONIDE 0.1 % EX CREA: 1.0000 "application " | TOPICAL_CREAM | Freq: Two times a day (BID) | CUTANEOUS | 0 refills | Status: DC

## 2017-04-08 MED ORDER — ESCITALOPRAM OXALATE 20 MG PO TABS: 20.0000 mg | ORAL_TABLET | Freq: Every day | ORAL | 1 refills | Status: DC

## 2017-04-08 NOTE — Progress Notes (Signed)
BP (!) 144/81   Pulse 60   Temp (!) 96.6 F (35.9 C) (Oral)   Ht 6' (1.829 m)   Wt 202 lb (91.6 kg)   BMI 27.40 kg/m    Subjective:    Patient ID: Todd Berg, male    DOB: 05/07/1950, 67 y.o.   MRN: [id number]  HPI: Todd Berg is a 67 y.o. male presenting on 04/08/2017 for Rash (itchy rash on legs)   HPI Itchy rash on legs Patient comes in complaining of itchy rash on his legs.  He says it has been there over the past few months but is really worsened over the past couple weeks.  He says it is very pruritic but not painful and denies any drainage.  The rash is a slight pink scaly rash that is scattered in little splotches over his legs and he says that is about the way that is always look.  He denies any rash anywhere else on his body besides his lower legs.  He denies any fevers or chills or shortness of breath or wheezing.  Recheck on anxiety Patient's wife is with him here today and she tells that he is still very anxious and she has not noticed any difference from the medication that is currently taking for anxiety.  She says that he just gets worked up and very anxious about things a lot more easily than he used to.  She denies him having any major depression and he denies that as well.  He denies any suicidal ideations or thoughts of hurting himself.  Support 10 and would like to increase it.  He denies any side effects from medication  Relevant past medical, surgical, family and social history reviewed and updated as indicated. Interim medical history since our last visit reviewed. Allergies and medications reviewed and updated.  Review of Systems  Constitutional: Negative for chills and fever.  Respiratory: Negative for shortness of breath and wheezing.   Cardiovascular: Negative for chest pain and leg swelling.  Musculoskeletal: Negative for back pain and gait problem.  Skin: Positive for rash.  Psychiatric/Behavioral: Negative for dysphoric mood, self-injury,  sleep disturbance and suicidal ideas. The patient is nervous/anxious.   All other systems reviewed and are negative.   Per HPI unless specifically indicated above   Allergies as of 04/08/2017   No Known Allergies     Medication List        Accurate as of 04/08/17 12:01 PM. Always use your most recent med list.          amLODipine 2.5 MG tablet Commonly known as:  NORVASC Take 1 tablet (2.5 mg total) by mouth daily.   cyclobenzaprine 5 MG tablet Commonly known as:  FLEXERIL Take 1 tablet (5 mg total) by mouth 3 (three) times daily as needed for muscle spasms.   escitalopram 20 MG tablet Commonly known as:  LEXAPRO Take 1 tablet (20 mg total) by mouth daily.   HYDROcodone-acetaminophen 5-325 MG tablet Commonly known as:  NORCO/VICODIN   ibuprofen 800 MG tablet Commonly known as:  ADVIL,MOTRIN   omeprazole 20 MG capsule Commonly known as:  PRILOSEC Take 1 capsule (20 mg total) by mouth 2 (two) times daily before a meal.   penicillin v potassium 500 MG tablet Commonly known as:  VEETID   polyethylene glycol powder powder Commonly known as:  MIRALAX Mix 1 capful in 8 ounces of water and drink daily as needed for constipation.   sodium chloride 0.65 % Soln nasal  spray Commonly known as:  OCEAN Place 2 sprays as needed into both nostrils for congestion.   tetrahydrozoline 0.05 % ophthalmic solution Place 1 drop daily as needed into both eyes (itching).   triamcinolone cream 0.1 % Commonly known as:  KENALOG Apply 1 application topically 2 (two) times daily.          Objective:    BP (!) 144/81   Pulse 60   Temp (!) 96.6 F (35.9 C) (Oral)   Ht 6' (1.829 m)   Wt 202 lb (91.6 kg)   BMI 27.40 kg/m   Wt Readings from Last 3 Encounters:  04/08/17 202 lb (91.6 kg)  03/21/17 206 lb (93.4 kg)  03/17/17 206 lb (93.4 kg)    Physical Exam  Constitutional: He is oriented to person, place, and time. He appears well-developed and well-nourished. No distress.    Eyes: Conjunctivae are normal. No scleral icterus.  Musculoskeletal: Normal range of motion. He exhibits no edema.  Neurological: He is alert and oriented to person, place, and time. Coordination normal.  Skin: Skin is warm and dry. Rash noted. Rash is macular (Scaly pink macular rash scattered in splotches areas on his lower legs both anterior posteriorly.  Excoriations noted as well). He is not diaphoretic.  Psychiatric: His behavior is normal. His mood appears anxious. He expresses no suicidal ideation. He expresses no suicidal plans.  Nursing note and vitals reviewed.       Assessment & Plan:   Problem List Items Addressed This Visit      Other   Generalized anxiety disorder - Primary   Relevant Medications   escitalopram (LEXAPRO) 20 MG tablet    Other Visit Diagnoses    Other eczema       Relevant Medications   triamcinolone cream (KENALOG) 0.1 %       Follow up plan: Return in about 3 months (around 07/09/2017), or if symptoms worsen or fail to improve, for Anxiety recheck.  Counseling provided for all of the vaccine components No orders of the defined types were placed in this encounter.   Caryl Pina, MD [facility] 04/08/2017, 12:01 PM

## 2017-04-21 ENCOUNTER — Ambulatory Visit (INDEPENDENT_AMBULATORY_CARE_PROVIDER_SITE_OTHER): Payer: Medicare Other | Admitting: Orthopaedic Surgery

## 2017-05-25 ENCOUNTER — Observation Stay (HOSPITAL_BASED_OUTPATIENT_CLINIC_OR_DEPARTMENT_OTHER): Payer: Medicare Other

## 2017-05-25 ENCOUNTER — Emergency Department (HOSPITAL_COMMUNITY): Payer: Medicare Other

## 2017-05-25 ENCOUNTER — Encounter (HOSPITAL_COMMUNITY): Payer: Self-pay | Admitting: Physician Assistant

## 2017-05-25 ENCOUNTER — Observation Stay (HOSPITAL_COMMUNITY)
Admission: EM | Admit: 2017-05-25 | Discharge: 2017-05-26 | Disposition: A | Discharge status: Home / Self Care | Payer: Medicare Other | Attending: Emergency Medicine | Admitting: Emergency Medicine

## 2017-05-25 ENCOUNTER — Observation Stay (HOSPITAL_COMMUNITY): Payer: Medicare Other

## 2017-05-25 ENCOUNTER — Ambulatory Visit: Payer: Self-pay | Admitting: Pediatrics

## 2017-05-25 ENCOUNTER — Other Ambulatory Visit: Payer: Self-pay | Admitting: Family Medicine

## 2017-05-25 DIAGNOSIS — J449 Chronic obstructive pulmonary disease, unspecified: Secondary | ICD-10-CM | POA: Diagnosis present

## 2017-05-25 DIAGNOSIS — Z87891 Personal history of nicotine dependence: Secondary | ICD-10-CM | POA: Diagnosis not present

## 2017-05-25 DIAGNOSIS — R41841 Cognitive communication deficit: Secondary | ICD-10-CM | POA: Insufficient documentation

## 2017-05-25 DIAGNOSIS — R4781 Slurred speech: Secondary | ICD-10-CM | POA: Diagnosis not present

## 2017-05-25 DIAGNOSIS — G8929 Other chronic pain: Secondary | ICD-10-CM | POA: Diagnosis present

## 2017-05-25 DIAGNOSIS — R29818 Other symptoms and signs involving the nervous system: Secondary | ICD-10-CM | POA: Diagnosis not present

## 2017-05-25 DIAGNOSIS — D500 Iron deficiency anemia secondary to blood loss (chronic): Secondary | ICD-10-CM | POA: Diagnosis present

## 2017-05-25 DIAGNOSIS — I340 Nonrheumatic mitral (valve) insufficiency: Secondary | ICD-10-CM | POA: Diagnosis not present

## 2017-05-25 DIAGNOSIS — I10 Essential (primary) hypertension: Secondary | ICD-10-CM | POA: Diagnosis not present

## 2017-05-25 DIAGNOSIS — M549 Dorsalgia, unspecified: Secondary | ICD-10-CM

## 2017-05-25 DIAGNOSIS — I639 Cerebral infarction, unspecified: Secondary | ICD-10-CM | POA: Diagnosis not present

## 2017-05-25 DIAGNOSIS — K219 Gastro-esophageal reflux disease without esophagitis: Secondary | ICD-10-CM

## 2017-05-25 DIAGNOSIS — R531 Weakness: Secondary | ICD-10-CM | POA: Diagnosis not present

## 2017-05-25 DIAGNOSIS — Z85048 Personal history of other malignant neoplasm of rectum, rectosigmoid junction, and anus: Secondary | ICD-10-CM | POA: Diagnosis present

## 2017-05-25 DIAGNOSIS — M5136 Other intervertebral disc degeneration, lumbar region: Secondary | ICD-10-CM | POA: Diagnosis present

## 2017-05-25 DIAGNOSIS — R4182 Altered mental status, unspecified: Secondary | ICD-10-CM | POA: Diagnosis present

## 2017-05-25 DIAGNOSIS — F411 Generalized anxiety disorder: Secondary | ICD-10-CM | POA: Diagnosis present

## 2017-05-25 DIAGNOSIS — R410 Disorientation, unspecified: Secondary | ICD-10-CM | POA: Diagnosis not present

## 2017-05-25 LAB — ECHOCARDIOGRAM COMPLETE
HEIGHTINCHES: 72 in
Weight: 3269.86 oz

## 2017-05-25 LAB — COMPREHENSIVE METABOLIC PANEL
ALT: 12 U/L — ABNORMAL LOW (ref 17–63)
ANION GAP: 8 (ref 5–15)
AST: 19 U/L (ref 15–41)
Albumin: 3.5 g/dL (ref 3.5–5.0)
Alkaline Phosphatase: 72 U/L (ref 38–126)
BILIRUBIN TOTAL: 0.4 mg/dL (ref 0.3–1.2)
BUN: 12 mg/dL (ref 6–20)
CO2: 25 mmol/L (ref 22–32)
Calcium: 9 mg/dL (ref 8.9–10.3)
Chloride: 109 mmol/L (ref 101–111)
Creatinine, Ser: 0.98 mg/dL (ref 0.61–1.24)
Glucose, Bld: 87 mg/dL (ref 65–99)
POTASSIUM: 3.5 mmol/L (ref 3.5–5.1)
Sodium: 142 mmol/L (ref 135–145)
TOTAL PROTEIN: 6.8 g/dL (ref 6.5–8.1)

## 2017-05-25 LAB — DIFFERENTIAL
Abs Immature Granulocytes: 0.1 10*3/uL (ref 0.0–0.1)
Basophils Absolute: 0.1 10*3/uL (ref 0.0–0.1)
Basophils Relative: 1 %
EOS ABS: 0.1 10*3/uL (ref 0.0–0.7)
EOS PCT: 1 %
IMMATURE GRANULOCYTES: 1 %
LYMPHS ABS: 1.9 10*3/uL (ref 0.7–4.0)
Lymphocytes Relative: 24 %
Monocytes Absolute: 0.8 10*3/uL (ref 0.1–1.0)
Monocytes Relative: 10 %
Neutro Abs: 5 10*3/uL (ref 1.7–7.7)
Neutrophils Relative %: 63 %

## 2017-05-25 LAB — I-STAT CHEM 8, ED
BUN: 13 mg/dL (ref 6–20)
CALCIUM ION: 1.19 mmol/L (ref 1.15–1.40)
Chloride: 105 mmol/L (ref 101–111)
Creatinine, Ser: 0.9 mg/dL (ref 0.61–1.24)
Glucose, Bld: 90 mg/dL (ref 65–99)
HCT: 39 % (ref 39.0–52.0)
HEMOGLOBIN: 13.3 g/dL (ref 13.0–17.0)
Potassium: 3.5 mmol/L (ref 3.5–5.1)
SODIUM: 142 mmol/L (ref 135–145)
TCO2: 24 mmol/L (ref 22–32)

## 2017-05-25 LAB — CBC
HEMATOCRIT: 40.5 % (ref 39.0–52.0)
HEMOGLOBIN: 12.7 g/dL — AB (ref 13.0–17.0)
MCH: 25.6 pg — ABNORMAL LOW (ref 26.0–34.0)
MCHC: 31.4 g/dL (ref 30.0–36.0)
MCV: 81.5 fL (ref 78.0–100.0)
PLATELETS: 234 10*3/uL (ref 150–400)
RBC: 4.97 MIL/uL (ref 4.22–5.81)
RDW: 14.5 % (ref 11.5–15.5)
WBC: 8 10*3/uL (ref 4.0–10.5)

## 2017-05-25 LAB — HEMOGLOBIN A1C
HEMOGLOBIN A1C: 5.6 % (ref 4.8–5.6)
MEAN PLASMA GLUCOSE: 114.02 mg/dL

## 2017-05-25 LAB — RAPID URINE DRUG SCREEN, HOSP PERFORMED
Amphetamines: NOT DETECTED
Barbiturates: NOT DETECTED
Benzodiazepines: NOT DETECTED
COCAINE: NOT DETECTED
OPIATES: NOT DETECTED
Tetrahydrocannabinol: NOT DETECTED

## 2017-05-25 LAB — PROTIME-INR
INR: 1
Prothrombin Time: 13.1 seconds (ref 11.4–15.2)

## 2017-05-25 LAB — I-STAT TROPONIN, ED: TROPONIN I, POC: 0.01 ng/mL (ref 0.00–0.08)

## 2017-05-25 LAB — APTT: aPTT: 27 seconds (ref 24–36)

## 2017-05-25 LAB — CBG MONITORING, ED: GLUCOSE-CAPILLARY: 85 mg/dL (ref 65–99)

## 2017-05-25 MED ORDER — ASPIRIN 325 MG PO TABS
325.0000 mg | ORAL_TABLET | Freq: Every day | ORAL | Status: DC
  Administered 2017-05-25 – 2017-05-26 (×2): 325 mg via ORAL
  Filled 2017-05-25 (×2): qty 1

## 2017-05-25 MED ORDER — PANTOPRAZOLE SODIUM 40 MG PO TBEC
40.0000 mg | DELAYED_RELEASE_TABLET | Freq: Every day | ORAL | Status: DC
  Administered 2017-05-26: 40 mg via ORAL
  Filled 2017-05-25: qty 1

## 2017-05-25 MED ORDER — ESCITALOPRAM OXALATE 10 MG PO TABS: 20.0000 mg | ORAL_TABLET | Freq: Every day | ORAL | Status: DC

## 2017-05-25 MED ORDER — SODIUM CHLORIDE 0.9 % IV SOLN: INTRAVENOUS | Status: DC

## 2017-05-25 MED ORDER — STROKE: EARLY STAGES OF RECOVERY BOOK
Freq: Once | Status: DC
  Filled 2017-05-25: qty 1

## 2017-05-25 MED ORDER — HEPARIN SODIUM (PORCINE) 5000 UNIT/ML IJ SOLN
5000.0000 [IU] | Freq: Three times a day (TID) | INTRAMUSCULAR | Status: DC
  Administered 2017-05-25 – 2017-05-26 (×2): 5000 [IU] via SUBCUTANEOUS
  Filled 2017-05-25 (×3): qty 1

## 2017-05-25 MED ORDER — GADOBENATE DIMEGLUMINE 529 MG/ML IV SOLN
20.0000 mL | Freq: Once | INTRAVENOUS | Status: AC | PRN
  Administered 2017-05-25: 20 mL via INTRAVENOUS

## 2017-05-25 MED ORDER — SODIUM CHLORIDE 0.9 % IV SOLN
INTRAVENOUS | Status: AC
  Administered 2017-05-25: 20:00:00 via INTRAVENOUS

## 2017-05-25 MED ORDER — ACETAMINOPHEN 325 MG PO TABS: 650.0000 mg | ORAL_TABLET | ORAL | Status: DC | PRN

## 2017-05-25 MED ORDER — HYDRALAZINE HCL 20 MG/ML IJ SOLN: 5.0000 mg | Freq: Three times a day (TID) | INTRAMUSCULAR | Status: DC | PRN

## 2017-05-25 MED ORDER — ASPIRIN 300 MG RE SUPP: 300.0000 mg | Freq: Every day | RECTAL | Status: DC

## 2017-05-25 MED ORDER — ACETAMINOPHEN 650 MG RE SUPP: 650.0000 mg | RECTAL | Status: DC | PRN

## 2017-05-25 MED ORDER — ATORVASTATIN CALCIUM 80 MG PO TABS
80.0000 mg | ORAL_TABLET | Freq: Every day | ORAL | Status: DC
  Administered 2017-05-25: 80 mg via ORAL
  Filled 2017-05-25 (×2): qty 1

## 2017-05-25 MED ORDER — ACETAMINOPHEN 160 MG/5ML PO SOLN: 650.0000 mg | ORAL | Status: DC | PRN

## 2017-05-25 MED ORDER — SALINE SPRAY 0.65 % NA SOLN: 2.0000 | NASAL | Status: DC | PRN

## 2017-05-25 NOTE — ED Notes (Signed)
Pt transported to MRI, will be brought to 5C-09 after MRI. RN Ronalee Belts aware

## 2017-05-25 NOTE — ED Notes (Signed)
Heart healthy meal tray ordered for pt at this time 

## 2017-05-25 NOTE — ED Provider Notes (Signed)
[facility] EMERGENCY DEPARTMENT Provider Note   CSN: [id number] Arrival date & time: 05/25/17  1429     History   Chief Complaint Chief Complaint  Patient presents with  . Code Stroke    HPI Todd Berg is a 67 y.o. male.  HPI 67 year old male who was last seen well at 10 AM this morning.  Developed acute confusion and some weakness of the right side.  Family reports some confusion and slurred speech.  Brought to the emergency department as a code stroke.  At this time he denies headache.  Denies chest pain.  He reports he was out cutting trees.  He denies head injury.  No history of seizures.  Denies alcohol use.  States he is having difficulty thinking clearly.  Patient with a history of COPD prior rectal cancer and hypertension.    Past Medical History:  Diagnosis Date  . Cancer ([place]) 2015   rectal  . Chronic back pain   . COPD (chronic obstructive pulmonary disease) ([place])   . GERD (gastroesophageal reflux disease)   . Hematemesis   . Hemorrhoids   . Hypotension   . Melena     Patient Active Problem List   Diagnosis Date Noted  . Stroke (cerebrum) ([place]) 05/25/2017  . Hypertension 03/21/2017  . Generalized anxiety disorder 03/21/2017  . Degenerative disc disease, lumbar 03/17/2017  . Iron deficiency anemia due to chronic blood loss 11/20/2016  . Chronic back pain   . COPD (chronic obstructive pulmonary disease) ([place])   . GERD (gastroesophageal reflux disease)   . Hemorrhoids   . History of rectal cancer 09/13/2015    Past Surgical History:  Procedure Laterality Date  . BACK SURGERY    . ESOPHAGOGASTRODUODENOSCOPY N/A 06/09/2016   Procedure: ESOPHAGOGASTRODUODENOSCOPY (EGD);  Surgeon: Wilford Corner, MD;  Location: [facility];  Service: Endoscopy;  Laterality: N/A;  . ESOPHAGOGASTRODUODENOSCOPY (EGD) WITH PROPOFOL Left 11/21/2016   Procedure: ESOPHAGOGASTRODUODENOSCOPY (EGD) WITH PROPOFOL;  Surgeon: Arta Silence, MD;  Location:  [facility] ENDOSCOPY;  Service: Endoscopy;  Laterality: Left;  . HEMORRHOID SURGERY    . HERNIA REPAIR    . RECTAL SURGERY          Home Medications    Prior to Admission medications   Medication Sig Start Date End Date Taking? Authorizing Provider  amLODipine (NORVASC) 2.5 MG tablet TAKE 1 TABLET DAILY 05/25/17   Dettinger, [name], MD  cyclobenzaprine (FLEXERIL) 5 MG tablet Take 1 tablet (5 mg total) by mouth 3 (three) times daily as needed for muscle spasms. 01/06/17   Evelina Dun A, FNP  escitalopram (LEXAPRO) 20 MG tablet Take 1 tablet (20 mg total) by mouth daily. 04/08/17   Dettinger, [name], MD  HYDROcodone-acetaminophen (NORCO/VICODIN) 5-325 MG tablet  04/06/17   [provider]  ibuprofen (ADVIL,MOTRIN) 800 MG tablet  04/06/17   [provider]  omeprazole (PRILOSEC) 20 MG capsule Take 1 capsule (20 mg total) by mouth 2 (two) times daily before a meal. 02/09/17   Dettinger, [name], MD  penicillin v potassium (VEETID) 500 MG tablet  03/14/17   [provider]  polyethylene glycol powder (MIRALAX) powder Mix 1 capful in 8 ounces of water and drink daily as needed for constipation. 01/15/17   Janora Norlander, DO  sodium chloride (OCEAN) 0.65 % SOLN nasal spray Place 2 sprays as needed into both nostrils for congestion.    [provider]  tetrahydrozoline 0.05 % ophthalmic solution Place 1 drop daily as needed  into both eyes (itching).    [provider]  triamcinolone cream (KENALOG) 0.1 % Apply 1 application topically 2 (two) times daily. 04/08/17   Dettinger, [name], MD    Family History Family History  Problem Relation Age of Onset  . COPD Mother   . Stroke Father     Social History Social History   Tobacco Use  . Smoking status: Former Smoker    Start date: 12/30/1964    Last attempt to quit: 06/19/2009    Years since quitting: 7.9  . Smokeless tobacco: Former Systems developer    Types: Snuff    Quit date: 09/26/2005  Substance Use  Topics  . Alcohol use: No  . Drug use: No     Allergies   Patient has no known allergies.   Review of Systems Review of Systems  All other systems reviewed and are negative.    Physical Exam Updated Vital Signs Ht 6' (1.829 m)   Wt 92.7 kg (204 lb 5.9 oz)   BMI 27.72 kg/m   Physical Exam  Constitutional: He is oriented to person, place, and time. He appears well-developed and well-nourished.  HENT:  Head: Normocephalic and atraumatic.  Eyes: Pupils are equal, round, and reactive to light. EOM are normal.  Neck: Normal range of motion.  Cardiovascular: Normal rate, regular rhythm, normal heart sounds and intact distal pulses.  Pulmonary/Chest: Effort normal and breath sounds normal. No respiratory distress.  Abdominal: Soft. He exhibits no distension. There is no tenderness.  Musculoskeletal: Normal range of motion.  Neurological: He is alert and oriented to person, place, and time.  5/5 strength in major muscle groups of  bilateral upper and lower extremities. Speech normal. No facial asymetry.   Skin: Skin is warm and dry.  Psychiatric: He has a normal mood and affect. Judgment normal.  Nursing note and vitals reviewed.    ED Treatments / Results  Labs (all labs ordered are listed, but only abnormal results are displayed) Labs Reviewed  CBC - Abnormal; Notable for the following components:      Result Value   Hemoglobin 12.7 (*)    MCH 25.6 (*)    All other components within normal limits  PROTIME-INR  APTT  DIFFERENTIAL  COMPREHENSIVE METABOLIC PANEL  I-STAT TROPONIN, ED  CBG MONITORING, ED  I-STAT CHEM 8, ED    EKG EKG Interpretation  Date/Time:  Wednesday May 25 2017 14:49:57 EDT Ventricular Rate:  61 PR Interval:    QRS Duration: 99 QT Interval:  375 QTC Calculation: 378 R Axis:   36 Text Interpretation:  Sinus rhythm No significant change was found Confirmed by Jola Schmidt [phone]) on 05/25/2017 3:33:05 PM   Radiology Ct Head Code Stroke  Wo Contrast  Result Date: 05/25/2017 CLINICAL DATA:  Code stroke. Confusion and slurred speech. Right-sided deficits. EXAM: CT HEAD WITHOUT CONTRAST TECHNIQUE: Contiguous axial images were obtained from the base of the skull through the vertex without intravenous contrast. COMPARISON:  10/23/2014 FINDINGS: Brain: No evidence of acute infarction, hemorrhage, hydrocephalus, extra-axial collection or mass lesion/mass effect. Vascular: No hyperdense vessel or unexpected calcification. Skull: Normal. Negative for fracture or focal lesion. Sinuses/Orbits: No acute finding. Other: These results were communicated to Dr. Rory Percy at 2:45 pmon 5/15/2019by text page via the [facility] messaging system. ASPECTS [facility] Stroke Program Early CT Score) - Ganglionic level infarction (caudate, lentiform nuclei, internal capsule, insula, M1-M3 cortex): 7 - Supraganglionic infarction (M4-M6 cortex): 3 Total score (0-10 with 10 being normal): 10 IMPRESSION: Negative head  CT.  ASPECTS is 10. Electronically Signed   By: Monte Fantasia M.D.   On: 05/25/2017 14:46    Procedures .Critical Care Performed by: Jola Schmidt, MD Authorized by: Jola Schmidt, MD     CRITICAL CARE Performed by: Jola Schmidt Total critical care time: 32 minutes Critical care time was exclusive of separately billable procedures and treating other patients. Critical care was necessary to treat or prevent imminent or life-threatening deterioration. Critical care was time spent personally by me on the following activities: development of treatment plan with patient and/or surrogate as well as nursing, discussions with consultants, evaluation of patient's response to treatment, examination of patient, obtaining history from patient or surrogate, ordering and performing treatments and interventions, ordering and review of laboratory studies, ordering and review of radiographic studies, pulse oximetry and re-evaluation of patient's condition.   Medications  Ordered in ED Medications - No data to display   Initial Impression / Assessment and Plan / ED Course  I have reviewed the triage vital signs and the nursing notes.  Pertinent labs & imaging results that were available during my care of the patient were reviewed by me and considered in my medical decision making (see chart for details).     Presents as acute stroke.  No evidence to suggest large vessel occlusion at this time as he appears to be moving all 4 extremities.  Patient is stat head CT.  Neurology at the bedside as well.  Protecting his airway  Stat CT demonstrates no acute intracranial abnormality.  Patient will go for stat MRI at this time to evaluate for stroke.  No indication for TPA.  No signs to suggest large vessel occlusion.  Patient found to have acute stroke on MRI.  Patient will be admitted to the hospitalist service.  Please see neurology consultation note for complete details.  Patient protecting his airway.  Stroke swallow study to be performed.  Admit to the hospital for ongoing work-up.  Final Clinical Impressions(s) / ED Diagnoses   Final diagnoses:  Ischemic stroke [facility])    ED Discharge Orders    None       Jola Schmidt, MD 05/25/17 1536

## 2017-05-25 NOTE — ED Triage Notes (Signed)
Pt arrives to ED from home with complaints of confusion, slurred speech, and right sided weakness. EMS reports pt LKW 1000 per wife. Pt was out cutting trees this morning when he fell and then drove home, getting lost on the way. Code stroke called pta, pt brought to CT scanner 2 and then MRI stat. Pt placed in position of comfort with bed locked and lowered, call bell in reach.

## 2017-05-25 NOTE — Progress Notes (Signed)
  Echocardiogram 2D Echocardiogram has been performed.  Todd Berg 05/25/2017, 5:15 PM

## 2017-05-25 NOTE — H&P (Signed)
History and Physical    Todd Berg:[id number] DOB: 1950/07/13 DOA: 05/25/2017   PCP: Dettinger, [name], MD   Patient coming from:  Home    Chief Complaint: Aphasia, dysarthria  HPI: Todd Berg is a 67 y.o. male with medical history significant for rectal cancer 2015, chronic back pain, hypertension, COPD, GERD, causing his usual state of health until this morning around 10 AM, when his wife noticed him having difficulty walking while he was cutting trees, having a few falls.  Patient drove himself back home, at which time, his wife noted that his speech was "off", appearing confused.  EMS brought the patient to the ED.  On arrival, the patient was dysarthric, with some right-sided deficits, right and lower extremity weakness.  Code stroke was initiated.  Of note, the time of arrival, he was outside of the 4-1/2-hour window for IV TPA.  On initial presentation, his NIH stroke scale was 2 in view of dysarthria, and 1 for right leg drift according to neuro notes.  He denies any headache, visual symptoms, recent sicknesses, hospitalizations, he denies any chest pain or shortness of breath.  He denies any nausea or vomiting.  He denies any abdominal pain.  He denies any lower extremity swelling.  He denies any history of cardiac disease.  He denies any prior history of stroke.  No recent long distance trips.  Patient does not take an aspirin a day.  He denies any tobacco, alcohol or recreational drug use.   ED Course:  BP (!) 166/88   Pulse 62   Resp 19   Ht 6' (1.829 m)   Wt 92.7 kg (204 lb 5.9 oz)   SpO2 96%   BMI 27.72 kg/m   CT of the head was negative MRI showed 8 mm acute left thalamic lacunar infarct.  Because "he had no cortical signs on exam, CT of the head and neck was not performed at the time of arrival ".  However, he was seen by neurology, who recommends MRI of the head and neck to further evaluate vasculature .  Small vessel disease is suspected Chemistries are  normal.  Troponin 0 0.01.  Hemoglobin 13.3.  Glucose 90. EKG sinus rhythm, no acute findings  Review of Systems:  As per HPI otherwise all other systems reviewed and are negative  Past Medical History:  Diagnosis Date  . Cancer ([place]) 2015   rectal  . Chronic back pain   . COPD (chronic obstructive pulmonary disease) ([place])   . GERD (gastroesophageal reflux disease)   . Hematemesis   . Hemorrhoids   . Hypotension   . Melena     Past Surgical History:  Procedure Laterality Date  . BACK SURGERY    . ESOPHAGOGASTRODUODENOSCOPY N/A 06/09/2016   Procedure: ESOPHAGOGASTRODUODENOSCOPY (EGD);  Surgeon: Wilford Corner, MD;  Location: [place];  Service: Endoscopy;  Laterality: N/A;  . ESOPHAGOGASTRODUODENOSCOPY (EGD) WITH PROPOFOL Left 11/21/2016   Procedure: ESOPHAGOGASTRODUODENOSCOPY (EGD) WITH PROPOFOL;  Surgeon: Arta Silence, MD;  Location: [facility] ENDOSCOPY;  Service: Endoscopy;  Laterality: Left;  . HEMORRHOID SURGERY    . HERNIA REPAIR    . RECTAL SURGERY      Social History Social History   Socioeconomic History  . Marital status: Married    Spouse name: Not on file  . Number of children: Not on file  . Years of education: Not on file  . Highest education level: Not on file  Occupational History  . Not on file  Social  Needs  . Financial resource strain: Not on file  . Food insecurity:    Worry: Not on file    Inability: Not on file  . Transportation needs:    Medical: Not on file    Non-medical: Not on file  Tobacco Use  . Smoking status: Former Smoker    Start date: 12/30/1964    Last attempt to quit: 06/19/2009    Years since quitting: 7.9  . Smokeless tobacco: Former Systems developer    Types: Snuff    Quit date: 09/26/2005  Substance and Sexual Activity  . Alcohol use: No  . Drug use: No  . Sexual activity: Not on file  Lifestyle  . Physical activity:    Days per week: Not on file    Minutes per session: Not on file  . Stress: Not on file  Relationships  .  Social connections:    Talks on phone: Not on file    Gets together: Not on file    Attends religious service: Not on file    Active member of club or organization: Not on file    Attends meetings of clubs or organizations: Not on file    Relationship status: Not on file  . Intimate partner violence:    Fear of current or ex partner: Not on file    Emotionally abused: Not on file    Physically abused: Not on file    Forced sexual activity: Not on file  Other Topics Concern  . Not on file  Social History Narrative  . Not on file     No Known Allergies  Family History  Problem Relation Age of Onset  . COPD Mother   . Stroke Father       Prior to Admission medications   Medication Sig Start Date End Date Taking? Authorizing Provider  amLODipine (NORVASC) 2.5 MG tablet TAKE 1 TABLET DAILY 05/25/17   Dettinger, [name], MD  cyclobenzaprine (FLEXERIL) 5 MG tablet Take 1 tablet (5 mg total) by mouth 3 (three) times daily as needed for muscle spasms. 01/06/17   Evelina Dun A, FNP  escitalopram (LEXAPRO) 20 MG tablet Take 1 tablet (20 mg total) by mouth daily. 04/08/17   Dettinger, [name], MD  HYDROcodone-acetaminophen (NORCO/VICODIN) 5-325 MG tablet  04/06/17   [provider]  ibuprofen (ADVIL,MOTRIN) 800 MG tablet  04/06/17   [provider]  omeprazole (PRILOSEC) 20 MG capsule Take 1 capsule (20 mg total) by mouth 2 (two) times daily before a meal. 02/09/17   Dettinger, [name], MD  penicillin v potassium (VEETID) 500 MG tablet  03/14/17   [provider]  polyethylene glycol powder (MIRALAX) powder Mix 1 capful in 8 ounces of water and drink daily as needed for constipation. 01/15/17   Janora Norlander, DO  sodium chloride (OCEAN) 0.65 % SOLN nasal spray Place 2 sprays as needed into both nostrils for congestion.    [provider]  tetrahydrozoline 0.05 % ophthalmic solution Place 1 drop daily as needed into both eyes (itching).    [provider]  triamcinolone cream (KENALOG) 0.1 % Apply 1 application topically 2 (two) times daily. 04/08/17   Dettinger, [name], MD    Physical Exam:  Vitals:   05/25/17 1452 05/25/17 1539  BP:  (!) 166/88  Pulse:  62  Resp:  19  SpO2:  96%  Weight: 92.7 kg (204 lb 5.9 oz)   Height: 6' (1.829 m)    Constitutional: NAD, calm,  comfortable   Eyes: PERRL, lids and conjunctivae normal ENMT: Mucous membranes are moist, without exudate or lesions . Edentulous  Neck: normal, supple, no masses, no thyromegaly Respiratory: clear to auscultation bilaterally, no wheezing, no crackles. Normal respiratory effort  Cardiovascular: Regular rate and rhythm, 2/6  murmur, rubs or gallops. No extremity edema. 2+ pedal pulses. No carotid bruits.  Abdomen: Soft, non tender, No hepatosplenomegaly. Bowel sounds positive.  Musculoskeletal: no clubbing / cyanosis. Moves all extremities Skin: no jaundice, No lesions.  Neurologic: Sensation intact  Strength   R upper and Lower extremity 4/5 . Gait not tested but reported ataxic at home . Dysarthric. Attention decreased but does not appear confused  Psychiatric:   Alert and oriented x 3. Normal mood.     Labs on Admission: I have personally reviewed following labs and imaging studies  CBC: Recent Labs  Lab 05/25/17 1439 05/25/17 1443  WBC 8.0  --   NEUTROABS 5.0  --   HGB 12.7* 13.3  HCT 40.5 39.0  MCV 81.5  --   PLT 234  --     Basic Metabolic Panel: Recent Labs  Lab 05/25/17 1439 05/25/17 1443  NA 142 142  K 3.5 3.5  CL 109 105  CO2 25  --   GLUCOSE 87 90  BUN 12 13  CREATININE 0.98 0.90  CALCIUM 9.0  --     GFR: Estimated Creatinine Clearance: 86.2 mL/min (by C-G formula based on SCr of 0.9 mg/dL).  Liver Function Tests: Recent Labs  Lab 05/25/17 1439  AST 19  ALT 12*  ALKPHOS 72  BILITOT 0.4  PROT 6.8  ALBUMIN 3.5   No results for input(s): LIPASE, AMYLASE in the last 168 hours. No results for input(s): AMMONIA in  the last 168 hours.  Coagulation Profile: Recent Labs  Lab 05/25/17 1439  INR 1.00    Cardiac Enzymes: No results for input(s): CKTOTAL, CKMB, CKMBINDEX, TROPONINI in the last 168 hours.  BNP (last 3 results) No results for input(s): PROBNP in the last 8760 hours.  HbA1C: No results for input(s): HGBA1C in the last 72 hours.  CBG: Recent Labs  Lab 05/25/17 1435  GLUCAP 85    Lipid Profile: No results for input(s): CHOL, HDL, LDLCALC, TRIG, CHOLHDL, LDLDIRECT in the last 72 hours.  Thyroid Function Tests: No results for input(s): TSH, T4TOTAL, FREET4, T3FREE, THYROIDAB in the last 72 hours.  Anemia Panel: No results for input(s): VITAMINB12, FOLATE, FERRITIN, TIBC, IRON, RETICCTPCT in the last 72 hours.  Urine analysis:    Component Value Date/Time   COLORURINE YELLOW 02/26/2007 1901   APPEARANCEUR CLEAR 02/26/2007 1901   LABSPEC 1.003 (L) 02/26/2007 1901   PHURINE 6.5 02/26/2007 1901   GLUCOSEU NEGATIVE 02/26/2007 1901   HGBUR NEGATIVE 02/26/2007 1901   BILIRUBINUR NEGATIVE 02/26/2007 1901   KETONESUR NEGATIVE 02/26/2007 1901   PROTEINUR NEGATIVE 02/26/2007 1901   UROBILINOGEN 0.2 02/26/2007 1901   NITRITE NEGATIVE 02/26/2007 1901   LEUKOCYTESUR  02/26/2007 1901    NEGATIVE MICROSCOPIC NOT DONE ON URINES WITH NEGATIVE PROTEIN, BLOOD, LEUKOCYTES, NITRITE, OR GLUCOSE <1000 mg/dL.    Sepsis Labs: @LABRCNTIP (procalcitonin:4,lacticidven:4) )No results found for this or any previous visit (from the past 240 hour(s)).   Radiological Exams on Admission: Mr Angiogram Head Wo Contrast  Result Date: 05/25/2017 CLINICAL DATA:  Acute confusion EXAM: MRI HEAD WITHOUT CONTRAST MRA HEAD WITHOUT CONTRAST TECHNIQUE: Multiplanar, multiecho pulse sequences of the brain and surrounding structures were obtained without intravenous contrast. Angiographic images of the  head were obtained using MRA technique without contrast. COMPARISON:  CT head 05/25/2017 FINDINGS: MRI HEAD  FINDINGS Brain: 8 mm cute infarct left lateral thalamus. No other acute infarct Mild chronic microvascular ischemic changes in the white matter. Negative for hydrocephalus. Negative for hemorrhage or mass. Vascular: Normal arterial flow voids Skull and upper cervical spine: Negative Sinuses/Orbits: Negative Other: None MRA HEAD FINDINGS Right vertebral artery does not contribute to the basilar. PICA patent bilaterally. Left vertebral artery widely patent. Basilar widely patent. Superior cerebellar and posterior cerebral arteries patent bilaterally. Fetal origin posterior cerebral artery bilaterally. Mild stenosis distal right posterior cerebral artery branches. Mild atherosclerotic disease in the cavernous carotid bilaterally. Anterior and middle cerebral arteries are patent bilaterally. Mild stenosis in the anterior cerebral arteries bilaterally A2 segment. Mild stenosis A1 segment bilaterally. Mild stenosis left middle cerebral artery branches. Right middle cerebral artery branches patent. Negative for aneurysm. IMPRESSION: 8 mm acute infarct left lateral thalamus. Mild chronic microvascular ischemia Occluded distal right vertebral artery. Mild intracranial atherosclerotic disease. Electronically Signed   By: Franchot Gallo M.D.   On: 05/25/2017 15:40   Mr Brain Wo Contrast  Result Date: 05/25/2017 CLINICAL DATA:  Acute confusion EXAM: MRI HEAD WITHOUT CONTRAST MRA HEAD WITHOUT CONTRAST TECHNIQUE: Multiplanar, multiecho pulse sequences of the brain and surrounding structures were obtained without intravenous contrast. Angiographic images of the head were obtained using MRA technique without contrast. COMPARISON:  CT head 05/25/2017 FINDINGS: MRI HEAD FINDINGS Brain: 8 mm cute infarct left lateral thalamus. No other acute infarct Mild chronic microvascular ischemic changes in the white matter. Negative for hydrocephalus. Negative for hemorrhage or mass. Vascular: Normal arterial flow voids Skull and upper  cervical spine: Negative Sinuses/Orbits: Negative Other: None MRA HEAD FINDINGS Right vertebral artery does not contribute to the basilar. PICA patent bilaterally. Left vertebral artery widely patent. Basilar widely patent. Superior cerebellar and posterior cerebral arteries patent bilaterally. Fetal origin posterior cerebral artery bilaterally. Mild stenosis distal right posterior cerebral artery branches. Mild atherosclerotic disease in the cavernous carotid bilaterally. Anterior and middle cerebral arteries are patent bilaterally. Mild stenosis in the anterior cerebral arteries bilaterally A2 segment. Mild stenosis A1 segment bilaterally. Mild stenosis left middle cerebral artery branches. Right middle cerebral artery branches patent. Negative for aneurysm. IMPRESSION: 8 mm acute infarct left lateral thalamus. Mild chronic microvascular ischemia Occluded distal right vertebral artery. Mild intracranial atherosclerotic disease. Electronically Signed   By: Franchot Gallo M.D.   On: 05/25/2017 15:40   Ct Head Code Stroke Wo Contrast  Result Date: 05/25/2017 CLINICAL DATA:  Code stroke. Confusion and slurred speech. Right-sided deficits. EXAM: CT HEAD WITHOUT CONTRAST TECHNIQUE: Contiguous axial images were obtained from the base of the skull through the vertex without intravenous contrast. COMPARISON:  10/23/2014 FINDINGS: Brain: No evidence of acute infarction, hemorrhage, hydrocephalus, extra-axial collection or mass lesion/mass effect. Vascular: No hyperdense vessel or unexpected calcification. Skull: Normal. Negative for fracture or focal lesion. Sinuses/Orbits: No acute finding. Other: These results were communicated to Dr. Rory Percy at 2:45 pmon 5/15/2019by text page via the [facility] messaging system. ASPECTS [facility] Stroke Program Early CT Score) - Ganglionic level infarction (caudate, lentiform nuclei, internal capsule, insula, M1-M3 cortex): 7 - Supraganglionic infarction (M4-M6 cortex): 3 Total score (0-10  with 10 being normal): 10 IMPRESSION: Negative head CT.  ASPECTS is 10. Electronically Signed   By: Monte Fantasia M.D.   On: 05/25/2017 14:46    EKG: Independently reviewed. SR no acute changes    Assessment/Plan Active Problems:   History  of rectal cancer   Chronic back pain   COPD (chronic obstructive pulmonary disease) (HCC)   GERD (gastroesophageal reflux disease)   Iron deficiency anemia due to chronic blood loss   Degenerative disc disease, lumbar   Hypertension   Generalized anxiety disorder   Stroke (cerebrum) (H[facility])    Acute-R sided weakness dysarthria, ataxia, and the patient with no diagnosis of acute left thalamic lacunar infarct per MRI of the brain.  No TPA candidate, as the patient is greater than the 4-1/2-hour window.  Work-up is in progress.  Appreciate neurology evaluation.    Admit to Tele obs Stroke order set  MRA head and neck Allow permissive HTN, hydralazine for BP greater than 220, as per neurology recommendations Echo  PT/OT/SLP  lipid panel a.m. A1C Aspirin 325 mg daily Lipitor 10 mg daily Neuro  Following, appreciate their involvement Frequent neuro checks   Hypertension BP  166/88   Pulse 62   Hold home anti-hypertensive medications . Hydralazine for BP > 220 as above mentioned   Anxiety/ Depression  Continue home Lexapro   GERD, no acute symptoms Continue PPI   COPD without exacerbation Osats normal,    WBC  8. No wheezing on exam  Cont O2 prn      DVT prophylaxis:  Hep sq Code Status:    Full  Family Communication:  Discussed with patient and family  Disposition Plan: Expect patient to be discharged to home after condition improves Consults called:    Neuro per EDP, Dr,Arora Admission status: Tele Obs   Sharene Butters, PA-C [facility]   Amion text  [phone]   05/25/2017, 4:24 PM

## 2017-05-25 NOTE — Consult Note (Signed)
Neurology Consultation  Reason for Consult: Code stroke Referring Physician: Dr. Venora Maples  CC: Confusion, speech problem  History is obtained from: Patient, EMS  HPI: Todd Berg is a 67 y.o. male was past medical history of rectal cancer, chronic back pain, HTN, who was in his usual state of health with last known normal per EMS reported by his wife at 56 AM.  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  He drove himself back home and on his arrival home his wife noticed that his speech was off and he appeared confused and speech appeared slurred. EMS was called to evaluate him for this.  They evaluated him and found him dysarthric as well as having right-sided deficits-right upper and lower extremity weakness.  A code stroke was called and he was brought into the emergency room.  He was brought in from [facility] and by the time he arrived at the ER bridge, he was outside of the 4-1/2-hour window for IV TPA. On initial examination his NIH stroke scale was 2 for dysarthria and 1 for right leg drift. Because of being outside the window and low stroke scale, he was not offered IV TPA. He did not have any cortical signs on his exam. He denies any preceding sicknesses or illnesses.  Denies any headache.  Denies visual symptoms.  Denies chest pain shortness of breath nausea vomiting.  LKW: 10 AM on 05/25/2017 tpa given?: no, outside the window Premorbid modified Rankin scale (mRS): 0  ROS: ROS was performed and is negative except as noted in the HPI.    Past Medical History:  Diagnosis Date  . Cancer ([place]) 2015   rectal  . Chronic back pain   . COPD (chronic obstructive pulmonary disease) ([place])   . GERD (gastroesophageal reflux disease)   . Hematemesis   . Hemorrhoids   . Hypotension   . Melena     Family History  Problem Relation Age of Onset  . COPD Mother   . Stroke Father    Social History:   reports that he  quit smoking about 7 years ago. He started smoking about 52 years ago. He quit smokeless tobacco use about 11 years ago. His smokeless tobacco use included snuff. He reports that he does not drink alcohol or use drugs.  Medications No current facility-administered medications for this encounter.   Current Outpatient Medications:  .  amLODipine (NORVASC) 2.5 MG tablet, TAKE 1 TABLET DAILY, Disp: 30 tablet, Rfl: 2 .  cyclobenzaprine (FLEXERIL) 5 MG tablet, Take 1 tablet (5 mg total) by mouth 3 (three) times daily as needed for muscle spasms., Disp: 90 tablet, Rfl: 1 .  escitalopram (LEXAPRO) 20 MG tablet, Take 1 tablet (20 mg total) by mouth daily., Disp: 30 tablet, Rfl: 1 .  HYDROcodone-acetaminophen (NORCO/VICODIN) 5-325 MG tablet, , Disp: , Rfl:  .  ibuprofen (ADVIL,MOTRIN) 800 MG tablet, , Disp: , Rfl:  .  omeprazole (PRILOSEC) 20 MG capsule, Take 1 capsule (20 mg total) by mouth 2 (two) times daily before a meal., Disp: 60 capsule, Rfl: 3 .  penicillin v potassium (VEETID) 500 MG tablet, , Disp: , Rfl:  .  polyethylene glycol powder (MIRALAX) powder, Mix 1 capful in 8 ounces of water and drink daily as needed for constipation., Disp: 255 g, Rfl: 0 .  sodium chloride (OCEAN) 0.65 % SOLN nasal spray, Place 2 sprays as needed into both nostrils for congestion.,  Disp: , Rfl:  .  tetrahydrozoline 0.05 % ophthalmic solution, Place 1 drop daily as needed into both eyes (itching)., Disp: , Rfl:  .  triamcinolone cream (KENALOG) 0.1 %, Apply 1 application topically 2 (two) times daily., Disp: 454 g, Rfl: 0   Exam: Current vital signs: Ht 6' (1.829 m)   Wt 92.7 kg (204 lb 5.9 oz)   BMI 27.72 kg/m  Vital signs in last 24 hours: Weight:  [92.7 kg (204 lb 5.9 oz)] 92.7 kg (204 lb 5.9 oz) (05/15 1452)  GENERAL: Awake, alert in NAD HEENT: - Normocephalic and atraumatic, dry mm, no LN++, no Thyromegally, poor oral hygiene LUNGS - Clear to auscultation bilaterally with no wheezes CV - S1S2 RRR, no  m/r/g, equal pulses bilaterally. ABDOMEN - Soft, nontender, nondistended with normoactive BS Ext: warm, well perfused, intact peripheral pulses, no edema  NEURO:  Mental Status: AA&Ox3  Language: speech is markedly dysarthric.  Naming, repetition, fluency, and comprehension intact.  Attention concentration is poor Cranial Nerves: PERRL. EOMI, visual fields full, no facial asymmetry, facial sensation intact, hearing intact, tongue/uvula/soft palate midline, normal sternocleidomastoid and trapezius muscle strength. No evidence of tongue atrophy or fibrillations Motor: Right lower extremity 4/5 with vertical drift.  Right upper extremity 4+/5 without vertical drift.  Left upper and lower extremity 5/5. Tone: is normal and bulk is normal Sensation- Intact to light touch bilaterally Coordination: FTN intact bilaterally Gait- deferred but according to report was very ataxic at home  NIHSS-3   Labs I have reviewed labs in epic and the results pertinent to this consultation are: CBC    Component Value Date/Time   WBC 8.0 05/25/2017 1439   RBC 4.97 05/25/2017 1439   HGB 13.3 05/25/2017 1443   HGB 11.4 (L) 03/17/2017 1050   HCT 39.0 05/25/2017 1443   HCT 36.0 (L) 03/17/2017 1050   PLT 234 05/25/2017 1439   PLT 282 03/17/2017 1050   MCV 81.5 05/25/2017 1439   MCV 78 (L) 03/17/2017 1050   MCH 25.6 (L) 05/25/2017 1439   MCHC 31.4 05/25/2017 1439   RDW 14.5 05/25/2017 1439   RDW 16.2 (H) 03/17/2017 1050   LYMPHSABS 1.9 05/25/2017 1439   LYMPHSABS 1.6 03/17/2017 1050   MONOABS 0.8 05/25/2017 1439   EOSABS 0.1 05/25/2017 1439   EOSABS 0.2 03/17/2017 1050   BASOSABS 0.1 05/25/2017 1439   BASOSABS 0.1 03/17/2017 1050  CMP     Component Value Date/Time   NA 142 05/25/2017 1443   NA 142 03/17/2017 1050   K 3.5 05/25/2017 1443   CL 105 05/25/2017 1443   CO2 26 03/17/2017 1050   GLUCOSE 90 05/25/2017 1443   BUN 13 05/25/2017 1443   BUN 16 03/17/2017 1050   CREATININE 0.90 05/25/2017  1443   CALCIUM 9.5 03/17/2017 1050   PROT 6.8 03/17/2017 1050   ALBUMIN 3.9 03/17/2017 1050   AST 12 03/17/2017 1050   ALT 9 03/17/2017 1050   ALKPHOS 74 03/17/2017 1050   BILITOT 0.3 03/17/2017 1050   GFRNONAA 89 03/17/2017 1050   GFRAA 103 03/17/2017 1050    Imaging I have reviewed the images obtained:  CT-scan of the brain-no acute changes  MRI examination of the brain-acute left thalamic lacunar infarct.  Assessment:  67 year old man with acute onset of speech problems and right-sided weakness, on exam had right hemiparesis and dysarthria without much facial involvement. MRI shows a an acute left thalamic lacunar infarct. He arrived at the ER shortly after the 4-1/2-hour window  which made him ineligible for IV TPA. He had no cortical signs on exam, hence CTA head and neck was not performed.  We will evaluate his head and neck vasculature by an MRA  Impression: Most likely an acute ischemic stroke secondary to small vessel disease.  Recommendations: -Admit to hospitalist -Telemetry monitoring -Allow for permissive hypertension for the first 24-48h - only treat PRN if SBP >220 mmHg. Blood pressures can be gradually normalized to SBP<140 upon discharge. -MRA head without contrast, MRA neck with contrast -Echocardiogram -HgbA1c, fasting lipid panel -Frequent neuro checks -Prophylactic therapy-Antiplatelet med: Aspirin - dose 325mg  PO or 300mg  PR -Atorvastatin 80 mg PO daily -Risk factor modification -I discussed the importance of exercise as well as smoking/alcohol/illicit drug use cessation. -PT consult, OT consult, Speech consult  Please page stroke NP/PA/MD (listed on AMION)  from 8am-4 pm as this patient will be followed by the stroke team at this point.  -- Amie Portland, MD [facility] Pager: [phone] If 7pm to 7am, please call on call as listed on AMION.

## 2017-05-25 NOTE — ED Notes (Signed)
Neurologist paged to update family on results of scans. MD returned call and updated family via phone.

## 2017-05-26 ENCOUNTER — Encounter (HOSPITAL_COMMUNITY): Payer: Self-pay | Admitting: *Deleted

## 2017-05-26 ENCOUNTER — Other Ambulatory Visit: Payer: Self-pay

## 2017-05-26 DIAGNOSIS — I10 Essential (primary) hypertension: Secondary | ICD-10-CM | POA: Diagnosis not present

## 2017-05-26 DIAGNOSIS — I6300 Cerebral infarction due to thrombosis of unspecified precerebral artery: Secondary | ICD-10-CM

## 2017-05-26 LAB — LIPID PANEL
Cholesterol: 177 mg/dL (ref 0–200)
HDL: 38 mg/dL — ABNORMAL LOW (ref >40)
LDL Cholesterol: 121 mg/dL — ABNORMAL HIGH (ref 0–99)
TRIGLYCERIDES: 88 mg/dL (ref <150)
Total CHOL/HDL Ratio: 4.7 RATIO
VLDL: 18 mg/dL (ref 0–40)

## 2017-05-26 MED ORDER — CLOPIDOGREL BISULFATE 75 MG PO TABS
75.0000 mg | ORAL_TABLET | Freq: Every day | ORAL | Status: DC
  Administered 2017-05-26: 75 mg via ORAL
  Filled 2017-05-26: qty 1

## 2017-05-26 MED ORDER — ATORVASTATIN CALCIUM 80 MG PO TABS: 80.0000 mg | ORAL_TABLET | Freq: Every day | ORAL | 0 refills | Status: DC

## 2017-05-26 MED ORDER — ASPIRIN 81 MG PO TBEC: 81.0000 mg | DELAYED_RELEASE_TABLET | Freq: Every day | ORAL | Status: DC

## 2017-05-26 MED ORDER — CLOPIDOGREL BISULFATE 75 MG PO TABS: 75.0000 mg | ORAL_TABLET | Freq: Every day | ORAL | 0 refills | Status: DC

## 2017-05-26 MED ORDER — ASPIRIN EC 81 MG PO TBEC: 81.0000 mg | DELAYED_RELEASE_TABLET | Freq: Every day | ORAL | Status: DC

## 2017-05-26 NOTE — Discharge Summary (Signed)
Physician Discharge Summary  Todd Berg JJK:[id number] DOB: 11-Sep-1950 DOA: 05/25/2017  PCP: Dettinger, [name], MD  Admit date: 05/25/2017 Discharge date: 05/26/2017  Admitted From: home Discharge disposition: home   Recommendations for Outpatient Follow-Up:   1. Plavix/asa together for 3 weeks then ASA ALONE 2. Outpatient SLP follow up   Discharge Diagnosis:   Active Problems:   History of rectal cancer   Chronic back pain   COPD (chronic obstructive pulmonary disease) (HCC)   GERD (gastroesophageal reflux disease)   Iron deficiency anemia due to chronic blood loss   Degenerative disc disease, lumbar   Hypertension   Generalized anxiety disorder   Stroke (cerebrum) ([place])    Discharge Condition: Improved.  Diet recommendation: Low sodium, heart healthy  Wound care: None.  Code status: Full.   History of Present Illness:   Todd Berg is a 67 y.o. male with a Past Medical History of HTN, GERD and chronic back pain was found to have an acute CVA on MRI: 8 mm in left lateral thalamus.  Plan is for stroke work up including: FLP, HgbA1c, MRA of neck, echo, PT/OT.  Swallow eval is still pending in the ER, heart healthy diet when passes.  Neurology consult noted and appreciated.   Hospital Course by Problem:   Stroke:   Tiny L lateral thalamic lacune  infarct in secondary to small vessel disease      MRI, MRA head and neck  Tiny L lateral thalamic lacune. Small vessel disease. Occluded distal R VA  2D Echo  EF 60-65%. No source of embolus   LDL 121- statin started  HgbA1c 5.6   aspirin 81 mg and plavix 75 mg daily x 3 weeks, then aspirin alone. Orders adjusted.  Outpatient SLP eval  Neurology follow up  Hypertension  Stable  Permissive hypertension (OK if < 220/120) but gradually normalize in 5-7 days  Long-term BP goal normotensive  Hyperlipidemia  LDL 121, goal < 70  Now on lipitor 80       Medical Consultants:    Neurology   Discharge Exam:   Vitals:   05/26/17 0752 05/26/17 1200  BP: (!) 147/78 (!) 153/80  Pulse: (!) 56 (!) 56  Resp: 17 18  Temp: 97.7 F (36.5 C) 98.2 F (36.8 C)  SpO2: 98% 100%   Vitals:   05/26/17 0209 05/26/17 0411 05/26/17 0752 05/26/17 1200  BP: (!) 147/86 (!) 155/87 (!) 147/78 (!) 153/80  Pulse: (!) 55 (!) 56 (!) 56 (!) 56  Resp: 16 18 17 18   Temp: 97.7 F (36.5 C) 97.8 F (36.6 C) 97.7 F (36.5 C) 98.2 F (36.8 C)  TempSrc: Oral Oral Oral Oral  SpO2: 99% 98% 98% 100%  Weight:      Height:        General exam: Appears calm and comfortable. -- no worsening of symptoms-- some word finding difficulty   The results of significant diagnostics from this hospitalization (including imaging, microbiology, ancillary and laboratory) are listed below for reference.     Procedures and Diagnostic Studies:   Mr Angiogram Head Wo Contrast  Result Date: 05/25/2017 CLINICAL DATA:  Acute confusion EXAM: MRI HEAD WITHOUT CONTRAST MRA HEAD WITHOUT CONTRAST TECHNIQUE: Multiplanar, multiecho pulse sequences of the brain and surrounding structures were obtained without intravenous contrast. Angiographic images of the head were obtained using MRA technique without contrast. COMPARISON:  CT head 05/25/2017 FINDINGS: MRI HEAD FINDINGS Brain: 8 mm cute infarct left lateral thalamus. No  other acute infarct Mild chronic microvascular ischemic changes in the white matter. Negative for hydrocephalus. Negative for hemorrhage or mass. Vascular: Normal arterial flow voids Skull and upper cervical spine: Negative Sinuses/Orbits: Negative Other: None MRA HEAD FINDINGS Right vertebral artery does not contribute to the basilar. PICA patent bilaterally. Left vertebral artery widely patent. Basilar widely patent. Superior cerebellar and posterior cerebral arteries patent bilaterally. Fetal origin posterior cerebral artery bilaterally. Mild stenosis distal right posterior cerebral artery branches.  Mild atherosclerotic disease in the cavernous carotid bilaterally. Anterior and middle cerebral arteries are patent bilaterally. Mild stenosis in the anterior cerebral arteries bilaterally A2 segment. Mild stenosis A1 segment bilaterally. Mild stenosis left middle cerebral artery branches. Right middle cerebral artery branches patent. Negative for aneurysm. IMPRESSION: 8 mm acute infarct left lateral thalamus. Mild chronic microvascular ischemia Occluded distal right vertebral artery. Mild intracranial atherosclerotic disease. Electronically Signed   By: Franchot Gallo M.D.   On: 05/25/2017 15:40   Mr Jodene Nam Neck W Wo Contrast  Result Date: 05/25/2017 CLINICAL DATA:  Follow-up examination for acute stroke. EXAM: MRA NECK WITHOUT AND WITH CONTRAST TECHNIQUE: Multiplanar and multiecho pulse sequences of the neck were obtained without and with intravenous contrast. Angiographic images of the neck were obtained using MRA technique without and with intravenous contrast. CONTRAST:  61mL MULTIHANCE GADOBENATE DIMEGLUMINE 529 MG/ML IV SOLN COMPARISON:  Prior MRI and MRA of the head from earlier the same day. FINDINGS: Source images reviewed. Partially visualized aortic arch of normal caliber, although the origin of the great vessels are incompletely evaluated on this exam. Visualized subclavian arteries demonstrate mild atherosclerotic change without flow-limiting stenosis. Right common carotid artery patent from its origin to the bifurcation without stenosis. Mild atheromatous irregularity about the right bifurcation without hemodynamically significant stenosis. Right ICA widely patent distally to the skull base without stenosis or occlusion. Visualized left common carotid artery patent from to the bifurcation without flow-limiting stenosis. Atheromatous irregularity about the left bifurcation without hemodynamically significant stenosis. Left ICA widely patent distally to the skull base without stenosis or vascular  occlusion. Both of the vertebral arteries arise from the subclavian arteries. Left vertebral artery dominant, with a diffusely hypoplastic right vertebral artery. Visualized portions of the vertebral arteries patent within the neck without hemodynamically significant stenosis or vascular occlusion. Hypoplastic right vertebral artery terminates in PICA. IMPRESSION: 1. Mild atheromatous irregularity about the carotid bifurcations bilaterally without hemodynamically significant stenosis. 2. Patent vertebral arteries within the neck without significant stenosis. Left vertebral artery dominant. Hypoplastic right vertebral artery terminates in PICA. Electronically Signed   By: Jeannine Boga M.D.   On: 05/25/2017 20:43   Mr Brain Wo Contrast  Result Date: 05/25/2017 CLINICAL DATA:  Acute confusion EXAM: MRI HEAD WITHOUT CONTRAST MRA HEAD WITHOUT CONTRAST TECHNIQUE: Multiplanar, multiecho pulse sequences of the brain and surrounding structures were obtained without intravenous contrast. Angiographic images of the head were obtained using MRA technique without contrast. COMPARISON:  CT head 05/25/2017 FINDINGS: MRI HEAD FINDINGS Brain: 8 mm cute infarct left lateral thalamus. No other acute infarct Mild chronic microvascular ischemic changes in the white matter. Negative for hydrocephalus. Negative for hemorrhage or mass. Vascular: Normal arterial flow voids Skull and upper cervical spine: Negative Sinuses/Orbits: Negative Other: None MRA HEAD FINDINGS Right vertebral artery does not contribute to the basilar. PICA patent bilaterally. Left vertebral artery widely patent. Basilar widely patent. Superior cerebellar and posterior cerebral arteries patent bilaterally. Fetal origin posterior cerebral artery bilaterally. Mild stenosis distal right posterior cerebral artery branches. Mild atherosclerotic  disease in the cavernous carotid bilaterally. Anterior and middle cerebral arteries are patent bilaterally. Mild  stenosis in the anterior cerebral arteries bilaterally A2 segment. Mild stenosis A1 segment bilaterally. Mild stenosis left middle cerebral artery branches. Right middle cerebral artery branches patent. Negative for aneurysm. IMPRESSION: 8 mm acute infarct left lateral thalamus. Mild chronic microvascular ischemia Occluded distal right vertebral artery. Mild intracranial atherosclerotic disease. Electronically Signed   By: Franchot Gallo M.D.   On: 05/25/2017 15:40   Ct Head Code Stroke Wo Contrast  Result Date: 05/25/2017 CLINICAL DATA:  Code stroke. Confusion and slurred speech. Right-sided deficits. EXAM: CT HEAD WITHOUT CONTRAST TECHNIQUE: Contiguous axial images were obtained from the base of the skull through the vertex without intravenous contrast. COMPARISON:  10/23/2014 FINDINGS: Brain: No evidence of acute infarction, hemorrhage, hydrocephalus, extra-axial collection or mass lesion/mass effect. Vascular: No hyperdense vessel or unexpected calcification. Skull: Normal. Negative for fracture or focal lesion. Sinuses/Orbits: No acute finding. Other: These results were communicated to Dr. Rory Percy at 2:45 pmon 5/15/2019by text page via the [facility] messaging system. ASPECTS [facility] Stroke Program Early CT Score) - Ganglionic level infarction (caudate, lentiform nuclei, internal capsule, insula, M1-M3 cortex): 7 - Supraganglionic infarction (M4-M6 cortex): 3 Total score (0-10 with 10 being normal): 10 IMPRESSION: Negative head CT.  ASPECTS is 10. Electronically Signed   By: Monte Fantasia M.D.   On: 05/25/2017 14:46     Labs:   Basic Metabolic Panel: Recent Labs  Lab 05/25/17 1439 05/25/17 1443  NA 142 142  K 3.5 3.5  CL 109 105  CO2 25  --   GLUCOSE 87 90  BUN 12 13  CREATININE 0.98 0.90  CALCIUM 9.0  --    GFR Estimated Creatinine Clearance: 86.2 mL/min (by C-G formula based on SCr of 0.9 mg/dL). Liver Function Tests: Recent Labs  Lab 05/25/17 1439  AST 19  ALT 12*  ALKPHOS 72    BILITOT 0.4  PROT 6.8  ALBUMIN 3.5   No results for input(s): LIPASE, AMYLASE in the last 168 hours. No results for input(s): AMMONIA in the last 168 hours. Coagulation profile Recent Labs  Lab 05/25/17 1439  INR 1.00    CBC: Recent Labs  Lab 05/25/17 1439 05/25/17 1443  WBC 8.0  --   NEUTROABS 5.0  --   HGB 12.7* 13.3  HCT 40.5 39.0  MCV 81.5  --   PLT 234  --    Cardiac Enzymes: No results for input(s): CKTOTAL, CKMB, CKMBINDEX, TROPONINI in the last 168 hours. BNP: Invalid input(s): POCBNP CBG: Recent Labs  Lab 05/25/17 1435  GLUCAP 85   D-Dimer No results for input(s): DDIMER in the last 72 hours. Hgb A1c Recent Labs    05/25/17 1707  HGBA1C 5.6   Lipid Profile Recent Labs    05/26/17 0351  CHOL 177  HDL 38*  LDLCALC 121*  TRIG 88  CHOLHDL 4.7   Thyroid function studies No results for input(s): TSH, T4TOTAL, T3FREE, THYROIDAB in the last 72 hours.  Invalid input(s): FREET3 Anemia work up No results for input(s): VITAMINB12, FOLATE, FERRITIN, TIBC, IRON, RETICCTPCT in the last 72 hours. Microbiology No results found for this or any previous visit (from the past 240 hour(s)).   Discharge Instructions:   Discharge Instructions    Ambulatory referral to Neurology   Complete by:  As directed    An appointment is requested in approximately: 4-6 weeks   Diet - low sodium heart healthy   Complete by:  As directed    Discharge instructions   Complete by:  As directed    No driving until follow up with neurology Both ASA/plavix for 3 weeks then ASA alone Statin for high cholesterol-- goal is an LDL <70   Increase activity slowly   Complete by:  As directed      Allergies as of 05/26/2017   No Known Allergies     Medication List    STOP taking these medications   cyclobenzaprine 5 MG tablet Commonly known as:  FLEXERIL   escitalopram 20 MG tablet Commonly known as:  LEXAPRO   HYDROcodone-acetaminophen 5-325 MG tablet Commonly  known as:  NORCO/VICODIN   ibuprofen 800 MG tablet Commonly known as:  ADVIL,MOTRIN   penicillin v potassium 500 MG tablet Commonly known as:  VEETID     TAKE these medications   amLODipine 2.5 MG tablet Commonly known as:  NORVASC TAKE 1 TABLET DAILY   aspirin 81 MG EC tablet Take 1 tablet (81 mg total) by mouth daily. Start taking on:  05/27/2017   atorvastatin 80 MG tablet Commonly known as:  LIPITOR Take 1 tablet (80 mg total) by mouth daily at 6 PM.   clopidogrel 75 MG tablet Commonly known as:  PLAVIX Take 1 tablet (75 mg total) by mouth daily.   omeprazole 20 MG capsule Commonly known as:  PRILOSEC Take 1 capsule (20 mg total) by mouth 2 (two) times daily before a meal.   pantoprazole 40 MG tablet Commonly known as:  PROTONIX Take 40 mg by mouth daily.   polyethylene glycol powder powder Commonly known as:  MIRALAX Mix 1 capful in 8 ounces of water and drink daily as needed for constipation.   sodium chloride 0.65 % Soln nasal spray Commonly known as:  OCEAN Place 2 sprays as needed into both nostrils for congestion.   tetrahydrozoline 0.05 % ophthalmic solution Place 1 drop daily as needed into both eyes (itching).   triamcinolone cream 0.1 % Commonly known as:  KENALOG Apply 1 application topically 2 (two) times daily.      Follow-up Information    Dettinger, [name], MD Follow up in 1 week(s).   Specialties:  Family Medicine, Cardiology Contact information: [place] [id number] [phone]        referral made for neurology follow up Follow up.            Time coordinating discharge: 35 min  Signed:  Geradine Girt  [facility] 05/26/2017, 2:02 PM

## 2017-05-26 NOTE — Evaluation (Signed)
Physical Therapy Evaluation Patient Details Name: Todd Berg MRN: [id number] DOB: September 25, 1950 Today's Date: 05/26/2017   History of Present Illness  Todd Berg is a 67 y.o. male with medical history significant for rectal cancer 2015, chronic back pain, hypertension, COPD, GERD. Came to ED with balance deficits and slurred speech.  On arrival, the patient was dysarthric, with some right-sided deficits, right and lower extremity weakness. MRI revealed infarct left lateral thalamus  Clinical Impression  Pt admitted with above diagnosis. Pt currently with functional limitations due to the deficits listed below (see PT Problem List). Pt very active and independent at baseline, enjoys being outside a lot; pt doing well with PT today, amb without AD x 180+', pt with higher level balance deficits, will continue to follow in acute setting;  Pt will benefit from skilled PT to increase their independence and safety with mobility to allow discharge to the venue listed below.       Follow Up Recommendations No PT follow up    Equipment Recommendations  None recommended by PT    Recommendations for Other Services       Precautions / Restrictions Precautions Precautions: Fall      Mobility  Bed Mobility               General bed mobility comments: pt in chair  Transfers Overall transfer level: Needs assistance Equipment used: None Transfers: Sit to/from Stand Sit to Stand: Supervision         General transfer comment: for safety; sit to stand x2 from recliner, no physical assist  Ambulation/Gait Ambulation/Gait assistance: Min guard;Supervision Ambulation Distance (Feet): 180 Feet Assistive device: None Gait Pattern/deviations: Step-through pattern;Wide base of support;Decreased stride length     General Gait Details: min/guard for safety and negotiation of obstacles in hallway, cues to attend to obstacles on R side  Stairs            Wheelchair Mobility     Modified Rankin (Stroke Patients Only)       Balance Overall balance assessment: Needs assistance             Standing balance comment: fair to good, tolerates min challenges, reactions mildly delayed             High level balance activites: Backward walking;Direction changes;Sudden stops High Level Balance Comments: min/gaurd for above, no overt LOB             Pertinent Vitals/Pain Pain Assessment: No/denies pain    Home Living Family/patient expects to be discharged to:: Private residence Living Arrangements: Spouse/significant other Available Help at Discharge: Family;Available 24 hours/day Type of Home: Mobile home Home Access: Ramped entrance     Home Layout: One level Home Equipment: [place] - 2 wheels;Bedside commode      Prior Function Level of Independence: Independent         Comments: 43yo grandson lives with pt  and wife also     Hand Dominance   Dominant Hand: Right    Extremity/Trunk Assessment   Upper Extremity Assessment Upper Extremity Assessment: Defer to OT evaluation    Lower Extremity Assessment Lower Extremity Assessment: Overall WFL for tasks assessed RLE Deficits / Details: pt reports bil numbness in feet at baseline; strength 4+ to 5/5; AROM WFL       Communication   Communication: No difficulties  Cognition Arousal/Alertness: Awake/alert Behavior During Therapy: WFL for tasks assessed/performed Overall Cognitive Status: No family/caregiver present to determine baseline cognitive functioning  General Comments      Exercises     Assessment/Plan    PT Assessment Patient needs continued PT services  PT Problem List Decreased balance;Decreased mobility;Decreased safety awareness       PT Treatment Interventions Gait training;Functional mobility training;Patient/family education;Balance training    PT Goals (Current goals can be found in the Care  Plan section)  Acute Rehab PT Goals Patient Stated Goal: back to work, get back outside PT Goal Formulation: With patient Time For Goal Achievement: 06/02/17 Potential to Achieve Goals: Good    Frequency Min 3X/week   Barriers to discharge        Co-evaluation               AM-PAC PT "6 Clicks" Daily Activity  Outcome Measure Difficulty turning over in bed (including adjusting bedclothes, sheets and blankets)?: A Little Difficulty moving from lying on back to sitting on the side of the bed? : A Little Difficulty sitting down on and standing up from a chair with arms (e.g., wheelchair, bedside commode, etc,.)?: A Little Help needed moving to and from a bed to chair (including a wheelchair)?: A Little Help needed walking in hospital room?: A Little Help needed climbing 3-5 steps with a railing? : A Little 6 Click Score: 18    End of Session Equipment Utilized During Treatment: Gait belt Activity Tolerance: Patient tolerated treatment well Patient left: in chair;with call bell/phone within reach;Other (comment)(OT)   PT Visit Diagnosis: Difficulty in walking, not elsewhere classified (R26.2);Hemiplegia and hemiparesis Hemiplegia - Right/Left: Right Hemiplegia - dominant/non-dominant: Dominant Hemiplegia - caused by: Cerebral infarction    Time: 0923-3007 PT Time Calculation (min) (ACUTE ONLY): 10 min   Charges:   PT Evaluation $PT Eval Low Complexity: 1 Low     PT G CodesKenyon Ana, PT Pager: [phone] 05/26/2017   [facility] 05/26/2017, 10:52 AM

## 2017-05-26 NOTE — Care Management Obs Status (Signed)
[facility] NOTIFICATION   Patient Details  Name: Todd Berg MRN: [id number] Date of Birth: [birth year]   Medicare Observation Status Notification Given:  Yes    Erenest Rasher, RN 05/26/2017, 12:00 PM

## 2017-05-26 NOTE — Evaluation (Signed)
Occupational Therapy Evaluation Patient Details Name: Todd Berg MRN: [id number] DOB: [birth year] Today's Date: 05/26/2017    History of Present Illness Todd Berg is a 67 y.o. male with medical history significant for rectal cancer 2015, chronic back pain, hypertension, COPD, GERD. Came to ED with balance deficits and slurred speech.  On arrival, the patient was dysarthric, with some right-sided deficits, right and lower extremity weakness. MRI revealed infarct left lateral thalamus   Clinical Impression   PTA Pt independent in ADL and mobility. Loves to work outside. Pt is currently mod I for ADL and transfers. His RUE and hand display mild decrease in strength and coordination. HEP provided for fine motor control and strengthening with pink theraputty. Encouraged him to perform functional tasks that challenge his RUE. OT will continue to follow acutely prior to dc - but do not anticipate post-acute follow up. Educated Pt that if he continues to have problems with RUE and coordination that he should ask his MD about outpatient OT - but currently do not feel like he will need it.     Follow Up Recommendations  No OT follow up;Supervision - Intermittent    Equipment Recommendations  None recommended by OT    Recommendations for Other Services       Precautions / Restrictions Precautions Precautions: Fall Restrictions Weight Bearing Restrictions: No      Mobility Bed Mobility               General bed mobility comments: pt in chair  Transfers Overall transfer level: Needs assistance Equipment used: None Transfers: Sit to/from Stand Sit to Stand: Supervision         General transfer comment: for safety only    Balance Overall balance assessment: Mild deficits observed, not formally tested             Standing balance comment: fair to good, tolerates min challenges, reactions mildly delayed             High level balance activites: Backward  walking;Direction changes;Sudden stops High Level Balance Comments: min/gaurd for above, no overt LOB           ADL either performed or assessed with clinical judgement   ADL Overall ADL's : Modified independent                                       General ADL Comments: increased time required      Vision Baseline Vision/History: Wears glasses Wears Glasses: At all times Patient Visual Report: No change from baseline Vision Assessment?: No apparent visual deficits     Perception     Praxis      Pertinent Vitals/Pain Pain Assessment: No/denies pain     Hand Dominance Right   Extremity/Trunk Assessment Upper Extremity Assessment Upper Extremity Assessment: RUE deficits/detail RUE Deficits / Details: dis-coordination and increased efforts, R slightly less strength than left (4+/5), increased time and effort for fine motor control RUE Coordination: decreased fine motor   Lower Extremity Assessment Lower Extremity Assessment: Defer to PT evaluation RLE Deficits / Details: pt reports bil numbness in feet at baseline; strength 4+ to 5/5; AROM WFL       Communication Communication Communication: No difficulties   Cognition Arousal/Alertness: Awake/alert Behavior During Therapy: WFL for tasks assessed/performed Overall Cognitive Status: No family/caregiver present to determine baseline cognitive functioning  General Comments       Exercises Exercises: Other exercises Other Exercises Other Exercises: Theraputty exercise handout provided with pink theraputty Other Exercises: Fine motor coordination handout provided   Shoulder Instructions      Home Living Family/patient expects to be discharged to:: Private residence Living Arrangements: Spouse/significant other Available Help at Discharge: Family;Available 24 hours/day Type of Home: Mobile home Home Access: Ramped entrance     Home Layout:  One level     Bathroom Shower/Tub: Occupational psychologist: Handicapped height     Home Equipment: Environmental consultant - 2 wheels;Bedside commode      Lives With: Spouse;Other (Comment)    Prior Functioning/Environment Level of Independence: Independent        Comments: 70yo grandson lives with pt  and wife also        OT Problem List: Decreased strength;Decreased coordination;Impaired UE functional use      OT Treatment/Interventions: Therapeutic exercise;Self-care/ADL training;Therapeutic activities;Patient/family education    OT Goals(Current goals can be found in the care plan section) Acute Rehab OT Goals Patient Stated Goal: back to work, get back outside OT Goal Formulation: With patient Time For Goal Achievement: 06/09/17 Potential to Achieve Goals: Good ADL Goals Pt/caregiver will Perform Home Exercise Program: Increased strength;Right Upper extremity;With theraputty;Independently;With written HEP provided  OT Frequency: Min 2X/week   Barriers to D/C:            Co-evaluation              AM-PAC PT "6 Clicks" Daily Activity     Outcome Measure Help from another person eating meals?: None Help from another person taking care of personal grooming?: None Help from another person toileting, which includes using toliet, bedpan, or urinal?: None Help from another person bathing (including washing, rinsing, drying)?: None Help from another person to put on and taking off regular upper body clothing?: None Help from another person to put on and taking off regular lower body clothing?: None 6 Click Score: 24   End of Session Equipment Utilized During Treatment: Gait belt Nurse Communication: Mobility status  Activity Tolerance: Patient tolerated treatment well Patient left: in chair;with call bell/phone within reach  OT Visit Diagnosis: Muscle weakness (generalized) (M62.81);Hemiplegia and hemiparesis Hemiplegia - Right/Left: Right Hemiplegia -  dominant/non-dominant: Dominant Hemiplegia - caused by: Cerebral infarction                Time: 4332-9518 OT Time Calculation (min): 22 min Charges:  OT General Charges $OT Visit: 1 Visit OT Evaluation $OT Eval Moderate Complexity: 1 Mod G-Codes:     Hulda Humphrey OTR/L [place] 05/26/2017, 11:36 AM

## 2017-05-26 NOTE — Progress Notes (Addendum)
STROKE TEAM PROGRESS NOTE  HPI: ( Dr Rory Percy) Todd Berg is a 67 y.o. male was past medical history of rectal cancer, chronic back pain, HTN, who was in his usual state of health with last known normal per EMS reported by his wife at 10 AM.  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  He drove himself back home and on his arrival home his wife noticed that his speech was off and he appeared confused and speech appeared slurred. EMS was called to evaluate him for this.  They evaluated him and found him dysarthric as well as having right-sided deficits-right upper and lower extremity weakness.  A code stroke was called and he was brought into the emergency room.  He was brought in from [facility] and by the time he arrived at the ER bridge, he was outside of the 4-1/2-hour window for IV TPA. On initial examination his NIH stroke scale was 2 for dysarthria and 1 for right leg drift. Because of being outside the window and low stroke scale, he was not offered IV TPA. He did not have any cortical signs on his exam. He denies any preceding sicknesses or illnesses.  Denies any headache.  Denies visual symptoms.  Denies chest pain shortness of breath nausea vomiting.  LKW: 10 AM on 05/25/2017 tpa given?: no, outside the window Premorbid modified Rankin scale (mRS): 0    INTERVAL HISTORY He is sitting up in the chair at the bedside.  No family present. I have personally reviewed history of presenting illness in details with the patient.he states his doing better but still has some diminished fine motor skills in his hand.  Vitals:   05/26/17 0019 05/26/17 0209 05/26/17 0411 05/26/17 0752  BP: (!) 152/77 (!) 147/86 (!) 155/87 (!) 147/78  Pulse: (!) 56 (!) 55 (!) 56 (!) 56  Resp: 16 16 18 17   Temp: (!) 97.5 F (36.4 C) 97.7 F (36.5 C) 97.8 F (36.6 C) 97.7 F (36.5 C)  TempSrc: Oral Oral Oral Oral  SpO2: 97% 99% 98% 98%  Weight:       Height:        CBC:  Recent Labs  Lab 05/25/17 1439 05/25/17 1443  WBC 8.0  --   NEUTROABS 5.0  --   HGB 12.7* 13.3  HCT 40.5 39.0  MCV 81.5  --   PLT 234  --     Basic Metabolic Panel:  Recent Labs  Lab 05/25/17 1439 05/25/17 1443  NA 142 142  K 3.5 3.5  CL 109 105  CO2 25  --   GLUCOSE 87 90  BUN 12 13  CREATININE 0.98 0.90  CALCIUM 9.0  --    Lipid Panel:     Component Value Date/Time   CHOL 177 05/26/2017 0351   CHOL 207 (H) 03/17/2017 1050   TRIG 88 05/26/2017 0351   HDL 38 (L) 05/26/2017 0351   HDL 47 03/17/2017 1050   CHOLHDL 4.7 05/26/2017 0351   VLDL 18 05/26/2017 0351   LDLCALC 121 (H) 05/26/2017 0351   LDLCALC 139 (H) 03/17/2017 1050   HgbA1c:  Lab Results  Component Value Date   HGBA1C 5.6 05/25/2017   Urine Drug Screen:     Component Value Date/Time   LABOPIA NONE DETECTED 05/25/2017 1735   COCAINSCRNUR NONE DETECTED 05/25/2017 1735   LABBENZ NONE DETECTED 05/25/2017 1735   AMPHETMU NONE DETECTED 05/25/2017 1735  THCU NONE DETECTED 05/25/2017 1735   LABBARB NONE DETECTED 05/25/2017 1735    Alcohol Level     Component Value Date/Time   ETH (H) 02/26/2007 2320    109        LOWEST DETECTABLE LIMIT FOR SERUM ALCOHOL IS 11 mg/dL FOR MEDICAL PURPOSES ONLY    IMAGING Mr Angiogram Head Wo Contrast  Result Date: 05/25/2017 CLINICAL DATA:  Acute confusion EXAM: MRI HEAD WITHOUT CONTRAST MRA HEAD WITHOUT CONTRAST TECHNIQUE: Multiplanar, multiecho pulse sequences of the brain and surrounding structures were obtained without intravenous contrast. Angiographic images of the head were obtained using MRA technique without contrast. COMPARISON:  CT head 05/25/2017 FINDINGS: MRI HEAD FINDINGS Brain: 8 mm cute infarct left lateral thalamus. No other acute infarct Mild chronic microvascular ischemic changes in the white matter. Negative for hydrocephalus. Negative for hemorrhage or mass. Vascular: Normal arterial flow voids Skull and upper  cervical spine: Negative Sinuses/Orbits: Negative Other: None MRA HEAD FINDINGS Right vertebral artery does not contribute to the basilar. PICA patent bilaterally. Left vertebral artery widely patent. Basilar widely patent. Superior cerebellar and posterior cerebral arteries patent bilaterally. Fetal origin posterior cerebral artery bilaterally. Mild stenosis distal right posterior cerebral artery branches. Mild atherosclerotic disease in the cavernous carotid bilaterally. Anterior and middle cerebral arteries are patent bilaterally. Mild stenosis in the anterior cerebral arteries bilaterally A2 segment. Mild stenosis A1 segment bilaterally. Mild stenosis left middle cerebral artery branches. Right middle cerebral artery branches patent. Negative for aneurysm. IMPRESSION: 8 mm acute infarct left lateral thalamus. Mild chronic microvascular ischemia Occluded distal right vertebral artery. Mild intracranial atherosclerotic disease. Electronically Signed   By: Franchot Gallo M.D.   On: 05/25/2017 15:40   Mr Jodene Nam Neck W Wo Contrast  Result Date: 05/25/2017 CLINICAL DATA:  Follow-up examination for acute stroke. EXAM: MRA NECK WITHOUT AND WITH CONTRAST TECHNIQUE: Multiplanar and multiecho pulse sequences of the neck were obtained without and with intravenous contrast. Angiographic images of the neck were obtained using MRA technique without and with intravenous contrast. CONTRAST:  88mL MULTIHANCE GADOBENATE DIMEGLUMINE 529 MG/ML IV SOLN COMPARISON:  Prior MRI and MRA of the head from earlier the same day. FINDINGS: Source images reviewed. Partially visualized aortic arch of normal caliber, although the origin of the great vessels are incompletely evaluated on this exam. Visualized subclavian arteries demonstrate mild atherosclerotic change without flow-limiting stenosis. Right common carotid artery patent from its origin to the bifurcation without stenosis. Mild atheromatous irregularity about the right bifurcation  without hemodynamically significant stenosis. Right ICA widely patent distally to the skull base without stenosis or occlusion. Visualized left common carotid artery patent from to the bifurcation without flow-limiting stenosis. Atheromatous irregularity about the left bifurcation without hemodynamically significant stenosis. Left ICA widely patent distally to the skull base without stenosis or vascular occlusion. Both of the vertebral arteries arise from the subclavian arteries. Left vertebral artery dominant, with a diffusely hypoplastic right vertebral artery. Visualized portions of the vertebral arteries patent within the neck without hemodynamically significant stenosis or vascular occlusion. Hypoplastic right vertebral artery terminates in PICA. IMPRESSION: 1. Mild atheromatous irregularity about the carotid bifurcations bilaterally without hemodynamically significant stenosis. 2. Patent vertebral arteries within the neck without significant stenosis. Left vertebral artery dominant. Hypoplastic right vertebral artery terminates in PICA. Electronically Signed   By: Jeannine Boga M.D.   On: 05/25/2017 20:43   Mr Brain Wo Contrast  Result Date: 05/25/2017 CLINICAL DATA:  Acute confusion EXAM: MRI HEAD WITHOUT CONTRAST MRA HEAD WITHOUT CONTRAST TECHNIQUE:  Multiplanar, multiecho pulse sequences of the brain and surrounding structures were obtained without intravenous contrast. Angiographic images of the head were obtained using MRA technique without contrast. COMPARISON:  CT head 05/25/2017 FINDINGS: MRI HEAD FINDINGS Brain: 8 mm cute infarct left lateral thalamus. No other acute infarct Mild chronic microvascular ischemic changes in the white matter. Negative for hydrocephalus. Negative for hemorrhage or mass. Vascular: Normal arterial flow voids Skull and upper cervical spine: Negative Sinuses/Orbits: Negative Other: None MRA HEAD FINDINGS Right vertebral artery does not contribute to the basilar. PICA  patent bilaterally. Left vertebral artery widely patent. Basilar widely patent. Superior cerebellar and posterior cerebral arteries patent bilaterally. Fetal origin posterior cerebral artery bilaterally. Mild stenosis distal right posterior cerebral artery branches. Mild atherosclerotic disease in the cavernous carotid bilaterally. Anterior and middle cerebral arteries are patent bilaterally. Mild stenosis in the anterior cerebral arteries bilaterally A2 segment. Mild stenosis A1 segment bilaterally. Mild stenosis left middle cerebral artery branches. Right middle cerebral artery branches patent. Negative for aneurysm. IMPRESSION: 8 mm acute infarct left lateral thalamus. Mild chronic microvascular ischemia Occluded distal right vertebral artery. Mild intracranial atherosclerotic disease. Electronically Signed   By: Franchot Gallo M.D.   On: 05/25/2017 15:40   Ct Head Code Stroke Wo Contrast  Result Date: 05/25/2017 CLINICAL DATA:  Code stroke. Confusion and slurred speech. Right-sided deficits. EXAM: CT HEAD WITHOUT CONTRAST TECHNIQUE: Contiguous axial images were obtained from the base of the skull through the vertex without intravenous contrast. COMPARISON:  10/23/2014 FINDINGS: Brain: No evidence of acute infarction, hemorrhage, hydrocephalus, extra-axial collection or mass lesion/mass effect. Vascular: No hyperdense vessel or unexpected calcification. Skull: Normal. Negative for fracture or focal lesion. Sinuses/Orbits: No acute finding. Other: These results were communicated to Dr. Rory Percy at 2:45 pmon 5/15/2019by text page via the [facility] messaging system. ASPECTS [facility] Stroke Program Early CT Score) - Ganglionic level infarction (caudate, lentiform nuclei, internal capsule, insula, M1-M3 cortex): 7 - Supraganglionic infarction (M4-M6 cortex): 3 Total score (0-10 with 10 being normal): 10 IMPRESSION: Negative head CT.  ASPECTS is 10. Electronically Signed   By: Monte Fantasia M.D.   On: 05/25/2017 14:46     PHYSICAL EXAM per Leonie Man GENERAL: Awake, alert in NAD HEENT: - Normocephalic and atraumatic, dry mm LUNGS - Clear to auscultation bilaterally with no wheezes CV - S1S2 RRR, no m/r/g, equal pulses bilaterally. Ext: warm, well perfused, intact peripheral pulses, no edema  NEURO:  Mental Status: AA&Ox3  Language: speech is dysarthric.  Naming, repetition, fluency, and comprehension intact.  Attention concentration is limited Cranial Nerves: PERRL. EOMI, visual fields full, mild R facial weakness, facial sensation intact, hearing intact, tongue/uvula/soft palate midline, normal sternocleidomastoid and trapezius muscle strength. No evidence of tongue atrophy or fibrillations Motor: Right lower extremity 5/5 .  Right upper extremity 4+/5 without vertical drift.mild right grip weakness and diminished fine finger movements on the right. Orbits left over right upper extremity.  Left upper and lower extremity 5/5.   Tone: is normal and bulk is normal Sensation- Intact to light touch bilaterally Coordination: FTN intact bilaterally Gait- deferred but according to report was very ataxic at home   ASSESSMENT/PLAN Mr. TAB RYLEE is a 67 y.o. male with history of rectal cancer. Chronic back pain and HTN presenting with slurred speech and confusion.   Stroke:   Tiny L lateral thalamic lacune  infarct in secondary to small vessel disease    Resultant R hand hp, mild facial weakness, dysarthria  Code Stroke CT head No acute  stroke. ASPECTS 10.     MRI, MRA head and neck  Tiny L lateral thalamic lacune. Small vessel disease. Occluded distal R VA  2D Echo  EF 60-65%. No source of embolus   LDL 121  HgbA1c 5.6  Heparin 5000 units sq tid for VTE prophylaxis  No antithrombotic prior to admission, now on aspirin 325 mg daily. Given  mild stroke, will place on aspirin 81 mg and plavix 75 mg daily x 3 weeks, then aspirin alone. Orders adjusted.  Therapy recommendations:  OP SLP, no OP, doubt  PT  Disposition:  Return home  Stroke team will sign off. Follow up in the office in 4 weeks. Orders placed.  Hypertension  Stable . Permissive hypertension (OK if < 220/120) but gradually normalize in 5-7 days . Long-term BP goal normotensive  Hyperlipidemia  Home meds:  No statin  LDL 121, goal < 70  Now on lipitor 80  Continue statin at discharge  Other Stroke Risk Factors  Advanced age  Former Cigarette smoker, quit 7 years ago  Former smokeless tobacco use, quit 11 years ago  Family hx stroke (father)  Other Active Problems  Anxiety/depression  GERD  COPD w/o exacerbation  Hospital day # 0  Burnetta Sabin, MSN, APRN, ANVP-BC, AGPCNP-BC Advanced Practice Stroke Nurse [place] for Schedule & Pager information 05/26/2017 2:02 PM  I have personally examined this patient, reviewed notes, independently viewed imaging studies, participated in medical decision making and plan of care.ROS completed by me personally and pertinent positives fully documented  I have made any additions or clarifications directly to the above note. Agree with note above. He presented with speech difficulties and right-sided weakness due to small left thalamic lacunar infarct from small vessel disease. Recommend dual antiplatelet therapy for 3 weeks followed by aspirin alone. Continue ongoing stroke workup and aggressive risk factor modification. Long discussion with the patient and answered questions about his care. Discussed with Dr. Eliseo Squires. Follow-up as an outpatient in stroke clinic in 6 weeks.greater than 50% time during this 35 minute visit was spent on counseling and coordination of care about his lacunar infarct and answering questions.  Antony Contras, MD Medical Director [place] Pager: [phone] 05/26/2017 2:08 PM  To contact Stroke Continuity provider, please refer to [email or web address] After hours, contact General Neurology

## 2017-05-26 NOTE — Evaluation (Signed)
Speech Language Pathology Evaluation Patient Details Name: Todd Berg MRN: [id number] DOB: May 26, 1950 Today's Date: 05/26/2017 Time: 8921-1941 SLP Time Calculation (min) (ACUTE ONLY): 30 min  Problem List:  Patient Active Problem List   Diagnosis Date Noted  . Stroke (cerebrum) ([place]) 05/25/2017  . Hypertension 03/21/2017  . Generalized anxiety disorder 03/21/2017  . Degenerative disc disease, lumbar 03/17/2017  . Iron deficiency anemia due to chronic blood loss 11/20/2016  . Chronic back pain   . COPD (chronic obstructive pulmonary disease) ([place])   . GERD (gastroesophageal reflux disease)   . Hemorrhoids   . History of rectal cancer 09/13/2015   Past Medical History:  Past Medical History:  Diagnosis Date  . Cancer ([place]) 2015   rectal  . Chronic back pain   . COPD (chronic obstructive pulmonary disease) ([place])   . GERD (gastroesophageal reflux disease)   . Hematemesis   . Hemorrhoids   . Hypotension   . Melena    Past Surgical History:  Past Surgical History:  Procedure Laterality Date  . BACK SURGERY    . ESOPHAGOGASTRODUODENOSCOPY N/A 06/09/2016   Procedure: ESOPHAGOGASTRODUODENOSCOPY (EGD);  Surgeon: Wilford Corner, MD;  Location: [place];  Service: Endoscopy;  Laterality: N/A;  . ESOPHAGOGASTRODUODENOSCOPY (EGD) WITH PROPOFOL Left 11/21/2016   Procedure: ESOPHAGOGASTRODUODENOSCOPY (EGD) WITH PROPOFOL;  Surgeon: Arta Silence, MD;  Location: [facility] ENDOSCOPY;  Service: Endoscopy;  Laterality: Left;  . HEMORRHOID SURGERY    . HERNIA REPAIR    . RECTAL SURGERY     HPI:  67 yo male adm with difficulty walking with speech difficulties.  Pt found to have a left thalamic 8 mm CVA.  Pt PMH + for GERD, anxiety, rectal cancer, and has undergone multiple EGDs 05/2016, 11/2016.  He reports h/o smoking.     Assessment / Plan / Recommendation Clinical Impression  Pt with facial and hypoglossal deficits but no obvious trigeminal, vagus or glossopharyngeal nerve  deficits noted.  He is mildly dysarthric-question impact of dialectal.  MOCA 7.1 given to pt with him scoring 6/30 indicative of a severe cognitive deficit.  Pt admits to changes in expression - having problems with finding words.   Pt with poor attention/awareness to deficits impacting his ability to return to work.  He also stated "I have anxiety" which may have impacted scoring.  Recommend follow up SLP to maximize cognitve linguistic abilities to decrease caregiver burden, pt agreeable to plan.     SLP Assessment  SLP Recommendation/Assessment: Patient needs continued Speech Lanaguage Pathology Services SLP Visit Diagnosis: Cognitive communication deficit (R41.841);Attention and concentration deficit Attention and concentration deficit following: Cerebral infarction    Follow Up Recommendations  Outpatient SLP    Frequency and Duration min 1 x/week  1 week      SLP Evaluation Cognition  Overall Cognitive Status: No family/caregiver present to determine baseline cognitive functioning(pt states deficits are new) Orientation Level: Oriented to person;Oriented to place;Oriented to situation;Disoriented to time Attention: Focused;Sustained Focused Attention: Appears intact Sustained Attention: Impaired Memory: Impaired Awareness: Impaired Problem Solving: Impaired Safety/Judgment: Impaired       Comprehension  Auditory Comprehension Overall Auditory Comprehension: Appears within functional limits for tasks assessed Yes/No Questions: Not tested Commands: Within Functional Limits Conversation: Complex Interfering Components: Attention;Processing speed;Working Field seismologist: Conservation officer, nature: Not tested Reading Comprehension Reading Status: (pt read [place] sign, did not test further)    Expression Expression Primary Mode of Expression: Verbal Verbal Expression Overall Verbal Expression: Appears  within functional limits for tasks  assessed Initiation: No impairment Repetition: Impaired(paraphrases sentences) Level of Impairment: Sentence level Naming: Impairment(2/3 animals named correctly) Pragmatics: No impairment Non-Verbal Means of Communication: Not applicable Written Expression Dominant Hand: Right   Oral / Motor  Oral Motor/Sensory Function Overall Oral Motor/Sensory Function: Mild impairment Facial ROM: Reduced right Facial Symmetry: Abnormal symmetry right Facial Strength: Reduced right Facial Sensation: Reduced right Lingual ROM: Reduced right Lingual Symmetry: Within Functional Limits Lingual Strength: Reduced Velum: Within Functional Limits Motor Speech Overall Motor Speech: Appears within functional limits for tasks assessed Respiration: Within functional limits Phonation: Hoarse;Low vocal intensity Resonance: Within functional limits Articulation: Within functional limitis Intelligibility: Intelligibility reduced Word: 75-100% accurate Phrase: 75-100% accurate Sentence: 75-100% accurate Conversation: 75-100% accurate Motor Planning: Witnin functional limits Motor Speech Errors: Not applicable Interfering Components: Premorbid status;Inadequate dentition Effective Techniques: Slow rate   GO                    Todd Berg 05/26/2017, 10:37 AM Todd Berg, [facility] SLP [phone]

## 2017-05-26 NOTE — Care Management Note (Signed)
Case Management Note  Patient Details  Name: Todd Berg MRN: [id number] Date of Birth: 08-10-1950  Subjective/Objective:    Pt admitted with CVA. He is from home with his spouse.                 Action/Plan: Pt discharging home with self care. CM consulted for outpatient ST. CM met with the patient and wife and they would like to attend outpatient therapy in [facility]. Orders in [place] for [place] therapy. Information on the AVS.  Wife to provide transportation home.   Expected Discharge Date:  05/26/17               Expected Discharge Plan:  OP Rehab  In-House Referral:     Discharge planning Services  CM Consult  Post Acute Care Choice:    Choice offered to:     DME Arranged:    DME Agency:     HH Arranged:    HH Agency:     Status of Service:  Completed, signed off  If discussed at [employer] of Stay Meetings, dates discussed:    Additional Comments:  Pollie Friar, RN 05/26/2017, 2:26 PM

## 2017-05-30 ENCOUNTER — Telehealth: Payer: Self-pay | Admitting: Family Medicine

## 2017-05-30 NOTE — Telephone Encounter (Signed)
I would like him to be seen first

## 2017-05-30 NOTE — Telephone Encounter (Signed)
Patients wife notified that pt ntbs before he writes a note for him to return to work. Patients wife verbalized understanding.

## 2017-05-30 NOTE — Telephone Encounter (Signed)
Looks like patient had a stroke. Is it ok to do note or does he need to wait to be seen? Please advise and route to [facility]

## 2017-06-01 ENCOUNTER — Telehealth (HOSPITAL_COMMUNITY): Payer: Self-pay | Admitting: Speech Pathology

## 2017-06-01 NOTE — Telephone Encounter (Signed)
Wife called to get benefits before coming, she understands they are coverd at 100%. NF 06/01/17

## 2017-06-02 ENCOUNTER — Ambulatory Visit (HOSPITAL_COMMUNITY): Payer: Medicare Other | Attending: Family Medicine | Admitting: Speech Pathology

## 2017-06-02 DIAGNOSIS — R41841 Cognitive communication deficit: Secondary | ICD-10-CM | POA: Insufficient documentation

## 2017-06-03 ENCOUNTER — Telehealth: Payer: Self-pay | Admitting: Family Medicine

## 2017-06-03 ENCOUNTER — Encounter: Payer: Self-pay | Admitting: Family Medicine

## 2017-06-03 ENCOUNTER — Ambulatory Visit (INDEPENDENT_AMBULATORY_CARE_PROVIDER_SITE_OTHER): Payer: Medicare Other | Admitting: Family Medicine

## 2017-06-03 VITALS — BP 138/65 | HR 58 | Temp 97.0°F | Ht 72.0 in | Wt 201.0 lb

## 2017-06-03 DIAGNOSIS — Z8673 Personal history of transient ischemic attack (TIA), and cerebral infarction without residual deficits: Secondary | ICD-10-CM

## 2017-06-03 DIAGNOSIS — R42 Dizziness and giddiness: Secondary | ICD-10-CM | POA: Diagnosis not present

## 2017-06-03 NOTE — Telephone Encounter (Signed)
Patient's wife wondering if date to go back on a Wednesday was correct.  I explained to her that Dr. Warrick Parisian wanted him to take part of that week off and return on the date on the letter.

## 2017-06-03 NOTE — Progress Notes (Signed)
BP 138/65   Pulse (!) 58   Temp (!) 97 F (36.1 C) (Oral)   Ht 6' (1.829 m)   Wt 201 lb (91.2 kg)   BMI 27.26 kg/m    Subjective:    Patient ID: Todd Berg, male    DOB: 10/19/1950, 67 y.o.   MRN: [id number]  HPI: Todd Berg is a 67 y.o. male presenting on 06/03/2017 for Hospitalization Follow-up   HPI Hospital follow-up for stroke Patient is coming in today for hospital follow-up after having had a stroke.  He was admitted to the hospital on 05/25/2017 and discharged on 05/26/2017.  While he was at the hospital he had an MRI and an MRI that showed that he had a lacunar infarct in the hypothalamus region.  He says that he had dizziness and feeling of off balance that day when he was at work.  He said the symptoms resolved very quickly when she got to the hospital.  He does have a hospital follow-up with neurology as well.  They did not find anything on echocardiogram on his neck that would have been the source.  He does admit that he was working on a hot day and got dehydrated on that day before it happened.  He denies any further dizziness or focal numbness or weakness and says he is doing very well today.  He denies any worsened blurred vision or changes in hearing taste or smell.  Relevant past medical, surgical, family and social history reviewed and updated as indicated. Interim medical history since our last visit reviewed. Allergies and medications reviewed and updated.  Review of Systems  Constitutional: Negative for chills and fever.  Respiratory: Negative for shortness of breath and wheezing.   Cardiovascular: Negative for chest pain and leg swelling.  Musculoskeletal: Negative for back pain and gait problem.  Skin: Negative for rash.  Neurological: Negative for dizziness, speech difficulty, weakness, light-headedness, numbness and headaches.  All other systems reviewed and are negative.   Per HPI unless specifically indicated above   Allergies as of 06/03/2017    No Known Allergies     Medication List        Accurate as of 06/03/17 11:28 AM. Always use your most recent med list.          amLODipine 2.5 MG tablet Commonly known as:  NORVASC TAKE 1 TABLET DAILY   aspirin 81 MG EC tablet Take 1 tablet (81 mg total) by mouth daily.   atorvastatin 80 MG tablet Commonly known as:  LIPITOR Take 1 tablet (80 mg total) by mouth daily at 6 PM.   clopidogrel 75 MG tablet Commonly known as:  PLAVIX Take 1 tablet (75 mg total) by mouth daily.   omeprazole 20 MG capsule Commonly known as:  PRILOSEC Take 1 capsule (20 mg total) by mouth 2 (two) times daily before a meal.   pantoprazole 40 MG tablet Commonly known as:  PROTONIX Take 40 mg by mouth daily.   polyethylene glycol powder powder Commonly known as:  MIRALAX Mix 1 capful in 8 ounces of water and drink daily as needed for constipation.   sodium chloride 0.65 % Soln nasal spray Commonly known as:  OCEAN Place 2 sprays as needed into both nostrils for congestion.   tetrahydrozoline 0.05 % ophthalmic solution Place 1 drop daily as needed into both eyes (itching).   triamcinolone cream 0.1 % Commonly known as:  KENALOG Apply 1 application topically 2 (two) times daily.  Objective:    BP 138/65   Pulse (!) 58   Temp (!) 97 F (36.1 C) (Oral)   Ht 6' (1.829 m)   Wt 201 lb (91.2 kg)   BMI 27.26 kg/m   Wt Readings from Last 3 Encounters:  06/03/17 201 lb (91.2 kg)  05/25/17 204 lb 5.9 oz (92.7 kg)  04/08/17 202 lb (91.6 kg)    Physical Exam  Constitutional: He is oriented to person, place, and time. He appears well-developed and well-nourished. No distress.  Eyes: Pupils are equal, round, and reactive to light. Conjunctivae and EOM are normal. No scleral icterus.  Neck: Neck supple. No thyromegaly present.  Cardiovascular: Normal rate, regular rhythm, normal heart sounds and intact distal pulses.  No murmur heard. Pulmonary/Chest: Effort normal and breath  sounds normal. No respiratory distress. He has no wheezes.  Musculoskeletal: Normal range of motion. He exhibits no edema.  Lymphadenopathy:    He has no cervical adenopathy.  Neurological: He is alert and oriented to person, place, and time. He displays normal reflexes. No cranial nerve deficit or sensory deficit. He exhibits normal muscle tone. Coordination normal.  Patient does have a slow gait but it appears to be what he normally has, he does not feel like he has any changes in his gait either.  Skin: Skin is warm and dry. No rash noted. He is not diaphoretic.  Psychiatric: He has a normal mood and affect. His behavior is normal.  Nursing note and vitals reviewed.   Results for orders placed or performed during the hospital encounter of 05/25/17  Protime-INR  Result Value Ref Range   Prothrombin Time 13.1 11.4 - 15.2 seconds   INR 1.00   APTT  Result Value Ref Range   aPTT 27 24 - 36 seconds  CBC  Result Value Ref Range   WBC 8.0 4.0 - 10.5 K/uL   RBC 4.97 4.22 - 5.81 MIL/uL   Hemoglobin 12.7 (L) 13.0 - 17.0 g/dL   HCT 40.5 39.0 - 52.0 %   MCV 81.5 78.0 - 100.0 fL   MCH 25.6 (L) 26.0 - 34.0 pg   MCHC 31.4 30.0 - 36.0 g/dL   RDW 14.5 11.5 - 15.5 %   Platelets 234 150 - 400 K/uL  Differential  Result Value Ref Range   Neutrophils Relative % 63 %   Neutro Abs 5.0 1.7 - 7.7 K/uL   Lymphocytes Relative 24 %   Lymphs Abs 1.9 0.7 - 4.0 K/uL   Monocytes Relative 10 %   Monocytes Absolute 0.8 0.1 - 1.0 K/uL   Eosinophils Relative 1 %   Eosinophils Absolute 0.1 0.0 - 0.7 K/uL   Basophils Relative 1 %   Basophils Absolute 0.1 0.0 - 0.1 K/uL   Immature Granulocytes 1 %   Abs Immature Granulocytes 0.1 0.0 - 0.1 K/uL  Comprehensive metabolic panel  Result Value Ref Range   Sodium 142 135 - 145 mmol/L   Potassium 3.5 3.5 - 5.1 mmol/L   Chloride 109 101 - 111 mmol/L   CO2 25 22 - 32 mmol/L   Glucose, Bld 87 65 - 99 mg/dL   BUN 12 6 - 20 mg/dL   Creatinine, Ser 0.98 0.61 -  1.24 mg/dL   Calcium 9.0 8.9 - 10.3 mg/dL   Total Protein 6.8 6.5 - 8.1 g/dL   Albumin 3.5 3.5 - 5.0 g/dL   AST 19 15 - 41 U/L   ALT 12 (L) 17 - 63 U/L   Alkaline  Phosphatase 72 38 - 126 U/L   Total Bilirubin 0.4 0.3 - 1.2 mg/dL   GFR calc non Af Amer >60 >60 mL/min   GFR calc Af Amer >60 >60 mL/min   Anion gap 8 5 - 15  Urine rapid drug screen (hosp performed)  Result Value Ref Range   Opiates NONE DETECTED NONE DETECTED   Cocaine NONE DETECTED NONE DETECTED   Benzodiazepines NONE DETECTED NONE DETECTED   Amphetamines NONE DETECTED NONE DETECTED   Tetrahydrocannabinol NONE DETECTED NONE DETECTED   Barbiturates NONE DETECTED NONE DETECTED  Hemoglobin A1c  Result Value Ref Range   Hgb A1c MFr Bld 5.6 4.8 - 5.6 %   Mean Plasma Glucose 114.02 mg/dL  Lipid panel  Result Value Ref Range   Cholesterol 177 0 - 200 mg/dL   Triglycerides 88 <150 mg/dL   HDL 38 (L) >40 mg/dL   Total CHOL/HDL Ratio 4.7 RATIO   VLDL 18 0 - 40 mg/dL   LDL Cholesterol 121 (H) 0 - 99 mg/dL  I-stat troponin, ED  Result Value Ref Range   Troponin i, poc 0.01 0.00 - 0.08 ng/mL   Comment 3          CBG monitoring, ED  Result Value Ref Range   Glucose-Capillary 85 65 - 99 mg/dL  I-Stat Chem 8, ED  Result Value Ref Range   Sodium 142 135 - 145 mmol/L   Potassium 3.5 3.5 - 5.1 mmol/L   Chloride 105 101 - 111 mmol/L   BUN 13 6 - 20 mg/dL   Creatinine, Ser 0.90 0.61 - 1.24 mg/dL   Glucose, Bld 90 65 - 99 mg/dL   Calcium, Ion 1.19 1.15 - 1.40 mmol/L   TCO2 24 22 - 32 mmol/L   Hemoglobin 13.3 13.0 - 17.0 g/dL   HCT 39.0 39.0 - 52.0 %  ECHOCARDIOGRAM COMPLETE  Result Value Ref Range   Weight 3,269.86 oz   Height 72 in   BP 166/88 mmHg      Assessment & Plan:   Problem List Items Addressed This Visit    None    Visit Diagnoses    Dizziness    -  Primary   resolved   History of stroke        Patient is still on Plavix and aspirin and he is to continue the Plavix for 2 more weeks and then stop it  but continue the aspirin after that.  He denies any blood in his stool.  He has follow-up with neurology as well.  Follow up plan: Return if symptoms worsen or fail to improve.  Counseling provided for all of the vaccine components No orders of the defined types were placed in this encounter.   Caryl Pina, MD [facility] 06/03/2017, 11:28 AM

## 2017-06-07 ENCOUNTER — Encounter (HOSPITAL_COMMUNITY): Payer: Self-pay | Admitting: Speech Pathology

## 2017-06-07 NOTE — Therapy (Signed)
[place], [place], [address] Phone: [phone]   Fax:  [phone]  Speech Language Pathology Evaluation  Patient Details  Name: Todd Berg MRN: [id number] Date of Birth: Jan 11, 1951 Referring Provider: Eulogio Bear, DO   Encounter Date: 06/02/2017  End of Session - 06/02/17 1943    Visit Number  1    Number of Visits  4    Date for SLP Re-Evaluation  06/16/17    Authorization Type  [employer] Medicare no copay, no deductible, OOP $6700, no met, covered at 100%    Authorization Time Period  before 10th visit    SLP Start Time  1515    SLP Stop Time   1600    SLP Time Calculation (min)  45 min    Activity Tolerance  Patient tolerated treatment well       Past Medical History:  Diagnosis Date  . Cancer ([place]) 2015   rectal  . Chronic back pain   . COPD (chronic obstructive pulmonary disease) ([place])   . GERD (gastroesophageal reflux disease)   . Hematemesis   . Hemorrhoids   . Hypotension   . Melena   . Stroke [facility])     Past Surgical History:  Procedure Laterality Date  . BACK SURGERY    . ESOPHAGOGASTRODUODENOSCOPY N/A 06/09/2016   Procedure: ESOPHAGOGASTRODUODENOSCOPY (EGD);  Surgeon: Wilford Corner, MD;  Location: [place];  Service: Endoscopy;  Laterality: N/A;  . ESOPHAGOGASTRODUODENOSCOPY (EGD) WITH PROPOFOL Left 11/21/2016   Procedure: ESOPHAGOGASTRODUODENOSCOPY (EGD) WITH PROPOFOL;  Surgeon: Arta Silence, MD;  Location: [facility] ENDOSCOPY;  Service: Endoscopy;  Laterality: Left;  . HEMORRHOID SURGERY    . HERNIA REPAIR    . RECTAL SURGERY      There were no vitals filed for this visit.  Subjective Assessment - 06/02/17 1931    Subjective  "I feel like I am doing fine."    Patient is accompained by:  Family member    Special Tests  MoCA blind    Currently in Pain?  No/denies         SLP Evaluation [facility] - 06/02/17 1931      SLP Visit Information   SLP Received On  06/02/17    Referring Provider  Eulogio Bear, DO    Onset Date  05/25/2017    Medical Diagnosis  s/p left lateral thalamic stroke      Subjective   Subjective  "I am doing alright."    Patient/Family Stated Goal  Go back to work      General Information   HPI  67 yo male adm with difficulty walking with speech difficulties.  Pt found to have a left thalamic 8 mm CVA.  Pt PMH + for GERD, anxiety, rectal cancer, and has undergone multiple EGDs 05/2016, 11/2016.  He reports h/o smoking.      Behavioral/Cognition  Alert and cooperative    Mobility Status  ambulatory      Balance Screen   Has the patient fallen in the past 6 months  No    Has the patient had a decrease in activity level because of a fear of falling?   No    Is the patient reluctant to leave their home because of a fear of falling?   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    Vocation  Part time employment      Pain Assessment   Pain Assessment  No/denies pain      Cognition   Overall Cognitive Status  Impaired/Different from baseline    Area of Impairment  Attention;Memory    Current Attention Level  Selective    Memory  Decreased recall of precautions;Decreased short-term memory    Attention  Selective    Selective Attention  Impaired    Selective Attention Impairment  Verbal complex    Memory  Impaired    Memory Impairment  Storage deficit;Retrieval deficit;Decreased recall of new information;Decreased short term memory    Decreased Short Term Memory  Verbal complex;Functional complex    Awareness  Appears intact    Problem Solving  Impaired    Problem Solving Impairment  Functional complex      Auditory Comprehension   Overall Auditory Comprehension  Appears within  functional limits for tasks assessed    Yes/No Questions  Within Functional Limits    Commands  Within Functional Limits    Conversation  Simple    Interfering Components  Attention;Processing speed;Working Financial planner  Within [employer]      Reading Comprehension   Reading Status  Not tested      Expression   Primary Mode of Expression  Verbal      Verbal Expression   Overall Verbal Expression  Impaired    Initiation  No impairment    Automatic Speech  Name;Social Response;Counting    Level of Generative/Spontaneous Verbalization  Conversation    Repetition  Impaired    Level of Impairment  Sentence level    Naming  Impairment    Responsive  76-100% accurate    Confrontation  75-100% accurate    Convergent  75-100% accurate    Divergent  50-74% accurate    Pragmatics  No impairment    Non-Verbal Means of Communication  Not applicable      Written Expression   Dominant Hand  Right    Written Expression  Not tested      Oral Motor/Sensory Function   Overall Oral Motor/Sensory Function  Appears within functional limits for tasks assessed      Motor Speech   Overall Motor Speech  Appears within functional limits for tasks assessed    Respiration  Within functional limits    Phonation  Normal    Resonance  Within functional limits    Articulation  Within functional limitis    Intelligibility  Intelligible    Motor Planning  Witnin functional limits    Motor Speech Errors  Not applicable    Interfering Components  Premorbid status;Inadequate dentition    Phonation  WFL      Standardized Assessments   Standardized Assessments   Montreal Cognitive Assessment (MOCA)    Montreal Cognitive Assessment (MOCA)   2/22 on MoCA blind      SLP - End of Session   Patient left  in chair        SLP Short Term Goals - 06/02/17 1950      SLP SHORT TERM GOAL #1    Title  Pt will implement memory strategies during functional activities to improve recall to 80%  acc with mod assist.    Baseline  0/5 after 5 minutes    Time  2    Period  Weeks    Status  New    Target Date  06/16/17      SLP SHORT TERM GOAL #2   Title  Pt will implement word finding strategies in conversation and functional tasks with 80% acc when provided mod cues.     Baseline  Pt frequently states, "I can't remember...what's it called" during conversations    Time  2    Period  Weeks    Status  New    Target Date  06/16/17       SLP Long Term Goals - 06/07/17 1955      SLP LONG TERM GOAL #1   Title  Same as short       Plan - 06/02/17 1946    Clinical Impression Statement  Pt scored a 2/22 on the [facility] version, indicating severe cognitive impairment. Pt reports a 9th grade education and repeatedly stated that he was "not educated" and "couldn't do these things before". His wife was present for the evaluation and indicated that she felt her husband was close to baseline, but that he was having some trouble finding his words. Pt enjoys playing the banjo and guitar, working on cars, reading the bible, going to [employer], and watching TV. 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 His wife also indicates that he has gotten lost coming home from work in the past couple of months. Pt presents with significant cognitive impairment, however it is difficult to determine his baseline. He plans to return to work as soon as possible. It appears that he would be able to use the counter at work, but driving to work would be a concern. Will plan for therapy 2x/week for 2 weeks until he returns to work.    Speech Therapy Frequency  2x / week    Duration  2 weeks    Treatment/Interventions  Cognitive reorganization;SLP instruction and feedback;Internal/external aids;Compensatory strategies;Compensatory  techniques;Patient/family education;Cueing hierarchy    Potential to Achieve Goals  Fair    Potential Considerations  Ability to learn/carryover information;Previous level of function    SLP Home Exercise Plan  Pt will complete HEP as assigned to facilitate carryover of treatment strategies and techniques in home environment with assist from spouse as needed.     Consulted and Agree with Plan of Care  Patient;Family member/caregiver    Family Member Consulted  wife       Patient will benefit from skilled therapeutic intervention in order to improve the following deficits and impairments:   Cognitive communication deficit    Problem List Patient Active Problem List   Diagnosis Date Noted  . Hypertension 03/21/2017  . Generalized anxiety disorder 03/21/2017  . Degenerative disc disease, lumbar 03/17/2017  . Iron deficiency anemia due to chronic blood loss 11/20/2016  . Chronic back pain   . COPD (chronic obstructive pulmonary disease) ([place])   . GERD (gastroesophageal reflux disease)   . Hemorrhoids   . History of rectal cancer 09/13/2015   Thank you,  [name], [place]  [facility] 06/02/2017, 7:57 PM  [place] [address] [place], [place], [address] Phone: [phone]   Fax:  [phone]  Name: Todd Berg MRN: [id number] Date of Birth: December 05, 1950

## 2017-06-09 ENCOUNTER — Ambulatory Visit (HOSPITAL_COMMUNITY): Payer: Medicare Other | Admitting: Speech Pathology

## 2017-06-09 ENCOUNTER — Other Ambulatory Visit: Payer: Self-pay | Admitting: *Deleted

## 2017-06-09 ENCOUNTER — Encounter (HOSPITAL_COMMUNITY): Payer: Self-pay | Admitting: Speech Pathology

## 2017-06-09 DIAGNOSIS — R41841 Cognitive communication deficit: Secondary | ICD-10-CM | POA: Diagnosis not present

## 2017-06-09 MED ORDER — ATORVASTATIN CALCIUM 80 MG PO TABS: 80.0000 mg | ORAL_TABLET | Freq: Every day | ORAL | 0 refills | Status: DC

## 2017-06-09 NOTE — Therapy (Signed)
[place], [place], [address] Phone: [phone]   Fax:  [phone]  Speech Language Pathology Treatment  Patient Details  Name: Todd Berg MRN: [id number] Date of Birth: February 23, 1950 Referring Provider: Eulogio Bear, DO   Encounter Date: 06/09/2017  End of Session - 06/09/17 2218    Visit Number  2    Number of Visits  4    Date for SLP Re-Evaluation  06/16/17    Authorization Type  [employer] Medicare no copay, no deductible, OOP $6700, no met, covered at 100%    Authorization Time Period  before 10th visit    SLP Start Time  1330    SLP Stop Time   1415    SLP Time Calculation (min)  45 min    Activity Tolerance  Patient tolerated treatment well       Past Medical History:  Diagnosis Date  . Cancer ([place]) 2015   rectal  . Chronic back pain   . COPD (chronic obstructive pulmonary disease) ([place])   . GERD (gastroesophageal reflux disease)   . Hematemesis   . Hemorrhoids   . Hypotension   . Melena   . Stroke [facility])     Past Surgical History:  Procedure Laterality Date  . BACK SURGERY    . ESOPHAGOGASTRODUODENOSCOPY N/A 06/09/2016   Procedure: ESOPHAGOGASTRODUODENOSCOPY (EGD);  Surgeon: Wilford Corner, MD;  Location: [facility];  Service: Endoscopy;  Laterality: N/A;  . ESOPHAGOGASTRODUODENOSCOPY (EGD) WITH PROPOFOL Left 11/21/2016   Procedure: ESOPHAGOGASTRODUODENOSCOPY (EGD) WITH PROPOFOL;  Surgeon: Arta Silence, MD;  Location: [facility] ENDOSCOPY;  Service: Endoscopy;  Laterality: Left;  . HEMORRHOID SURGERY    . HERNIA REPAIR    . RECTAL SURGERY      There were no vitals filed for this visit.  Subjective Assessment - 06/09/17 2015    Subjective  "I am going back to work next week."    Currently in Pain?  No/denies       ADULT SLP TREATMENT - 06/09/17 0001      General Information   Behavior/Cognition  Alert;Cooperative;Pleasant mood    Patient Positioning  Upright in chair    Oral care  provided  N/A    HPI  67 yo male adm with difficulty walking with speech difficulties.  Pt found to have a left thalamic 8 mm CVA.  Pt PMH + for GERD, anxiety, rectal cancer, and has undergone multiple EGDs 05/2016, 11/2016.  He reports h/o smoking.      Treatment Provided   Treatment provided  Cognitive-Linquistic      Pain Assessment   Pain Assessment  No/denies pain      Cognitive-Linquistic Treatment   Treatment focused on  Cognition;Patient/family/caregiver education;Aphasia    Skilled Treatment  Introduced and provided word finding, speech intelligibility, and memory strategies.      Assessment / Recommendations / Plan   Plan  Continue with current plan of care      Progression Toward Goals   Progression toward goals  Progressing toward goals       SLP Education - 06/09/17 2207    Education Details  Provided list of and reviewed memory, speech intelligibility, and word finding strategies.    Person(s) Educated  Patient;Spouse    Methods  Explanation;Demonstration;Handout    Comprehension  Verbalized understanding       SLP Short Term Goals - 06/09/17 2219      SLP SHORT TERM GOAL #1   Title  Pt will  implement memory strategies during functional activities to improve recall to 80% acc with mod assist.    Baseline  0/5 after 5 minutes    Time  2    Period  Weeks    Status  On-going      SLP SHORT TERM GOAL #2   Title  Pt will implement word finding strategies in conversation and functional tasks with 80% acc when provided mod cues.     Baseline  Pt frequently states, "I can't remember...what's it called" during conversations    Time  2    Period  Weeks    Status  On-going       SLP Long Term Goals - 06/07/17 1955      SLP LONG TERM GOAL #1   Title  Same as short       Plan - 06/09/17 2219    Clinical Impression Statement Pt was accompanied to today's treatment session by his wife. Pt saw his PCP last week and plans to return to work next Wednesday. He will  see the neurologist in mid-June. SLP provided memory, word finding, and speech intelligibility strategies in written format and provided examples of each accordingly. Pt able to identify strategies that may be beneficial to him in the home. He labeled low frequency pictures with 90% acc with min cues. He also named to description with 88% acc with min cues and needed moderate assistance when describing pictured objects to his spouse. Next session, will continue to target goals in functional tasks in preparation for return to work.    Speech Therapy Frequency  2x / week    Duration  2 weeks    Treatment/Interventions  Cognitive reorganization;SLP instruction and feedback;Internal/external aids;Compensatory strategies;Compensatory techniques;Patient/family education;Cueing hierarchy    Potential to Achieve Goals  Fair    Potential Considerations  Ability to learn/carryover information;Previous level of function    SLP Home Exercise Plan  Pt will complete HEP as assigned to facilitate carryover of treatment strategies and techniques in home environment with assist from spouse as needed.     Consulted and Agree with Plan of Care  Patient;Family member/caregiver    Family Member Consulted  wife       Patient will benefit from skilled therapeutic intervention in order to improve the following deficits and impairments:   Cognitive communication deficit    Problem List Patient Active Problem List   Diagnosis Date Noted  . Hypertension 03/21/2017  . Generalized anxiety disorder 03/21/2017  . Degenerative disc disease, lumbar 03/17/2017  . Iron deficiency anemia due to chronic blood loss 11/20/2016  . Chronic back pain   . COPD (chronic obstructive pulmonary disease) ([place])   . GERD (gastroesophageal reflux disease)   . Hemorrhoids   . History of rectal cancer 09/13/2015   Thank you,  Todd Berg, [place]  [facility] 06/09/2017, 10:20 PM  [place] [address] [place], [place], [address] Phone: [phone]   Fax:  [phone]   Name: Todd Berg MRN: [id number] Date of Birth: 06-10-1950

## 2017-06-11 ENCOUNTER — Emergency Department (HOSPITAL_COMMUNITY): Payer: Medicare Other

## 2017-06-11 ENCOUNTER — Emergency Department (HOSPITAL_COMMUNITY)
Admission: EM | Admit: 2017-06-11 | Discharge: 2017-06-11 | Disposition: A | Discharge status: Home / Self Care | Payer: Medicare Other | Attending: Emergency Medicine | Admitting: Emergency Medicine

## 2017-06-11 ENCOUNTER — Encounter (HOSPITAL_COMMUNITY): Payer: Self-pay | Admitting: *Deleted

## 2017-06-11 DIAGNOSIS — Z8673 Personal history of transient ischemic attack (TIA), and cerebral infarction without residual deficits: Secondary | ICD-10-CM | POA: Diagnosis not present

## 2017-06-11 DIAGNOSIS — R079 Chest pain, unspecified: Secondary | ICD-10-CM | POA: Diagnosis not present

## 2017-06-11 DIAGNOSIS — S299XXA Unspecified injury of thorax, initial encounter: Secondary | ICD-10-CM | POA: Diagnosis present

## 2017-06-11 DIAGNOSIS — Z85048 Personal history of other malignant neoplasm of rectum, rectosigmoid junction, and anus: Secondary | ICD-10-CM | POA: Diagnosis not present

## 2017-06-11 DIAGNOSIS — Z7982 Long term (current) use of aspirin: Secondary | ICD-10-CM | POA: Insufficient documentation

## 2017-06-11 DIAGNOSIS — Z7901 Long term (current) use of anticoagulants: Secondary | ICD-10-CM | POA: Insufficient documentation

## 2017-06-11 DIAGNOSIS — Y999 Unspecified external cause status: Secondary | ICD-10-CM | POA: Insufficient documentation

## 2017-06-11 DIAGNOSIS — W102XXA Fall (on)(from) incline, initial encounter: Secondary | ICD-10-CM | POA: Insufficient documentation

## 2017-06-11 DIAGNOSIS — Y9301 Activity, walking, marching and hiking: Secondary | ICD-10-CM | POA: Diagnosis not present

## 2017-06-11 DIAGNOSIS — J449 Chronic obstructive pulmonary disease, unspecified: Secondary | ICD-10-CM | POA: Diagnosis not present

## 2017-06-11 DIAGNOSIS — Y929 Unspecified place or not applicable: Secondary | ICD-10-CM | POA: Insufficient documentation

## 2017-06-11 DIAGNOSIS — S20212A Contusion of left front wall of thorax, initial encounter: Secondary | ICD-10-CM | POA: Insufficient documentation

## 2017-06-11 DIAGNOSIS — Z79899 Other long term (current) drug therapy: Secondary | ICD-10-CM | POA: Diagnosis not present

## 2017-06-11 MED ORDER — HYDROCODONE-ACETAMINOPHEN 5-325 MG PO TABS: 1.0000 | ORAL_TABLET | Freq: Four times a day (QID) | ORAL | 0 refills | Status: DC | PRN

## 2017-06-11 MED ORDER — OXYCODONE-ACETAMINOPHEN 5-325 MG PO TABS
1.0000 | ORAL_TABLET | Freq: Once | ORAL | Status: AC
  Administered 2017-06-11: 1 via ORAL
  Filled 2017-06-11: qty 1

## 2017-06-11 MED ORDER — IBUPROFEN 400 MG PO TABS
600.0000 mg | ORAL_TABLET | Freq: Once | ORAL | Status: AC
  Administered 2017-06-11: 600 mg via ORAL
  Filled 2017-06-11: qty 2

## 2017-06-11 NOTE — ED Triage Notes (Signed)
Pt fell down a bank while walking 4-5 days ago and landed on left side.  C/o pain to left and right ribs.   Denies hitting head, pt on blood thinners but unable to state what.

## 2017-06-13 ENCOUNTER — Encounter (HOSPITAL_COMMUNITY): Payer: Self-pay | Admitting: Speech Pathology

## 2017-06-13 ENCOUNTER — Ambulatory Visit (HOSPITAL_COMMUNITY): Payer: Medicare Other | Attending: Family Medicine | Admitting: Speech Pathology

## 2017-06-13 DIAGNOSIS — R41841 Cognitive communication deficit: Secondary | ICD-10-CM | POA: Diagnosis not present

## 2017-06-13 NOTE — Therapy (Signed)
[place], [place], [address] Phone: [phone]   Fax:  [phone]  Speech Language Pathology Treatment  Patient Details  Name: Todd Berg MRN: [id number] Date of Birth: [birth year] Referring Provider: Eulogio Bear, DO   Encounter Date: 06/13/2017  End of Session - 06/13/17 1456    Visit Number  3    Number of Visits  4    Date for SLP Re-Evaluation  06/16/17    Authorization Type  [employer] Medicare no copay, no deductible, OOP $6700, no met, covered at 100%    Authorization Time Period  before 10th visit    SLP Start Time  1437    SLP Stop Time   1520    SLP Time Calculation (min)  43 min    Activity Tolerance  Patient tolerated treatment well       Past Medical History:  Diagnosis Date  . Cancer ([place]) 2015   rectal  . Chronic back pain   . COPD (chronic obstructive pulmonary disease) ([place])   . GERD (gastroesophageal reflux disease)   . Hematemesis   . Hemorrhoids   . Hypotension   . Melena   . Stroke [facility])     Past Surgical History:  Procedure Laterality Date  . BACK SURGERY    . ESOPHAGOGASTRODUODENOSCOPY N/A 06/09/2016   Procedure: ESOPHAGOGASTRODUODENOSCOPY (EGD);  Surgeon: Wilford Corner, MD;  Location: [place];  Service: Endoscopy;  Laterality: N/A;  . ESOPHAGOGASTRODUODENOSCOPY (EGD) WITH PROPOFOL Left 11/21/2016   Procedure: ESOPHAGOGASTRODUODENOSCOPY (EGD) WITH PROPOFOL;  Surgeon: Arta Silence, MD;  Location: [facility] ENDOSCOPY;  Service: Endoscopy;  Laterality: Left;  . HEMORRHOID SURGERY    . HERNIA REPAIR    . RECTAL SURGERY      There were no vitals filed for this visit.  Subjective Assessment - 06/13/17 1449    Subjective  "I want to be able to drive."    Patient is accompained by:  Family member    Currently in Pain?  No/denies       ADULT SLP TREATMENT - 06/13/17 1452      General Information   Behavior/Cognition  Alert;Cooperative;Berg mood    Patient  Positioning  Upright in chair    Oral care provided  N/A    HPI  67 yo male adm with difficulty walking with speech difficulties.  Pt found to have a left thalamic 8 mm CVA.  Pt PMH + for GERD, anxiety, rectal cancer, and has undergone multiple EGDs 05/2016, 11/2016.  He reports h/o smoking.      Treatment Provided   Treatment provided  Cognitive-Linquistic      Pain Assessment   Pain Assessment  No/denies pain      Cognitive-Linquistic Treatment   Treatment focused on  Cognition;Patient/family/caregiver education;Aphasia    Skilled Treatment  Introduced and provided word finding, speech intelligibility, and memory strategies.      Assessment / Recommendations / Plan   Plan  Continue with current plan of care         SLP Short Term Goals - 06/13/17 1602      SLP SHORT TERM GOAL #1   Title  Pt will implement memory strategies during functional activities to improve recall to 80% acc with mod assist.    Baseline  0/5 after 5 minutes    Time  2    Period  Weeks    Status  Partially Met      SLP SHORT TERM GOAL #2  Title  Pt will implement word finding strategies in conversation and functional tasks with 80% acc when provided mod cues.     Baseline  Pt frequently states, "I can't remember...what's it called" during conversations    Time  2    Period  Weeks    Status  Partially Met       SLP Long Term Goals - 06/07/17 1955      SLP LONG TERM GOAL #1   Title  Same as short       Plan - 06/13/17 1532    Clinical Impression Statement  Pt was accompanied to therapy by his wife this date. She has been driving him until he is released for driving by neurology per wife. Pt and wife endorse that Pt is doing well at home and report no issues. Upon further SLP probe, Mrs. Blaker stated that Pt frequently asks the same questions regarding visiting the neurologist and return to work date. SLP wrote the information on an index card, along with other pertinent information (wedding  anniversary, PCP, and home phone). He required max cues initially to locate the card in his pocket to retrieve the information, but cues faded to min by the end of the session. 10-item recall with association cues activity completed with Pt in session and he achieved 3/10, 3/10, 6/10, and 6/10 over four trials. Pt benefited from association cues and visualization, however he continues to present with moderate short term memory difficulties despite use of strategies. In addition to working memory deficits, [name] presents with word finding difficulties and dysnomia in conversation. Pt would like to discharge from therapy due to returning to work this Wednesday. SLP discussed word finding and memory deficits with Pt and spouse and both acknowledge their presence, but also feel that it will not interfere with his job requirements. Pt generally sits in a chair and clicks a counter when people bring in trash. SLP suggested that Pt carry a small notebook/index card in his shirt pocket to assist with memory deficits. Given his job description, I also suspect he will be fine to return to work, however caution should be given in regards to return to driving. Will discharge Pt from SLP services at this time, however Pt may benefit from additional SLP services should he desire to return. Goals were only partially met as he only attented two treatment sessions. Pt and spouse are in agreement with plan of care.     Speech Therapy Frequency  2x / week    Duration  2 weeks    Treatment/Interventions  Cognitive reorganization;SLP instruction and feedback;Internal/external aids;Compensatory strategies;Compensatory techniques;Patient/family education;Cueing hierarchy    Potential to Achieve Goals  Fair    Potential Considerations  Ability to learn/carryover information;Previous level of function    SLP Home Exercise Plan  Pt will complete HEP as assigned to facilitate carryover of treatment strategies and techniques in  home environment with assist from spouse as needed.     Consulted and Agree with Plan of Care  Patient;Family member/caregiver    Family Member Consulted  wife       Patient will benefit from skilled therapeutic intervention in order to improve the following deficits and impairments:   Cognitive communication deficit    Problem List Patient Active Problem List   Diagnosis Date Noted  . Hypertension 03/21/2017  . Generalized anxiety disorder 03/21/2017  . Degenerative disc disease, lumbar 03/17/2017  . Iron deficiency anemia due to chronic blood loss 11/20/2016  . Chronic back  pain   . COPD (chronic obstructive pulmonary disease) ([place])   . GERD (gastroesophageal reflux disease)   . Hemorrhoids   . History of rectal cancer 09/13/2015   SPEECH THERAPY DISCHARGE SUMMARY  Visits from Start of Care: 3  Current functional level related to goals / functional outcomes: Partially met due to abbreviated attendence   Remaining deficits: Pt with moderate working memory and word finding deficits   Education / Equipment: Completed  Plan: Patient agrees to discharge.  Patient goals were partially met. Patient is being discharged due to being pleased with the current functional level.  ?????         Thank you,  [name], [place]  [facility] 06/13/2017, 4:03 PM  [place] [address] [place], [place], [address] Phone: [phone]   Fax:  [phone]   Name: FINLEY CHEVEZ MRN: [id number] Date of Birth: Mar 16, 1950

## 2017-06-15 NOTE — ED Provider Notes (Signed)
[facility] EMERGENCY DEPARTMENT Provider Note   CSN: [id number] Arrival date & time: 06/11/17  1903     History   Chief Complaint Chief Complaint  Patient presents with  . Fall    HPI Todd Berg is a 67 y.o. male.  HPI   67 year old male with left-sided chest pain.  Patient was walking and fell down an incline surface especially 4 days ago.  He landed on his left side.  He has had persistent pain since then.  Worse with certain movements and taking deep breaths.  He does not feel short of breath though.  Denies any acute headaches, neck or back pain.  Past Medical History:  Diagnosis Date  . Cancer ([place]) 2015   rectal  . Chronic back pain   . COPD (chronic obstructive pulmonary disease) ([place])   . GERD (gastroesophageal reflux disease)   . Hematemesis   . Hemorrhoids   . Hypotension   . Melena   . Stroke [facility])     Patient Active Problem List   Diagnosis Date Noted  . Hypertension 03/21/2017  . Generalized anxiety disorder 03/21/2017  . Degenerative disc disease, lumbar 03/17/2017  . Iron deficiency anemia due to chronic blood loss 11/20/2016  . Chronic back pain   . COPD (chronic obstructive pulmonary disease) ([place])   . GERD (gastroesophageal reflux disease)   . Hemorrhoids   . History of rectal cancer 09/13/2015    Past Surgical History:  Procedure Laterality Date  . BACK SURGERY    . ESOPHAGOGASTRODUODENOSCOPY N/A 06/09/2016   Procedure: ESOPHAGOGASTRODUODENOSCOPY (EGD);  Surgeon: Wilford Corner, MD;  Location: [facility];  Service: Endoscopy;  Laterality: N/A;  . ESOPHAGOGASTRODUODENOSCOPY (EGD) WITH PROPOFOL Left 11/21/2016   Procedure: ESOPHAGOGASTRODUODENOSCOPY (EGD) WITH PROPOFOL;  Surgeon: Arta Silence, MD;  Location: [facility] ENDOSCOPY;  Service: Endoscopy;  Laterality: Left;  . HEMORRHOID SURGERY    . HERNIA REPAIR    . RECTAL SURGERY          Home Medications    Prior to Admission medications   Medication Sig Start Date End Date  Taking? Authorizing Provider  amLODipine (NORVASC) 2.5 MG tablet TAKE 1 TABLET DAILY 05/25/17   Dettinger, [name], MD  aspirin EC 81 MG EC tablet Take 1 tablet (81 mg total) by mouth daily. 05/27/17   Geradine Girt, DO  atorvastatin (LIPITOR) 80 MG tablet Take 1 tablet (80 mg total) by mouth daily at 6 PM. 06/09/17   Dettinger, [name], MD  clopidogrel (PLAVIX) 75 MG tablet Take 1 tablet (75 mg total) by mouth daily. 05/26/17   Geradine Girt, DO  HYDROcodone-acetaminophen (NORCO/VICODIN) 5-325 MG tablet Take 1 tablet by mouth every 6 (six) hours as needed. 06/11/17   Virgel Manifold, MD  omeprazole (PRILOSEC) 20 MG capsule Take 1 capsule (20 mg total) by mouth 2 (two) times daily before a meal. 02/09/17   Dettinger, [name], MD  pantoprazole (PROTONIX) 40 MG tablet Take 40 mg by mouth daily.    [provider]  polyethylene glycol powder (MIRALAX) powder Mix 1 capful in 8 ounces of water and drink daily as needed for constipation. 01/15/17   Janora Norlander, DO  sodium chloride (OCEAN) 0.65 % SOLN nasal spray Place 2 sprays as needed into both nostrils for congestion.    [provider]  tetrahydrozoline 0.05 % ophthalmic solution Place 1 drop daily as needed into both eyes (itching).    [provider]  triamcinolone cream (KENALOG) 0.1 % Apply 1  application topically 2 (two) times daily. 04/08/17   Dettinger, [name], MD    Family History Family History  Problem Relation Age of Onset  . COPD Mother   . Stroke Father     Social History Social History   Tobacco Use  . Smoking status: Former Smoker    Start date: 12/30/1964    Last attempt to quit: 06/19/2009    Years since quitting: 7.9  . Smokeless tobacco: Former Systems developer    Types: Snuff    Quit date: 09/26/2005  Substance Use Topics  . Alcohol use: No  . Drug use: No     Allergies   Patient has no known allergies.   Review of Systems Review of Systems  All systems reviewed and negative, other than  as noted in HPI.  Physical Exam Updated Vital Signs BP (!) 160/68   Pulse 65   Temp 97.6 F (36.4 C) (Oral)   Resp 20   Ht 5\' 11"  (1.803 m)   Wt 94.8 kg (209 lb)   SpO2 98%   BMI 29.15 kg/m   Physical Exam  Constitutional: He appears well-developed and well-nourished. No distress.  HENT:  Head: Normocephalic and atraumatic.  Eyes: Conjunctivae are normal. Right eye exhibits no discharge. Left eye exhibits no discharge.  Neck: Neck supple.  Cardiovascular: Normal rate, regular rhythm and normal heart sounds. Exam reveals no gallop and no friction rub.  No murmur heard. Pulmonary/Chest: Effort normal and breath sounds normal. No respiratory distress.  Tenderness to palpation over the left anterior to lateral chest.  No overlying skin changes.  No crepitus.  Symmetric breath sounds bilaterally.  Abdominal: Soft. He exhibits no distension. There is no tenderness.  Musculoskeletal: He exhibits no edema or tenderness.  Neurological: He is alert.  Skin: Skin is warm and dry.  Psychiatric: He has a normal mood and affect. His behavior is normal. Thought content normal.  Nursing note and vitals reviewed.    ED Treatments / Results  Labs (all labs ordered are listed, but only abnormal results are displayed) Labs Reviewed - No data to display  EKG EKG Interpretation  Date/Time:  Saturday June 11 2017 19:21:57 EDT Ventricular Rate:  72 PR Interval:    QRS Duration: 98 QT Interval:  380 QTC Calculation: 416 R Axis:   79 Text Interpretation:  Sinus rhythm Confirmed by Virgel Manifold [id number]) on 06/11/2017 8:15:20 PM   Radiology No results found.   Dg Chest 2 View  Result Date: 06/11/2017 CLINICAL DATA:  Recent fall with chest pain, initial encounter EXAM: CHEST - 2 VIEW COMPARISON:  12/17/2009 FINDINGS: Cardiac shadow is within normal limits. The lungs are well aerated bilaterally with mild increased interstitial changes of a chronic nature. No focal infiltrate or sizable  effusion is seen. No bony abnormality is noted. IMPRESSION: Chronic interstitial changes without acute abnormality. Electronically Signed   By: Inez Catalina M.D.   On: 06/11/2017 20:05   Mr Angiogram Head Wo Contrast  Result Date: 05/25/2017 CLINICAL DATA:  Acute confusion EXAM: MRI HEAD WITHOUT CONTRAST MRA HEAD WITHOUT CONTRAST TECHNIQUE: Multiplanar, multiecho pulse sequences of the brain and surrounding structures were obtained without intravenous contrast. Angiographic images of the head were obtained using MRA technique without contrast. COMPARISON:  CT head 05/25/2017 FINDINGS: MRI HEAD FINDINGS Brain: 8 mm cute infarct left lateral thalamus. No other acute infarct Mild chronic microvascular ischemic changes in the white matter. Negative for hydrocephalus. Negative for hemorrhage or mass. Vascular: Normal arterial flow voids Skull  and upper cervical spine: Negative Sinuses/Orbits: Negative Other: None MRA HEAD FINDINGS Right vertebral artery does not contribute to the basilar. PICA patent bilaterally. Left vertebral artery widely patent. Basilar widely patent. Superior cerebellar and posterior cerebral arteries patent bilaterally. Fetal origin posterior cerebral artery bilaterally. Mild stenosis distal right posterior cerebral artery branches. Mild atherosclerotic disease in the cavernous carotid bilaterally. Anterior and middle cerebral arteries are patent bilaterally. Mild stenosis in the anterior cerebral arteries bilaterally A2 segment. Mild stenosis A1 segment bilaterally. Mild stenosis left middle cerebral artery branches. Right middle cerebral artery branches patent. Negative for aneurysm. IMPRESSION: 8 mm acute infarct left lateral thalamus. Mild chronic microvascular ischemia Occluded distal right vertebral artery. Mild intracranial atherosclerotic disease. Electronically Signed   By: Franchot Gallo M.D.   On: 05/25/2017 15:40   Mr Jodene Nam Neck W Wo Contrast  Result Date: 05/25/2017 CLINICAL  DATA:  Follow-up examination for acute stroke. EXAM: MRA NECK WITHOUT AND WITH CONTRAST TECHNIQUE: Multiplanar and multiecho pulse sequences of the neck were obtained without and with intravenous contrast. Angiographic images of the neck were obtained using MRA technique without and with intravenous contrast. CONTRAST:  26mL MULTIHANCE GADOBENATE DIMEGLUMINE 529 MG/ML IV SOLN COMPARISON:  Prior MRI and MRA of the head from earlier the same day. FINDINGS: Source images reviewed. Partially visualized aortic arch of normal caliber, although the origin of the great vessels are incompletely evaluated on this exam. Visualized subclavian arteries demonstrate mild atherosclerotic change without flow-limiting stenosis. Right common carotid artery patent from its origin to the bifurcation without stenosis. Mild atheromatous irregularity about the right bifurcation without hemodynamically significant stenosis. Right ICA widely patent distally to the skull base without stenosis or occlusion. Visualized left common carotid artery patent from to the bifurcation without flow-limiting stenosis. Atheromatous irregularity about the left bifurcation without hemodynamically significant stenosis. Left ICA widely patent distally to the skull base without stenosis or vascular occlusion. Both of the vertebral arteries arise from the subclavian arteries. Left vertebral artery dominant, with a diffusely hypoplastic right vertebral artery. Visualized portions of the vertebral arteries patent within the neck without hemodynamically significant stenosis or vascular occlusion. Hypoplastic right vertebral artery terminates in PICA. IMPRESSION: 1. Mild atheromatous irregularity about the carotid bifurcations bilaterally without hemodynamically significant stenosis. 2. Patent vertebral arteries within the neck without significant stenosis. Left vertebral artery dominant. Hypoplastic right vertebral artery terminates in PICA. Electronically Signed    By: Jeannine Boga M.D.   On: 05/25/2017 20:43   Mr Brain Wo Contrast  Result Date: 05/25/2017 CLINICAL DATA:  Acute confusion EXAM: MRI HEAD WITHOUT CONTRAST MRA HEAD WITHOUT CONTRAST TECHNIQUE: Multiplanar, multiecho pulse sequences of the brain and surrounding structures were obtained without intravenous contrast. Angiographic images of the head were obtained using MRA technique without contrast. COMPARISON:  CT head 05/25/2017 FINDINGS: MRI HEAD FINDINGS Brain: 8 mm cute infarct left lateral thalamus. No other acute infarct Mild chronic microvascular ischemic changes in the white matter. Negative for hydrocephalus. Negative for hemorrhage or mass. Vascular: Normal arterial flow voids Skull and upper cervical spine: Negative Sinuses/Orbits: Negative Other: None MRA HEAD FINDINGS Right vertebral artery does not contribute to the basilar. PICA patent bilaterally. Left vertebral artery widely patent. Basilar widely patent. Superior cerebellar and posterior cerebral arteries patent bilaterally. Fetal origin posterior cerebral artery bilaterally. Mild stenosis distal right posterior cerebral artery branches. Mild atherosclerotic disease in the cavernous carotid bilaterally. Anterior and middle cerebral arteries are patent bilaterally. Mild stenosis in the anterior cerebral arteries bilaterally A2 segment. Mild stenosis  A1 segment bilaterally. Mild stenosis left middle cerebral artery branches. Right middle cerebral artery branches patent. Negative for aneurysm. IMPRESSION: 8 mm acute infarct left lateral thalamus. Mild chronic microvascular ischemia Occluded distal right vertebral artery. Mild intracranial atherosclerotic disease. Electronically Signed   By: Franchot Gallo M.D.   On: 05/25/2017 15:40   Ct Head Code Stroke Wo Contrast  Result Date: 05/25/2017 CLINICAL DATA:  Code stroke. Confusion and slurred speech. Right-sided deficits. EXAM: CT HEAD WITHOUT CONTRAST TECHNIQUE: Contiguous axial images  were obtained from the base of the skull through the vertex without intravenous contrast. COMPARISON:  10/23/2014 FINDINGS: Brain: No evidence of acute infarction, hemorrhage, hydrocephalus, extra-axial collection or mass lesion/mass effect. Vascular: No hyperdense vessel or unexpected calcification. Skull: Normal. Negative for fracture or focal lesion. Sinuses/Orbits: No acute finding. Other: These results were communicated to Dr. Rory Percy at 2:45 pmon 5/15/2019by text page via the [facility] messaging system. ASPECTS [facility] Stroke Program Early CT Score) - Ganglionic level infarction (caudate, lentiform nuclei, internal capsule, insula, M1-M3 cortex): 7 - Supraganglionic infarction (M4-M6 cortex): 3 Total score (0-10 with 10 being normal): 10 IMPRESSION: Negative head CT.  ASPECTS is 10. Electronically Signed   By: Monte Fantasia M.D.   On: 05/25/2017 14:46    Procedures Procedures (including critical care time)  Medications Ordered in ED Medications  ibuprofen (ADVIL,MOTRIN) tablet 600 mg (600 mg Oral Given 06/11/17 2017)  oxyCODONE-acetaminophen (PERCOCET/ROXICET) 5-325 MG per tablet 1 tablet (1 tablet Oral Given 06/11/17 2017)     Initial Impression / Assessment and Plan / ED Course  I have reviewed the triage vital signs and the nursing notes.  Pertinent labs & imaging results that were available during my care of the patient were reviewed by me and considered in my medical decision making (see chart for details).     67 year old male with left-sided chest pain after mechanical fall.  Likely chest wall contusion.  No distress on exam.  No increased work of breathing.  Imaging negative for acute osseous injury or other significant abnormalities. Symptomatic tx.   Final Clinical Impressions(s) / ED Diagnoses   Final diagnoses:  Contusion of left chest wall, initial encounter    ED Discharge Orders        Ordered    HYDROcodone-acetaminophen (NORCO/VICODIN) 5-325 MG tablet  Every 6 hours PRN      06/11/17 2017       Virgel Manifold, MD 06/15/17 1414

## 2017-06-20 MED ORDER — FLUTICASONE PROPIONATE 50 MCG/ACT NA SUSP: 1.0000 | Freq: Two times a day (BID) | NASAL | 11 refills | Status: DC | PRN

## 2017-06-20 NOTE — Telephone Encounter (Signed)
Yes go ahead and send him in Flonase 1 spray twice daily as needed, refill 11

## 2017-06-20 NOTE — Telephone Encounter (Signed)
Wife states that patient has been having ongoing allergies.  He has been having mucus and some times when he cough it makes him feel like he is choking.  Wife states that they have a friend that uses Flonase and it helps with his allergies. Has tried other nasal sprays and it does not help.  Requesting a rx be sent to [facility]. Please advise

## 2017-06-20 NOTE — Telephone Encounter (Signed)
Rx sent-wife aware.

## 2017-06-20 NOTE — Telephone Encounter (Signed)
What symptoms do you have? Mucus coughing and chokes  How long have you been sick? For a long time  Have you been seen for this problem? no  If your provider decides to give you a prescription, which pharmacy would you like for it to be sent to? [facility]. Pt wants a nasal spray   Patient informed that this information will be sent to the clinical staff for review and that they should receive a follow up call.

## 2017-06-22 ENCOUNTER — Encounter: Payer: Self-pay | Admitting: Adult Health

## 2017-06-22 ENCOUNTER — Ambulatory Visit (INDEPENDENT_AMBULATORY_CARE_PROVIDER_SITE_OTHER): Payer: Medicare Other | Admitting: Adult Health

## 2017-06-22 VITALS — BP 146/76 | HR 64 | Ht 72.0 in | Wt 203.8 lb

## 2017-06-22 DIAGNOSIS — I639 Cerebral infarction, unspecified: Secondary | ICD-10-CM | POA: Diagnosis not present

## 2017-06-22 DIAGNOSIS — I10 Essential (primary) hypertension: Secondary | ICD-10-CM

## 2017-06-22 DIAGNOSIS — E785 Hyperlipidemia, unspecified: Secondary | ICD-10-CM | POA: Diagnosis not present

## 2017-06-22 DIAGNOSIS — I6381 Other cerebral infarction due to occlusion or stenosis of small artery: Secondary | ICD-10-CM

## 2017-06-22 MED ORDER — ASPIRIN EC 325 MG PO TBEC: 325.0000 mg | DELAYED_RELEASE_TABLET | Freq: Every day | ORAL | 3 refills | Status: DC

## 2017-06-22 NOTE — Patient Instructions (Signed)
Start aspirin 325 mg daily  and continue lipitor  for secondary stroke prevention  Stop aspirin 81mg  as we are increasing dose  Continue to follow up with PCP regarding cholesterol and blood pressure management   Continue to monitor blood pressure at home  Continue to stay active and eat healthy  Maintain strict control of hypertension with blood pressure goal below 130/90, diabetes with hemoglobin A1c goal below 6.5% and cholesterol with LDL cholesterol (bad cholesterol) goal below 70 mg/dL. I also advised the patient to eat a healthy diet with plenty of whole grains, cereals, fruits and vegetables, exercise regularly and maintain ideal body weight.  Followup in the future with me in 4 months or call earlier if needed       Thank you for coming to see [place] at [facility]. I hope we have been able to provide you high quality care today.  You may receive a patient satisfaction survey over the next few weeks. We would appreciate your feedback and comments so that we may continue to improve ourselves and the health of our patients.

## 2017-06-22 NOTE — Progress Notes (Signed)
I agree with the above plan 

## 2017-06-22 NOTE — Progress Notes (Signed)
[facility] [address] [place]. [place] [address] [phone]       OFFICE FOLLOW UP NOTE  Mr. SELMER ADDUCI Date of Birth:  25-Feb-1950 Medical Record Number:  [id number]   Reason for Referral:  hospital stroke follow up  CHIEF COMPLAINT:  Chief Complaint  Patient presents with  . Follow-up    Hospital Stroke follow up, Pt saw Dr. Leonie Man in hospital pt in room 9 alone    HPI: KAEO JACOME is being seen today for initial visit in the office for left lateral thalamic lacunar infarct on 05/25/2017. History obtained from patient and chart review. Reviewed all radiology images and labs personally.  Mr. NATHANUEL CABREJA is a 67 y.o. male with history of rectal cancer. Chronic back pain and HTN presenting with slurred speech and confusion.  CT head reviewed and showed no acute stroke.  MRI head reviewed and showed tiny left lateral thalamic lacunar infarct.  MRA head and neck showed occluded distal right VA.  2D echo showed an EF of 60 to 65% without cardiac source of embolus.  LDL 121 as patient was on statin therapy PTA was recommended to start Lipitor 80 mg.  A1c satisfactory at 5.6.  Patient was not on antithrombotic prior to admission and recommended DAPT with aspirin and Plavix for 3 weeks and then aspirin alone.  Therapy recommended outpatient speech and was discharged home in stable condition.  Patient is being seen today for hospital follow-up.  He states all symptoms have resolved and he is back to doing all normal activities without difficulty.  He did undergo speech therapy once discharged but has since completed this.  He continues to take aspirin only without side effects of bleeding or bruising as he is completed Plavix therapy.  He continues Lipitor without side effects of myalgias.  Blood pressure today 146/76.  Denies new or worsening stroke/TIA symptoms.  ROS:   14 system review of systems performed and negative with exception of no complaints  PMH:    Past Medical History:  Diagnosis Date  . Cancer ([place]) 2015   rectal  . Chronic back pain   . COPD (chronic obstructive pulmonary disease) ([place])   . GERD (gastroesophageal reflux disease)   . Hematemesis   . Hemorrhoids   . Hypotension   . Melena   . Stroke [facility])     PSH:  Past Surgical History:  Procedure Laterality Date  . BACK SURGERY    . ESOPHAGOGASTRODUODENOSCOPY N/A 06/09/2016   Procedure: ESOPHAGOGASTRODUODENOSCOPY (EGD);  Surgeon: Wilford Corner, MD;  Location: [place];  Service: Endoscopy;  Laterality: N/A;  . ESOPHAGOGASTRODUODENOSCOPY (EGD) WITH PROPOFOL Left 11/21/2016   Procedure: ESOPHAGOGASTRODUODENOSCOPY (EGD) WITH PROPOFOL;  Surgeon: Arta Silence, MD;  Location: [facility] ENDOSCOPY;  Service: Endoscopy;  Laterality: Left;  . HEMORRHOID SURGERY    . HERNIA REPAIR    . RECTAL SURGERY      Social History:  Social History   Socioeconomic History  . Marital status: Married    Spouse name: Not on file  . Number of children: Not on file  . Years of education: Not on file  . Highest education level: Not on file  Occupational History  . Not on file  Social Needs  . Financial resource strain: Not on file  . Food insecurity:    Worry: Not on file    Inability: Not on file  . Transportation needs:    Medical: Not on file    Non-medical: Not on file  Tobacco Use  . Smoking status: Former Smoker    Start date: 12/30/1964    Last attempt to quit: 06/19/2009    Years since quitting: 8.0  . Smokeless tobacco: Former Systems developer    Types: Snuff    Quit date: 09/26/2005  Substance and Sexual Activity  . Alcohol use: No  . Drug use: No  . Sexual activity: Not on file  Lifestyle  . Physical activity:    Days per week: Not on file    Minutes per session: Not on file  . Stress: Not on file  Relationships  . Social connections:    Talks on phone: Not on file    Gets together: Not on file    Attends religious service: Not on file    Active member of club or  organization: Not on file    Attends meetings of clubs or organizations: Not on file    Relationship status: Not on file  . Intimate partner violence:    Fear of current or ex partner: Not on file    Emotionally abused: Not on file    Physically abused: Not on file    Forced sexual activity: Not on file  Other Topics Concern  . Not on file  Social History Narrative  . Not on file    Family History:  Family History  Problem Relation Age of Onset  . COPD Mother   . Stroke Father     Medications:   Current Outpatient Medications on File Prior to Visit  Medication Sig Dispense Refill  . amLODipine (NORVASC) 2.5 MG tablet TAKE 1 TABLET DAILY 30 tablet 2  . aspirin EC 81 MG EC tablet Take 1 tablet (81 mg total) by mouth daily.    Marland Kitchen atorvastatin (LIPITOR) 80 MG tablet Take 1 tablet (80 mg total) by mouth daily at 6 PM. 90 tablet 0  . fluticasone (FLONASE) 50 MCG/ACT nasal spray Place 1 spray into both nostrils 2 (two) times daily as needed for allergies or rhinitis. 16 g 11  . HYDROcodone-acetaminophen (NORCO/VICODIN) 5-325 MG tablet Take 1 tablet by mouth every 6 (six) hours as needed. 8 tablet 0  . omeprazole (PRILOSEC) 20 MG capsule Take 1 capsule (20 mg total) by mouth 2 (two) times daily before a meal. 60 capsule 3  . polyethylene glycol powder (MIRALAX) powder Mix 1 capful in 8 ounces of water and drink daily as needed for constipation. 255 g 0  . sodium chloride (OCEAN) 0.65 % SOLN nasal spray Place 2 sprays as needed into both nostrils for congestion.    Marland Kitchen tetrahydrozoline 0.05 % ophthalmic solution Place 1 drop daily as needed into both eyes (itching).    . triamcinolone cream (KENALOG) 0.1 % Apply 1 application topically 2 (two) times daily. 454 g 0   No current facility-administered medications on file prior to visit.     Allergies:  No Known Allergies   Physical Exam  Vitals:   06/22/17 1237  BP: (!) 146/76  Pulse: 64  Weight: 203 lb 12.8 oz (92.4 kg)  Height: 6'  (1.829 m)   Body mass index is 27.64 kg/m. No exam data present  General: well developed, pleasant elderly Caucasian male, well nourished, seated, in no evident distress Head: head normocephalic and atraumatic.   Neck: supple with no carotid or supraclavicular bruits Cardiovascular: regular rate and rhythm, no murmurs Musculoskeletal: no deformity Skin:  no rash/petichiae Vascular:  Normal pulses all extremities  Neurologic Exam Mental Status: Awake and fully alert.  Oriented to place and time. Recent and remote memory intact. Attention span, concentration and fund of knowledge appropriate. Mood and affect appropriate.  Cranial Nerves: Fundoscopic exam reveals sharp disc margins. Pupils equal, briskly reactive to light. Extraocular movements full without nystagmus. Visual fields full to confrontation. Hearing intact. Facial sensation intact. Face, tongue, palate moves normally and symmetrically.  Motor: Normal bulk and tone. Normal strength in all tested extremity muscles. Sensory.: intact to touch , pinprick , position and vibratory sensation.  Coordination: Rapid alternating movements normal in all extremities. Finger-to-nose and heel-to-shin performed accurately bilaterally. Gait and Station: Arises from chair without difficulty. Stance is normal. Gait demonstrates normal stride length and balance . Able to heel, toe and tandem walk without difficulty.  Reflexes: 1+ and symmetric. Toes downgoing.    NIHSS  0 Modified Rankin  0   Diagnostic Data (Labs, Imaging, Testing)  CT HEAD WO CONTRAST 05/25/2017 IMPRESSION: Negative head CT.  ASPECTS is 10.  MR BRAIN WO CONTRAST MR MRA HEAD WO CONTRAST MR MRA NECK W WO CONTRAST 05/25/17 IMPRESSION: 8 mm acute infarct left lateral thalamus. Mild chronic microvascular ischemia Occluded distal right vertebral artery. Mild intracranial atherosclerotic disease.  ECHOCARDIOGRAM 05/25/17 Study Conclusions - Left ventricle: The cavity size  was normal. There was mild   concentric hypertrophy. Systolic function was normal. The   estimated ejection fraction was in the range of 60% to 65%. Wall   motion was normal; there were no regional wall motion   abnormalities. There was an increased relative contribution of   atrial contraction to ventricular filling. Doppler parameters are   consistent with abnormal left ventricular relaxation (grade 1   diastolic dysfunction). Doppler parameters are consistent with   high ventricular filling pressure. - Mitral valve: There was mild regurgitation. - Left atrium: The atrium was mildly dilated. - Pulmonary arteries: Systolic pressure could not be accurately   estimated. - Pericardium, extracardiac: A small, free-flowing pericardial   effusion was identified posterior to the heart. The fluid had no   internal echoes.    ASSESSMENT: HUBBERT LANDRIGAN is a 67 y.o. year old male here with left lateral thalamic lacune infarct on 05/25/17 secondary to small vessel disease. Vascular risk factors include HTN and HLD.     PLAN: -Currently aspirin 81 mg but increase this to aspirin 325 due to recent stroke and multiple risk factors and continue Lipitor for secondary stroke prevention -F/u with PCP regarding your HLD and HTN management -continue to monitor BP at home -Maintain strict control of hypertension with blood pressure goal below 130/90, diabetes with hemoglobin A1c goal below 6.5% and cholesterol with LDL cholesterol (bad cholesterol) goal below 70 mg/dL. I also advised the patient to eat a healthy diet with plenty of whole grains, cereals, fruits and vegetables, exercise regularly and maintain ideal body weight.  Follow up in 4 months or call earlier if needed   Greater than 50% of time during this 25 minute visit was spent on counseling,explanation of diagnosis of left lateral thalamic lacunar infarct, reviewing risk factor management of HLD and HTN, planning of further management,  discussion with patient and family and coordination of care    Venancio Poisson, [facility]  [facility] [address] [place], [place] [address]  Phone [phone] Fax [phone]

## 2017-06-23 ENCOUNTER — Ambulatory Visit (INDEPENDENT_AMBULATORY_CARE_PROVIDER_SITE_OTHER): Payer: Medicare Other | Admitting: Family Medicine

## 2017-06-23 ENCOUNTER — Encounter: Payer: Self-pay | Admitting: Family Medicine

## 2017-06-23 VITALS — BP 120/66 | HR 53 | Temp 98.0°F | Ht 72.0 in | Wt 202.0 lb

## 2017-06-23 DIAGNOSIS — M542 Cervicalgia: Secondary | ICD-10-CM

## 2017-06-23 MED ORDER — BACLOFEN 10 MG PO TABS: 10.0000 mg | ORAL_TABLET | Freq: Every evening | ORAL | 1 refills | Status: DC | PRN

## 2017-06-23 NOTE — Progress Notes (Signed)
BP 120/66   Pulse (!) 53   Temp 98 F (36.7 C) (Oral)   Ht 6' (1.829 m)   Wt 202 lb (91.6 kg)   BMI 27.40 kg/m    Subjective:    Patient ID: Todd Berg, male    DOB: 1950/05/25, 67 y.o.   MRN: [id number]  HPI: Todd Berg is a 67 y.o. male presenting on 06/23/2017 for Neck pain and stiffness   HPI Patient comes in complaining of neck pain that is been going on for the past 2 days.  He says is worse at night when he lays down he definitely feels it in the sides of his neck, he says that it feels stiff and he has trouble turning to look to the side as well with his neck.  He denies any specific trauma or injury to it but just that its gotten sore and that it just gets sore sometimes.  He denies any fevers or chills or cough or congestion or rash.  Relevant past medical, surgical, family and social history reviewed and updated as indicated. Interim medical history since our last visit reviewed. Allergies and medications reviewed and updated.  Review of Systems  Constitutional: Negative for chills and fever.  Respiratory: Negative for shortness of breath and wheezing.   Cardiovascular: Negative for chest pain and leg swelling.  Musculoskeletal: Negative for back pain and gait problem.  Skin: Negative for color change and rash.  All other systems reviewed and are negative.   Per HPI unless specifically indicated above   Allergies as of 06/23/2017   No Known Allergies     Medication List        Accurate as of 06/23/17  2:14 PM. Always use your most recent med list.          amLODipine 2.5 MG tablet Commonly known as:  NORVASC TAKE 1 TABLET DAILY   aspirin EC 325 MG tablet Take 1 tablet (325 mg total) by mouth daily.   atorvastatin 80 MG tablet Commonly known as:  LIPITOR Take 1 tablet (80 mg total) by mouth daily at 6 PM.   baclofen 10 MG tablet Commonly known as:  LIORESAL Take 1 tablet (10 mg total) by mouth at bedtime as needed for muscle spasms.     fluticasone 50 MCG/ACT nasal spray Commonly known as:  FLONASE Place 1 spray into both nostrils 2 (two) times daily as needed for allergies or rhinitis.   HYDROcodone-acetaminophen 5-325 MG tablet Commonly known as:  NORCO/VICODIN Take 1 tablet by mouth every 6 (six) hours as needed.   omeprazole 20 MG capsule Commonly known as:  PRILOSEC Take 1 capsule (20 mg total) by mouth 2 (two) times daily before a meal.   polyethylene glycol powder powder Commonly known as:  MIRALAX Mix 1 capful in 8 ounces of water and drink daily as needed for constipation.   sodium chloride 0.65 % Soln nasal spray Commonly known as:  OCEAN Place 2 sprays as needed into both nostrils for congestion.   tetrahydrozoline 0.05 % ophthalmic solution Place 1 drop daily as needed into both eyes (itching).   triamcinolone cream 0.1 % Commonly known as:  KENALOG Apply 1 application topically 2 (two) times daily.          Objective:    BP 120/66   Pulse (!) 53   Temp 98 F (36.7 C) (Oral)   Ht 6' (1.829 m)   Wt 202 lb (91.6 kg)   BMI 27.40 kg/m  Wt Readings from Last 3 Encounters:  06/23/17 202 lb (91.6 kg)  06/22/17 203 lb 12.8 oz (92.4 kg)  06/11/17 209 lb (94.8 kg)    Physical Exam  Constitutional: He is oriented to person, place, and time. He appears well-developed and well-nourished. No distress.  Eyes: Conjunctivae are normal. No scleral icterus.  Cardiovascular: Normal rate, regular rhythm, normal heart sounds and intact distal pulses.  No murmur heard. Pulmonary/Chest: Effort normal and breath sounds normal. No respiratory distress. He has no wheezes.  Musculoskeletal: Normal range of motion. He exhibits tenderness (Bilateral paraspinal muscular tenderness on the neck, no pain extending anywhere else.  Worse with lateral flexion of the neck both directions).  Neurological: He is alert and oriented to person, place, and time. Coordination normal.  Skin: Skin is warm and dry. No rash  noted. He is not diaphoretic.  Psychiatric: He has a normal mood and affect. His behavior is normal.  Nursing note and vitals reviewed.       Assessment & Plan:   Problem List Items Addressed This Visit    None    Visit Diagnoses    Neck pain    -  Primary   Relevant Medications   baclofen (LIORESAL) 10 MG tablet     Likely muscular in origin, will give baclofen, recommended only take at night as needed.  Return if worsens or does not improve  Follow up plan: Return if symptoms worsen or fail to improve.  Counseling provided for all of the vaccine components No orders of the defined types were placed in this encounter.   Caryl Pina, MD [facility] 06/23/2017, 2:14 PM

## 2017-06-29 ENCOUNTER — Ambulatory Visit: Payer: Medicare Other | Admitting: Adult Health

## 2017-07-16 ENCOUNTER — Ambulatory Visit (INDEPENDENT_AMBULATORY_CARE_PROVIDER_SITE_OTHER): Payer: Medicare Other | Admitting: Family

## 2017-07-16 ENCOUNTER — Encounter: Payer: Self-pay | Admitting: Family

## 2017-07-16 VITALS — BP 118/67 | HR 56 | Temp 97.1°F | Ht 72.0 in | Wt 201.0 lb

## 2017-07-16 DIAGNOSIS — N489 Disorder of penis, unspecified: Secondary | ICD-10-CM

## 2017-07-16 DIAGNOSIS — R300 Dysuria: Secondary | ICD-10-CM

## 2017-07-16 MED ORDER — VALACYCLOVIR HCL 1 G PO TABS: 1000.0000 mg | ORAL_TABLET | Freq: Two times a day (BID) | ORAL | 0 refills | Status: DC

## 2017-07-16 NOTE — Progress Notes (Signed)
   Subjective:    Patient ID: Todd Berg, male    DOB: July 01, 1950, 67 y.o.   MRN: [id number]  Chief Complaint  Patient presents with  . Urinary Tract Infection   Pt presents to the office today with dysuria and itching of his penis.  Dysuria   This is a new problem. The current episode started in the past 7 days. The problem occurs every urination. The problem has been gradually worsening. The quality of the pain is described as burning. The pain is at a severity of 5/10. There has been no fever. Associated symptoms include urgency. Pertinent negatives include no discharge, flank pain, frequency, hematuria, nausea or vomiting. Treatments tried: pencillin left from old rx. The treatment provided mild relief.      Review of Systems  Gastrointestinal: Negative for nausea and vomiting.  Genitourinary: Positive for dysuria and urgency. Negative for flank pain, frequency and hematuria.  All other systems reviewed and are negative.      Objective:   Physical Exam  Constitutional: He is oriented to person, place, and time. He appears well-developed and well-nourished. No distress.  HENT:  Head: Normocephalic.  Right Ear: External ear normal.  Left Ear: External ear normal.  Mouth/Throat: Oropharynx is clear and moist.  Eyes: Pupils are equal, round, and reactive to light. Right eye exhibits no discharge. Left eye exhibits no discharge.  Neck: Normal range of motion. Neck supple. No thyromegaly present.  Cardiovascular: Normal rate, regular rhythm, normal heart sounds and intact distal pulses.  No murmur heard. Pulmonary/Chest: Effort normal and breath sounds normal. No respiratory distress. He has no wheezes.  Abdominal: Soft. Bowel sounds are normal. He exhibits no distension. There is no tenderness.  Genitourinary: Penile tenderness: lesion on head of penis.  Musculoskeletal: Normal range of motion. He exhibits no edema or tenderness.  Neurological: He is alert and oriented to  person, place, and time. He has normal reflexes. No cranial nerve deficit.  Skin: Skin is warm and dry. No rash noted. No erythema.  Psychiatric: He has a normal mood and affect. His behavior is normal. Judgment and thought content normal.  Vitals reviewed.     BP 118/67 (BP Location: Left Arm)   Pulse (!) 56   Temp (!) 97.1 F (36.2 C) (Oral)   Ht 6' (1.829 m)   Wt 201 lb (91.2 kg)   BMI 27.26 kg/m      Assessment & Plan:  Todd Berg was seen today for urinary tract infection.  Diagnoses and all orders for this visit:  Dysuria -     Urine Culture -     Urinalysis -     Chlamydia/Gonococcus/Trichomonas, NAA  Penile lesion -     Herpes simplex virus culture -     valACYclovir (VALTREX) 1000 MG tablet; Take 1 tablet (1,000 mg total) by mouth 2 (two) times daily.   Labs pending Lesion looks like herpes, will start Valtrex  Safe sex He denies any new sex partners   Evelina Dun, [place]

## 2017-07-16 NOTE — Patient Instructions (Signed)

## 2017-07-18 LAB — URINALYSIS
Bilirubin, UA: NEGATIVE
Glucose, UA: NEGATIVE
Ketones, UA: NEGATIVE
Leukocytes, UA: NEGATIVE
NITRITE UA: NEGATIVE
PH UA: 5.5 (ref 5.0–7.5)
Protein, UA: NEGATIVE
RBC, UA: NEGATIVE
UUROB: 1 mg/dL (ref 0.2–1.0)

## 2017-07-18 LAB — HERPES SIMPLEX VIRUS CULTURE

## 2017-07-18 LAB — URINE CULTURE: Organism ID, Bacteria: NO GROWTH

## 2017-07-19 ENCOUNTER — Other Ambulatory Visit: Payer: Self-pay | Admitting: Family

## 2017-07-19 ENCOUNTER — Telehealth: Payer: Self-pay | Admitting: Family Medicine

## 2017-07-19 LAB — CHLAMYDIA/GONOCOCCUS/TRICHOMONAS, NAA
Chlamydia by NAA: NEGATIVE
GONOCOCCUS BY NAA: NEGATIVE
TRICH VAG BY NAA: NEGATIVE

## 2017-07-19 MED ORDER — CLOTRIMAZOLE 1 % EX CREA: 1.0000 "application " | TOPICAL_CREAM | Freq: Two times a day (BID) | CUTANEOUS | 0 refills | Status: DC

## 2017-07-19 NOTE — Telephone Encounter (Signed)
Aware.  Cream sent in for yeast.

## 2017-07-21 ENCOUNTER — Telehealth: Payer: Self-pay | Admitting: Family Medicine

## 2017-07-21 NOTE — Telephone Encounter (Signed)
Dizziness ongoing x 1 week when he stands up from a sitting position.  Appointment scheduled tomorrow morning with Dr. Livia Snellen.

## 2017-07-22 ENCOUNTER — Ambulatory Visit: Payer: Medicare Other | Admitting: Family Medicine

## 2017-08-02 ENCOUNTER — Telehealth: Payer: Self-pay | Admitting: Family Medicine

## 2017-08-02 ENCOUNTER — Encounter: Payer: Self-pay | Admitting: Family Medicine

## 2017-08-02 ENCOUNTER — Ambulatory Visit (INDEPENDENT_AMBULATORY_CARE_PROVIDER_SITE_OTHER): Payer: Medicare Other | Admitting: Family Medicine

## 2017-08-02 VITALS — BP 153/83 | HR 53 | Temp 97.0°F | Ht 72.0 in | Wt 204.0 lb

## 2017-08-02 DIAGNOSIS — R200 Anesthesia of skin: Secondary | ICD-10-CM

## 2017-08-02 DIAGNOSIS — R202 Paresthesia of skin: Secondary | ICD-10-CM | POA: Diagnosis not present

## 2017-08-02 DIAGNOSIS — R7303 Prediabetes: Secondary | ICD-10-CM | POA: Diagnosis not present

## 2017-08-02 LAB — BAYER DCA HB A1C WAIVED: HB A1C: 5.8 % (ref <7.0)

## 2017-08-02 NOTE — Telephone Encounter (Signed)
Yes.  He should be following up w/ PCP in 2-4 weeks for BP recheck.  If BP persistently elevated, I do recommend that medication be intensified given hx of CVA.  Will contact tomorrow w/ results of blood draw.

## 2017-08-02 NOTE — Telephone Encounter (Signed)
Returned wife's phone call.  Wife aware of blood drawn and to keep a check on BP

## 2017-08-02 NOTE — Patient Instructions (Signed)
You had labs performed today.  You will be contacted with the results of the labs once they are available, usually in the next 3 business days for routine lab work.  If you had a pap smear or biopsy performed, expect to be contacted in about 7-10 days.   Peripheral Neuropathy Peripheral neuropathy is a type of nerve damage. It affects nerves that carry signals between the spinal cord and other parts of the body. These are called peripheral nerves. With peripheral neuropathy, one nerve or a group of nerves may be damaged. What are the causes? Many things can damage peripheral nerves. For some people with peripheral neuropathy, the cause is unknown. Some causes include:  Diabetes. This is the most common cause of peripheral neuropathy.  Injury to a nerve.  Pressure or stress on a nerve that lasts a long time.  Too little vitamin B. Alcoholism can lead to this.  Infections.  Autoimmune diseases, such as multiple sclerosis and systemic lupus erythematosus.  Inherited nerve diseases.  Some medicines, such as cancer drugs.  Toxic substances, such as lead and mercury.  Too little blood flowing to the legs.  Kidney disease.  Thyroid disease.  What are the signs or symptoms? Different people have different symptoms. The symptoms you have will depend on which of your nerves is damaged. Common symptoms include:  Loss of feeling (numbness) in the feet and hands.  Tingling in the feet and hands.  Pain that burns.  Very sensitive skin.  Weakness.  Not being able to move a part of the body (paralysis).  Muscle twitching.  Clumsiness or poor coordination.  Loss of balance.  Not being able to control your bladder.  Feeling dizzy.  Sexual problems.  How is this diagnosed? Peripheral neuropathy is a symptom, not a disease. Finding the cause of peripheral neuropathy can be hard. To figure that out, your health care provider will take a medical history and do a physical exam.  A neurological exam will also be done. This involves checking things affected by your brain, spinal cord, and nerves (nervous system). For example, your health care provider will check your reflexes, how you move, and what you can feel. Other types of tests may also be ordered, such as:  Blood tests.  A test of the fluid in your spinal cord.  Imaging tests, such as CT scans or an MRI.  Electromyography (EMG). This test checks the nerves that control muscles.  Nerve conduction velocity tests. These tests check how fast messages pass through your nerves.  Nerve biopsy. A small piece of nerve is removed. It is then checked under a microscope.  How is this treated?  Medicine is often used to treat peripheral neuropathy. Medicines may include: ? Pain-relieving medicines. Prescription or over-the-counter medicine may be suggested. ? Antiseizure medicine. This may be used for pain. ? Antidepressants. These also may help ease pain from neuropathy. ? Lidocaine. This is a numbing medicine. You might wear a patch or be given a shot. ? Mexiletine. This medicine is typically used to help control irregular heart rhythms.  Surgery. Surgery may be needed to relieve pressure on a nerve or to destroy a nerve that is causing pain.  Physical therapy to help movement.  Assistive devices to help movement. Follow these instructions at home:  Only take over-the-counter or prescription medicines as directed by your health care provider. Follow the instructions carefully for any given medicines. Do not take any other medicines without first getting approval from your health  care provider.  If you have diabetes, work closely with your health care provider to keep your blood sugar under control.  If you have numbness in your feet: ? Check every day for signs of injury or infection. Watch for redness, warmth, and swelling. ? Wear padded socks and comfortable shoes. These help protect your feet.  Do not do  things that put pressure on your damaged nerve.  Do not smoke. Smoking keeps blood from getting to damaged nerves.  Avoid or limit alcohol. Too much alcohol can cause a lack of B vitamins. These vitamins are needed for healthy nerves.  Develop a good support system. Coping with peripheral neuropathy can be stressful. Talk to a mental health specialist or join a support group if you are struggling.  Follow up with your health care provider as directed. Contact a health care provider if:  You have new signs or symptoms of peripheral neuropathy.  You are struggling emotionally from dealing with peripheral neuropathy.  You have a fever. Get help right away if:  You have an injury or infection that is not healing.  You feel very dizzy or begin vomiting.  You have chest pain.  You have trouble breathing. This information is not intended to replace advice given to you by your health care provider. Make sure you discuss any questions you have with your health care provider. Document Released: 12/18/2001 Document Revised: 06/05/2015 Document Reviewed: 09/04/2012 Elsevier Interactive Patient Education  2017 [employer].

## 2017-08-02 NOTE — Progress Notes (Signed)
Subjective: CC: Feet numbness PCP: Dettinger, [name], MD GBT:DVVOHYW Todd Berg is a 67 y.o. male presenting to clinic today for:  1.  Feet numbness Patient reports onset of bilateral foot numbness about 6 months ago.  He denies any known diabetes.  He states that he used to drink alcohol but has been abstinent from alcohol for many years.  He used to also be a smoker.  Denies any discoloration of the lower extremities.  No joint swelling.  He does note a headache that he had about 3 to 4 days ago but that resolved after aspirin.  Denied any associated visual disturbance, weakness or numbness and tingling elsewhere in the body.  Past medical history is significant for CVA.  Patient is not a strict vegetarian.  ROS: Per HPI  No Known Allergies Past Medical History:  Diagnosis Date  . Cancer ([place]) 2015   rectal  . Chronic back pain   . COPD (chronic obstructive pulmonary disease) ([place])   . GERD (gastroesophageal reflux disease)   . Hematemesis   . Hemorrhoids   . Hypotension   . Melena   . Stroke [facility])     Current Outpatient Medications:  .  amLODipine (NORVASC) 2.5 MG tablet, TAKE 1 TABLET DAILY, Disp: 30 tablet, Rfl: 2 .  aspirin EC 325 MG tablet, Take 1 tablet (325 mg total) by mouth daily., Disp: 90 tablet, Rfl: 3 .  atorvastatin (LIPITOR) 80 MG tablet, Take 1 tablet (80 mg total) by mouth daily at 6 PM., Disp: 90 tablet, Rfl: 0 .  baclofen (LIORESAL) 10 MG tablet, Take 1 tablet (10 mg total) by mouth at bedtime as needed for muscle spasms., Disp: 30 each, Rfl: 1 .  fluticasone (FLONASE) 50 MCG/ACT nasal spray, Place 1 spray into both nostrils 2 (two) times daily as needed for allergies or rhinitis., Disp: 16 g, Rfl: 11 .  HYDROcodone-acetaminophen (NORCO/VICODIN) 5-325 MG tablet, Take 1 tablet by mouth every 6 (six) hours as needed., Disp: 8 tablet, Rfl: 0 .  triamcinolone cream (KENALOG) 0.1 %, Apply 1 application topically 2 (two) times daily., Disp: 454 g, Rfl: 0 .   omeprazole (PRILOSEC) 20 MG capsule, Take 1 capsule (20 mg total) by mouth 2 (two) times daily before a meal., Disp: 60 capsule, Rfl: 3 .  sodium chloride (OCEAN) 0.65 % SOLN nasal spray, Place 2 sprays as needed into both nostrils for congestion., Disp: , Rfl:  .  tetrahydrozoline 0.05 % ophthalmic solution, Place 1 drop daily as needed into both eyes (itching)., Disp: , Rfl:  .  valACYclovir (VALTREX) 1000 MG tablet, Take 1 tablet (1,000 mg total) by mouth 2 (two) times daily. (Patient not taking: Reported on 08/02/2017), Disp: 20 tablet, Rfl: 0 Social History   Socioeconomic History  . Marital status: Married    Spouse name: Not on file  . Number of children: Not on file  . Years of education: Not on file  . Highest education level: Not on file  Occupational History  . Not on file  Social Needs  . Financial resource strain: Not on file  . Food insecurity:    Worry: Not on file    Inability: Not on file  . Transportation needs:    Medical: Not on file    Non-medical: Not on file  Tobacco Use  . Smoking status: Former Smoker    Start date: 12/30/1964    Last attempt to quit: 06/19/2009    Years since quitting: 8.1  . Smokeless tobacco:  Former User    Types: Snuff    Quit date: 09/26/2005  Substance and Sexual Activity  . Alcohol use: No  . Drug use: No  . Sexual activity: Not on file  Lifestyle  . Physical activity:    Days per week: Not on file    Minutes per session: Not on file  . Stress: Not on file  Relationships  . Social connections:    Talks on phone: Not on file    Gets together: Not on file    Attends religious service: Not on file    Active member of club or organization: Not on file    Attends meetings of clubs or organizations: Not on file    Relationship status: Not on file  . Intimate partner violence:    Fear of current or ex partner: Not on file    Emotionally abused: Not on file    Physically abused: Not on file    Forced sexual activity: Not on file    Other Topics Concern  . Not on file  Social History Narrative  . Not on file   Family History  Problem Relation Age of Onset  . COPD Mother   . Stroke Father     Objective: Office vital signs reviewed. BP (!) 153/83   Pulse (!) 53   Temp (!) 97 F (36.1 C) (Oral)   Ht 6' (1.829 m)   Wt 204 lb (92.5 kg)   BMI 27.67 kg/m   Physical Examination:  General: Awake, alert, well nourished, No acute distress HEENT: Normal    Eyes: PERRLA, extraocular membranes intact, sclera white Pulm:normal work of breathing on room air Extremities: warm, well perfused, No edema, cyanosis or clubbing; +2 pulses bilaterally MSK: normal gait and normal station  Foot: Decreased vibratory sense in bilateral feet.  Monofilament sensation intact.  No skin breakdown or ulcerations noted.  Brisk capillary refill appreciated. Skin: dry; intact; no rashes or lesions Neuro: As above.  Assessment/ Plan: 67 y.o. male   1. Numbness and tingling of both feet He actually recently had an A1c which was below 6.  We will plan for recheck given bilateral numbness and tingling.  We will also check TSH and vitamin B12.  He may ultimately need nerve conduction studies if laboratory work-up is negative.  It was difficult to discern how much alcohol he drank in the past.  Differential diagnosis includes undiagnosed type 2 diabetes, vitamin B 12 deficiency, alcohol induced peripheral neuropathy, idiopathic peripheral neuropathy.  Recommended that he follow-up with PCP in about 2 to 4 weeks for recheck of blood pressure, which was elevated today.  I anticipate that his goal BP is less than 140/90 given history of CVA. - Vitamin B12 - Bayer DCA Hb A1c Waived - TSH   Orders Placed This Encounter  Procedures  . Vitamin B12  . Bayer DCA Hb A1c Waived  . TSH    Yanis Larin [place], [place] [phone]

## 2017-08-03 LAB — TSH: TSH: 2.01 u[IU]/mL (ref 0.450–4.500)

## 2017-08-03 LAB — VITAMIN B12: Vitamin B-12: 203 pg/mL — ABNORMAL LOW (ref 232–1245)

## 2017-08-04 ENCOUNTER — Telehealth: Payer: Self-pay | Admitting: Family Medicine

## 2017-08-04 NOTE — Telephone Encounter (Signed)
Pt notified of results Verbalizes understanding 

## 2017-08-05 ENCOUNTER — Ambulatory Visit (INDEPENDENT_AMBULATORY_CARE_PROVIDER_SITE_OTHER): Payer: Medicare Other | Admitting: *Deleted

## 2017-08-05 DIAGNOSIS — E538 Deficiency of other specified B group vitamins: Secondary | ICD-10-CM

## 2017-08-05 MED ORDER — CYANOCOBALAMIN 1000 MCG/ML IJ SOLN
1000.0000 ug | Freq: Once | INTRAMUSCULAR | Status: AC
  Administered 2017-08-05: 1000 ug via INTRAMUSCULAR

## 2017-08-05 NOTE — Patient Instructions (Signed)
Cyanocobalamin, Vitamin B12 injection What is this medicine? CYANOCOBALAMIN (sye an oh koe BAL a min) is a man made form of vitamin B12. Vitamin B12 is used in the growth of healthy blood cells, nerve cells, and proteins in the body. It also helps with the metabolism of fats and carbohydrates. This medicine is used to treat people who can not absorb vitamin B12. This medicine may be used for other purposes; ask your health care provider or pharmacist if you have questions. COMMON BRAND NAME(S): B-12 Compliance Kit, B-12 Injection Kit, Cyomin, LA-12, Nutri-Twelve, Physicians EZ Use B-12, Primabalt What should I tell my health care provider before I take this medicine? They need to know if you have any of these conditions: -kidney disease -Leber's disease -megaloblastic anemia -an unusual or allergic reaction to cyanocobalamin, cobalt, other medicines, foods, dyes, or preservatives -pregnant or trying to get pregnant -breast-feeding How should I use this medicine? This medicine is injected into a muscle or deeply under the skin. It is usually given by a health care professional in a clinic or doctor's office. However, your doctor may teach you how to inject yourself. Follow all instructions. Talk to your pediatrician regarding the use of this medicine in children. Special care may be needed. Overdosage: If you think you have taken too much of this medicine contact a poison control center or emergency room at once. NOTE: This medicine is only for you. Do not share this medicine with others. What if I miss a dose? If you are given your dose at a clinic or doctor's office, call to reschedule your appointment. If you give your own injections and you miss a dose, take it as soon as you can. If it is almost time for your next dose, take only that dose. Do not take double or extra doses. What may interact with this medicine? -colchicine -heavy alcohol intake This list may not describe all possible  interactions. Give your health care provider a list of all the medicines, herbs, non-prescription drugs, or dietary supplements you use. Also tell them if you smoke, drink alcohol, or use illegal drugs. Some items may interact with your medicine. What should I watch for while using this medicine? Visit your doctor or health care professional regularly. You may need blood work done while you are taking this medicine. You may need to follow a special diet. Talk to your doctor. Limit your alcohol intake and avoid smoking to get the best benefit. What side effects may I notice from receiving this medicine? Side effects that you should report to your doctor or health care professional as soon as possible: -allergic reactions like skin rash, itching or hives, swelling of the face, lips, or tongue -blue tint to skin -chest tightness, pain -difficulty breathing, wheezing -dizziness -red, swollen painful area on the leg Side effects that usually do not require medical attention (report to your doctor or health care professional if they continue or are bothersome): -diarrhea -headache This list may not describe all possible side effects. Call your doctor for medical advice about side effects. You may report side effects to FDA at 1-800-FDA-1088. Where should I keep my medicine? Keep out of the reach of children. Store at room temperature between 15 and 30 degrees C (59 and 85 degrees F). Protect from light. Throw away any unused medicine after the expiration date. NOTE: This sheet is a summary. It may not cover all possible information. If you have questions about this medicine, talk to your doctor, pharmacist, or   health care provider.  2018 Elsevier/Gold Standard (2007-04-10 22:10:20)  

## 2017-08-05 NOTE — Progress Notes (Signed)
Vitamin b12 injection given and patient tolerated well.  

## 2017-08-24 ENCOUNTER — Other Ambulatory Visit: Payer: Self-pay | Admitting: Family Medicine

## 2017-08-24 DIAGNOSIS — I10 Essential (primary) hypertension: Secondary | ICD-10-CM

## 2017-09-06 ENCOUNTER — Other Ambulatory Visit: Payer: Self-pay

## 2017-09-06 ENCOUNTER — Encounter: Payer: Self-pay | Admitting: Family Medicine

## 2017-09-06 ENCOUNTER — Ambulatory Visit (INDEPENDENT_AMBULATORY_CARE_PROVIDER_SITE_OTHER): Payer: Medicare Other | Admitting: Family Medicine

## 2017-09-06 VITALS — BP 144/78 | HR 59 | Temp 98.0°F | Ht 72.0 in | Wt 203.0 lb

## 2017-09-06 DIAGNOSIS — Z0000 Encounter for general adult medical examination without abnormal findings: Secondary | ICD-10-CM

## 2017-09-06 NOTE — Progress Notes (Signed)
Subjective:   Todd Berg is a 67 y.o. male who presents for an Initial Medicare Annual Wellness Visit.  Review of Systems  Denies complaints or concerns.      Objective:    Today's Vitals   09/06/17 1012 09/06/17 1013  BP: (!) 156/78 (!) 144/78  Pulse: (!) 59   Temp: 98 F (36.7 C)   TempSrc: Oral   Weight: 203 lb (92.1 kg)   Height: 6' (1.829 m)    Body mass index is 27.53 kg/m.  Advanced Directives 06/07/2017 05/26/2017 05/25/2017 11/20/2016 06/09/2016  Does Patient Have a Medical Advance Directive? No No No No No  Would patient like information on creating a medical advance directive? No - Patient declined No - Patient declined No - Patient declined No - Patient declined No - Patient declined    Current Medications (verified) Outpatient Encounter Medications as of 09/06/2017  Medication Sig  . amLODipine (NORVASC) 2.5 MG tablet TAKE 1 TABLET DAILY  . aspirin EC 325 MG tablet Take 1 tablet (325 mg total) by mouth daily.  Marland Kitchen atorvastatin (LIPITOR) 80 MG tablet Take 1 tablet (80 mg total) by mouth daily at 6 PM.  . fluticasone (FLONASE) 50 MCG/ACT nasal spray Place 1 spray into both nostrils 2 (two) times daily as needed for allergies or rhinitis.  Marland Kitchen HYDROcodone-acetaminophen (NORCO/VICODIN) 5-325 MG tablet Take 1 tablet by mouth every 6 (six) hours as needed.  Marland Kitchen tetrahydrozoline 0.05 % ophthalmic solution Place 1 drop daily as needed into both eyes (itching).  . triamcinolone cream (KENALOG) 0.1 % Apply 1 application topically 2 (two) times daily.  . [DISCONTINUED] omeprazole (PRILOSEC) 20 MG capsule Take 1 capsule (20 mg total) by mouth 2 (two) times daily before a meal.  . [DISCONTINUED] baclofen (LIORESAL) 10 MG tablet Take 1 tablet (10 mg total) by mouth at bedtime as needed for muscle spasms.  . [DISCONTINUED] sodium chloride (OCEAN) 0.65 % SOLN nasal spray Place 2 sprays as needed into both nostrils for congestion.  . [DISCONTINUED] valACYclovir (VALTREX) 1000 MG  tablet Take 1 tablet (1,000 mg total) by mouth 2 (two) times daily. (Patient not taking: Reported on 08/02/2017)   No facility-administered encounter medications on file as of 09/06/2017.     Allergies (verified) Patient has no known allergies.   History: Past Medical History:  Diagnosis Date  . Cancer ([place]) 2015   rectal  . Chronic back pain   . COPD (chronic obstructive pulmonary disease) ([place])   . GERD (gastroesophageal reflux disease)   . Hematemesis   . Hemorrhoids   . Hypotension   . Melena   . Stroke [facility])    Past Surgical History:  Procedure Laterality Date  . BACK SURGERY    . ESOPHAGOGASTRODUODENOSCOPY N/A 06/09/2016   Procedure: ESOPHAGOGASTRODUODENOSCOPY (EGD);  Surgeon: Wilford Corner, MD;  Location: [place];  Service: Endoscopy;  Laterality: N/A;  . ESOPHAGOGASTRODUODENOSCOPY (EGD) WITH PROPOFOL Left 11/21/2016   Procedure: ESOPHAGOGASTRODUODENOSCOPY (EGD) WITH PROPOFOL;  Surgeon: Arta Silence, MD;  Location: [facility] ENDOSCOPY;  Service: Endoscopy;  Laterality: Left;  . HEMORRHOID SURGERY    . HERNIA REPAIR    . RECTAL SURGERY     Family History  Problem Relation Age of Onset  . COPD Mother   . Stroke Father    Social History   Socioeconomic History  . Marital status: Married    Spouse name: Not on file  . Number of children: 5  . Years of education: Not on file  . Highest education  level: Not on file  Occupational History  . Not on file  Social Needs  . Financial resource strain: Not on file  . Food insecurity:    Worry: Never true    Inability: Never true  . Transportation needs:    Medical: No    Non-medical: No  Tobacco Use  . Smoking status: Former Smoker    Start date: 12/30/1964    Last attempt to quit: 06/19/2009    Years since quitting: 8.2  . Smokeless tobacco: Former Systems developer    Types: Snuff    Quit date: 09/26/2005  Substance and Sexual Activity  . Alcohol use: No  . Drug use: No  . Sexual activity: Not on file  Lifestyle  .  Physical activity:    Days per week: 0 days    Minutes per session: 0 min  . Stress: Not at all  Relationships  . Social connections:    Talks on phone: Once a week    Gets together: Never    Attends religious service: Never    Active member of club or organization: No    Attends meetings of clubs or organizations: Never    Relationship status: Married  Other Topics Concern  . Not on file  Social History Narrative   Married, 5 children, several grandchildren   Tobacco Counseling Counseling given: Not Answered   Clinical Intake:     Pain : No/denies pain     Nutritional Status: BMI 25 -29 Overweight Nutritional Risks: None Diabetes: No  How often do you need to have someone help you when you read instructions, pamphlets, or other written materials from your doctor or pharmacy?: 2 - Rarely What is the last grade level you completed in school?: 11th grade  Interpreter Needed?: No  Information entered by :: Todd Pouch, FNP-C  Activities of Daily Living In your present state of health, do you have any difficulty performing the following activities: 09/06/2017 05/26/2017  Hearing? N N  Vision? N N  Difficulty concentrating or making decisions? N N  Walking or climbing stairs? N N  Dressing or bathing? N N  Comment - Prior to hospitalization  Doing errands, shopping? N N  Some recent data might be hidden     Immunizations and Health Maintenance  There is no immunization history on file for this patient. There are no preventive care reminders to display for this patient.  Patient Care Team: Berg, Todd Kaufmann, MD as PCP - General (Family Medicine) Todd Poisson, NP as Nurse Practitioner (Nurse Practitioner)  Indicate any recent Medical Services you may have received from other than [facility] providers in the past year (date may be approximate).    Assessment:   This is a routine wellness examination for [name].  Hearing/Vision screen No exam data  present  Dietary issues and exercise activities discussed:    Goals    . DIET - DECREASE SODA OR JUICE INTAKE    . DIET - INCREASE WATER INTAKE    . Exercise 150 min/wk Moderate Activity      Depression Screen PHQ 2/9 Scores 09/06/2017 09/06/2017 07/16/2017 06/23/2017  PHQ - 2 Score 0 0 0 0    Fall Risk Fall Risk  09/06/2017 09/06/2017 07/16/2017 06/23/2017 06/22/2017  Falls in the past year? Yes Yes Yes Yes Yes  Number falls in past yr: 1 1 1 1 1   Injury with Fall? No No No No -    Is the patient's home free of loose throw rugs in walkways,  pet beds, electrical cords, etc?   no      Grab bars in the bathroom? no      Handrails on the stairs?   yes      Adequate lighting?   yes  Timed Get Up and Go performed: passed  Cognitive Function: MMSE - Mini Mental State Exam 09/06/2017  Orientation to time 5  Orientation to Place 5  Registration 3  Attention/ Calculation 5  Recall 3  Language- name 2 objects 2  Language- repeat 1  Language- follow 3 step command 3  Language- read & follow direction 1  Write a sentence 1  Copy design 1  Total score 30     6CIT Screen 09/06/2017  What Year? 0 points  What month? 0 points  What time? 0 points  Count back from 20 0 points  Months in reverse 0 points  Repeat phrase 0 points  Total Score 0    Screening Tests Health Maintenance  Topic Date Due  . TETANUS/TDAP  02/09/2018 (Originally 12/31/1969)  . PNA vac Low Risk Adult (1 of 2 - PCV13) 02/09/2018 (Originally 12/30/2013)  . INFLUENZA VACCINE  04/26/2018 (Originally 08/11/2017)  . Hepatitis C Screening  09/07/2018 (Originally June 29, 1950)  . COLONOSCOPY  08/11/2025    Qualifies for Shingles Vaccine? declined  Cancer Screenings: Lung: Low Dose CT Chest recommended if Age 53-80 years, 30 pack-year currently smoking OR have quit w/in 15years. Patient does qualify. Declined Colorectal: Due 08/11/2025  Additional Screenings:  Hepatitis C Screening: Declined      Plan:   Diet  and exercise discussed.   I have personally reviewed and noted the following in the patient's chart:   . Medical and social history . Use of alcohol, tobacco or illicit drugs  . Current medications and supplements . Functional ability and status . Nutritional status . Physical activity . Advanced directives . List of other physicians . Hospitalizations, surgeries, and ER visits in previous 12 months . Vitals . Screenings to include cognitive, depression, and falls . Referrals and appointments  In addition, I have reviewed and discussed with patient certain preventive protocols, quality metrics, and best practice recommendations. A written personalized care plan for preventive services as well as general preventive health recommendations were provided to patient.     Todd Berg, [place]   09/06/2017

## 2017-09-06 NOTE — Patient Instructions (Signed)
Exercising to Stay Healthy Exercising regularly is important. It has many health benefits, such as:  Improving your overall fitness, flexibility, and endurance.  Increasing your bone density.  Helping with weight control.  Decreasing your body fat.  Increasing your muscle strength.  Reducing stress and tension.  Improving your overall health.  In order to become healthy and stay healthy, it is recommended that you do moderate-intensity and vigorous-intensity exercise. You can tell that you are exercising at a moderate intensity if you have a higher heart rate and faster breathing, but you are still able to hold a conversation. You can tell that you are exercising at a vigorous intensity if you are breathing much harder and faster and cannot hold a conversation while exercising. How often should I exercise? Choose an activity that you enjoy and set realistic goals. Your health care provider can help you to make an activity plan that works for you. Exercise regularly as directed by your health care provider. This may include:  Doing resistance training twice each week, such as: ? Push-ups. ? Sit-ups. ? Lifting weights. ? Using resistance bands.  Doing a given intensity of exercise for a given amount of time. Choose from these options: ? 150 minutes of moderate-intensity exercise every week. ? 75 minutes of vigorous-intensity exercise every week. ? A mix of moderate-intensity and vigorous-intensity exercise every week.  Children, pregnant women, people who are out of shape, people who are overweight, and older adults may need to consult a health care provider for individual recommendations. If you have any sort of medical condition, be sure to consult your health care provider before starting a new exercise program. What are some exercise ideas? Some moderate-intensity exercise ideas include:  Walking at a rate of 1 mile in 15  minutes.  Biking.  Hiking.  Golfing.  Dancing.  Some vigorous-intensity exercise ideas include:  Walking at a rate of at least 4.5 miles per hour.  Jogging or running at a rate of 5 miles per hour.  Biking at a rate of at least 10 miles per hour.  Lap swimming.  Roller-skating or in-line skating.  Cross-country skiing.  Vigorous competitive sports, such as football, basketball, and soccer.  Jumping rope.  Aerobic dancing.  What are some everyday activities that can help me to get exercise?  Yard work, such as: ? Pushing a lawn mower. ? Raking and bagging leaves.  Washing and waxing your car.  Pushing a stroller.  Shoveling snow.  Gardening.  Washing windows or floors. How can I be more active in my day-to-day activities?  Use the stairs instead of the elevator.  Take a walk during your lunch break.  If you drive, park your car farther away from work or school.  If you take public transportation, get off one stop early and walk the rest of the way.  Make all of your phone calls while standing up and walking around.  Get up, stretch, and walk around every 30 minutes throughout the day. What guidelines should I follow while exercising?  Do not exercise so much that you hurt yourself, feel dizzy, or get very short of breath.  Consult your health care provider before starting a new exercise program.  Wear comfortable clothes and shoes with good support.  Drink plenty of water while you exercise to prevent dehydration or heat stroke. Body water is lost during exercise and must be replaced.  Work out until you breathe faster and your heart beats faster. This information is not   intended to replace advice given to you by your health care provider. Make sure you discuss any questions you have with your health care provider. Document Released: 01/30/2010 Document Revised: 06/05/2015 Document Reviewed: 05/31/2013 Elsevier Interactive Patient Education  2018  [place] Eating Plan DASH stands for "Dietary Approaches to Stop Hypertension." The DASH eating plan is a healthy eating plan that has been shown to reduce high blood pressure (hypertension). It may also reduce your risk for type 2 diabetes, heart disease, and stroke. The DASH eating plan may also help with weight loss. What are tips for following this plan? General guidelines  Avoid eating more than 2,300 mg (milligrams) of salt (sodium) a day. If you have hypertension, you may need to reduce your sodium intake to 1,500 mg a day.  Limit alcohol intake to no more than 1 drink a day for nonpregnant women and 2 drinks a day for men. One drink equals 12 oz of beer, 5 oz of wine, or 1 oz of hard liquor.  Work with your health care provider to maintain a healthy body weight or to lose weight. Ask what an ideal weight is for you.  Get at least 30 minutes of exercise that causes your heart to beat faster (aerobic exercise) most days of the week. Activities may include walking, swimming, or biking.  Work with your health care provider or diet and nutrition specialist (dietitian) to adjust your eating plan to your individual calorie needs. Reading food labels  Check food labels for the amount of sodium per serving. Choose foods with less than 5 percent of the Daily Value of sodium. Generally, foods with less than 300 mg of sodium per serving fit into this eating plan.  To find whole grains, look for the word "whole" as the first word in the ingredient list. Shopping  Buy products labeled as "low-sodium" or "no salt added."  Buy fresh foods. Avoid canned foods and premade or frozen meals. Cooking  Avoid adding salt when cooking. Use salt-free seasonings or herbs instead of table salt or sea salt. Check with your health care provider or pharmacist before using salt substitutes.  Do not fry foods. Cook foods using healthy methods such as baking, boiling, grilling, and broiling  instead.  Cook with heart-healthy oils, such as olive, canola, soybean, or sunflower oil. Meal planning   Eat a balanced diet that includes: ? 5 or more servings of fruits and vegetables each day. At each meal, try to fill half of your plate with fruits and vegetables. ? Up to 6-8 servings of whole grains each day. ? Less than 6 oz of lean meat, poultry, or fish each day. A 3-oz serving of meat is about the same size as a deck of cards. One egg equals 1 oz. ? 2 servings of low-fat dairy each day. ? A serving of nuts, seeds, or beans 5 times each week. ? Heart-healthy fats. Healthy fats called Omega-3 fatty acids are found in foods such as flaxseeds and coldwater fish, like sardines, salmon, and mackerel.  Limit how much you eat of the following: ? Canned or prepackaged foods. ? Food that is high in trans fat, such as fried foods. ? Food that is high in saturated fat, such as fatty meat. ? Sweets, desserts, sugary drinks, and other foods with added sugar. ? Full-fat dairy products.  Do not salt foods before eating.  Try to eat at least 2 vegetarian meals each week.  Eat more home-cooked food and less restaurant,  buffet, and fast food.  When eating at a restaurant, ask that your food be prepared with less salt or no salt, if possible. What foods are recommended? The items listed may not be a complete list. Talk with your dietitian about what dietary choices are best for you. Grains Whole-grain or whole-wheat bread. Whole-grain or whole-wheat pasta. Brown rice. [place]. Bulgur. Whole-grain and low-sodium cereals. Pita bread. Low-fat, low-sodium crackers. Whole-wheat flour tortillas. Vegetables Fresh or frozen vegetables (raw, steamed, roasted, or grilled). Low-sodium or reduced-sodium tomato and vegetable juice. Low-sodium or reduced-sodium tomato sauce and tomato paste. Low-sodium or reduced-sodium canned vegetables. Fruits All fresh, dried, or frozen fruit. Canned fruit in  natural juice (without added sugar). Meat and other protein foods Skinless chicken or [place]. Ground chicken or [place]. Pork with fat trimmed off. Fish and seafood. Egg whites. Dried beans, peas, or lentils. Unsalted nuts, nut butters, and seeds. Unsalted canned beans. Lean cuts of beef with fat trimmed off. Low-sodium, lean deli meat. Dairy Low-fat (1%) or fat-free (skim) milk. Fat-free, low-fat, or reduced-fat cheeses. Nonfat, low-sodium ricotta or cottage cheese. Low-fat or nonfat yogurt. Low-fat, low-sodium cheese. Fats and oils Soft margarine without trans fats. Vegetable oil. Low-fat, reduced-fat, or light mayonnaise and salad dressings (reduced-sodium). Canola, safflower, olive, soybean, and sunflower oils. Avocado. Seasoning and other foods Herbs. Spices. Seasoning mixes without salt. Unsalted popcorn and pretzels. Fat-free sweets. What foods are not recommended? The items listed may not be a complete list. Talk with your dietitian about what dietary choices are best for you. Grains Baked goods made with fat, such as croissants, muffins, or some breads. Dry pasta or rice meal packs. Vegetables Creamed or fried vegetables. Vegetables in a cheese sauce. Regular canned vegetables (not low-sodium or reduced-sodium). Regular canned tomato sauce and paste (not low-sodium or reduced-sodium). Regular tomato and vegetable juice (not low-sodium or reduced-sodium). Angie Fava. Olives. Fruits Canned fruit in a light or heavy syrup. Fried fruit. Fruit in cream or butter sauce. Meat and other protein foods Fatty cuts of meat. Ribs. Fried meat. Berniece Salines. Sausage. Bologna and other processed lunch meats. Salami. Fatback. Hotdogs. Bratwurst. Salted nuts and seeds. Canned beans with added salt. Canned or smoked fish. Whole eggs or egg yolks. Chicken or [place] with skin. Dairy Whole or 2% milk, cream, and half-and-half. Whole or full-fat cream cheese. Whole-fat or sweetened yogurt. Full-fat cheese. Nondairy  creamers. Whipped toppings. Processed cheese and cheese spreads. Fats and oils Butter. Stick margarine. Lard. Shortening. Ghee. Bacon fat. Tropical oils, such as coconut, palm kernel, or palm oil. Seasoning and other foods Salted popcorn and pretzels. Onion salt, garlic salt, seasoned salt, table salt, and sea salt. Worcestershire sauce. Tartar sauce. Barbecue sauce. Teriyaki sauce. Soy sauce, including reduced-sodium. Steak sauce. Canned and packaged gravies. Fish sauce. Oyster sauce. Cocktail sauce. Horseradish that you find on the shelf. Ketchup. Mustard. Meat flavorings and tenderizers. Bouillon cubes. Hot sauce and Tabasco sauce. Premade or packaged marinades. Premade or packaged taco seasonings. Relishes. Regular salad dressings. Where to find more information:  National Heart, Lung, and [place]: https://wilson-eaton.com/  American Heart Association: www.heart.org Summary  The DASH eating plan is a healthy eating plan that has been shown to reduce high blood pressure (hypertension). It may also reduce your risk for type 2 diabetes, heart disease, and stroke.  With the DASH eating plan, you should limit salt (sodium) intake to 2,300 mg a day. If you have hypertension, you may need to reduce your sodium intake to 1,500 mg a day.  When  on the DASH eating plan, aim to eat more fresh fruits and vegetables, whole grains, lean proteins, low-fat dairy, and heart-healthy fats.  Work with your health care provider or diet and nutrition specialist (dietitian) to adjust your eating plan to your individual calorie needs. This information is not intended to replace advice given to you by your health care provider. Make sure you discuss any questions you have with your health care provider. Document Released: 12/17/2010 Document Revised: 12/22/2015 Document Reviewed: 12/22/2015 Elsevier Interactive Patient Education  [employer].

## 2017-09-22 ENCOUNTER — Other Ambulatory Visit: Payer: Self-pay

## 2017-09-22 ENCOUNTER — Encounter (HOSPITAL_COMMUNITY): Payer: Self-pay | Admitting: Emergency Medicine

## 2017-09-22 ENCOUNTER — Ambulatory Visit: Payer: Medicare Other | Admitting: Family Medicine

## 2017-09-22 ENCOUNTER — Emergency Department (HOSPITAL_COMMUNITY)
Admission: EM | Admit: 2017-09-22 | Discharge: 2017-09-22 | Disposition: A | Discharge status: Home / Self Care | Payer: Medicare Other | Attending: Emergency Medicine | Admitting: Emergency Medicine

## 2017-09-22 DIAGNOSIS — Z87891 Personal history of nicotine dependence: Secondary | ICD-10-CM | POA: Insufficient documentation

## 2017-09-22 DIAGNOSIS — Z7982 Long term (current) use of aspirin: Secondary | ICD-10-CM | POA: Diagnosis not present

## 2017-09-22 DIAGNOSIS — J449 Chronic obstructive pulmonary disease, unspecified: Secondary | ICD-10-CM | POA: Insufficient documentation

## 2017-09-22 DIAGNOSIS — Z79899 Other long term (current) drug therapy: Secondary | ICD-10-CM | POA: Insufficient documentation

## 2017-09-22 DIAGNOSIS — Z85048 Personal history of other malignant neoplasm of rectum, rectosigmoid junction, and anus: Secondary | ICD-10-CM | POA: Insufficient documentation

## 2017-09-22 DIAGNOSIS — M542 Cervicalgia: Secondary | ICD-10-CM | POA: Insufficient documentation

## 2017-09-22 DIAGNOSIS — I10 Essential (primary) hypertension: Secondary | ICD-10-CM | POA: Insufficient documentation

## 2017-09-22 MED ORDER — IBUPROFEN 600 MG PO TABS: 600.0000 mg | ORAL_TABLET | Freq: Four times a day (QID) | ORAL | 0 refills | Status: DC | PRN

## 2017-09-22 MED ORDER — METHOCARBAMOL 500 MG PO TABS: 500.0000 mg | ORAL_TABLET | Freq: Two times a day (BID) | ORAL | 0 refills | Status: DC | PRN

## 2017-09-22 NOTE — ED Triage Notes (Signed)
PT c/o recurrent neck pain worsening over the past few weeks. Wife reports pt had PCP appt at 1500 today but pain worsened and they decided to come to ED. PT denies any new injury or falls.

## 2017-09-22 NOTE — Discharge Instructions (Signed)
It is likely that your pain is related to a strained neck muscle, at this time I would recommend that you undergo treatment with both ibuprofen 3 times a day 600 mg per dose and Robaxin, 500 mg, you may take 2 tablets of Robaxin every 12 hours as needed.  You may find additional relief with warm compresses however if you find any numbness or weakness of your arms, any severe or worsening pain or any severe or worsening symptoms you should return to the emergency department immediately.  Otherwise follow-up with your doctor in 2 to 3 days for recheck.

## 2017-09-22 NOTE — ED Provider Notes (Signed)
[facility] EMERGENCY DEPARTMENT Provider Note   CSN: [id number] Arrival date & time: 09/22/17  1051     History   Chief Complaint Chief Complaint  Patient presents with  . Neck Pain    HPI Todd Berg is a 67 y.o. male.   Neck Pain      Neck pain 3 days ago - progressive pain over 3 days - sharp and stabbing, constant.  Had appt with PCP at 3 PM - decided to come here instead - had recent stroke, no recent trauma / strenuous activity - he had some headache with this (chronic).  No f/c/n/v, no CP and no blurred vision.  The pain is located R lateral - worse with movement, no pain with flexion / extention of the neck.  Denies radiation of the pain into the arms and denies n/w into the arms.    Past Medical History:  Diagnosis Date  . Cancer ([place]) 2015   rectal  . Chronic back pain   . COPD (chronic obstructive pulmonary disease) ([place])   . GERD (gastroesophageal reflux disease)   . Hematemesis   . Hemorrhoids   . Hypotension   . Melena   . Stroke [facility])     Patient Active Problem List   Diagnosis Date Noted  . Hypertension 03/21/2017  . Generalized anxiety disorder 03/21/2017  . Degenerative disc disease, lumbar 03/17/2017  . Iron deficiency anemia due to chronic blood loss 11/20/2016  . Chronic back pain   . COPD (chronic obstructive pulmonary disease) ([place])   . GERD (gastroesophageal reflux disease)   . Hemorrhoids   . History of rectal cancer 09/13/2015    Past Surgical History:  Procedure Laterality Date  . BACK SURGERY    . ESOPHAGOGASTRODUODENOSCOPY N/A 06/09/2016   Procedure: ESOPHAGOGASTRODUODENOSCOPY (EGD);  Surgeon: Wilford Corner, MD;  Location: [place];  Service: Endoscopy;  Laterality: N/A;  . ESOPHAGOGASTRODUODENOSCOPY (EGD) WITH PROPOFOL Left 11/21/2016   Procedure: ESOPHAGOGASTRODUODENOSCOPY (EGD) WITH PROPOFOL;  Surgeon: Arta Silence, MD;  [facility] ENDOSCOPY;  Service: Endoscopy;  Laterality: Left;  . HEMORRHOID SURGERY    .  HERNIA REPAIR    . RECTAL SURGERY          Home Medications    Prior to Admission medications   Medication Sig Start Date End Date Taking? Authorizing Provider  amLODipine (NORVASC) 2.5 MG tablet TAKE 1 TABLET DAILY 08/24/17   Dettinger, [name], MD  aspirin EC 325 MG tablet Take 1 tablet (325 mg total) by mouth daily. 06/22/17   Venancio Poisson, NP  atorvastatin (LIPITOR) 80 MG tablet Take 1 tablet (80 mg total) by mouth daily at 6 PM. 06/09/17   Dettinger, [name], MD  fluticasone (FLONASE) 50 MCG/ACT nasal spray Place 1 spray into both nostrils 2 (two) times daily as needed for allergies or rhinitis. 06/20/17   Dettinger, [name], MD  HYDROcodone-acetaminophen (NORCO/VICODIN) 5-325 MG tablet Take 1 tablet by mouth every 6 (six) hours as needed. 06/11/17   Virgel Manifold, MD  ibuprofen (ADVIL,MOTRIN) 600 MG tablet Take 1 tablet (600 mg total) by mouth every 6 (six) hours as needed. 09/22/17   Noemi Chapel, MD  methocarbamol (ROBAXIN) 500 MG tablet Take 1 tablet (500 mg total) by mouth 2 (two) times daily as needed for muscle spasms. 09/22/17   Noemi Chapel, MD  tetrahydrozoline 0.05 % ophthalmic solution Place 1 drop daily as needed into both eyes (itching).    [provider]  triamcinolone cream (KENALOG) 0.1 % Apply 1  application topically 2 (two) times daily. 04/08/17   Dettinger, [name], MD    Family History Family History  Problem Relation Age of Onset  . COPD Mother   . Stroke Father     Social History Social History   Tobacco Use  . Smoking status: Former Smoker    Start date: 12/30/1964    Last attempt to quit: 06/19/2009    Years since quitting: 8.2  . Smokeless tobacco: Former Systems developer    Types: Snuff    Quit date: 09/26/2005  Substance Use Topics  . Alcohol use: No  . Drug use: No     Allergies   Patient has no known allergies.   Review of Systems Review of Systems  Musculoskeletal: Positive for neck pain.  All other systems reviewed and are  negative.    Physical Exam Updated Vital Signs BP (!) 168/86   Pulse (!) 56   Temp 97.8 F (36.6 C) (Oral)   Resp (!) 23   Ht 1.854 m (6\' 1" )   Wt 89.8 kg   SpO2 97%   BMI 26.12 kg/m   Physical Exam  Constitutional: He appears well-developed and well-nourished. No distress.  HENT:  Head: Normocephalic and atraumatic.  Mouth/Throat: Oropharynx is clear and moist. No oropharyngeal exudate.  Eyes: Pupils are equal, round, and reactive to light. Conjunctivae and EOM are normal. Right eye exhibits no discharge. Left eye exhibits no discharge. No scleral icterus.  Neck: Normal range of motion. Neck supple. No JVD present. No thyromegaly present.  ttp not reproducible on exam to palpation - elicited with rotational pain towards the R shoulder.  No carotid bruit  Cardiovascular: Normal rate, regular rhythm, normal heart sounds and intact distal pulses. Exam reveals no gallop and no friction rub.  No murmur heard. Pulmonary/Chest: Effort normal and breath sounds normal. No respiratory distress. He has no wheezes. He has no rales.  Abdominal: Soft. Bowel sounds are normal. He exhibits no distension and no mass. There is no tenderness.  Musculoskeletal: Normal range of motion. He exhibits tenderness. He exhibits no edema.  The patient has tenderness over the sternocleidomastoid with contralateral rotation of the head forced against resistance.  Lymphadenopathy:    He has no cervical adenopathy.  Neurological: He is alert. Coordination normal.  CN 3-12,  Normal strength / sensation in the UE's bilaterally.  Skin: Skin is warm and dry. No rash noted. No erythema.  Psychiatric: He has a normal mood and affect. His behavior is normal.  Nursing note and vitals reviewed.    ED Treatments / Results  Labs (all labs ordered are listed, but only abnormal results are displayed) Labs Reviewed - No data to display  EKG None  Radiology No results found.  Procedures Procedures (including  critical care time)  Medications Ordered in ED Medications - No data to display   Initial Impression / Assessment and Plan / ED Course  I have reviewed the triage vital signs and the nursing notes.  Pertinent labs & imaging results that were available during my care of the patient were reviewed by me and considered in my medical decision making (see chart for details).     Though the patient has a history of rectal cancer he denies that he had any surgery and on exam there is no surgical scars, his medication list suggest that he is on medicines for pain, cholesterol and blood pressure his exam is consistent with having a muscular strain of the neck, at this time I  would recommend conservative therapy with anti-inflammatories and a muscle relaxer with close follow-up with his family doctor, the patient is in agreement with this plan.  Precautions for worsening pain given.  Final Clinical Impressions(s) / ED Diagnoses   Final diagnoses:  Neck pain    ED Discharge Orders         Ordered    methocarbamol (ROBAXIN) 500 MG tablet  2 times daily PRN     09/22/17 1343    ibuprofen (ADVIL,MOTRIN) 600 MG tablet  Every 6 hours PRN     09/22/17 1343           Noemi Chapel, MD 09/22/17 1344

## 2017-10-06 ENCOUNTER — Encounter: Payer: Self-pay | Admitting: Family Medicine

## 2017-10-06 ENCOUNTER — Ambulatory Visit (INDEPENDENT_AMBULATORY_CARE_PROVIDER_SITE_OTHER): Payer: Medicare Other | Admitting: Family Medicine

## 2017-10-06 VITALS — BP 135/71 | HR 64 | Temp 98.0°F | Ht 73.0 in | Wt 199.0 lb

## 2017-10-06 DIAGNOSIS — S161XXD Strain of muscle, fascia and tendon at neck level, subsequent encounter: Secondary | ICD-10-CM

## 2017-10-06 DIAGNOSIS — S161XXA Strain of muscle, fascia and tendon at neck level, initial encounter: Secondary | ICD-10-CM | POA: Diagnosis not present

## 2017-10-06 MED ORDER — TIZANIDINE HCL 2 MG PO TABS: 2.0000 mg | ORAL_TABLET | Freq: Four times a day (QID) | ORAL | 0 refills | Status: DC | PRN

## 2017-10-06 MED ORDER — DICLOFENAC SODIUM 1 % TD GEL: 2.0000 g | Freq: Four times a day (QID) | TRANSDERMAL | 1 refills | Status: DC

## 2017-10-06 NOTE — Patient Instructions (Signed)
Cervical Strain and Sprain Rehab Ask your health care provider which exercises are safe for you. Do exercises exactly as told by your health care provider and adjust them as directed. It is normal to feel mild stretching, pulling, tightness, or discomfort as you do these exercises, but you should stop right away if you feel sudden pain or your pain gets worse.Do not begin these exercises until told by your health care provider. Stretching and range of motion exercises These exercises warm up your muscles and joints and improve the movement and flexibility of your neck. These exercises also help to relieve pain, numbness, and tingling. Exercise A: Cervical side bend  1. Using good posture, sit on a stable chair or stand up. 2. Without moving your shoulders, slowly tilt your left / right ear to your shoulder until you feel a stretch in your neck muscles. You should be looking straight ahead. 3. Hold for __________ seconds. 4. Repeat with the other side of your neck. Repeat __________ times. Complete this exercise __________ times a day. Exercise B: Cervical rotation  1. Using good posture, sit on a stable chair or stand up. 2. Slowly turn your head to the side as if you are looking over your left / right shoulder. ? Keep your eyes level with the ground. ? Stop when you feel a stretch along the side and the back of your neck. 3. Hold for __________ seconds. 4. Repeat this by turning to your other side. Repeat __________ times. Complete this exercise __________ times a day. Exercise C: Thoracic extension and pectoral stretch 1. Roll a towel or a small blanket so it is about 4 inches (10 cm) in diameter. 2. Lie down on your back on a firm surface. 3. Put the towel lengthwise, under your spine in the middle of your back. It should not be not under your shoulder blades. The towel should line up with your spine from your middle back to your lower back. 4. Put your hands behind your head and let your  elbows fall out to your sides. 5. Hold for __________ seconds. Repeat __________ times. Complete this exercise __________ times a day. Strengthening exercises These exercises build strength and endurance in your neck. Endurance is the ability to use your muscles for a long time, even after your muscles get tired. Exercise D: Upper cervical flexion, isometric 1. Lie on your back with a thin pillow behind your head and a small rolled-up towel under your neck. 2. Gently tuck your chin toward your chest and nod your head down to look toward your feet. Do not lift your head off the pillow. 3. Hold for __________ seconds. 4. Release the tension slowly. Relax your neck muscles completely before you repeat this exercise. Repeat __________ times. Complete this exercise __________ times a day. Exercise E: Cervical extension, isometric  1. Stand about 6 inches (15 cm) away from a wall, with your back facing the wall. 2. Place a soft object, about 6-8 inches (15-20 cm) in diameter, between the back of your head and the wall. A soft object could be a small pillow, a ball, or a folded towel. 3. Gently tilt your head back and press into the soft object. Keep your jaw and forehead relaxed. 4. Hold for __________ seconds. 5. Release the tension slowly. Relax your neck muscles completely before you repeat this exercise. Repeat __________ times. Complete this exercise __________ times a day. Posture and body mechanics  Body mechanics refers to the movements and positions of   your body while you do your daily activities. Posture is part of body mechanics. Good posture and healthy body mechanics can help to relieve stress in your body's tissues and joints. Good posture means that your spine is in its natural S-curve position (your spine is neutral), your shoulders are pulled back slightly, and your head is not tipped forward. The following are general guidelines for applying improved posture and body mechanics to  your everyday activities. Standing  When standing, keep your spine neutral and keep your feet about hip-width apart. Keep a slight bend in your knees. Your ears, shoulders, and hips should line up.  When you do a task in which you stand in one place for a long time, place one foot up on a stable object that is 2-4 inches (5-10 cm) high, such as a footstool. This helps keep your spine neutral. Sitting   When sitting, keep your spine neutral and your keep feet flat on the floor. Use a footrest, if necessary, and keep your thighs parallel to the floor. Avoid rounding your shoulders, and avoid tilting your head forward.  When working at a desk or a computer, keep your desk at a height where your hands are slightly lower than your elbows. Slide your chair under your desk so you are close enough to maintain good posture.  When working at a computer, place your monitor at a height where you are looking straight ahead and you do not have to tilt your head forward or downward to look at the screen. Resting When lying down and resting, avoid positions that are most painful for you. Try to support your neck in a neutral position. You can use a contour pillow or a small rolled-up towel. Your pillow should support your neck but not push on it. This information is not intended to replace advice given to you by your health care provider. Make sure you discuss any questions you have with your health care provider. Document Released: 12/28/2004 Document Revised: 09/04/2015 Document Reviewed: 12/04/2014 Elsevier Interactive Patient Education  2018 [employer]vier Inc.  

## 2017-10-06 NOTE — Progress Notes (Signed)
Subjective:    Patient ID: Todd Berg, male    DOB: [birth year], 67 y.o.   MRN: [id number]  Chief Complaint:  Neck Pain (side of neck, left side, ongoing for "awhile" but worsening, feels stiff; went to ER on 09/22/17 and was given Robaxin and Ibuprofen "cannot remember if it helped")   HPI: Todd Berg is a 67 y.o. male presenting on 10/06/2017 for Neck Pain (side of neck, left side, ongoing for "awhile" but worsening, feels stiff; went to ER on 09/22/17 and was given Robaxin and Ibuprofen "cannot remember if it helped")   1. Cervical muscle strain, subsequent encounter   Pt presents today for ongoing neck pain. States this started several months ago and is worse at times. States he has been working out in the yard more over the last few weeks and the pain has become worse. States it hurts to turn his neck from side to side. States the pain is tight and worse at night. Unable to quantify pain. States he was seen in the ED at [facility] ED on 09/22/17 and prescribed motrin and robaxin. Pt states he took these for a few days and felt better. He denies fever, chills, numbness, tingling, loss of function, or injury.    Relevant past medical, surgical, family and social history reviewed and updated as indicated. Interim medical history since our last visit reviewed. Allergies and medications reviewed and updated. DATA REVIEWED: CHART IN EPIC  Family History reviewed for pertinent findings.  Past Medical History:  Diagnosis Date  . Cancer ([place]) 2015   rectal  . Chronic back pain   . COPD (chronic obstructive pulmonary disease) ([place])   . GERD (gastroesophageal reflux disease)   . Hematemesis   . Hemorrhoids   . Hypotension   . Melena   . Stroke [facility])     Past Surgical History:  Procedure Laterality Date  . BACK SURGERY    . ESOPHAGOGASTRODUODENOSCOPY N/A 06/09/2016   Procedure: ESOPHAGOGASTRODUODENOSCOPY (EGD);  Surgeon: Wilford Corner, MD;  Location: [facility];   Service: Endoscopy;  Laterality: N/A;  . ESOPHAGOGASTRODUODENOSCOPY (EGD) WITH PROPOFOL Left 11/21/2016   Procedure: ESOPHAGOGASTRODUODENOSCOPY (EGD) WITH PROPOFOL;  Surgeon: Arta Silence, MD;  Location: [facility] ENDOSCOPY;  Service: Endoscopy;  Laterality: Left;  . HEMORRHOID SURGERY    . HERNIA REPAIR    . RECTAL SURGERY      Social History   Socioeconomic History  . Marital status: Married    Spouse name: Not on file  . Number of children: 5  . Years of education: Not on file  . Highest education level: Not on file  Occupational History  . Not on file  Social Needs  . Financial resource strain: Not on file  . Food insecurity:    Worry: Never true    Inability: Never true  . Transportation needs:    Medical: No    Non-medical: No  Tobacco Use  . Smoking status: Former Smoker    Start date: 12/30/1964    Last attempt to quit: 06/19/2009    Years since quitting: 8.3  . Smokeless tobacco: Former Systems developer    Types: Snuff    Quit date: 09/26/2005  Substance and Sexual Activity  . Alcohol use: No  . Drug use: No  . Sexual activity: Not on file  Lifestyle  . Physical activity:    Days per week: 0 days    Minutes per session: 0 min  . Stress: Not at all  Relationships  .  Social connections:    Talks on phone: Once a week    Gets together: Never    Attends religious service: Never    Active member of club or organization: No    Attends meetings of clubs or organizations: Never    Relationship status: Married  . Intimate partner violence:    Fear of current or ex partner: Not on file    Emotionally abused: Not on file    Physically abused: Not on file    Forced sexual activity: Not on file  Other Topics Concern  . Not on file  Social History Narrative   Married, 5 children, several grandchildren    Outpatient Encounter Medications as of 10/06/2017  Medication Sig  . amLODipine (NORVASC) 2.5 MG tablet TAKE 1 TABLET DAILY  . aspirin EC 325 MG tablet Take 1 tablet (325 mg  total) by mouth daily.  Marland Kitchen atorvastatin (LIPITOR) 80 MG tablet Take 1 tablet (80 mg total) by mouth daily at 6 PM.  . fluticasone (FLONASE) 50 MCG/ACT nasal spray Place 1 spray into both nostrils 2 (two) times daily as needed for allergies or rhinitis.  Marland Kitchen tetrahydrozoline 0.05 % ophthalmic solution Place 1 drop daily as needed into both eyes (itching).  . triamcinolone cream (KENALOG) 0.1 % Apply 1 application topically 2 (two) times daily.  . diclofenac sodium (VOLTAREN) 1 % GEL Apply 2 g topically 4 (four) times daily.  Marland Kitchen tiZANidine (ZANAFLEX) 2 MG tablet Take 1 tablet (2 mg total) by mouth every 6 (six) hours as needed for muscle spasms.  . [DISCONTINUED] HYDROcodone-acetaminophen (NORCO/VICODIN) 5-325 MG tablet Take 1 tablet by mouth every 6 (six) hours as needed.  . [DISCONTINUED] ibuprofen (ADVIL,MOTRIN) 600 MG tablet Take 1 tablet (600 mg total) by mouth every 6 (six) hours as needed.  . [DISCONTINUED] methocarbamol (ROBAXIN) 500 MG tablet Take 1 tablet (500 mg total) by mouth 2 (two) times daily as needed for muscle spasms.   No facility-administered encounter medications on file as of 10/06/2017.     No Known Allergies  Review of Systems  Constitutional: Negative for activity change, appetite change, chills, fatigue and fever.  HENT: Negative.   Eyes: Negative.   Respiratory: Negative for cough, chest tightness and shortness of breath.   Cardiovascular: Negative for chest pain, palpitations and leg swelling.  Gastrointestinal: Negative for blood in stool, constipation, diarrhea, nausea and vomiting.  Endocrine: Negative.   Genitourinary: Negative for dysuria, frequency and urgency.  Musculoskeletal: Positive for neck pain and neck stiffness. Negative for arthralgias and myalgias.  Skin: Negative.   Allergic/Immunologic: Negative.   Neurological: Negative for dizziness, weakness, numbness and headaches.  Hematological: Negative.   Psychiatric/Behavioral: Negative for confusion,  hallucinations, sleep disturbance and suicidal ideas.  All other systems reviewed and are negative.       Objective:    BP 135/71   Pulse 64   Temp 98 F (36.7 C) (Oral)   Ht 6\' 1"  (1.854 m)   Wt 199 lb (90.3 kg)   BMI 26.25 kg/m    Wt Readings from Last 3 Encounters:  10/06/17 199 lb (90.3 kg)  09/22/17 198 lb (89.8 kg)  09/06/17 203 lb (92.1 kg)    Physical Exam  Constitutional: He is oriented to person, place, and time. He appears well-developed and well-nourished. No distress.  HENT:  Head: Normocephalic and atraumatic.  Eyes: Pupils are equal, round, and reactive to light. Conjunctivae and EOM are normal.  Neck: Trachea normal, normal range of motion and phonation  normal. Neck supple. Normal carotid pulses and no JVD present. Muscular tenderness present. No tracheal tenderness and no spinous process tenderness present. Carotid bruit is not present. No tracheal deviation present. No thyroid mass and no thyromegaly present.  Cardiovascular: Normal rate, regular rhythm, normal heart sounds and intact distal pulses. Exam reveals no gallop and no friction rub.  No murmur heard. Pulmonary/Chest: Effort normal and breath sounds normal. No respiratory distress. He has no wheezes.  Abdominal: Soft. Bowel sounds are normal. There is no tenderness.  Musculoskeletal:       Cervical back: He exhibits tenderness, pain and spasm. He exhibits normal range of motion, no bony tenderness, no swelling, no edema, no deformity, no laceration and normal pulse.       Back:  Neck FROM: pain with bilateral rotation and right and left side flexion. No pain with flexion or extension.  Neurological: He is alert and oriented to person, place, and time. He has normal strength and normal reflexes. No cranial nerve deficit or sensory deficit. He displays a negative Romberg sign.  Skin: Skin is warm and dry. Capillary refill takes less than 2 seconds. He is not diaphoretic.  Psychiatric: He has a normal  mood and affect. His behavior is normal. Judgment and thought content normal.  Nursing note and vitals reviewed.       Assessment & Plan:   1. Cervical muscle strain, subsequent encounter Moist heat 3-4 times per day for 20 minutes. Cervical strengthening exercises. Stop other NSAIDS and Robaxin, use diclofenac gel and muscle relaxer as needed, sedation precautions provided. Use a flat pillow. - tiZANidine (ZANAFLEX) 2 MG tablet; Take 1 tablet (2 mg total) by mouth every 6 (six) hours as needed for muscle spasms.  Dispense: 30 tablet; Refill: 0 - diclofenac sodium (VOLTAREN) 1 % GEL; Apply 2 g topically 4 (four) times daily.  Dispense: 1 Tube; Refill: 1   Continue all other maintenance medications.  Follow up plan: Return if symptoms worsen or fail to improve.  Educational handout given for cervical strain and sprain rehab  The above assessment and management plan was discussed with the patient. The patient verbalized understanding of and has agreed to the management plan. Patient is aware to call the clinic if symptoms persist or worsen. Patient is aware when to return to the clinic for a follow-up visit. Patient educated on when it is appropriate to go to the emergency department.   Monia Pouch, FNP-C [facility] [phone]

## 2017-10-10 ENCOUNTER — Ambulatory Visit (INDEPENDENT_AMBULATORY_CARE_PROVIDER_SITE_OTHER): Payer: Medicare Other | Admitting: Physician Assistant

## 2017-10-10 ENCOUNTER — Encounter: Payer: Self-pay | Admitting: Physician Assistant

## 2017-10-10 ENCOUNTER — Other Ambulatory Visit: Payer: Self-pay

## 2017-10-10 ENCOUNTER — Encounter (HOSPITAL_COMMUNITY): Payer: Self-pay | Admitting: *Deleted

## 2017-10-10 ENCOUNTER — Emergency Department (HOSPITAL_COMMUNITY)
Admission: EM | Admit: 2017-10-10 | Discharge: 2017-10-10 | Disposition: A | Discharge status: Home / Self Care | Payer: Medicare Other | Attending: Emergency Medicine | Admitting: Emergency Medicine

## 2017-10-10 VITALS — BP 144/85 | HR 83 | Temp 98.7°F | Ht 73.0 in | Wt 199.0 lb

## 2017-10-10 DIAGNOSIS — Z7982 Long term (current) use of aspirin: Secondary | ICD-10-CM | POA: Insufficient documentation

## 2017-10-10 DIAGNOSIS — D649 Anemia, unspecified: Secondary | ICD-10-CM

## 2017-10-10 DIAGNOSIS — R42 Dizziness and giddiness: Secondary | ICD-10-CM | POA: Insufficient documentation

## 2017-10-10 DIAGNOSIS — J449 Chronic obstructive pulmonary disease, unspecified: Secondary | ICD-10-CM | POA: Insufficient documentation

## 2017-10-10 DIAGNOSIS — D509 Iron deficiency anemia, unspecified: Secondary | ICD-10-CM | POA: Diagnosis not present

## 2017-10-10 DIAGNOSIS — R195 Other fecal abnormalities: Secondary | ICD-10-CM | POA: Insufficient documentation

## 2017-10-10 DIAGNOSIS — Z85048 Personal history of other malignant neoplasm of rectum, rectosigmoid junction, and anus: Secondary | ICD-10-CM | POA: Diagnosis not present

## 2017-10-10 DIAGNOSIS — K921 Melena: Secondary | ICD-10-CM

## 2017-10-10 DIAGNOSIS — Z8673 Personal history of transient ischemic attack (TIA), and cerebral infarction without residual deficits: Secondary | ICD-10-CM | POA: Diagnosis not present

## 2017-10-10 DIAGNOSIS — Z87891 Personal history of nicotine dependence: Secondary | ICD-10-CM | POA: Insufficient documentation

## 2017-10-10 DIAGNOSIS — Z79899 Other long term (current) drug therapy: Secondary | ICD-10-CM | POA: Insufficient documentation

## 2017-10-10 DIAGNOSIS — R5383 Other fatigue: Secondary | ICD-10-CM | POA: Diagnosis not present

## 2017-10-10 LAB — COMPREHENSIVE METABOLIC PANEL
ALT: 12 U/L (ref 0–44)
AST: 12 U/L — AB (ref 15–41)
Albumin: 3.3 g/dL — ABNORMAL LOW (ref 3.5–5.0)
Alkaline Phosphatase: 70 U/L (ref 38–126)
Anion gap: 6 (ref 5–15)
BUN: 37 mg/dL — AB (ref 8–23)
CO2: 23 mmol/L (ref 22–32)
Calcium: 8.8 mg/dL — ABNORMAL LOW (ref 8.9–10.3)
Chloride: 109 mmol/L (ref 98–111)
Creatinine, Ser: 0.93 mg/dL (ref 0.61–1.24)
GFR calc non Af Amer: >60 mL/min (ref >60)
Glucose, Bld: 95 mg/dL (ref 70–99)
POTASSIUM: 3.5 mmol/L (ref 3.5–5.1)
SODIUM: 138 mmol/L (ref 135–145)
Total Bilirubin: 0.5 mg/dL (ref 0.3–1.2)
Total Protein: 6.6 g/dL (ref 6.5–8.1)

## 2017-10-10 LAB — CBC
HEMATOCRIT: 32.1 % — AB (ref 39.0–52.0)
Hemoglobin: 10.2 g/dL — ABNORMAL LOW (ref 13.0–17.0)
MCH: 29.1 pg (ref 26.0–34.0)
MCHC: 31.8 g/dL (ref 30.0–36.0)
MCV: 91.7 fL (ref 78.0–100.0)
Platelets: 300 10*3/uL (ref 150–400)
RBC: 3.5 MIL/uL — AB (ref 4.22–5.81)
RDW: 12.9 % (ref 11.5–15.5)
WBC: 8.6 10*3/uL (ref 4.0–10.5)

## 2017-10-10 LAB — HEMOGLOBIN, FINGERSTICK: HEMOGLOBIN: 10.4 g/dL — AB (ref 12.6–17.7)

## 2017-10-10 LAB — TYPE AND SCREEN
ABO/RH(D): O NEG
Antibody Screen: NEGATIVE

## 2017-10-10 LAB — POC OCCULT BLOOD, ED: FECAL OCCULT BLD: POSITIVE — AB

## 2017-10-10 MED ORDER — PANTOPRAZOLE SODIUM 40 MG PO TBEC: 40.0000 mg | DELAYED_RELEASE_TABLET | Freq: Two times a day (BID) | ORAL | 0 refills | Status: DC

## 2017-10-10 MED ORDER — PANTOPRAZOLE SODIUM 40 MG PO TBEC
40.0000 mg | DELAYED_RELEASE_TABLET | Freq: Once | ORAL | Status: AC
  Administered 2017-10-10: 40 mg via ORAL
  Filled 2017-10-10: qty 1

## 2017-10-10 NOTE — Discharge Instructions (Signed)
Follow up with GI, return for more frequent bowel movements, light-headedness

## 2017-10-10 NOTE — Progress Notes (Addendum)
BP (!) 144/85   Pulse 83   Temp 98.7 F (37.1 C) (Oral)   Ht 6\' 1"  (1.854 m)   Wt 199 lb (90.3 kg)   BMI 26.25 kg/m     Subjective:    Patient ID: Todd Berg, male    DOB: [birth year], 67 y.o.   MRN: [id number]  HPI: Todd Berg is a 67 y.o. male presenting on 10/10/2017 for Melena  This patient comes in for melena for the past several days.  He has had a history of this in the past.  Last time he actually was admitted for melena and a GI bleed.  [facility] went to [facility].  He states he is feeling extremely weak and fatigued.  In checking today here hemoglobin was 10.4.  3 months ago it was 13.3. The lowest it was last year was 9.  He states that he has had excessive fatigue even with water.  Even pain up into his back and shoulders.  He denies any diarrhea.  Past Medical History:  Diagnosis Date  . Cancer ([place]) 2015   rectal  . Chronic back pain   . COPD (chronic obstructive pulmonary disease) ([place])   . GERD (gastroesophageal reflux disease)   . Hematemesis   . Hemorrhoids   . Hypotension   . Melena   . Stroke [facility])    Relevant past medical, surgical, family and social history reviewed and updated as indicated. Interim medical history since our last visit reviewed. Allergies and medications reviewed and updated. DATA REVIEWED: CHART IN EPIC  Family History reviewed for pertinent findings.  Review of Systems  Constitutional: Positive for activity change and fatigue. Negative for appetite change and diaphoresis.  Eyes: Negative for pain and visual disturbance.  Respiratory: Positive for shortness of breath. Negative for cough, chest tightness and wheezing.   Cardiovascular: Negative.  Negative for chest pain, palpitations and leg swelling.  Gastrointestinal: Positive for abdominal pain and blood in stool. Negative for abdominal distention, anal bleeding, diarrhea, nausea and vomiting.  Genitourinary: Negative.   Skin: Negative.  Negative for color change  and rash.  Neurological: Positive for weakness. Negative for numbness and headaches.  Psychiatric/Behavioral: Negative.     Allergies as of 10/10/2017   No Known Allergies     Medication List        Accurate as of 10/10/17 10:20 AM. Always use your most recent med list.          amLODipine 2.5 MG tablet Commonly known as:  NORVASC TAKE 1 TABLET DAILY   aspirin EC 325 MG tablet Take 1 tablet (325 mg total) by mouth daily.   atorvastatin 80 MG tablet Commonly known as:  LIPITOR Take 1 tablet (80 mg total) by mouth daily at 6 PM.   diclofenac sodium 1 % Gel Commonly known as:  VOLTAREN Apply 2 g topically 4 (four) times daily.   fluticasone 50 MCG/ACT nasal spray Commonly known as:  FLONASE Place 1 spray into both nostrils 2 (two) times daily as needed for allergies or rhinitis.   tetrahydrozoline 0.05 % ophthalmic solution Place 1 drop daily as needed into both eyes (itching).   tiZANidine 2 MG tablet Commonly known as:  ZANAFLEX Take 1 tablet (2 mg total) by mouth every 6 (six) hours as needed for muscle spasms.   triamcinolone cream 0.1 % Commonly known as:  KENALOG Apply 1 application topically 2 (two) times daily.  Objective:    BP (!) 144/85   Pulse 83   Temp 98.7 F (37.1 C) (Oral)   Ht 6\' 1"  (1.854 m)   Wt 199 lb (90.3 kg)   BMI 26.25 kg/m    No Known Allergies  Wt Readings from Last 3 Encounters:  10/10/17 199 lb (90.3 kg)  10/06/17 199 lb (90.3 kg)  09/22/17 198 lb (89.8 kg)    Physical Exam  Constitutional: He appears well-developed and well-nourished. He appears distressed.  HENT:  Head: Normocephalic and atraumatic.  Eyes: Pupils are equal, round, and reactive to light. Conjunctivae and EOM are normal.  Cardiovascular: Normal rate, regular rhythm and normal heart sounds. Exam reveals no friction rub.  No murmur heard. Pulmonary/Chest: Effort normal and breath sounds normal. No respiratory distress. He has no wheezes. He  exhibits no tenderness.  Abdominal: He exhibits distension. There is tenderness.  Skin: Skin is warm and dry. He is not diaphoretic.  Psychiatric: He has a normal mood and affect. His behavior is normal.  Nursing note and vitals reviewed.    CURRENT HGB in office: 10.4, 4 months ago was over 13    Assessment & Plan:   1. Black stool - Hemoglobin, fingerstick  2. Iron deficiency anemia, unspecified iron deficiency anemia type Going to [facility] ED  3. Anemia, unspecified type Going to [facility] ED  4. Other fatigue Going to [facility] ED   Continue all other maintenance medications as listed above.  Follow up plan: No follow-ups on file.  Educational handout given for Louann PA-C [facility] [address]  [place], [place] [address] [phone]   10/10/2017, 10:20 AM

## 2017-10-10 NOTE — ED Provider Notes (Signed)
[facility] EMERGENCY DEPARTMENT Provider Note   CSN: [id number] Arrival date & time: 10/10/17  1133     History   Chief Complaint Chief Complaint  Patient presents with  . GI Bleeding    HPI Todd Berg is a 67 y.o. male.  67 yo M with 5 weeks of dark stools.  He felt that it got more dark this morning.  He made appointment and saw his family doctor who told him that he may be bleeding to death and need to come to the ED right away.  Patient has felt mildly lightheaded but otherwise denies any other symptoms.  Denies abdominal pain denies fevers.  He denies prior colonoscopy. Has had an EGD in the past.   The history is provided by the patient.  Illness  This is a new problem. The current episode started more than 1 week ago. The problem occurs constantly. The problem has not changed since onset.Pertinent negatives include no chest pain, no abdominal pain, no headaches and no shortness of breath. Nothing aggravates the symptoms. Nothing relieves the symptoms. He has tried nothing for the symptoms. The treatment provided no relief.    Past Medical History:  Diagnosis Date  . Cancer ([place]) 2015   rectal  . Chronic back pain   . COPD (chronic obstructive pulmonary disease) ([place])   . GERD (gastroesophageal reflux disease)   . Hematemesis   . Hemorrhoids   . Hypotension   . Melena   . Stroke [facility])     Patient Active Problem List   Diagnosis Date Noted  . Anemia 10/10/2017  . Fatigue 10/10/2017  . Hypertension 03/21/2017  . Generalized anxiety disorder 03/21/2017  . Degenerative disc disease, lumbar 03/17/2017  . Iron deficiency anemia due to chronic blood loss 11/20/2016  . GI bleed 06/09/2016  . Chronic back pain   . COPD (chronic obstructive pulmonary disease) ([place])   . GERD (gastroesophageal reflux disease)   . Hemorrhoids   . History of rectal cancer 09/13/2015    Past Surgical History:  Procedure Laterality Date  . BACK SURGERY    .  ESOPHAGOGASTRODUODENOSCOPY N/A 06/09/2016   Procedure: ESOPHAGOGASTRODUODENOSCOPY (EGD);  Surgeon: Wilford Corner, MD;  Location: [place];  Service: Endoscopy;  Laterality: N/A;  . ESOPHAGOGASTRODUODENOSCOPY (EGD) WITH PROPOFOL Left 11/21/2016   Procedure: ESOPHAGOGASTRODUODENOSCOPY (EGD) WITH PROPOFOL;  Surgeon: Arta Silence, MD;  Location: [facility] ENDOSCOPY;  Service: Endoscopy;  Laterality: Left;  . HEMORRHOID SURGERY    . HERNIA REPAIR    . RECTAL SURGERY          Home Medications    Prior to Admission medications   Medication Sig Start Date End Date Taking? Authorizing Provider  amLODipine (NORVASC) 2.5 MG tablet TAKE 1 TABLET DAILY 08/24/17   Dettinger, [name], MD  aspirin EC 325 MG tablet Take 1 tablet (325 mg total) by mouth daily. 06/22/17   Venancio Poisson, NP  atorvastatin (LIPITOR) 80 MG tablet Take 1 tablet (80 mg total) by mouth daily at 6 PM. 06/09/17   Dettinger, [name], MD  diclofenac sodium (VOLTAREN) 1 % GEL Apply 2 g topically 4 (four) times daily. 10/06/17 11/05/17  Baruch Gouty, FNP  fluticasone (FLONASE) 50 MCG/ACT nasal spray Place 1 spray into both nostrils 2 (two) times daily as needed for allergies or rhinitis. 06/20/17   Dettinger, [name], MD  pantoprazole (PROTONIX) 40 MG tablet Take 1 tablet (40 mg total) by mouth 2 (two) times daily. 10/10/17   Tyrone Nine,  Hayward Rylander, DO  tetrahydrozoline 0.05 % ophthalmic solution Place 1 drop daily as needed into both eyes (itching).    [provider]  tiZANidine (ZANAFLEX) 2 MG tablet Take 1 tablet (2 mg total) by mouth every 6 (six) hours as needed for muscle spasms. 10/06/17   Baruch Gouty, FNP  triamcinolone cream (KENALOG) 0.1 % Apply 1 application topically 2 (two) times daily. 04/08/17   Dettinger, [name], MD    Family History Family History  Problem Relation Age of Onset  . COPD Mother   . Stroke Father     Social History Social History   Tobacco Use  . Smoking status: Former Smoker    Start  date: 12/30/1964    Last attempt to quit: 06/19/2009    Years since quitting: 8.3  . Smokeless tobacco: Former Systems developer    Types: Snuff    Quit date: 09/26/2005  Substance Use Topics  . Alcohol use: No  . Drug use: No     Allergies   Patient has no known allergies.   Review of Systems Review of Systems  Constitutional: Negative for chills and fever.  HENT: Negative for congestion and facial swelling.   Eyes: Negative for discharge and visual disturbance.  Respiratory: Negative for shortness of breath.   Cardiovascular: Negative for chest pain and palpitations.  Gastrointestinal: Positive for blood in stool (dark stool). Negative for abdominal pain, diarrhea and vomiting.  Musculoskeletal: Negative for arthralgias and myalgias.  Skin: Negative for color change and rash.  Neurological: Negative for tremors, syncope and headaches.  Psychiatric/Behavioral: Negative for confusion and dysphoric mood.     Physical Exam Updated Vital Signs BP 128/74 (BP Location: Right Arm)   Pulse 82   Temp 98 F (36.7 C) (Oral)   Resp 20   SpO2 100%   Physical Exam  Constitutional: He is oriented to person, place, and time. He appears well-developed and well-nourished.  HENT:  Head: Normocephalic and atraumatic.  Eyes: Pupils are equal, round, and reactive to light. EOM are normal.  Neck: Normal range of motion. Neck supple. No JVD present.  Cardiovascular: Normal rate and regular rhythm. Exam reveals no gallop and no friction rub.  No murmur heard. Pulmonary/Chest: No respiratory distress. He has no wheezes.  Abdominal: He exhibits no distension and no mass. There is no tenderness. There is no rebound and no guarding.  Genitourinary:  Genitourinary Comments: No hemorrhoids, dark stool  Musculoskeletal: Normal range of motion.  Neurological: He is alert and oriented to person, place, and time.  Skin: No rash noted. No pallor.  Psychiatric: He has a normal mood and affect. His behavior is  normal.  Nursing note and vitals reviewed.    ED Treatments / Results  Labs (all labs ordered are listed, but only abnormal results are displayed) Labs Reviewed  COMPREHENSIVE METABOLIC PANEL - Abnormal; Notable for the following components:      Result Value   BUN 37 (*)    Calcium 8.8 (*)    Albumin 3.3 (*)    AST 12 (*)    All other components within normal limits  CBC - Abnormal; Notable for the following components:   RBC 3.50 (*)    Hemoglobin 10.2 (*)    HCT 32.1 (*)    All other components within normal limits  POC OCCULT BLOOD, ED - Abnormal; Notable for the following components:   Fecal Occult Bld POSITIVE (*)    All other components within normal limits  TYPE AND SCREEN  EKG None  Radiology No results found.  Procedures Procedures (including critical care time)  Medications Ordered in ED Medications  pantoprazole (PROTONIX) EC tablet 40 mg (has no administration in time range)     Initial Impression / Assessment and Plan / ED Course  I have reviewed the triage vital signs and the nursing notes.  Pertinent labs & imaging results that were available during my care of the patient were reviewed by me and considered in my medical decision making (see chart for details).     67 yo M with a cc of dark stool.  This been going on for 5 weeks but he thought was more dark today.  He denies any other symptoms.  When asked directly he has had some mild lightheadedness.  The patient's stool here is dark brown.  There is no gross blood.  Is Hemoccult positive.  His vital signs are stable his hemoglobin is 10.2 which is similar to where he is been in the past.  I discussed the case with Dr. Oletta Lamas, gastroenterology he recommended that I felt he was safe for discharge to have him follow-up in the next couple days in the office.  1:57 PM:  I have discussed the diagnosis/risks/treatment options with the patient and family and believe the pt to be eligible for discharge  home to follow-up with PCP. We also discussed returning to the ED immediately if new or worsening sx occur. We discussed the sx which are most concerning (e.g., sudden worsening pain, fever, inability to tolerate by mouth) that necessitate immediate return. Medications administered to the patient during their visit and any new prescriptions provided to the patient are listed below.  Medications given during this visit Medications  pantoprazole (PROTONIX) EC tablet 40 mg (has no administration in time range)      The patient appears reasonably screen and/or stabilized for discharge and I doubt any other medical condition or other [facility] requiring further screening, evaluation, or treatment in the ED at this time prior to discharge.    Final Clinical Impressions(s) / ED Diagnoses   Final diagnoses:  Dark stools    ED Discharge Orders         Ordered    pantoprazole (PROTONIX) 40 MG tablet  2 times daily     10/10/17 1339           Deno Etienne, DO 10/10/17 1357

## 2017-10-10 NOTE — ED Triage Notes (Signed)
Pt in reporting dark and tarry stools for a "long time", states it is getting worse, in for further evaluation, denies abdominal pain, no distress noted

## 2017-10-11 NOTE — ED Notes (Signed)
Spoke with pt. After he left message and reviewed discharge instructions with pt. And his wife.  The both needed clarification on follow-up GI.  All questions answered and pt. And wife verbalized understanding.

## 2017-10-12 ENCOUNTER — Observation Stay (HOSPITAL_COMMUNITY)
Admission: EM | Admit: 2017-10-12 | Discharge: 2017-10-14 | Disposition: A | Discharge status: Home / Self Care | Payer: Medicare Other | Attending: Internal Medicine | Admitting: Internal Medicine

## 2017-10-12 ENCOUNTER — Encounter (HOSPITAL_COMMUNITY): Payer: Self-pay | Admitting: Emergency Medicine

## 2017-10-12 ENCOUNTER — Other Ambulatory Visit: Payer: Self-pay

## 2017-10-12 DIAGNOSIS — K921 Melena: Secondary | ICD-10-CM | POA: Diagnosis not present

## 2017-10-12 DIAGNOSIS — D649 Anemia, unspecified: Secondary | ICD-10-CM | POA: Diagnosis present

## 2017-10-12 DIAGNOSIS — J449 Chronic obstructive pulmonary disease, unspecified: Secondary | ICD-10-CM | POA: Diagnosis present

## 2017-10-12 DIAGNOSIS — M549 Dorsalgia, unspecified: Secondary | ICD-10-CM | POA: Diagnosis not present

## 2017-10-12 DIAGNOSIS — K3189 Other diseases of stomach and duodenum: Secondary | ICD-10-CM | POA: Diagnosis not present

## 2017-10-12 DIAGNOSIS — F411 Generalized anxiety disorder: Secondary | ICD-10-CM | POA: Diagnosis not present

## 2017-10-12 DIAGNOSIS — I10 Essential (primary) hypertension: Secondary | ICD-10-CM | POA: Diagnosis not present

## 2017-10-12 DIAGNOSIS — Z8673 Personal history of transient ischemic attack (TIA), and cerebral infarction without residual deficits: Secondary | ICD-10-CM | POA: Diagnosis not present

## 2017-10-12 DIAGNOSIS — E876 Hypokalemia: Secondary | ICD-10-CM | POA: Diagnosis not present

## 2017-10-12 DIAGNOSIS — E785 Hyperlipidemia, unspecified: Secondary | ICD-10-CM | POA: Insufficient documentation

## 2017-10-12 DIAGNOSIS — Z87891 Personal history of nicotine dependence: Secondary | ICD-10-CM | POA: Insufficient documentation

## 2017-10-12 DIAGNOSIS — K922 Gastrointestinal hemorrhage, unspecified: Principal | ICD-10-CM | POA: Insufficient documentation

## 2017-10-12 DIAGNOSIS — Z8711 Personal history of peptic ulcer disease: Secondary | ICD-10-CM | POA: Insufficient documentation

## 2017-10-12 DIAGNOSIS — D62 Acute posthemorrhagic anemia: Secondary | ICD-10-CM | POA: Diagnosis not present

## 2017-10-12 DIAGNOSIS — K449 Diaphragmatic hernia without obstruction or gangrene: Secondary | ICD-10-CM | POA: Insufficient documentation

## 2017-10-12 DIAGNOSIS — Z79899 Other long term (current) drug therapy: Secondary | ICD-10-CM | POA: Diagnosis not present

## 2017-10-12 DIAGNOSIS — G8929 Other chronic pain: Secondary | ICD-10-CM | POA: Diagnosis not present

## 2017-10-12 DIAGNOSIS — R001 Bradycardia, unspecified: Secondary | ICD-10-CM | POA: Diagnosis not present

## 2017-10-12 DIAGNOSIS — I313 Pericardial effusion (noninflammatory): Secondary | ICD-10-CM | POA: Diagnosis not present

## 2017-10-12 DIAGNOSIS — K210 Gastro-esophageal reflux disease with esophagitis: Secondary | ICD-10-CM | POA: Diagnosis not present

## 2017-10-12 DIAGNOSIS — Z23 Encounter for immunization: Secondary | ICD-10-CM | POA: Insufficient documentation

## 2017-10-12 DIAGNOSIS — S161XXD Strain of muscle, fascia and tendon at neck level, subsequent encounter: Secondary | ICD-10-CM | POA: Diagnosis not present

## 2017-10-12 DIAGNOSIS — Z7982 Long term (current) use of aspirin: Secondary | ICD-10-CM | POA: Insufficient documentation

## 2017-10-12 DIAGNOSIS — K219 Gastro-esophageal reflux disease without esophagitis: Secondary | ICD-10-CM | POA: Diagnosis present

## 2017-10-12 DIAGNOSIS — X58XXXD Exposure to other specified factors, subsequent encounter: Secondary | ICD-10-CM | POA: Insufficient documentation

## 2017-10-12 DIAGNOSIS — Z85048 Personal history of other malignant neoplasm of rectum, rectosigmoid junction, and anus: Secondary | ICD-10-CM | POA: Insufficient documentation

## 2017-10-12 HISTORY — DX: Melena: K92.1

## 2017-10-12 HISTORY — DX: Hyperlipidemia, unspecified: E78.5

## 2017-10-12 LAB — COMPREHENSIVE METABOLIC PANEL
ALK PHOS: 67 U/L (ref 38–126)
ALT: 15 U/L (ref 0–44)
AST: 18 U/L (ref 15–41)
Albumin: 3.2 g/dL — ABNORMAL LOW (ref 3.5–5.0)
Anion gap: 5 (ref 5–15)
BILIRUBIN TOTAL: 0.4 mg/dL (ref 0.3–1.2)
BUN: 20 mg/dL (ref 8–23)
CALCIUM: 8.8 mg/dL — AB (ref 8.9–10.3)
CO2: 25 mmol/L (ref 22–32)
Chloride: 109 mmol/L (ref 98–111)
Creatinine, Ser: 1.11 mg/dL (ref 0.61–1.24)
GFR calc Af Amer: >60 mL/min (ref >60)
GFR calc non Af Amer: >60 mL/min (ref >60)
GLUCOSE: 104 mg/dL — AB (ref 70–99)
Potassium: 3.2 mmol/L — ABNORMAL LOW (ref 3.5–5.1)
SODIUM: 139 mmol/L (ref 135–145)
TOTAL PROTEIN: 6.3 g/dL — AB (ref 6.5–8.1)

## 2017-10-12 LAB — CBC
HCT: 27.9 % — ABNORMAL LOW (ref 39.0–52.0)
Hemoglobin: 8.9 g/dL — ABNORMAL LOW (ref 13.0–17.0)
MCH: 29.3 pg (ref 26.0–34.0)
MCHC: 31.9 g/dL (ref 30.0–36.0)
MCV: 91.8 fL (ref 78.0–100.0)
Platelets: 286 10*3/uL (ref 150–400)
RBC: 3.04 MIL/uL — ABNORMAL LOW (ref 4.22–5.81)
RDW: 13 % (ref 11.5–15.5)
WBC: 7.4 10*3/uL (ref 4.0–10.5)

## 2017-10-12 LAB — TYPE AND SCREEN
ABO/RH(D): O NEG
Antibody Screen: NEGATIVE

## 2017-10-12 LAB — HEMOGLOBIN AND HEMATOCRIT, BLOOD
HEMATOCRIT: 26.7 % — AB (ref 39.0–52.0)
Hemoglobin: 8.5 g/dL — ABNORMAL LOW (ref 13.0–17.0)

## 2017-10-12 LAB — URINALYSIS, ROUTINE W REFLEX MICROSCOPIC
BILIRUBIN URINE: NEGATIVE
Glucose, UA: NEGATIVE mg/dL
HGB URINE DIPSTICK: NEGATIVE
KETONES UR: NEGATIVE mg/dL
Leukocytes, UA: NEGATIVE
Nitrite: NEGATIVE
Protein, ur: NEGATIVE mg/dL
SPECIFIC GRAVITY, URINE: 1.004 — AB (ref 1.005–1.030)
pH: 6 (ref 5.0–8.0)

## 2017-10-12 LAB — MAGNESIUM: Magnesium: 2 mg/dL (ref 1.7–2.4)

## 2017-10-12 LAB — PHOSPHORUS: Phosphorus: 3.3 mg/dL (ref 2.5–4.6)

## 2017-10-12 MED ORDER — INFLUENZA VAC SPLIT HIGH-DOSE 0.5 ML IM SUSY
0.5000 mL | PREFILLED_SYRINGE | INTRAMUSCULAR | Status: AC
  Administered 2017-10-13: 0.5 mL via INTRAMUSCULAR
  Filled 2017-10-12: qty 0.5

## 2017-10-12 MED ORDER — ONDANSETRON HCL 4 MG/2ML IJ SOLN: 4.0000 mg | Freq: Four times a day (QID) | INTRAMUSCULAR | Status: DC | PRN

## 2017-10-12 MED ORDER — PANTOPRAZOLE SODIUM 40 MG PO TBEC
40.0000 mg | DELAYED_RELEASE_TABLET | Freq: Two times a day (BID) | ORAL | Status: DC
  Administered 2017-10-12 – 2017-10-13 (×3): 40 mg via ORAL
  Filled 2017-10-12 (×4): qty 1

## 2017-10-12 MED ORDER — ACETAMINOPHEN 650 MG RE SUPP: 650.0000 mg | Freq: Four times a day (QID) | RECTAL | Status: DC | PRN

## 2017-10-12 MED ORDER — POTASSIUM CHLORIDE IN NACL 20-0.9 MEQ/L-% IV SOLN
INTRAVENOUS | Status: DC
  Administered 2017-10-12 – 2017-10-14 (×5): via INTRAVENOUS
  Filled 2017-10-12 (×3): qty 1000

## 2017-10-12 MED ORDER — POTASSIUM CHLORIDE 10 MEQ/100ML IV SOLN
10.0000 meq | INTRAVENOUS | Status: AC
  Administered 2017-10-12: 10 meq via INTRAVENOUS
  Filled 2017-10-12: qty 100

## 2017-10-12 MED ORDER — ONDANSETRON HCL 4 MG PO TABS: 4.0000 mg | ORAL_TABLET | Freq: Four times a day (QID) | ORAL | Status: DC | PRN

## 2017-10-12 MED ORDER — FAMOTIDINE IN NACL 20-0.9 MG/50ML-% IV SOLN
20.0000 mg | Freq: Two times a day (BID) | INTRAVENOUS | Status: DC
  Administered 2017-10-12 – 2017-10-14 (×5): 20 mg via INTRAVENOUS
  Filled 2017-10-12 (×5): qty 50

## 2017-10-12 MED ORDER — PNEUMOCOCCAL VAC POLYVALENT 25 MCG/0.5ML IJ INJ
0.5000 mL | INJECTION | INTRAMUSCULAR | Status: AC
  Administered 2017-10-13: 0.5 mL via INTRAMUSCULAR
  Filled 2017-10-12: qty 0.5

## 2017-10-12 MED ORDER — ACETAMINOPHEN 325 MG PO TABS
650.0000 mg | ORAL_TABLET | Freq: Four times a day (QID) | ORAL | Status: DC | PRN
  Administered 2017-10-13 – 2017-10-14 (×3): 650 mg via ORAL
  Filled 2017-10-12 (×3): qty 2

## 2017-10-12 NOTE — ED Triage Notes (Signed)
Patient to ED c/o black stools - was seen and treated for same a few days ago and told to return if it became worse. He denies abd pain, N/V, dizziness.

## 2017-10-12 NOTE — ED Provider Notes (Signed)
[place] EMERGENCY DEPARTMENT Provider Note   CSN: [id number] Arrival date & time: 10/12/17  1057     History   Chief Complaint Chief Complaint  Patient presents with  . GI Bleeding    HPI Todd Berg is a 67 y.o. male.  67 year old male with past medical history including peptic ulcer disease, rectal cancer, COPD, CVA, HLD, GERD who p/w melena.  The patient presented here on 9/30 with dark stools that tested Hemoccult positive.  His hemoglobin was stable at the time and after discussion with GI he was determined to be safe for outpatient follow-up.  He reports that he has continued to have black stools at home including an episode last night and this morning.  He denies any associated abdominal pain.  No nausea, vomiting, urinary symptoms, fevers, or chest pain.  He endorses occasional lightheadedness.  No anticoagulant use.  He states that his wife manages his medications.  The history is provided by the patient.    Past Medical History:  Diagnosis Date  . Cancer ([place]) 2015   rectal  . Chronic back pain   . COPD (chronic obstructive pulmonary disease) ([place])   . GERD (gastroesophageal reflux disease)   . Hematemesis   . Hemorrhoids   . Hyperlipidemia   . Hypotension   . Melena   . Stroke [facility])     Patient Active Problem List   Diagnosis Date Noted  . Melena 10/12/2017  . Hypokalemia 10/12/2017  . Anemia 10/10/2017  . Fatigue 10/10/2017  . Hypertension 03/21/2017  . Generalized anxiety disorder 03/21/2017  . Degenerative disc disease, lumbar 03/17/2017  . Iron deficiency anemia due to chronic blood loss 11/20/2016  . GI bleed 06/09/2016  . Chronic back pain   . COPD (chronic obstructive pulmonary disease) ([place])   . GERD (gastroesophageal reflux disease)   . Hemorrhoids   . History of rectal cancer 09/13/2015    Past Surgical History:  Procedure Laterality Date  . BACK SURGERY    . ESOPHAGOGASTRODUODENOSCOPY N/A 06/09/2016   Procedure: ESOPHAGOGASTRODUODENOSCOPY (EGD);  Surgeon: Wilford Corner, MD;  Location: [place];  Service: Endoscopy;  Laterality: N/A;  . ESOPHAGOGASTRODUODENOSCOPY (EGD) WITH PROPOFOL Left 11/21/2016   Procedure: ESOPHAGOGASTRODUODENOSCOPY (EGD) WITH PROPOFOL;  Surgeon: Arta Silence, MD;  Location[facility] ENDOSCOPY;  Service: Endoscopy;  Laterality: Left;  . HEMORRHOID SURGERY    . HERNIA REPAIR    . RECTAL SURGERY          Home Medications    Prior to Admission medications   Medication Sig Start Date End Date Taking? Authorizing Provider  amLODipine (NORVASC) 2.5 MG tablet TAKE 1 TABLET DAILY 08/24/17   Dettinger, [name], MD  aspirin EC 325 MG tablet Take 1 tablet (325 mg total) by mouth daily. 06/22/17   Venancio Poisson, NP  atorvastatin (LIPITOR) 80 MG tablet Take 1 tablet (80 mg total) by mouth daily at 6 PM. 06/09/17   Dettinger, [name], MD  diclofenac sodium (VOLTAREN) 1 % GEL Apply 2 g topically 4 (four) times daily. 10/06/17 11/05/17  Baruch Gouty, FNP  fluticasone (FLONASE) 50 MCG/ACT nasal spray Place 1 spray into both nostrils 2 (two) times daily as needed for allergies or rhinitis. 06/20/17   Dettinger, [name], MD  pantoprazole (PROTONIX) 40 MG tablet Take 1 tablet (40 mg total) by mouth 2 (two) times daily. 10/10/17   Deno Etienne, DO  tetrahydrozoline 0.05 % ophthalmic solution Place 1 drop daily as needed into both eyes (itching).  [provider]  tiZANidine (ZANAFLEX) 2 MG tablet Take 1 tablet (2 mg total) by mouth every 6 (six) hours as needed for muscle spasms. 10/06/17   Baruch Gouty, FNP  triamcinolone cream (KENALOG) 0.1 % Apply 1 application topically 2 (two) times daily. 04/08/17   Dettinger, [name], MD    Family History Family History  Problem Relation Age of Onset  . COPD Mother   . Stroke Father     Social History Social History   Tobacco Use  . Smoking status: Former Smoker    Start date: 12/30/1964    Last attempt to quit:  06/19/2009    Years since quitting: 8.3  . Smokeless tobacco: Former Systems developer    Types: Snuff    Quit date: 09/26/2005  Substance Use Topics  . Alcohol use: No  . Drug use: No     Allergies   Patient has no known allergies.   Review of Systems Review of Systems All other systems reviewed and are negative except that which was mentioned in HPI   Physical Exam Updated Vital Signs BP (!) 162/76   Pulse 63   Temp 98.5 F (36.9 C) (Oral)   Resp 15   SpO2 100%   Physical Exam  Constitutional: He is oriented to person, place, and time. He appears well-developed and well-nourished. No distress.  HENT:  Head: Normocephalic and atraumatic.  Moist mucous membranes  Eyes:  pale conjunctivae  Neck: Neck supple.  Cardiovascular: Regular rhythm and normal heart sounds. Bradycardia present.  No murmur heard. Pulmonary/Chest: Effort normal and breath sounds normal.  Abdominal: Soft. Bowel sounds are normal. He exhibits no distension. There is no tenderness.  Musculoskeletal: He exhibits no edema.  Neurological: He is alert and oriented to person, place, and time.  Fluent speech  Skin: Skin is warm and dry.  Psychiatric: He has a normal mood and affect. Judgment normal.  Nursing note and vitals reviewed.    ED Treatments / Results  Labs (all labs ordered are listed, but only abnormal results are displayed) Labs Reviewed  COMPREHENSIVE METABOLIC PANEL - Abnormal; Notable for the following components:      Result Value   Potassium 3.2 (*)    Glucose, Bld 104 (*)    Calcium 8.8 (*)    Total Protein 6.3 (*)    Albumin 3.2 (*)    All other components within normal limits  CBC - Abnormal; Notable for the following components:   RBC 3.04 (*)    Hemoglobin 8.9 (*)    HCT 27.9 (*)    All other components within normal limits  MAGNESIUM  HEMOGLOBIN AND HEMATOCRIT, BLOOD  HEMOGLOBIN AND HEMATOCRIT, BLOOD  PHOSPHORUS  POC OCCULT BLOOD, ED  TYPE AND SCREEN    EKG EKG  Interpretation  Date/Time:  Wednesday October 12 2017 14:31:58 EDT Ventricular Rate:  61 PR Interval:    QRS Duration: 96 QT Interval:  400 QTC Calculation: 403 R Axis:   38 Text Interpretation:  Sinus rhythm Borderline repolarization abnormality No significant change since last tracing Confirmed by Theotis Burrow [phone]) on 10/12/2017 2:41:48 PM   Radiology No results found.  Procedures Procedures (including critical care time)  Medications Ordered in ED Medications  famotidine (PEPCID) IVPB 20 mg premix (20 mg Intravenous New Bag/Given 10/12/17 1421)  0.9 % NaCl with KCl 20 mEq/ L  infusion (has no administration in time range)  potassium chloride 10 mEq in 100 mL IVPB (has no administration in time range)  Initial Impression / Assessment and Plan / ED Course  I have reviewed the triage vital signs and the nursing notes.  Pertinent labs that were available during my care of the patient were reviewed by me and considered in my medical decision making (see chart for details).    Patient here for persistent melena, history of peptic ulcers.  He was well-appearing on exam, reassuring vital signs.  No abdominal tenderness.  Labs today show normal creatinine, IMA globin has decreased from 10-8.9.  Given the persistence of his GI bleed, recommended admission for further work-up.  Discussed with gastroenterology and he will be seen by [facility], as he had previously been scheduled to f/u with Dr. Paulita Fujita in clinic. Discussed w/ [facility], Dr. Olevia Bowens, who will admit for further care.  Final Clinical Impressions(s) / ED Diagnoses   Final diagnoses:  None    ED Discharge Orders    None       Mckade Gurka, Wenda Overland, MD 10/12/17 1531

## 2017-10-12 NOTE — ED Notes (Signed)
Attempted report 

## 2017-10-12 NOTE — H&P (View-Only) (Signed)
[facility] Reason for consult: Melena Referring Physician: Emergency room.  PCP: Dr. Dettinger.  Primary GI: Dr. Outlaw.  Todd Berg is an 66 y.o. male.  HPI: He has a history of rectal cancer of which we have very little record.  It sounds from his history that he had a transanal resection of this number of years ago.  He apparently had a colonoscopy at [facility] 2017 by Dr. Benson for follow-up that the patient reports was normal.  He stated that he would like to have Dr. Outlaw to do this again in the future. He has been seen by our practice for recent GI bleeding last year.  He had gastric ulcers and esophagitis.  It was felt to be due to NSAIDs.  He was to follow-up with Dr. Outlaw have a repeat EGD but was unable to keep that appointment because he was readmitted with a mild stroke.  According to the records he has been taking Protonix daily.  He denies taking any NSAIDs.  He states that his wife gives him his medication and he is somewhat confused about what he is really taking.  No NSAIDs are listed in his medication list.He apparently has been having melena for several weeks intermittently but it got worse and the past few days causing him to go to [facility] and his hemoglobin was down to 10.  Had previously had been over 13.  Apparently there was some chest contusion causing chest pain and neck pain he had received ibuprofen and Robaxin for short periods.  His wife apparently said he had not taken his Protonix regularly.  He denied abdominal pain but had had dark stools and his stools were noted to be positive.  His hemoglobin in the emergency room and dropped to 8.9.  Past Medical History:  Diagnosis Date  . Cancer (HCC) 2015   rectal  . Chronic back pain   . COPD (chronic obstructive pulmonary disease) (HCC)   . GERD (gastroesophageal reflux disease)   . Hematemesis   . Hemorrhoids   . Hyperlipidemia   . Hypotension   . Melena    . Stroke (HCC)     Past Surgical History:  Procedure Laterality Date  . BACK SURGERY    . ESOPHAGOGASTRODUODENOSCOPY N/A 06/09/2016   Procedure: ESOPHAGOGASTRODUODENOSCOPY (EGD);  Surgeon: Schooler, Vincent, MD;  Location: [facility];  Service: Endoscopy;  Laterality: N/A;  . ESOPHAGOGASTRODUODENOSCOPY (EGD) WITH PROPOFOL Left 11/21/2016   Procedure: ESOPHAGOGASTRODUODENOSCOPY (EGD) WITH PROPOFOL;  Surgeon: Outlaw, William, MD;  Location: [facility] ENDOS[facility];  Service: Endoscopy;  Laterality: Left;  . HEMORRHOID SURGERY    . HERNIA REPAIR    . RECTAL SURGERY      Family History  Problem Relation Age of Onset  . COPD Mother   . Stroke Father     Social History:  reports that he quit smoking about 8 years ago. He started smoking about 52 years ago. He quit smokeless tobacco use about 12 years ago.  His smokeless tobacco use included snuff. He reports that he does not drink alcohol or use drugs.  Allergies: No Known Allergies  Medications; Prior to Admission medications   Medication Sig Start Date End Date Taking? Authorizing Provider  amLODipine (NORVASC) 2.5 MG tablet TAKE 1 TABLET DAILY 08/24/17   Dettinger, Joshua A, MD  aspirin EC 325 MG tablet Take 1 tablet (325 mg total) by mouth daily. 06/22/17   Vanschaick, Jessica, NP  atorvastatin (LIPITOR) 80 MG tablet Take 1 tablet (  80 mg total) by mouth daily at 6 PM. 06/09/17   Dettinger, Joshua A, MD  diclofenac sodium (VOLTAREN) 1 % GEL Apply 2 g topically 4 (four) times daily. 10/06/17 11/05/17  Rakes, Linda M, FNP  fluticasone (FLONASE) 50 MCG/ACT nasal spray Place 1 spray into both nostrils 2 (two) times daily as needed for allergies or rhinitis. 06/20/17   Dettinger, Joshua A, MD  pantoprazole (PROTONIX) 40 MG tablet Take 1 tablet (40 mg total) by mouth 2 (two) times daily. 10/10/17   Floyd, Dan, DO  tetrahydrozoline 0.05 % ophthalmic solution Place 1 drop daily as needed into both eyes (itching).    [provider]  tiZANidine  (ZANAFLEX) 2 MG tablet Take 1 tablet (2 mg total) by mouth every 6 (six) hours as needed for muscle spasms. 10/06/17   Rakes, Linda M, FNP  triamcinolone cream (KENALOG) 0.1 % Apply 1 application topically 2 (two) times daily. 04/08/17   Dettinger, Joshua A, MD   . pantoprazole  40 mg Oral BID   PRN Meds acetaminophen **OR** acetaminophen, ondansetron **OR** ondansetron (ZOFRAN) IV Results for orders placed or performed during the hospital encounter of 10/12/17 (from the past 48 hour(s))  Comprehensive metabolic panel     Status: Abnormal   Collection Time: 10/12/17 11:36 AM  Result Value Ref Range   Sodium 139 135 - 145 mmol/L   Potassium 3.2 (L) 3.5 - 5.1 mmol/L   Chloride 109 98 - 111 mmol/L   CO2 25 22 - 32 mmol/L   Glucose, Bld 104 (H) 70 - 99 mg/dL   BUN 20 8 - 23 mg/dL   Creatinine, Ser 1.11 0.61 - 1.24 mg/dL   Calcium 8.8 (L) 8.9 - 10.3 mg/dL   Total Protein 6.3 (L) 6.5 - 8.1 g/dL   Albumin 3.2 (L) 3.5 - 5.0 g/dL   AST 18 15 - 41 U/L   ALT 15 0 - 44 U/L   Alkaline Phosphatase 67 38 - 126 U/L   Total Bilirubin 0.4 0.3 - 1.2 mg/dL   GFR calc non Af Amer >60 >60 mL/min   GFR calc Af Amer >60 >60 mL/min    Comment: (NOTE) The eGFR has been calculated using the CKD EPI equation. This calculation has not been validated in all clinical situations. eGFR's persistently <60 mL/min signify possible Chronic Kidney Disease.    Anion gap 5 5 - 15    Comment: Performed at [facility], [address] [place], [place] [address]  CBC     Status: Abnormal   Collection Time: 10/12/17 11:36 AM  Result Value Ref Range   WBC 7.4 4.0 - 10.5 K/uL   RBC 3.04 (L) 4.22 - 5.81 MIL/uL   Hemoglobin 8.9 (L) 13.0 - 17.0 g/dL   HCT 27.9 (L) 39.0 - 52.0 %   MCV 91.8 78.0 - 100.0 fL   MCH 29.3 26.0 - 34.0 pg   MCHC 31.9 30.0 - 36.0 g/dL   RDW 13.0 11.5 - 15.5 %   Platelets 286 150 - 400 K/uL    Comment: Performed at [facility], [address] [place], [place] [address]  Type and  screen [facility]     Status: None   Collection Time: 10/12/17 11:40 AM  Result Value Ref Range   ABO/RH(D) O NEG    Antibody Screen NEG    Sample Expiration      10/15/2017 Performed at [facility], [address] [place], [place]   [address]     No results found.             Blood pressure (!) 148/68, pulse 63, temperature 97.7 F (36.5 C), temperature source Oral, resp. rate 16, height 6' (1.829 m), weight 89 kg, SpO2 100 %.  Physical exam:   General--Pleasant white male with multiple tattoos in no distress ENT--nonicteric neck--without masses Heart--regular rate and rhythm without murmurs or gallops Lungs--clear with no wheezing Abdomen--nondistended and soft and completely nontender Psych--answers questions appropriately but does seem to have some memory issues.   Assessment: 1.  Melena.  Patient's had a history and the past year of gastric ulcers has been apparently using a lot of NSAIDs and most likely has developed more ulcers.  He was due to be followed up for his most recent ulcers by Dr. Outlaw but had a CVA was not able to keep that appointment.  He does need to be scoped again. 2.  History of rectal cancer.  We do not have records of this but apparently this was removed transrectally in a number of years ago with subsequent colonoscopy since then negative.  He will need to continue to get colonoscopies on a regular basis 3.  Recent CVA  Plan: 1.  We will follow the patient in the hospital.  Agree with IV acid suppression.  I have discussed with the patient the fact that he needs to have his EGD repeated and will try to get him worked in tomorrow.   Preston Weill L Giannamarie Paulus 10/12/2017, 5:03 PM   This note was created using voice recognition software and minor errors may Have occurred unintentionally. Pager: [phone] If no answer or after hours call [phone]    

## 2017-10-12 NOTE — Consult Note (Signed)
[facility] Reason for consult: Melena Referring Physician: Emergency room.  PCP: Dr. Warrick Parisian.  Primary GI: Dr. Paulita Fujita.  Todd Berg is an 67 y.o. male.  HPI: He has a history of rectal cancer of which we have very little record.  It sounds from his history that he had a transanal resection of this number of years ago.  He apparently had a colonoscopy at [facility] 2017 by Dr. [name] for follow-up that the patient reports was normal.  He stated that he would like to have Dr. Paulita Fujita to do this again in the future. He has been seen by our practice for recent GI bleeding last year.  He had gastric ulcers and esophagitis.  It was felt to be due to NSAIDs.  He was to follow-up with Dr. Paulita Fujita have a repeat EGD but was unable to keep that appointment because he was readmitted with a mild stroke.  According to the records he has been taking Protonix daily.  He denies taking any NSAIDs.  He states that his wife gives him his medication and he is somewhat confused about what he is really taking.  No NSAIDs are listed in his medication list.He apparently has been having melena for several weeks intermittently but it got worse and the past few days causing him to go to [facility] and his hemoglobin was down to 10.  Had previously had been over 80.  Apparently there was some chest contusion causing chest pain and neck pain he had received ibuprofen and Robaxin for short periods.  His wife apparently said he had not taken his Protonix regularly.  He denied abdominal pain but had had dark stools and his stools were noted to be positive.  His hemoglobin in the emergency room and dropped to 8.9.  Past Medical History:  Diagnosis Date  . Cancer ([place]) 2015   rectal  . Chronic back pain   . COPD (chronic obstructive pulmonary disease) ([place])   . GERD (gastroesophageal reflux disease)   . Hematemesis   . Hemorrhoids   . Hyperlipidemia   . Hypotension   . Melena    . Stroke [facility])     Past Surgical History:  Procedure Laterality Date  . BACK SURGERY    . ESOPHAGOGASTRODUODENOSCOPY N/A 06/09/2016   Procedure: ESOPHAGOGASTRODUODENOSCOPY (EGD);  Surgeon: Wilford Corner, MD;  Location: [facility];  Service: Endoscopy;  Laterality: N/A;  . ESOPHAGOGASTRODUODENOSCOPY (EGD) WITH PROPOFOL Left 11/21/2016   Procedure: ESOPHAGOGASTRODUODENOSCOPY (EGD) WITH PROPOFOL;  Surgeon: Arta Silence, MD;  Location: [facility] ENDOSCOPY;  Service: Endoscopy;  Laterality: Left;  . HEMORRHOID SURGERY    . HERNIA REPAIR    . RECTAL SURGERY      Family History  Problem Relation Age of Onset  . COPD Mother   . Stroke Father     Social History:  reports that he quit smoking about 8 years ago. He started smoking about 52 years ago. He quit smokeless tobacco use about 12 years ago.  His smokeless tobacco use included snuff. He reports that he does not drink alcohol or use drugs.  Allergies: No Known Allergies  Medications; Prior to Admission medications   Medication Sig Start Date End Date Taking? Authorizing Provider  amLODipine (NORVASC) 2.5 MG tablet TAKE 1 TABLET DAILY 08/24/17   Dettinger, [name], MD  aspirin EC 325 MG tablet Take 1 tablet (325 mg total) by mouth daily. 06/22/17   Venancio Poisson, NP  atorvastatin (LIPITOR) 80 MG tablet Take 1 tablet (  80 mg total) by mouth daily at 6 PM. 06/09/17   Dettinger, [name], MD  diclofenac sodium (VOLTAREN) 1 % GEL Apply 2 g topically 4 (four) times daily. 10/06/17 11/05/17  Baruch Gouty, FNP  fluticasone (FLONASE) 50 MCG/ACT nasal spray Place 1 spray into both nostrils 2 (two) times daily as needed for allergies or rhinitis. 06/20/17   Dettinger, [name], MD  pantoprazole (PROTONIX) 40 MG tablet Take 1 tablet (40 mg total) by mouth 2 (two) times daily. 10/10/17   Deno Etienne, DO  tetrahydrozoline 0.05 % ophthalmic solution Place 1 drop daily as needed into both eyes (itching).    [provider]  tiZANidine  (ZANAFLEX) 2 MG tablet Take 1 tablet (2 mg total) by mouth every 6 (six) hours as needed for muscle spasms. 10/06/17   Baruch Gouty, FNP  triamcinolone cream (KENALOG) 0.1 % Apply 1 application topically 2 (two) times daily. 04/08/17   Dettinger, [name], MD   . pantoprazole  40 mg Oral BID   PRN Meds acetaminophen **OR** acetaminophen, ondansetron **OR** ondansetron (ZOFRAN) IV Results for orders placed or performed during the hospital encounter of 10/12/17 (from the past 48 hour(s))  Comprehensive metabolic panel     Status: Abnormal   Collection Time: 10/12/17 11:36 AM  Result Value Ref Range   Sodium 139 135 - 145 mmol/L   Potassium 3.2 (L) 3.5 - 5.1 mmol/L   Chloride 109 98 - 111 mmol/L   CO2 25 22 - 32 mmol/L   Glucose, Bld 104 (H) 70 - 99 mg/dL   BUN 20 8 - 23 mg/dL   Creatinine, Ser 1.11 0.61 - 1.24 mg/dL   Calcium 8.8 (L) 8.9 - 10.3 mg/dL   Total Protein 6.3 (L) 6.5 - 8.1 g/dL   Albumin 3.2 (L) 3.5 - 5.0 g/dL   AST 18 15 - 41 U/L   ALT 15 0 - 44 U/L   Alkaline Phosphatase 67 38 - 126 U/L   Total Bilirubin 0.4 0.3 - 1.2 mg/dL   GFR calc non Af Amer >60 >60 mL/min   GFR calc Af Amer >60 >60 mL/min    Comment: (NOTE) The eGFR has been calculated using the CKD EPI equation. This calculation has not been validated in all clinical situations. eGFR's persistently <60 mL/min signify possible Chronic Kidney Disease.    Anion gap 5 5 - 15    Comment: Performed at [facility] [address], [place], [place] [address]  CBC     Status: Abnormal   Collection Time: 10/12/17 11:36 AM  Result Value Ref Range   WBC 7.4 4.0 - 10.5 K/uL   RBC 3.04 (L) 4.22 - 5.81 MIL/uL   Hemoglobin 8.9 (L) 13.0 - 17.0 g/dL   HCT 27.9 (L) 39.0 - 52.0 %   MCV 91.8 78.0 - 100.0 fL   MCH 29.3 26.0 - 34.0 pg   MCHC 31.9 30.0 - 36.0 g/dL   RDW 13.0 11.5 - 15.5 %   Platelets 286 150 - 400 K/uL    Comment: Performed at [facility], [place] [address], [place], [place] [address]  Type and  screen [place]     Status: None   Collection Time: 10/12/17 11:40 AM  Result Value Ref Range   ABO/RH(D) O NEG    Antibody Screen NEG    Sample Expiration      10/15/2017 Performed at [facility], [place] [address], [place], [place]  [address]     No results found.             Blood pressure (!) 148/68, pulse 63, temperature 97.7 F (36.5 C), temperature source Oral, resp. rate 16, height 6' (1.829 m), weight 89 kg, SpO2 100 %.  Physical exam:   General--Pleasant white male with multiple tattoos in no distress ENT--nonicteric neck--without masses Heart--regular rate and rhythm without murmurs or gallops Lungs--clear with no wheezing Abdomen--nondistended and soft and completely nontender Psych--answers questions appropriately but does seem to have some memory issues.   Assessment: 1.  Melena.  Patient's had a history and the past year of gastric ulcers has been apparently using a lot of NSAIDs and most likely has developed more ulcers.  He was due to be followed up for his most recent ulcers by Dr. Paulita Fujita but had a CVA was not able to keep that appointment.  He does need to be scoped again. 2.  History of rectal cancer.  We do not have records of this but apparently this was removed transrectally in a number of years ago with subsequent colonoscopy since then negative.  He will need to continue to get colonoscopies on a regular basis 3.  Recent CVA  Plan: 1.  We will follow the patient in the hospital.  Agree with IV acid suppression.  I have discussed with the patient the fact that he needs to have his EGD repeated and will try to get him worked in [employer].   Nancy Fetter 10/12/2017, 5:03 PM   This note was created using voice recognition software and minor errors may Have occurred unintentionally. Pager: [phone] If no answer or after hours call [phone]

## 2017-10-12 NOTE — Progress Notes (Signed)
Todd Berg is a 67 y.o. male patient admitted from ED awake, alert - oriented  X 4 - no acute distress noted.  VSS - Blood pressure (!) 148/68, pulse 63, temperature 97.7 F (36.5 C), temperature source Oral, resp. rate 16, height 6' (1.829 m), weight 89 kg, SpO2 100 %.    IV in place, occlusive dsg intact without redness.  Orientation to room, and floor completed with information packet given to patient/family.  Patient declined safety video at this time.  Admission INP armband ID verified with patient/family, and in place.  Telemetry and skin check completed with second verifier.  SR up x 2, fall assessment complete, with patient and family able to verbalize understanding of risk associated with falls, and verbalized understanding to call nsg before up out of bed.  Call light within reach, patient able to voice, and demonstrate understanding.  Skin, clean-dry- intact without evidence of bruising, or skin tears.   No evidence of skin break down noted on exam.     Will cont to eval and treat per MD orders.  Howard Pouch, RN 10/12/2017 7:42 PM

## 2017-10-12 NOTE — H&P (Signed)
History and Physical    Todd Berg GYF:[id number] DOB: 09-02-1950 DOA: 10/12/2017  PCP: Dettinger, [name], MD   Patient coming from: Home.  I have personally briefly reviewed patient's old medical records in [place]  Chief Complaint: Melena  HPI: Todd Berg is a 67 y.o. male with medical history significant of chronic back pain, COPD, history of CVA, GERD, rectal cancer, history of PUD history of hematemesis, history of hemorrhoid and hemorrhoidectomy, history of rectal cancer and rectal surgery who is coming to the emergency department with complaints of progressively worse melena associated with decreased appetite, fatigue, somnolence and postural dizziness.  Per records, patient and patient's relatives he has been having melena for several weeks.  Although, they state that the melena has gotten a lot worse since last week.  He was seen at [facility] on Monday and his hemoglobin was 10.4, down from 13.3 g/dL on 05/25/2017.  They mention ibuprofen use since June 1 this year when he went to the emergency department with complaints of left-sided chest pain after a fall and was diagnosed with left chest contusion.  On 09/22/2017 he was seen in the ER for neck pain and was given ibuprofen 600 mg and Robaxin 500 mg.  The patient also takes a daily 325 mg EC aspirin.  He and his wife mentioned that he has not taken his Protonix like he should.  He has been taking this sporadically whenever he gets epigastric discomfort.  His wife stated that "even recently, he has not taken it twice a day like he is supposed to".  He denies abdominal pain at this time, hematemesis or hematochezia.  He also complains of occasional sinus headaches for while he takes a nasal spray prescribed by his PCP.  No fever, chills, sore throat, chest pain, dyspnea, palpitations, diaphoresis, PND or orthopnea.  He denies dysuria, frequency or hematuria.  No heat or cold intolerance.  No  polyuria, polydipsia, polyphagia or blurred vision.  Denies skin pruritus or jaundice.  History of GI procedures and tests: 08/2015 Colonoscopy, Dr [name] at [facility] for melena and hx rectal cancer.  Normal study.   05/2016 EGD, Dr Paulita Fujita for [place].  LA grade A esophagitis.  Non-bleeding gastric ulcers.  CG present in stomach but no active bleeding Serum H Pylori screening negative 05/2016.   11/2016 EGD Dr Michail Sermon for melena, epigastric pain: small HH.  Benign esoph stenosis.  Non-bleeding GU, suspected secondary to NSAIDs.    ED Course: Initial vital signs in the emergency department temperature 98.5 F, pulse 68, respirations 14, blood pressure 139/78 mmHg and O2 sat 100% on room air.  The patient received 20 mg of famotidine IVPB and GI was consulted.  White count is 7.4, hemoglobin 8.9 g/dL platelets 286.  CMP shows potassium of 3.2 mmol/L.  Glucose 104 and calcium 8.8 mg/dL.  Calcium is normal when corrected to albumin of 3.2 g/dL.  Total protein was 6.3.  The rest of the CMP was within normal limits.  Review of Systems: As per HPI otherwise 10 point review of systems negative.    Past Medical History:  Diagnosis Date  . Cancer ([place]) 2015   rectal  . Chronic back pain   . COPD (chronic obstructive pulmonary disease) ([place])   . GERD (gastroesophageal reflux disease)   . Hematemesis   . Hemorrhoids   . Hypotension   . Melena   . Stroke [facility])     Past Surgical History:  Procedure Laterality  Date  . BACK SURGERY    . ESOPHAGOGASTRODUODENOSCOPY N/A 06/09/2016   Procedure: ESOPHAGOGASTRODUODENOSCOPY (EGD);  Surgeon: Wilford Corner, MD;  Location: [place];  Service: Endoscopy;  Laterality: N/A;  . ESOPHAGOGASTRODUODENOSCOPY (EGD) WITH PROPOFOL Left 11/21/2016   Procedure: ESOPHAGOGASTRODUODENOSCOPY (EGD) WITH PROPOFOL;  Surgeon: Arta Silence, MD;  Location: [facility] ENDOSCOPY;  Service: Endoscopy;  Laterality: Left;  . HEMORRHOID SURGERY    . HERNIA REPAIR    . RECTAL SURGERY         reports that he quit smoking about 8 years ago. He started smoking about 52 years ago. He quit smokeless tobacco use about 12 years ago.  His smokeless tobacco use included snuff. He reports that he does not drink alcohol or use drugs.  No Known Allergies  Family History  Problem Relation Age of Onset  . COPD Mother   . Stroke Father    Prior to Admission medications   Medication Sig Start Date End Date Taking? Authorizing Provider  amLODipine (NORVASC) 2.5 MG tablet TAKE 1 TABLET DAILY 08/24/17   Dettinger, [name], MD  aspirin EC 325 MG tablet Take 1 tablet (325 mg total) by mouth daily. 06/22/17   Venancio Poisson, NP  atorvastatin (LIPITOR) 80 MG tablet Take 1 tablet (80 mg total) by mouth daily at 6 PM. 06/09/17   Dettinger, [name], MD  diclofenac sodium (VOLTAREN) 1 % GEL Apply 2 g topically 4 (four) times daily. 10/06/17 11/05/17  Baruch Gouty, FNP  fluticasone (FLONASE) 50 MCG/ACT nasal spray Place 1 spray into both nostrils 2 (two) times daily as needed for allergies or rhinitis. 06/20/17   Dettinger, [name], MD  pantoprazole (PROTONIX) 40 MG tablet Take 1 tablet (40 mg total) by mouth 2 (two) times daily. 10/10/17   Deno Etienne, DO  tetrahydrozoline 0.05 % ophthalmic solution Place 1 drop daily as needed into both eyes (itching).    [provider]  tiZANidine (ZANAFLEX) 2 MG tablet Take 1 tablet (2 mg total) by mouth every 6 (six) hours as needed for muscle spasms. 10/06/17   Baruch Gouty, FNP  triamcinolone cream (KENALOG) 0.1 % Apply 1 application topically 2 (two) times daily. 04/08/17   Dettinger, [name], MD    Physical Exam: Vitals:   10/12/17 1127 10/12/17 1330  BP: 139/78 (!) 147/71  Pulse: 68 (!) 56  Resp: 14 13  Temp: 98.5 F (36.9 C)   TempSrc: Oral   SpO2: 100% 100%    Constitutional: NAD, calm, comfortable Eyes: PERRL, lids and conjunctivae are pale. ENMT: Mucous membranes are moist. Posterior pharynx clear of any exudate or lesions.  Dentition is partially absent. Neck: Normal, supple, no masses, no thyromegaly Respiratory: Clear to auscultation bilaterally, no wheezing, no crackles. Normal respiratory effort. No accessory muscle use.  Cardiovascular: Bradycardic at 59 bpm, no murmurs / rubs / gallops. No extremity edema. 2+ pedal pulses. No carotid bruits.  Abdomen: Soft, no tenderness, no masses palpated. No hepatosplenomegaly. Bowel sounds positive.  Musculoskeletal: no clubbing / cyanosis.  Good ROM, no contractures. Normal muscle tone.  Skin: no rashes, lesions, ulcers. No induration on limited dermatological examination. Neurologic: CN 2-12 grossly intact. Sensation intact, DTR normal. Strength 5/5 in all 4.  Psychiatric: Normal judgment and insight. Alert and oriented x 4. Normal mood.   Labs on Admission: I have personally reviewed following labs and imaging studies  CBC: Recent Labs  Lab 10/10/17 1139 10/12/17 1136  WBC 8.6 7.4  HGB 10.2* 8.9*  HCT 32.1* 27.9*  MCV 91.7 91.8  PLT 300 144   Basic Metabolic Panel: Recent Labs  Lab 10/10/17 1139 10/12/17 1136  NA 138 139  K 3.5 3.2*  CL 109 109  CO2 23 25  GLUCOSE 95 104*  BUN 37* 20  CREATININE 0.93 1.11  CALCIUM 8.8* 8.8*   GFR: Estimated Creatinine Clearance: 72 mL/min (by C-G formula based on SCr of 1.11 mg/dL). Liver Function Tests: Recent Labs  Lab 10/10/17 1139 10/12/17 1136  AST 12* 18  ALT 12 15  ALKPHOS 70 67  BILITOT 0.5 0.4  PROT 6.6 6.3*  ALBUMIN 3.3* 3.2*   No results for input(s): LIPASE, AMYLASE in the last 168 hours. No results for input(s): AMMONIA in the last 168 hours. Coagulation Profile: No results for input(s): INR, PROTIME in the last 168 hours. Cardiac Enzymes: No results for input(s): CKTOTAL, CKMB, CKMBINDEX, TROPONINI in the last 168 hours. BNP (last 3 results) No results for input(s): PROBNP in the last 8760 hours. HbA1C: No results for input(s): HGBA1C in the last 72 hours. CBG: No results for  input(s): GLUCAP in the last 168 hours. Lipid Profile: No results for input(s): CHOL, HDL, LDLCALC, TRIG, CHOLHDL, LDLDIRECT in the last 72 hours. Thyroid Function Tests: No results for input(s): TSH, T4TOTAL, FREET4, T3FREE, THYROIDAB in the last 72 hours. Anemia Panel: No results for input(s): VITAMINB12, FOLATE, FERRITIN, TIBC, IRON, RETICCTPCT in the last 72 hours. Urine analysis:    Component Value Date/Time   COLORURINE YELLOW 02/26/2007 1901   APPEARANCEUR Clear 07/16/2017 0828   LABSPEC 1.003 (L) 02/26/2007 1901   PHURINE 6.5 02/26/2007 1901   GLUCOSEU Negative 07/16/2017 0828   HGBUR NEGATIVE 02/26/2007 1901   BILIRUBINUR Negative 07/16/2017 0828   KETONESUR NEGATIVE 02/26/2007 1901   PROTEINUR Negative 07/16/2017 0828   PROTEINUR NEGATIVE 02/26/2007 1901   UROBILINOGEN 0.2 02/26/2007 1901   NITRITE Negative 07/16/2017 0828   NITRITE NEGATIVE 02/26/2007 1901   LEUKOCYTESUR Negative 07/16/2017 0828    Radiological Exams on Admission: No results found.   05/25/2017 echocardiogram complete ------------------------------------------------------------------- LV EF: 60% -   65%  ------------------------------------------------------------------- Indications:      CVA 436.  ------------------------------------------------------------------- History:   PMH:   Chronic obstructive pulmonary disease.  Risk factors:  Hypertension.  ------------------------------------------------------------------- Study Conclusions  - Left ventricle: The cavity size was normal. There was mild   concentric hypertrophy. Systolic function was normal. The   estimated ejection fraction was in the range of 60% to 65%. Wall   motion was normal; there were no regional wall motion   abnormalities. There was an increased relative contribution of   atrial contraction to ventricular filling. Doppler parameters are   consistent with abnormal left ventricular relaxation (grade 1   diastolic  dysfunction). Doppler parameters are consistent with   high ventricular filling pressure. - Mitral valve: There was mild regurgitation. - Left atrium: The atrium was mildly dilated. - Pulmonary arteries: Systolic pressure could not be accurately   estimated. - Pericardium, extracardiac: A small, free-flowing pericardial   effusion was identified posterior to the heart. The fluid had no   internal echoes.  EKG: Independently reviewed.  Vent. rate 61 BPM PR interval * ms QRS duration 96 ms QT/QTc 400/403 ms P-R-T axes 19 38 -10 Sinus rhythm Borderline provide fixation abnormality.  Assessment/Plan Principal Problem:   Melena Observation/telemetry. Keep n.p.o. Continue IV fluids. Continue famotidine 20 mg IVPB every 12 hours. Continue PPI. [facility] has been consulted and will evaluate the patient.  Active Problems:  Anemia As above. Monitor H&H.    COPD (chronic obstructive pulmonary disease) (HCC) Supplemental oxygen and bronchodilators as needed    GERD (gastroesophageal reflux disease) Continue famotidine 20 mg IVP every 12 hours.    Hypertension Hold oral amlodipine. Hydralazine 10 mg IVP every 4 hours as needed for SBP > 160 mmHg.Marland Kitchen    Hypokalemia Replacing. Check magnesium level. Follow-up potassium level.    DVT prophylaxis: SCDs. Code Status: Full code. Family Communication: His wife and daughter were present in the ED room. Disposition Plan: Observation for H&H monitoring and GI evaluation. Consults called: Gastroenterology was consulted by the ED. Admission status: Observation/telemetry.   Reubin Milan MD [facility] Pager [phone].  If 7PM-7AM, please contact night-coverage [email or web address] Password [email or web address]  10/12/2017, 2:22 PM

## 2017-10-13 ENCOUNTER — Encounter (HOSPITAL_COMMUNITY): Payer: Self-pay

## 2017-10-13 ENCOUNTER — Encounter (HOSPITAL_COMMUNITY)
Admission: EM | Disposition: A | Discharge status: Home / Self Care | Payer: Self-pay | Source: Home / Self Care | Attending: Emergency Medicine

## 2017-10-13 DIAGNOSIS — I10 Essential (primary) hypertension: Secondary | ICD-10-CM | POA: Diagnosis not present

## 2017-10-13 DIAGNOSIS — K921 Melena: Secondary | ICD-10-CM | POA: Diagnosis not present

## 2017-10-13 DIAGNOSIS — D62 Acute posthemorrhagic anemia: Secondary | ICD-10-CM | POA: Diagnosis not present

## 2017-10-13 DIAGNOSIS — K219 Gastro-esophageal reflux disease without esophagitis: Secondary | ICD-10-CM | POA: Diagnosis not present

## 2017-10-13 HISTORY — PX: BIOPSY: SHX5522

## 2017-10-13 HISTORY — PX: ESOPHAGOGASTRODUODENOSCOPY (EGD) WITH PROPOFOL: SHX5813

## 2017-10-13 LAB — COMPREHENSIVE METABOLIC PANEL
ALBUMIN: 2.8 g/dL — AB (ref 3.5–5.0)
ALK PHOS: 69 U/L (ref 38–126)
ALT: 14 U/L (ref 0–44)
ANION GAP: 4 — AB (ref 5–15)
AST: 14 U/L — ABNORMAL LOW (ref 15–41)
BILIRUBIN TOTAL: 0.4 mg/dL (ref 0.3–1.2)
BUN: 11 mg/dL (ref 8–23)
CALCIUM: 8.3 mg/dL — AB (ref 8.9–10.3)
CO2: 24 mmol/L (ref 22–32)
Chloride: 113 mmol/L — ABNORMAL HIGH (ref 98–111)
Creatinine, Ser: 0.96 mg/dL (ref 0.61–1.24)
GFR calc non Af Amer: >60 mL/min (ref >60)
Glucose, Bld: 105 mg/dL — ABNORMAL HIGH (ref 70–99)
Potassium: 3.4 mmol/L — ABNORMAL LOW (ref 3.5–5.1)
Sodium: 141 mmol/L (ref 135–145)
TOTAL PROTEIN: 5.7 g/dL — AB (ref 6.5–8.1)

## 2017-10-13 LAB — CBC WITH DIFFERENTIAL/PLATELET
Abs Immature Granulocytes: 0 10*3/uL (ref 0.0–0.1)
Basophils Absolute: 0.1 10*3/uL (ref 0.0–0.1)
Basophils Relative: 1 %
EOS PCT: 3 %
Eosinophils Absolute: 0.2 10*3/uL (ref 0.0–0.7)
HEMATOCRIT: 25 % — AB (ref 39.0–52.0)
HEMOGLOBIN: 8.1 g/dL — AB (ref 13.0–17.0)
Immature Granulocytes: 0 %
LYMPHS ABS: 1.8 10*3/uL (ref 0.7–4.0)
LYMPHS PCT: 34 %
MCH: 29.1 pg (ref 26.0–34.0)
MCHC: 32.4 g/dL (ref 30.0–36.0)
MCV: 89.9 fL (ref 78.0–100.0)
MONO ABS: 0.4 10*3/uL (ref 0.1–1.0)
Monocytes Relative: 8 %
NEUTROS PCT: 54 %
Neutro Abs: 2.8 10*3/uL (ref 1.7–7.7)
Platelets: 235 10*3/uL (ref 150–400)
RBC: 2.78 MIL/uL — ABNORMAL LOW (ref 4.22–5.81)
RDW: 12.9 % (ref 11.5–15.5)
WBC: 5.3 10*3/uL (ref 4.0–10.5)

## 2017-10-13 LAB — CBC
HCT: 24.8 % — ABNORMAL LOW (ref 39.0–52.0)
HEMOGLOBIN: 8 g/dL — AB (ref 13.0–17.0)
MCH: 29.2 pg (ref 26.0–34.0)
MCHC: 32.3 g/dL (ref 30.0–36.0)
MCV: 90.5 fL (ref 78.0–100.0)
PLATELETS: 265 10*3/uL (ref 150–400)
RBC: 2.74 MIL/uL — ABNORMAL LOW (ref 4.22–5.81)
RDW: 13.1 % (ref 11.5–15.5)
WBC: 5.2 10*3/uL (ref 4.0–10.5)

## 2017-10-13 LAB — HEMOGLOBIN AND HEMATOCRIT, BLOOD
HEMATOCRIT: 24.7 % — AB (ref 39.0–52.0)
HEMOGLOBIN: 7.9 g/dL — AB (ref 13.0–17.0)

## 2017-10-13 LAB — HIV ANTIBODY (ROUTINE TESTING W REFLEX): HIV Screen 4th Generation wRfx: NONREACTIVE

## 2017-10-13 SURGERY — ESOPHAGOGASTRODUODENOSCOPY (EGD) WITH PROPOFOL
Anesthesia: Moderate Sedation

## 2017-10-13 MED ORDER — FENTANYL CITRATE (PF) 100 MCG/2ML IJ SOLN
INTRAMUSCULAR | Status: DC | PRN
  Administered 2017-10-13 (×2): 25 ug via INTRAVENOUS

## 2017-10-13 MED ORDER — MIDAZOLAM HCL 5 MG/ML IJ SOLN
INTRAMUSCULAR | Status: AC
  Filled 2017-10-13: qty 2

## 2017-10-13 MED ORDER — MIDAZOLAM HCL 2 MG/2ML IJ SOLN
INTRAMUSCULAR | Status: DC | PRN
  Administered 2017-10-13: 2 mg via INTRAVENOUS
  Administered 2017-10-13: 1 mg via INTRAVENOUS
  Administered 2017-10-13: 2 mg via INTRAVENOUS

## 2017-10-13 MED ORDER — FENTANYL CITRATE (PF) 100 MCG/2ML IJ SOLN
INTRAMUSCULAR | Status: AC
  Filled 2017-10-13: qty 2

## 2017-10-13 MED ORDER — SODIUM CHLORIDE 0.9 % IV SOLN: INTRAVENOUS | Status: DC

## 2017-10-13 MED ORDER — BUTAMBEN-TETRACAINE-BENZOCAINE 2-2-14 % EX AERO
INHALATION_SPRAY | CUTANEOUS | Status: DC | PRN
  Administered 2017-10-13: 2 via TOPICAL

## 2017-10-13 MED ORDER — LACTATED RINGERS IV SOLN
INTRAVENOUS | Status: DC
  Administered 2017-10-13: 1000 mL via INTRAVENOUS

## 2017-10-13 MED ORDER — POTASSIUM CHLORIDE 10 MEQ/100ML IV SOLN
10.0000 meq | INTRAVENOUS | Status: AC
  Administered 2017-10-13 (×2): 10 meq via INTRAVENOUS
  Filled 2017-10-13 (×2): qty 100

## 2017-10-13 SURGICAL SUPPLY — 15 items

## 2017-10-13 NOTE — Op Note (Signed)
[facility] Patient Name: Todd Berg Procedure Date : 10/13/2017 MRN: [id number] Attending MD: Nancy Fetter Dr., MD Date of Birth: October 04, 1950 CSN: [id number] Age: 67 Admit Type: Inpatient Procedure:                Upper GI endoscopy Indications:              Melena Providers:                Jeneen Rinks L. Lewanna Petrak Dr., MD, Burtis Junes, RN, Tinnie Gens, Technician Referring MD:              Medicines:                Fentanyl 50 micrograms IV, Midazolam 5 mg IV,                            Cetacaine spray Complications:            No immediate complications. Estimated Blood Loss:     Estimated blood loss: none. Procedure:                Pre-Anesthesia Assessment:                           - Prior to the procedure, a History and Physical                            was performed, and patient medications and                            allergies were reviewed. The patient's tolerance of                            previous anesthesia was also reviewed. The risks                            and benefits of the procedure and the sedation                            options and risks were discussed with the patient.                            All questions were answered, and informed consent                            was obtained. Prior Anticoagulants: The patient has                            taken no previous anticoagulant or antiplatelet                            agents. ASA Grade Assessment: III - A patient with  severe systemic disease. After reviewing the risks                            and benefits, the patient was deemed in                            satisfactory condition to undergo the procedure.                           After obtaining informed consent, the endoscope was                            passed under direct vision. Throughout the                            procedure, the patient's blood pressure, pulse, and                             oxygen saturations were monitored continuously. The                            GIF-H190 ([id number]) Olympus adult EGD was introduced                            through the mouth, and advanced to the second part                            of duodenum. The upper GI endoscopy was                            accomplished without difficulty. The patient                            tolerated the procedure well. Scope In: Scope Out: Findings:      A large hiatal hernia was present.      One cratered gastric ulcer was found in the prepyloric region of the       stomach. The lesion was 8 mm in largest dimension. Biopsies were taken       with a cold forceps for histology. Biopsies were taken with a cold       forceps for histology.      The examined duodenum was normal. Impression:               - Large hiatal hernia.                           - Gastric ulcer. Biopsied.                           - Normal examined duodenum. Recommendation:           - Return patient to hospital ward for ongoing care.                           - Full liquid diet.                           -  Return to endoscopist in 2 months. Procedure Code(s):        --- Professional ---                           [phone], Esophagogastroduodenoscopy, flexible,                            transoral; with biopsy, single or multiple Diagnosis Code(s):        --- Professional ---                           K25.9, Gastric ulcer, unspecified as acute or                            chronic, without hemorrhage or perforation                           K92.1, Melena (includes Hematochezia) CPT copyright 2017 American Medical Association. All rights reserved. The codes documented in this report are preliminary and upon coder review may  be revised to meet current compliance requirements. Nancy Fetter Dr., MD 10/13/2017 3:29:28 PM This report has been signed electronically. Number of Addenda: 0

## 2017-10-13 NOTE — Plan of Care (Signed)
  Problem: Education: Goal: Knowledge of General Education information will improve Description Including pain rating scale, medication(s)/side effects and non-pharmacologic comfort measures Outcome: Progressing   Problem: Health Behavior/Discharge Planning: Goal: Ability to manage health-related needs will improve Outcome: Progressing   Problem: Clinical Measurements: Goal: Ability to maintain clinical measurements within normal limits will improve Outcome: Progressing Goal: Will remain free from infection Outcome: Progressing Goal: Diagnostic test results will improve Outcome: Progressing Goal: Respiratory complications will improve Outcome: Progressing Goal: Cardiovascular complication will be avoided Outcome: Progressing   Problem: Activity: Goal: Risk for activity intolerance will decrease Outcome: Progressing   Problem: Nutrition: Goal: Adequate nutrition will be maintained Outcome: Progressing   Problem: Coping: Goal: Level of anxiety will decrease Outcome: Progressing   Problem: Elimination: Goal: Will not experience complications related to bowel motility Outcome: Progressing Goal: Will not experience complications related to urinary retention Outcome: Progressing   Problem: Pain Managment: Goal: General experience of comfort will improve Outcome: Progressing   Problem: Safety: Goal: Ability to remain free from injury will improve Outcome: Progressing   Problem: Skin Integrity: Goal: Risk for impaired skin integrity will decrease Outcome: Progressing   Problem: Bowel/Gastric: Goal: Will show no signs and symptoms of gastrointestinal bleeding Outcome: Progressing

## 2017-10-13 NOTE — Progress Notes (Signed)
PROGRESS NOTE [facility]   DSHAWN MCNAY   PIR:[id number] DOB: 25-May-1950  DOA: 10/12/2017 PCP: Dettinger, [name], MD   Brief Narrative:  Todd Berg is a 67 year old male with past medical history that includes GERD w/ esophagitis, PUD w/ gastric ulcers noted on prior upper EGD, rectal cancer, CVA, COPD, and chronic back pain. Patient had been seen in the ED 3 days ago (09/30) for melena and was hemoccult positive, but his Hgb was stable and he was discharged after it was determined that he would be safe for outpatient follow-up. Yesterday (10/02) he returned to the ED with worsening melena and his Hgb had dropped to 8.9. Patient was admitted for anemia secondary to persistent GI bleeding.   Subjective: Claims he did have one episode of stools last night and it was not melanotic.  Acknowledges NSAID use over the past few weeks.  Assessment & Plan: Upper GI bleeding with acute blood loss anemia: Known history of peptic ulcer disease-unfortunately was using NSAIDs for the past few weeks.  Continue PPI, awaiting endoscopic evaluation.  Hemoglobin all the low-but relatively stable-has not required PRBC transfusion.  Follow CBC every 12 for now-and transfuse if significant drop in hemoglobin  Prior history of peptic ulcer disease/esophagitis: Continue PPI and Pepcid.  Await EGD  History of CVA: Appears to have non-focal-antiplatelets on hold due to ongoing GI bleed.  Left-sided cervical muscle strain, chronic:Continue outpatient management of Voltaren 1% topical gel and robaxin PRN.   COPD:Controlled with no evidence of exacerbation. Supplemental oxygen and bronchodilators available PRN.   Hypertension: Blood pressure currently controlled-all antihypertensives on hold.  Hypokalemia: Replete and recheck  DVT prophylaxis: SCDs Code Status: Full-code Family Communication: None at bedside Disposition Plan: Remain inpatient-home once GI bleeding has resolved-and work-up  complete.   Consultants:   Gastroenterology.  Procedures:   Upper EGD today.  Antimicrobials:  None.   Objective: Vitals:   10/12/17 1430 10/12/17 1637 10/12/17 2050 10/13/17 0617  BP: (!) 162/76 (!) 148/68 (!) 153/75 (!) 143/79  Pulse: 63  60 68  Resp: 15 16 18 20   Temp:  97.7 F (36.5 C) (!) 97.5 F (36.4 C) 98.1 F (36.7 C)  TempSrc:  Oral Oral   SpO2: 100% 100% 100% 100%  Weight:  89 kg    Height:  6' (1.829 m)      Intake/Output Summary (Last 24 hours) at 10/13/2017 0806 Last data filed at 10/13/2017 0300 Gross per 24 hour  Intake 1248.78 ml  Output -  Net 1248.78 ml   Filed Weights   10/12/17 1637  Weight: 89 kg    Examination:  General exam: Appears calm and comfortable  Respiratory system: Clear to auscultation. No wheezes, crackles, or rhonchi Cardiovascular system: S1 & S2 heard, RRR. No JVD, murmurs, rubs, or gallops. Gastrointestinal system: Abdomen is nondistended, soft and nontender. No organomegaly or masses felt. Normal bowel sounds heard. Central nervous system: Alert and oriented. No focal neurological deficits. Extremities: No pedal edema.    Skin: No rashes, lesions or ulcers Psychiatry: Judgment and insight appear normal. Mood & affect appropriate.    Data Reviewed: I have personally reviewed following labs and imaging studies  CBC: Recent Labs  Lab 10/10/17 1139 10/12/17 1136 10/12/17 1658 10/12/17 2255 10/13/17 0341  WBC 8.6 7.4  --   --  5.3  NEUTROABS  --   --   --   --  2.8  HGB 10.2* 8.9* 8.5* 7.9* 8.1*  HCT 32.1* 27.9* 26.7* 24.7*  25.0*  MCV 91.7 91.8  --   --  89.9  PLT 300 286  --   --  211   Basic Metabolic Panel: Recent Labs  Lab 10/10/17 1139 10/12/17 1136 10/12/17 1658 10/13/17 0341  NA 138 139  --  141  K 3.5 3.2*  --  3.4*  CL 109 109  --  113*  CO2 23 25  --  24  GLUCOSE 95 104*  --  105*  BUN 37* 20  --  11  CREATININE 0.93 1.11  --  0.96  CALCIUM 8.8* 8.8*  --  8.3*  MG  --   --  2.0  --     PHOS  --   --  3.3  --    GFR: Estimated Creatinine Clearance: 80.8 mL/min (by C-G formula based on SCr of 0.96 mg/dL). Liver Function Tests: Recent Labs  Lab 10/10/17 1139 10/12/17 1136 10/13/17 0341  AST 12* 18 14*  ALT 12 15 14   ALKPHOS 70 67 69  BILITOT 0.5 0.4 0.4  PROT 6.6 6.3* 5.7*  ALBUMIN 3.3* 3.2* 2.8*   No results for input(s): LIPASE, AMYLASE in the last 168 hours. No results for input(s): AMMONIA in the last 168 hours. Coagulation Profile: No results for input(s): INR, PROTIME in the last 168 hours. Cardiac Enzymes: No results for input(s): CKTOTAL, CKMB, CKMBINDEX, TROPONINI in the last 168 hours. BNP (last 3 results) No results for input(s): PROBNP in the last 8760 hours. HbA1C: No results for input(s): HGBA1C in the last 72 hours. CBG: No results for input(s): GLUCAP in the last 168 hours. Lipid Profile: No results for input(s): CHOL, HDL, LDLCALC, TRIG, CHOLHDL, LDLDIRECT in the last 72 hours. Thyroid Function Tests: No results for input(s): TSH, T4TOTAL, FREET4, T3FREE, THYROIDAB in the last 72 hours. Anemia Panel: No results for input(s): VITAMINB12, FOLATE, FERRITIN, TIBC, IRON, RETICCTPCT in the last 72 hours. Sepsis Labs: No results for input(s): PROCALCITON, LATICACIDVEN in the last 168 hours.  No results found for this or any previous visit (from the past 240 hour(s)).    Radiology Studies: No results found.    Scheduled Meds: . Influenza vac split quadrivalent PF  0.5 mL Intramuscular Tomorrow-1000  . pantoprazole  40 mg Oral BID  . pneumococcal 23 valent vaccine  0.5 mL Intramuscular Tomorrow-1000   Continuous Infusions: . 0.9 % NaCl with KCl 20 mEq / L 100 mL/hr at 10/13/17 0130  . famotidine (PEPCID) IV 20 mg (10/12/17 2108)     LOS: 0 days    Time spent: Total of 30 minutes spent with pt, greater than 50% of which was spent in discussion of  treatment, counseling and coordination of care  Nena Alexander MD Pager: Text Page via  [email or web address]   If 7PM-7AM, please contact night-coverage [email or web address] 10/13/2017, 8:06 AM

## 2017-10-13 NOTE — Interval H&P Note (Signed)
History and Physical Interval Note:  10/13/2017 2:50 PM  Todd Berg  has presented today for surgery, with the diagnosis of GI bleed  The various methods of treatment have been discussed with the patient and family. After consideration of risks, benefits and other options for treatment, the patient has consented to  Procedure(s): ESOPHAGOGASTRODUODENOSCOPY (EGD) WITH PROPOFOL (N/A) as a surgical intervention .  The patient's history has been reviewed, patient examined, no change in status, stable for surgery.  I have reviewed the patient's chart and labs.  Questions were answered to the patient's satisfaction.     Nancy Fetter

## 2017-10-14 DIAGNOSIS — I10 Essential (primary) hypertension: Secondary | ICD-10-CM | POA: Diagnosis not present

## 2017-10-14 DIAGNOSIS — K921 Melena: Secondary | ICD-10-CM | POA: Diagnosis not present

## 2017-10-14 DIAGNOSIS — K219 Gastro-esophageal reflux disease without esophagitis: Secondary | ICD-10-CM | POA: Diagnosis not present

## 2017-10-14 LAB — CBC
HEMATOCRIT: 26.9 % — AB (ref 39.0–52.0)
HEMOGLOBIN: 8.7 g/dL — AB (ref 13.0–17.0)
MCH: 29.1 pg (ref 26.0–34.0)
MCHC: 32.3 g/dL (ref 30.0–36.0)
MCV: 90 fL (ref 78.0–100.0)
Platelets: 254 10*3/uL (ref 150–400)
RBC: 2.99 MIL/uL — AB (ref 4.22–5.81)
RDW: 12.9 % (ref 11.5–15.5)
WBC: 6.8 10*3/uL (ref 4.0–10.5)

## 2017-10-14 MED ORDER — ASPIRIN EC 325 MG PO TBEC: 325.0000 mg | DELAYED_RELEASE_TABLET | Freq: Every day | ORAL | 3 refills | Status: DC

## 2017-10-14 MED ORDER — PANTOPRAZOLE SODIUM 40 MG PO TBEC: 40.0000 mg | DELAYED_RELEASE_TABLET | Freq: Two times a day (BID) | ORAL | 0 refills | Status: DC

## 2017-10-14 NOTE — Care Management Obs Status (Signed)
MEDICARE OBSERVATION STATUS NOTIFICATION   Patient Details  Name: Todd Berg MRN: [id number] Date of Birth: [birth year]   Medicare Observation Status Notification Given:  Yes    Sharin Mons, RN 10/14/2017, 8:34 AM

## 2017-10-14 NOTE — Progress Notes (Signed)
Nsg Discharge Note  Admit Date:  10/12/2017 Discharge date: 10/14/2017   Todd Berg to be D/C'd Home per MD order.  AVS completed.  Copy for chart, and copy for patient signed, and dated. Patient/caregiver able to verbalize understanding.  Discharge Medication: Allergies as of 10/14/2017   No Known Allergies     Medication List    TAKE these medications   amLODipine 2.5 MG tablet Commonly known as:  NORVASC TAKE 1 TABLET DAILY   aspirin EC 325 MG tablet Take 1 tablet (325 mg total) by mouth daily. Start taking on:  10/17/2017 What changed:  These instructions start on 10/17/2017. If you are unsure what to do until then, ask your doctor or other care provider.   diclofenac sodium 1 % Gel Commonly known as:  VOLTAREN Apply 2 g topically 4 (four) times daily.   fluticasone 50 MCG/ACT nasal spray Commonly known as:  FLONASE Place 1 spray into both nostrils 2 (two) times daily as needed for allergies or rhinitis.   pantoprazole 40 MG tablet Commonly known as:  PROTONIX Take 1 tablet (40 mg total) by mouth 2 (two) times daily.   tetrahydrozoline 0.05 % ophthalmic solution Place 1 drop daily as needed into both eyes (itching).   tiZANidine 2 MG tablet Commonly known as:  ZANAFLEX Take 1 tablet (2 mg total) by mouth every 6 (six) hours as needed for muscle spasms.       Discharge Assessment: Vitals:   10/13/17 2043 10/14/17 0448  BP: (!) 155/69 135/72  Pulse: 66 (!) 104  Resp: 18 18  Temp: (!) 97.4 F (36.3 C) 98.8 F (37.1 C)  SpO2: 100% 97%   Skin clean, dry and intact without evidence of skin break down, no evidence of skin tears noted. IV catheter discontinued intact. Site without signs and symptoms of complications - no redness or edema noted at insertion site, patient denies c/o pain - only slight tenderness at site.  Dressing with slight pressure applied.  D/c Instructions-Education: Discharge instructions given to patient/family with verbalized  understanding. D/c education completed with patient/family including follow up instructions, medication list, d/c activities limitations if indicated, with other d/c instructions as indicated by MD - patient able to verbalize understanding, all questions fully answered. Patient instructed to return to ED, call 911, or call MD for any changes in condition.  Patient escorted via [place], and D/C home via private auto.  Cristela Blue, RN 10/14/2017 10:43 AM

## 2017-10-14 NOTE — Discharge Summary (Signed)
PATIENT DETAILS Name: Todd Berg Age: 67 y.o. Sex: male Date of Birth: [birth year] MRN: [id number]. Admitting Physician: Reubin Milan, MD FUX:[id number], [name], MD  Admit Date: 10/12/2017 Discharge date: 10/14/2017  Recommendations for Outpatient Follow-up:  1. Follow up with PCP in 1-2 weeks 2. Please obtain BMP/CBC in one week 3. Please follow up on the following pending results:  Admitted From:  Home  Disposition: [place]: No  Equipment/Devices: None  Discharge Condition: Stable  CODE STATUS: FULL CODE  Diet recommendation:  Heart Healthy  Brief Summary: See H&P, Labs, Consult and Test reports for all details in brief, Todd Berg is a 67 year old male with past medical history that includes GERD w/ esophagitis, PUD w/ gastric ulcers noted on prior upper EGD, rectal cancer, CVA, COPD, and chronic back pain. Patient had been seen in the ED 3 days ago (09/30) for melena and was hemoccult positive, but his Hgb was stable and he was discharged after it was determined that he would be safe for outpatient follow-up. Yesterday (10/02) he returned to the ED with worsening melena and his Hgb had dropped to 8.9. Patient was admitted for anemia secondary to persistent GI bleeding.   Brief Hospital Course: Upper GI bleeding with acute blood loss anemia: Known history of peptic ulcer disease-unfortunately was using NSAIDs for the past few weeks.    Started on PPI-underwent EGD on 10/3 which showed a small gastric ulcer that was nonbleeding.  Hemoglobin remained stable without the need for transfusion.  Spoke with GI-recommendations are to continue PPI and resume aspirin on 10/7.  Stable for discharge with outpatient follow-up with GI in the next month or so for a second look endoscopy.  Patient has been counseled not to use NSAIDs in the future.  Prior history of peptic ulcer disease/esophagitis: Continue PPI-see above  History of CVA: Appears to have  non-focal-antiplatelets held initially-spoke with GI-okay to resume on 10/7.   Left-sided cervical muscle strain, chronic:Continue outpatient management of Voltaren 1% topical gel and robaxin PRN.   COPD:Controlled with no evidence of exacerbation.   Continue usual bronchodilators.   Hypertension: Blood pressure currently controlled-amlodipine will be resumed on discharge  Hypokalemia: Repleted-recheck electrolytes in 1 week at PCP's office  Procedures/Studies: EGD 10/3>> - Large hiatal hernia. - Gastric ulcer. Biopsied. - Normal examined duodenum.  Discharge Diagnoses:  Principal Problem:   Melena Active Problems:   COPD (chronic obstructive pulmonary disease) (HCC)   GERD (gastroesophageal reflux disease)   Hypertension   Anemia   Hypokalemia   Sinus bradycardia   Discharge Instructions:  Activity:  As tolerated   Discharge Instructions    Call MD for:  persistant dizziness or light-headedness   Complete by:  As directed    Diet - low sodium heart healthy   Complete by:  As directed    Discharge instructions   Complete by:  As directed    Follow with Primary MD  Dettinger, [name], MD in 1 week  Follow with [facility] in 2 weeks  Please get a complete blood count and chemistry panel checked by your Primary MD at your next visit, and again as instructed by your Primary MD.  Get Medicines reviewed and adjusted: Please take all your medications with you for your next visit with your Primary MD  Laboratory/radiological data: Please request your Primary MD to go over all hospital tests and procedure/radiological results at the follow up, please ask your Primary MD to get all  Hospital records sent to his/her office.  In some cases, they will be blood work, cultures and biopsy results pending at the time of your discharge. Please request that your primary care M.D. follows up on these results.  Also Note the following: If you experience worsening of your admission  symptoms, develop shortness of breath, life threatening emergency, suicidal or homicidal thoughts you must seek medical attention immediately by calling 911 or calling your MD immediately  if symptoms less severe.  You must read complete instructions/literature along with all the possible adverse reactions/side effects for all the Medicines you take and that have been prescribed to you. Take any new Medicines after you have completely understood and accpet all the possible adverse reactions/side effects.   Do not drive when taking Pain medications or sleeping medications (Benzodaizepines)  Do not take more than prescribed Pain, Sleep and Anxiety Medications. It is not advisable to combine anxiety,sleep and pain medications without talking with your primary care practitioner  Special Instructions: If you have smoked or chewed Tobacco  in the last 2 yrs please stop smoking, stop any regular Alcohol  and or any Recreational drug use.  Wear Seat belts while driving.  Please note: You were cared for by a hospitalist during your hospital stay. Once you are discharged, your primary care physician will handle any further medical issues. Please note that NO REFILLS for any discharge medications will be authorized once you are discharged, as it is imperative that you return to your primary care physician (or establish a relationship with a primary care physician if you do not have one) for your post hospital discharge needs so that they can reassess your need for medications and monitor your lab values.   Increase activity slowly   Complete by:  As directed      Allergies as of 10/14/2017   No Known Allergies     Medication List    TAKE these medications   amLODipine 2.5 MG tablet Commonly known as:  NORVASC TAKE 1 TABLET DAILY   aspirin EC 325 MG tablet Take 1 tablet (325 mg total) by mouth daily. Start taking on:  10/17/2017 What changed:  These instructions start on 10/17/2017. If you are  unsure what to do until then, ask your doctor or other care provider.   diclofenac sodium 1 % Gel Commonly known as:  VOLTAREN Apply 2 g topically 4 (four) times daily.   fluticasone 50 MCG/ACT nasal spray Commonly known as:  FLONASE Place 1 spray into both nostrils 2 (two) times daily as needed for allergies or rhinitis.   pantoprazole 40 MG tablet Commonly known as:  PROTONIX Take 1 tablet (40 mg total) by mouth 2 (two) times daily.   tetrahydrozoline 0.05 % ophthalmic solution Place 1 drop daily as needed into both eyes (itching).   tiZANidine 2 MG tablet Commonly known as:  ZANAFLEX Take 1 tablet (2 mg total) by mouth every 6 (six) hours as needed for muscle spasms.      Follow-up Information    Dettinger, [name], MD. Schedule an appointment as soon as possible for a visit in 1 week(s).   Specialties:  Family Medicine, Cardiology Contact information: [place] [address] [phone]        Gastroenterology, [place] [place]. Schedule an appointment as soon as possible for a visit in 1 month(s).   Why:  follow with Dr Nelson Chimes information: [place] [address] [phone]  No Known Allergies  Consultations:   GI  Other Procedures/Studies: No results found.   TODAY-DAY OF DISCHARGE:  Subjective:   Todd Berg today has no headache,no chest abdominal pain,no new weakness tingling or numbness, feels much better wants to go home today.   Objective:   Blood pressure 135/72, pulse (!) 104, temperature 98.8 F (37.1 C), temperature source Oral, resp. rate 18, height 6' (1.829 m), weight 89 kg, SpO2 97 %.  Intake/Output Summary (Last 24 hours) at 10/14/2017 0833 Last data filed at 10/14/2017 0600 Gross per 24 hour  Intake 1140 ml  Output -  Net 1140 ml   Filed Weights   10/12/17 1637  Weight: 89 kg    Exam: Awake Alert, Oriented *3, No new F.N deficits, Normal affect [place].AT,PERRAL Supple Neck,No  JVD, No cervical lymphadenopathy appriciated.  Symmetrical Chest wall movement, Good air movement bilaterally, CTAB RRR,No Gallops,Rubs or new Murmurs, No Parasternal Heave +ve B.Sounds, Abd Soft, Non tender, No organomegaly appriciated, No rebound -guarding or rigidity. No Cyanosis, Clubbing or edema, No new Rash or bruise   PERTINENT RADIOLOGIC STUDIES: No results found.   PERTINENT LAB RESULTS: CBC: Recent Labs    10/13/17 1217 10/14/17 0034  WBC 5.2 6.8  HGB 8.0* 8.7*  HCT 24.8* 26.9*  PLT 265 254   CMET CMP     Component Value Date/Time   NA 141 10/13/2017 0341   NA 142 03/17/2017 1050   K 3.4 (L) 10/13/2017 0341   CL 113 (H) 10/13/2017 0341   CO2 24 10/13/2017 0341   GLUCOSE 105 (H) 10/13/2017 0341   BUN 11 10/13/2017 0341   BUN 16 03/17/2017 1050   CREATININE 0.96 10/13/2017 0341   CALCIUM 8.3 (L) 10/13/2017 0341   PROT 5.7 (L) 10/13/2017 0341   PROT 6.8 03/17/2017 1050   ALBUMIN 2.8 (L) 10/13/2017 0341   ALBUMIN 3.9 03/17/2017 1050   AST 14 (L) 10/13/2017 0341   ALT 14 10/13/2017 0341   ALKPHOS 69 10/13/2017 0341   BILITOT 0.4 10/13/2017 0341   BILITOT 0.3 03/17/2017 1050   GFRNONAA >60 10/13/2017 0341   GFRAA >60 10/13/2017 0341    GFR Estimated Creatinine Clearance: 80.8 mL/min (by C-G formula based on SCr of 0.96 mg/dL). No results for input(s): LIPASE, AMYLASE in the last 72 hours. No results for input(s): CKTOTAL, CKMB, CKMBINDEX, TROPONINI in the last 72 hours. Invalid input(s): POCBNP No results for input(s): DDIMER in the last 72 hours. No results for input(s): HGBA1C in the last 72 hours. No results for input(s): CHOL, HDL, LDLCALC, TRIG, CHOLHDL, LDLDIRECT in the last 72 hours. No results for input(s): TSH, T4TOTAL, T3FREE, THYROIDAB in the last 72 hours.  Invalid input(s): FREET3 No results for input(s): VITAMINB12, FOLATE, FERRITIN, TIBC, IRON, RETICCTPCT in the last 72 hours. Coags: No results for input(s): INR in the last 72  hours.  Invalid input(s): PT Microbiology: No results found for this or any previous visit (from the past 240 hour(s)).  FURTHER DISCHARGE INSTRUCTIONS:  Get Medicines reviewed and adjusted: Please take all your medications with you for your next visit with your Primary MD  Laboratory/radiological data: Please request your Primary MD to go over all hospital tests and procedure/radiological results at the follow up, please ask your Primary MD to get all Hospital records sent to his/her office.  In some cases, they will be blood work, cultures and biopsy results pending at the time of your discharge. Please request that your primary care M.D. goes through all  the records of your hospital data and follows up on these results.  Also Note the following: If you experience worsening of your admission symptoms, develop shortness of breath, life threatening emergency, suicidal or homicidal thoughts you must seek medical attention immediately by calling 911 or calling your MD immediately  if symptoms less severe.  You must read complete instructions/literature along with all the possible adverse reactions/side effects for all the Medicines you take and that have been prescribed to you. Take any new Medicines after you have completely understood and accpet all the possible adverse reactions/side effects.   Do not drive when taking Pain medications or sleeping medications (Benzodaizepines)  Do not take more than prescribed Pain, Sleep and Anxiety Medications. It is not advisable to combine anxiety,sleep and pain medications without talking with your primary care practitioner  Special Instructions: If you have smoked or chewed Tobacco  in the last 2 yrs please stop smoking, stop any regular Alcohol  and or any Recreational drug use.  Wear Seat belts while driving.  Please note: You were cared for by a hospitalist during your hospital stay. Once you are discharged, your primary care physician will  handle any further medical issues. Please note that NO REFILLS for any discharge medications will be authorized once you are discharged, as it is imperative that you return to your primary care physician (or establish a relationship with a primary care physician if you do not have one) for your post hospital discharge needs so that they can reassess your need for medications and monitor your lab values.  Total Time spent coordinating discharge including counseling, education and face to face time equals 35 minutes.  SignedOren Binet 10/14/2017 8:33 AM

## 2017-10-14 NOTE — Progress Notes (Signed)
GI Discharge/Sign-Off Note  Subjective: Patient with recurrent gastric ulcer.  Biopsies pending.  He has had this for many months and there is some question about how reliable he is been about taking his medicines.  He tells me that his wife gives him his medicine his wife states that he sometimes does not take.Reinforced to him that he needs to take this and avoid all NSAIDs.  He denies pain or bleeding.  Hemoglobin is stable Principal Problem:   Melena Active Problems:   COPD (chronic obstructive pulmonary disease) (HCC)   GERD (gastroesophageal reflux disease)   Hypertension   Anemia   Hypokalemia   Sinus bradycardia   Results for orders placed or performed during the hospital encounter of 10/12/17 (from the past 72 hour(s))  Comprehensive metabolic panel     Status: Abnormal   Collection Time: 10/12/17 11:36 AM  Result Value Ref Range   Sodium 139 135 - 145 mmol/L   Potassium 3.2 (L) 3.5 - 5.1 mmol/L   Chloride 109 98 - 111 mmol/L   CO2 25 22 - 32 mmol/L   Glucose, Bld 104 (H) 70 - 99 mg/dL   BUN 20 8 - 23 mg/dL   Creatinine, Ser 1.11 0.61 - 1.24 mg/dL   Calcium 8.8 (L) 8.9 - 10.3 mg/dL   Total Protein 6.3 (L) 6.5 - 8.1 g/dL   Albumin 3.2 (L) 3.5 - 5.0 g/dL   AST 18 15 - 41 U/L   ALT 15 0 - 44 U/L   Alkaline Phosphatase 67 38 - 126 U/L   Total Bilirubin 0.4 0.3 - 1.2 mg/dL   GFR calc non Af Amer >60 >60 mL/min   GFR calc Af Amer >60 >60 mL/min    Comment: (NOTE) The eGFR has been calculated using the CKD EPI equation. This calculation has not been validated in all clinical situations. eGFR's persistently <60 mL/min signify possible Chronic Kidney Disease.    Anion gap 5 5 - 15    Comment: Performed at [facility] [address] [place], [place] [address]  CBC     Status: Abnormal   Collection Time: 10/12/17 11:36 AM  Result Value Ref Range   WBC 7.4 4.0 - 10.5 K/uL   RBC 3.04 (L) 4.22 - 5.81 MIL/uL   Hemoglobin 8.9 (L) 13.0 - 17.0 g/dL   HCT 27.9 (L) 39.0  - 52.0 %   MCV 91.8 78.0 - 100.0 fL   MCH 29.3 26.0 - 34.0 pg   MCHC 31.9 30.0 - 36.0 g/dL   RDW 13.0 11.5 - 15.5 %   Platelets 286 150 - 400 K/uL    Comment: Performed at [facility], [place] [address], [place], [place] [address]  Type and screen [place]     Status: None   Collection Time: 10/12/17 11:40 AM  Result Value Ref Range   ABO/RH(D) O NEG    Antibody Screen NEG    Sample Expiration      10/15/2017 Performed at [facility], [place] [address], [place], [place] [address]   Magnesium     Status: None   Collection Time: 10/12/17  4:58 PM  Result Value Ref Range   Magnesium 2.0 1.7 - 2.4 mg/dL    Comment: Performed at [facility], [place] [address] [place], [place] [address]  Hemoglobin and hematocrit, blood     Status: Abnormal   Collection Time: 10/12/17  4:58 PM  Result Value Ref Range  Hemoglobin 8.5 (L) 13.0 - 17.0 g/dL   HCT 26.7 (L) 39.0 - 52.0 %    Comment: Performed at [facility], [place] [address] [place], [place] [address]  Phosphorus     Status: None   Collection Time: 10/12/17  4:58 PM  Result Value Ref Range   Phosphorus 3.3 2.5 - 4.6 mg/dL    Comment: Performed at [facility] [address], [place], [place] [address]  Urinalysis, Routine w reflex microscopic     Status: Abnormal   Collection Time: 10/12/17  5:48 PM  Result Value Ref Range   Color, Urine STRAW (A) YELLOW   APPearance CLEAR CLEAR   Specific Gravity, Urine 1.004 (L) 1.005 - 1.030   pH 6.0 5.0 - 8.0   Glucose, UA NEGATIVE NEGATIVE mg/dL   Hgb urine dipstick NEGATIVE NEGATIVE   Bilirubin Urine NEGATIVE NEGATIVE   Ketones, ur NEGATIVE NEGATIVE mg/dL   Protein, ur NEGATIVE NEGATIVE mg/dL   Nitrite NEGATIVE NEGATIVE   Leukocytes, UA NEGATIVE NEGATIVE    Comment: Performed at [facility] [address], [place], [place] [address]  Hemoglobin and hematocrit, blood     Status: Abnormal   Collection Time: 10/12/17 10:55 PM  Result Value  Ref Range   Hemoglobin 7.9 (L) 13.0 - 17.0 g/dL   HCT 24.7 (L) 39.0 - 52.0 %    Comment: Performed at [facility] [address], [place], [place] [address]  HIV antibody (Routine Testing)     Status: None   Collection Time: 10/13/17  3:41 AM  Result Value Ref Range   HIV Screen 4th Generation wRfx Non Reactive Non Reactive    Comment: (NOTE) Performed At: [facility] [place], [place] [id number] Rush Farmer MD IH:[id number]   CBC WITH DIFFERENTIAL     Status: Abnormal   Collection Time: 10/13/17  3:41 AM  Result Value Ref Range   WBC 5.3 4.0 - 10.5 K/uL   RBC 2.78 (L) 4.22 - 5.81 MIL/uL   Hemoglobin 8.1 (L) 13.0 - 17.0 g/dL   HCT 25.0 (L) 39.0 - 52.0 %   MCV 89.9 78.0 - 100.0 fL   MCH 29.1 26.0 - 34.0 pg   MCHC 32.4 30.0 - 36.0 g/dL   RDW 12.9 11.5 - 15.5 %   Platelets 235 150 - 400 K/uL   Neutrophils Relative % 54 %   Neutro Abs 2.8 1.7 - 7.7 K/uL   Lymphocytes Relative 34 %   Lymphs Abs 1.8 0.7 - 4.0 K/uL   Monocytes Relative 8 %   Monocytes Absolute 0.4 0.1 - 1.0 K/uL   Eosinophils Relative 3 %   Eosinophils Absolute 0.2 0.0 - 0.7 K/uL   Basophils Relative 1 %   Basophils Absolute 0.1 0.0 - 0.1 K/uL   Immature Granulocytes 0 %   Abs Immature Granulocytes 0.0 0.0 - 0.1 K/uL    Comment: Performed at [facility], [place]. [address], [place], [place] [address]  Comprehensive metabolic panel     Status: Abnormal   Collection Time: 10/13/17  3:41 AM  Result Value Ref Range   Sodium 141 135 - 145 mmol/L   Potassium 3.4 (L) 3.5 - 5.1 mmol/L   Chloride 113 (H) 98 - 111 mmol/L   CO2 24 22 - 32 mmol/L   Glucose, Bld 105 (H) 70 - 99 mg/dL   BUN 11 8 - 23 mg/dL   Creatinine, Ser 0.96 0.61 - 1.24  mg/dL   Calcium 8.3 (L) 8.9 - 10.3 mg/dL   Total Protein 5.7 (L) 6.5 - 8.1 g/dL   Albumin 2.8 (L) 3.5 - 5.0 g/dL   AST 14 (L) 15 - 41 U/L   ALT 14 0 - 44 U/L   Alkaline Phosphatase 69 38 - 126 U/L   Total Bilirubin 0.4 0.3 - 1.2 mg/dL   GFR  calc non Af Amer >60 >60 mL/min   GFR calc Af Amer >60 >60 mL/min    Comment: (NOTE) The eGFR has been calculated using the CKD EPI equation. This calculation has not been validated in all clinical situations. eGFR's persistently <60 mL/min signify possible Chronic Kidney Disease.    Anion gap 4 (L) 5 - 15    Comment: Performed at [facility] [address] [place], [place] [address]  CBC     Status: Abnormal   Collection Time: 10/13/17 12:17 PM  Result Value Ref Range   WBC 5.2 4.0 - 10.5 K/uL   RBC 2.74 (L) 4.22 - 5.81 MIL/uL   Hemoglobin 8.0 (L) 13.0 - 17.0 g/dL   HCT 24.8 (L) 39.0 - 52.0 %   MCV 90.5 78.0 - 100.0 fL   MCH 29.2 26.0 - 34.0 pg   MCHC 32.3 30.0 - 36.0 g/dL   RDW 13.1 11.5 - 15.5 %   Platelets 265 150 - 400 K/uL    Comment: Performed at [facility], [place] [address], [place], [place] [address]  CBC     Status: Abnormal   Collection Time: 10/14/17 12:34 AM  Result Value Ref Range   WBC 6.8 4.0 - 10.5 K/uL   RBC 2.99 (L) 4.22 - 5.81 MIL/uL   Hemoglobin 8.7 (L) 13.0 - 17.0 g/dL   HCT 26.9 (L) 39.0 - 52.0 %   MCV 90.0 78.0 - 100.0 fL   MCH 29.1 26.0 - 34.0 pg   MCHC 32.3 30.0 - 36.0 g/dL   RDW 12.9 11.5 - 15.5 %   Platelets 254 150 - 400 K/uL    Comment: Performed at [facility], [place] [address] [place], [place] [address]    No results found.  '@[email or web address]' @  GI DISCHARGE PLANNING:  Diet: Mechanically soft  Anticoagulation/Antiplatelet Rx Suggestions: Would resume antiplatelet agents.  Would start either aspirin or clopidogrel Monday with the other one several days later.  GI Medications:  Pantoprazole 40 mg twice daily.  Patient does not need to be on famotidine.  Labs/Procedures Ordered:  GI FOLLOW UP:  Call [phone] to make appointment.   Doctor: Have him follow-up with Dr. Oletta Lamas in about 2 months to arrange repeat EGD to document healing     Laurence Spates, MD   Pager [phone] If no answer or after hours call  [phone]

## 2017-10-17 ENCOUNTER — Encounter (HOSPITAL_COMMUNITY): Payer: Self-pay | Admitting: Emergency Medicine

## 2017-10-17 ENCOUNTER — Other Ambulatory Visit: Payer: Self-pay

## 2017-10-17 ENCOUNTER — Emergency Department (HOSPITAL_COMMUNITY)
Admission: EM | Admit: 2017-10-17 | Discharge: 2017-10-17 | Disposition: A | Discharge status: Home / Self Care | Payer: Medicare Other | Attending: Emergency Medicine | Admitting: Emergency Medicine

## 2017-10-17 ENCOUNTER — Telehealth: Payer: Self-pay | Admitting: *Deleted

## 2017-10-17 DIAGNOSIS — I10 Essential (primary) hypertension: Secondary | ICD-10-CM | POA: Insufficient documentation

## 2017-10-17 DIAGNOSIS — J449 Chronic obstructive pulmonary disease, unspecified: Secondary | ICD-10-CM | POA: Diagnosis not present

## 2017-10-17 DIAGNOSIS — Z87891 Personal history of nicotine dependence: Secondary | ICD-10-CM | POA: Insufficient documentation

## 2017-10-17 DIAGNOSIS — K922 Gastrointestinal hemorrhage, unspecified: Secondary | ICD-10-CM | POA: Diagnosis present

## 2017-10-17 DIAGNOSIS — Z7982 Long term (current) use of aspirin: Secondary | ICD-10-CM | POA: Diagnosis not present

## 2017-10-17 DIAGNOSIS — Z79899 Other long term (current) drug therapy: Secondary | ICD-10-CM | POA: Insufficient documentation

## 2017-10-17 DIAGNOSIS — K270 Acute peptic ulcer, site unspecified, with hemorrhage: Secondary | ICD-10-CM | POA: Insufficient documentation

## 2017-10-17 DIAGNOSIS — K274 Chronic or unspecified peptic ulcer, site unspecified, with hemorrhage: Secondary | ICD-10-CM

## 2017-10-17 LAB — TYPE AND SCREEN
ABO/RH(D): O NEG
Antibody Screen: NEGATIVE

## 2017-10-17 LAB — COMPREHENSIVE METABOLIC PANEL
ALT: 14 U/L (ref 0–44)
AST: 16 U/L (ref 15–41)
Albumin: 3.1 g/dL — ABNORMAL LOW (ref 3.5–5.0)
Alkaline Phosphatase: 79 U/L (ref 38–126)
Anion gap: 8 (ref 5–15)
BUN: 17 mg/dL (ref 8–23)
CHLORIDE: 106 mmol/L (ref 98–111)
CO2: 24 mmol/L (ref 22–32)
CREATININE: 1.03 mg/dL (ref 0.61–1.24)
Calcium: 8.6 mg/dL — ABNORMAL LOW (ref 8.9–10.3)
GFR calc non Af Amer: >60 mL/min (ref >60)
Glucose, Bld: 134 mg/dL — ABNORMAL HIGH (ref 70–99)
Potassium: 3.9 mmol/L (ref 3.5–5.1)
Sodium: 138 mmol/L (ref 135–145)
Total Bilirubin: 0.6 mg/dL (ref 0.3–1.2)
Total Protein: 6.5 g/dL (ref 6.5–8.1)

## 2017-10-17 LAB — CBC
HCT: 26.9 % — ABNORMAL LOW (ref 39.0–52.0)
Hemoglobin: 8.4 g/dL — ABNORMAL LOW (ref 13.0–17.0)
MCH: 28.6 pg (ref 26.0–34.0)
MCHC: 31.2 g/dL (ref 30.0–36.0)
MCV: 91.5 fL (ref 78.0–100.0)
PLATELETS: 335 10*3/uL (ref 150–400)
RBC: 2.94 MIL/uL — AB (ref 4.22–5.81)
RDW: 12.8 % (ref 11.5–15.5)
WBC: 5.8 10*3/uL (ref 4.0–10.5)

## 2017-10-17 LAB — POC OCCULT BLOOD, ED: FECAL OCCULT BLD: POSITIVE — AB

## 2017-10-17 MED ORDER — SODIUM CHLORIDE 0.9 % IV BOLUS
500.0000 mL | Freq: Once | INTRAVENOUS | Status: AC
  Administered 2017-10-17: 500 mL via INTRAVENOUS

## 2017-10-17 NOTE — Telephone Encounter (Signed)
Patient was in the ER twice last week for GI bleed and really weak. Patient almost fell twice this morning. Patient does not have follow up with GI until the 24th. What are your recommendations. He has a follow up with you on Friday.

## 2017-10-17 NOTE — Telephone Encounter (Signed)
If he is still having that then he should go back to the ER

## 2017-10-17 NOTE — ED Triage Notes (Addendum)
C/o black stools, told to return if no better-- has had them for "a long time-I think my ulcers is making that happen"  Admitted to hospital last week for same

## 2017-10-17 NOTE — ED Provider Notes (Signed)
[place] EMERGENCY DEPARTMENT Provider Note   CSN: [id number] Arrival date & time: 10/17/17  1228     History   Chief Complaint Chief Complaint  Patient presents with  . GI Bleeding    HPI Todd Berg is a 67 y.o. male.  HPI   He is here for concern of ongoing black-colored bowel movements, twice since discharge from the hospital, 4 days ago.  Each time the stool has been a very small amount despite taking some Metamucil.  He is here with family members were concerned that he might need a colonoscopy to evaluate him for rectal cancer or colon cancer.  He states he has been eating fairly well.  Yesterday he had some abdominal pain which is described as "bad."  He has been able to eat.  There is been no fever, chills, productive cough, focal weakness or paresthesia.  He is taking his usual medications.  He states he does not take Pepto-Bismol, or iron.  There are no other known modifying factors.  Past Medical History:  Diagnosis Date  . Cancer ([place]) 2015   rectal  . Chronic back pain   . COPD (chronic obstructive pulmonary disease) ([place])   . GERD (gastroesophageal reflux disease)   . Hematemesis   . Hemorrhoids   . Hyperlipidemia   . Hypotension   . Melena   . Melena 10/12/2017  . Stroke [facility])     Patient Active Problem List   Diagnosis Date Noted  . Melena 10/12/2017  . Hypokalemia 10/12/2017  . Sinus bradycardia 10/12/2017  . Anemia 10/10/2017  . Fatigue 10/10/2017  . Hypertension 03/21/2017  . Generalized anxiety disorder 03/21/2017  . Degenerative disc disease, lumbar 03/17/2017  . Iron deficiency anemia due to chronic blood loss 11/20/2016  . GI bleed 06/09/2016  . Chronic back pain   . COPD (chronic obstructive pulmonary disease) ([place])   . GERD (gastroesophageal reflux disease)   . Hemorrhoids   . History of rectal cancer 09/13/2015    Past Surgical History:  Procedure Laterality Date  . BACK SURGERY    . BIOPSY  10/13/2017   Procedure: BIOPSY;  Surgeon: Laurence Spates, MD;  Location[facility] ENDOSCOPY;  Service: Endoscopy;;  . ESOPHAGOGASTRODUODENOSCOPY N/A 06/09/2016   Procedure: ESOPHAGOGASTRODUODENOSCOPY (EGD);  Surgeon: Wilford Corner, MD;  Location: [place];  Service: Endoscopy;  Laterality: N/A;  . ESOPHAGOGASTRODUODENOSCOPY (EGD) WITH PROPOFOL Left 11/21/2016   Procedure: ESOPHAGOGASTRODUODENOSCOPY (EGD) WITH PROPOFOL;  Surgeon: Arta Silence, MD;  Location[facility] ENDOSCOPY;  Service: Endoscopy;  Laterality: Left;  . ESOPHAGOGASTRODUODENOSCOPY (EGD) WITH PROPOFOL N/A 10/13/2017   Procedure: ESOPHAGOGASTRODUODENOSCOPY (EGD) WITH PROPOFOL;  Surgeon: Laurence Spates, MD;  Location: [place];  Service: Endoscopy;  Laterality: N/A;  . HEMORRHOID SURGERY    . HERNIA REPAIR    . RECTAL SURGERY          Home Medications    Prior to Admission medications   Medication Sig Start Date End Date Taking? Authorizing Provider  amLODipine (NORVASC) 2.5 MG tablet TAKE 1 TABLET DAILY Patient taking differently: Take 2.5 mg by mouth daily.  08/24/17  Yes Dettinger, [name], MD  aspirin EC 325 MG tablet Take 1 tablet (325 mg total) by mouth daily. 10/17/17  Yes Ghimire, Henreitta Leber, MD  diclofenac sodium (VOLTAREN) 1 % GEL Apply 2 g topically 4 (four) times daily. 10/06/17 11/05/17 Yes Rakes, Connye Burkitt, FNP  fluticasone (FLONASE) 50 MCG/ACT nasal spray Place 1 spray into both nostrils 2 (two) times daily as needed  for allergies or rhinitis. 06/20/17  Yes Dettinger, [name], MD  pantoprazole (PROTONIX) 40 MG tablet Take 1 tablet (40 mg total) by mouth 2 (two) times daily. 10/14/17  Yes Ghimire, Henreitta Leber, MD  tetrahydrozoline 0.05 % ophthalmic solution Place 1 drop daily as needed into both eyes (itching).   Yes [provider]  tiZANidine (ZANAFLEX) 2 MG tablet Take 1 tablet (2 mg total) by mouth every 6 (six) hours as needed for muscle spasms. 10/06/17  Yes Rakes, Connye Burkitt, FNP    Family History Family History    Problem Relation Age of Onset  . COPD Mother   . Stroke Father     Social History Social History   Tobacco Use  . Smoking status: Former Smoker    Start date: 12/30/1964    Last attempt to quit: 06/19/2009    Years since quitting: 8.3  . Smokeless tobacco: Former Systems developer    Types: Snuff    Quit date: 09/26/2005  Substance Use Topics  . Alcohol use: No  . Drug use: No     Allergies   Patient has no known allergies.   Review of Systems Review of Systems  All other systems reviewed and are negative.    Physical Exam Updated Vital Signs BP (!) 168/76   Pulse 62   Temp 98.4 F (36.9 C) (Oral)   Resp 20   SpO2 100%   Physical Exam  Constitutional: He is oriented to person, place, and time. He appears well-developed and well-nourished. No distress.  HENT:  Head: Normocephalic and atraumatic.  Right Ear: External ear normal.  Left Ear: External ear normal.  Eyes: Pupils are equal, round, and reactive to light. Conjunctivae and EOM are normal.  Neck: Normal range of motion and phonation normal. Neck supple.  Cardiovascular: Normal rate, regular rhythm and normal heart sounds.  Pulmonary/Chest: Effort normal and breath sounds normal. He exhibits no bony tenderness.  Abdominal: Soft. There is no tenderness.  Genitourinary:  Genitourinary Comments: Normal anus-rectal vault with a small amount of black in stool present.  No mass, no red blood, no fecal impaction.  Musculoskeletal: Normal range of motion.  Neurological: He is alert and oriented to person, place, and time. No cranial nerve deficit or sensory deficit. He exhibits normal muscle tone. Coordination normal.  Skin: Skin is warm, dry and intact.  Psychiatric: He has a normal mood and affect. His behavior is normal. Judgment and thought content normal.  Nursing note and vitals reviewed.    ED Treatments / Results  Labs (all labs ordered are listed, but only abnormal results are displayed) Labs Reviewed   COMPREHENSIVE METABOLIC PANEL - Abnormal; Notable for the following components:      Result Value   Glucose, Bld 134 (*)    Calcium 8.6 (*)    Albumin 3.1 (*)    All other components within normal limits  CBC - Abnormal; Notable for the following components:   RBC 2.94 (*)    Hemoglobin 8.4 (*)    HCT 26.9 (*)    All other components within normal limits  POC OCCULT BLOOD, ED - Abnormal; Notable for the following components:   Fecal Occult Bld POSITIVE (*)    All other components within normal limits  URINALYSIS, ROUTINE W REFLEX MICROSCOPIC  TYPE AND SCREEN   Hemoglobin  Date Value Ref Range Status  10/17/2017 8.4 (L) 13.0 - 17.0 g/dL Final  10/14/2017 8.7 (L) 13.0 - 17.0 g/dL Final  10/13/2017 8.0 (L) 13.0 -  17.0 g/dL Final  10/13/2017 8.1 (L) 13.0 - 17.0 g/dL Final  03/17/2017 11.4 (L) 13.0 - 17.7 g/dL Final  11/26/2016 10.4 (L) 13.0 - 17.7 g/dL Final  06/17/2016 8.9 (<) 13.0 - 17.7 g/dL Final    Comment:                      Client Requested [place]    EKG None  Radiology No results found.  Procedures Procedures (including critical care time)  Medications Ordered in ED Medications  sodium chloride 0.9 % bolus 500 mL (500 mLs Intravenous New Bag/Given 10/17/17 1656)     Initial Impression / Assessment and Plan / ED Course  I have reviewed the triage vital signs and the nursing notes.  Pertinent labs & imaging results that were available during my care of the patient were reviewed by me and considered in my medical decision making (see chart for details).  Clinical Course as of Oct 17 1836  Molli Knock Oct 17, 2017  1526 Normal except hemoglobin low, 8.4  CBC(!) [EW]  1527 Normal except glucose high, albumin low  Comprehensive metabolic panel(!) [EW]  4656 abnormal  POC occult blood, ED(!) [EW]    Clinical Course User Index [EW] Daleen Bo, MD     Patient Vitals for the past 24 hrs:  BP Temp Temp src Pulse Resp SpO2  10/17/17 1645 - - - 62 - 100 %   10/17/17 1630 (!) 168/76 - - - - -  10/17/17 1435 (!) 143/77 98.4 F (36.9 C) Oral (!) 58 20 100 %  10/17/17 1232 (!) 150/76 97.9 F (36.6 C) - 70 15 100 %    6:38 PM Reevaluation with update and discussion. After initial assessment and treatment, an updated evaluation reveals currently ambulating, not week not dizzy.  Orthostatics negative.  Findings discussed and questions answered. Daleen Bo   Medical Decision Making: Dark stool, likely secondary to recent GI bleed.  Patient has had minimal stooling, and I suspect he is just not cleared out the bloody stool recent episode of upper GI bleeding.  BUN normal.  Vital signs are reassuring.  No indication for further evaluation treatment in the ED setting.  He has follow-up appointment scheduled with GI.  CRITICAL CARE-no Performed by: Daleen Bo   Nursing Notes Reviewed/ Care Coordinated Applicable Imaging Reviewed Interpretation of Laboratory Data incorporated into ED treatment  The patient appears reasonably screened and/or stabilized for discharge and I doubt any other medical condition or other [facility] requiring further screening, evaluation, or treatment in the ED at this time prior to discharge.  Plan: Home Medications-continue current medications, consider starting iron pill daily; Home Treatments-rest, fluids, stool softener for several days to improve stooling; return here if the recommended treatment, does not improve the symptoms; Recommended follow up-PCP as needed.  GI as scheduled.     Final Clinical Impressions(s) / ED Diagnoses   Final diagnoses:  Gastrointestinal hemorrhage associated with peptic ulcer    ED Discharge Orders    None       Daleen Bo, MD 10/17/17 1840

## 2017-10-17 NOTE — ED Notes (Signed)
Patient verbalizes understanding of discharge instructions. Opportunity for questioning and answers were provided. Ambulatory at discharge in NAD.  

## 2017-10-17 NOTE — Discharge Instructions (Addendum)
It appears that the dark stool you are having is leftover blood from bleeding ulcer, which was seen, several days ago at the endoscopy.  It is unlikely that it is continued to need, at this time.  Hemoglobin is low but it is stable, and should improve with a regular diet.  Can also consider taking an iron pill, 1/day to improve your ability to make red blood cells.  Sure that you are getting plenty of rest, eating 3 meals a day and drinking a lot of fluid, especially water.  Return here, if needed, for problems.  Use a stool softener, like MiraLAX, daily, until you have good normal bowel movements which are normal in color.

## 2017-10-17 NOTE — Telephone Encounter (Signed)
Patient wife aware

## 2017-10-17 NOTE — ED Notes (Signed)
Pt stable, ambulatory, states understanding of discharge instructions 

## 2017-10-21 ENCOUNTER — Encounter: Payer: Self-pay | Admitting: Family Medicine

## 2017-10-21 ENCOUNTER — Ambulatory Visit (INDEPENDENT_AMBULATORY_CARE_PROVIDER_SITE_OTHER): Payer: Medicare Other | Admitting: Family Medicine

## 2017-10-21 VITALS — BP 126/61 | HR 61 | Temp 96.8°F | Ht 72.0 in | Wt 199.2 lb

## 2017-10-21 DIAGNOSIS — K921 Melena: Secondary | ICD-10-CM

## 2017-10-21 DIAGNOSIS — K250 Acute gastric ulcer with hemorrhage: Secondary | ICD-10-CM

## 2017-10-21 NOTE — Patient Instructions (Signed)
Stop taking aspirin

## 2017-10-21 NOTE — Progress Notes (Signed)
BP 126/61   Pulse 61   Temp (!) 96.8 F (36 C) (Oral)   Ht 6' (1.829 m)   Wt 199 lb 3.2 oz (90.4 kg)   BMI 27.02 kg/m    Subjective:    Patient ID: Todd Berg, male    DOB: [birth year], 67 y.o.   MRN: [id number]  HPI: Todd Berg is a 67 y.o. male presenting on 10/21/2017 for Hospitalization Follow-up (GI bleed)   HPI Hospital follow-up for GI bleeding gastric ulcer Patient is coming in for hospital follow-up for GI bleed and gastric ulcer.  Patient went into the emergency department on 10/12/2017 and was discharged on 10/17/2017.  He went in with melena and abdominal pain and feelings of a recurrent GI bleed that he had previously.  His hemoglobin was 8.6 on admission and prior to that about a month ago it was 10.2.  With the drop in hemoglobin and his symptoms he was seen in emergency department where he was evaluated and had EGD and found that he did have a gastric ulcer which was biopsied.  I do not see the results for the biopsy at this time.  Since leaving the hospital he has been taking his pantoprazole he supposed to and has stopped taking his aspirin and has been feeling a lot better.  He denies any further melena or abdominal pain and says that everything is doing very well for him currently.  He says his energy is coming back and he no longer feels lightheaded or like his can follow.  Relevant past medical, surgical, family and social history reviewed and updated as indicated. Interim medical history since our last visit reviewed. Allergies and medications reviewed and updated.  Review of Systems  Constitutional: Negative for chills and fever.  Eyes: Negative for visual disturbance.  Respiratory: Negative for shortness of breath and wheezing.   Cardiovascular: Negative for chest pain and leg swelling.  Gastrointestinal: Negative for abdominal pain, anal bleeding, blood in stool, constipation and diarrhea.  Musculoskeletal: Negative for back pain and gait problem.    Skin: Negative for rash.  Neurological: Negative for dizziness, weakness, light-headedness and numbness.  All other systems reviewed and are negative.   Per HPI unless specifically indicated above   Allergies as of 10/21/2017   No Known Allergies     Medication List        Accurate as of 10/21/17 10:30 AM. Always use your most recent med list.          amLODipine 2.5 MG tablet Commonly known as:  NORVASC TAKE 1 TABLET DAILY   aspirin EC 325 MG tablet Take 1 tablet (325 mg total) by mouth daily.   diclofenac sodium 1 % Gel Commonly known as:  VOLTAREN Apply 2 g topically 4 (four) times daily.   fluticasone 50 MCG/ACT nasal spray Commonly known as:  FLONASE Place 1 spray into both nostrils 2 (two) times daily as needed for allergies or rhinitis.   pantoprazole 40 MG tablet Commonly known as:  PROTONIX Take 1 tablet (40 mg total) by mouth 2 (two) times daily.   tetrahydrozoline 0.05 % ophthalmic solution Place 1 drop daily as needed into both eyes (itching).   tiZANidine 2 MG tablet Commonly known as:  ZANAFLEX Take 1 tablet (2 mg total) by mouth every 6 (six) hours as needed for muscle spasms.          Objective:    BP 126/61   Pulse 61   Temp (!) 96.8  F (36 C) (Oral)   Ht 6' (1.829 m)   Wt 199 lb 3.2 oz (90.4 kg)   BMI 27.02 kg/m   Wt Readings from Last 3 Encounters:  10/21/17 199 lb 3.2 oz (90.4 kg)  10/12/17 196 lb 4.8 oz (89 kg)  10/10/17 199 lb (90.3 kg)    Physical Exam  Constitutional: He is oriented to person, place, and time. He appears well-developed and well-nourished. No distress.  Eyes: Conjunctivae are normal. No scleral icterus.  Cardiovascular: Normal rate, regular rhythm, normal heart sounds and intact distal pulses.  No murmur heard. Pulmonary/Chest: Effort normal and breath sounds normal. No respiratory distress. He has no wheezes.  Abdominal: Soft. Bowel sounds are normal. He exhibits no distension and no mass. There is no  tenderness. There is no guarding.  Neurological: He is alert and oriented to person, place, and time. Coordination normal.  Skin: Skin is warm and dry. No rash noted. He is not diaphoretic.  Psychiatric: He has a normal mood and affect. His behavior is normal.  Nursing note and vitals reviewed.       Assessment & Plan:   Problem List Items Addressed This Visit      Digestive   Melena   Relevant Orders   CBC with Differential/Platelet   CMP14+EGFR    Other Visit Diagnoses    Acute gastric ulcer with hemorrhage    -  Primary   Relevant Orders   CBC with Differential/Platelet   CMP14+EGFR      We will check CBC and hemoglobin have him follow-up with gastroenterology.   Follow up plan: Return if symptoms worsen or fail to improve.  Counseling provided for all of the vaccine components Orders Placed This Encounter  Procedures  . CBC with Differential/Platelet  . [place] Dettinger, MD [facility] 10/21/2017, 10:30 AM

## 2017-10-22 LAB — CMP14+EGFR
ALBUMIN: 3.7 g/dL (ref 3.6–4.8)
ALT: 9 IU/L (ref 0–44)
AST: 11 IU/L (ref 0–40)
Albumin/Globulin Ratio: 1.4 (ref 1.2–2.2)
Alkaline Phosphatase: 95 IU/L (ref 39–117)
BUN / CREAT RATIO: 14 (ref 10–24)
BUN: 14 mg/dL (ref 8–27)
Bilirubin Total: 0.3 mg/dL (ref 0.0–1.2)
CO2: 24 mmol/L (ref 20–29)
CREATININE: 1.01 mg/dL (ref 0.76–1.27)
Calcium: 8.9 mg/dL (ref 8.6–10.2)
Chloride: 102 mmol/L (ref 96–106)
GFR calc non Af Amer: 76 mL/min/{1.73_m2} (ref >59)
GFR, EST AFRICAN AMERICAN: 88 mL/min/{1.73_m2} (ref >59)
Globulin, Total: 2.7 g/dL (ref 1.5–4.5)
Glucose: 105 mg/dL — ABNORMAL HIGH (ref 65–99)
Potassium: 3.6 mmol/L (ref 3.5–5.2)
Sodium: 141 mmol/L (ref 134–144)
TOTAL PROTEIN: 6.4 g/dL (ref 6.0–8.5)

## 2017-10-22 LAB — CBC WITH DIFFERENTIAL/PLATELET
Basophils Absolute: 0.1 10*3/uL (ref 0.0–0.2)
Basos: 1 %
EOS (ABSOLUTE): 0.2 10*3/uL (ref 0.0–0.4)
Eos: 2 %
HEMOGLOBIN: 8.3 g/dL — AB (ref 13.0–17.7)
Hematocrit: 25.7 % — ABNORMAL LOW (ref 37.5–51.0)
IMMATURE GRANS (ABS): 0 10*3/uL (ref 0.0–0.1)
Immature Granulocytes: 0 %
LYMPHS: 28 %
Lymphocytes Absolute: 2 10*3/uL (ref 0.7–3.1)
MCH: 27.4 pg (ref 26.6–33.0)
MCHC: 32.3 g/dL (ref 31.5–35.7)
MCV: 85 fL (ref 79–97)
MONOCYTES: 8 %
Monocytes Absolute: 0.6 10*3/uL (ref 0.1–0.9)
Neutrophils Absolute: 4.4 10*3/uL (ref 1.4–7.0)
Neutrophils: 61 %
Platelets: 406 10*3/uL (ref 150–450)
RBC: 3.03 x10E6/uL — AB (ref 4.14–5.80)
RDW: 13.5 % (ref 12.3–15.4)
WBC: 7.3 10*3/uL (ref 3.4–10.8)

## 2017-10-24 ENCOUNTER — Other Ambulatory Visit: Payer: Self-pay | Admitting: *Deleted

## 2017-10-24 ENCOUNTER — Ambulatory Visit: Payer: Medicare Other | Admitting: Adult Health

## 2017-10-24 ENCOUNTER — Encounter: Payer: Self-pay | Admitting: Adult Health

## 2017-10-24 DIAGNOSIS — D649 Anemia, unspecified: Secondary | ICD-10-CM

## 2017-10-24 NOTE — Progress Notes (Deleted)
[facility] [address] [place]. [place] [address] [phone]       OFFICE FOLLOW UP NOTE  [name] Date of Birth:  [birth year] Medical Record Number:  [id number]   Reason for visit: Stroke follow up  CHIEF COMPLAINT:  No chief complaint on file.   HPI: Todd Berg is being seen today in the office for left lateral thalamic lacunar infarct on 05/25/2017. History obtained from patient and chart review. Reviewed all radiology images and labs personally.  [name] is a 67 y.o. male with history of rectal cancer. Chronic back pain and HTN presenting with slurred speech and confusion.  CT head reviewed and showed no acute stroke.  MRI head reviewed and showed tiny left lateral thalamic lacunar infarct.  MRA head and neck showed occluded distal right VA.  2D echo showed an EF of 60 to 65% without cardiac source of embolus.  LDL 121 as patient was on statin therapy PTA was recommended to start Lipitor 80 mg.  A1c satisfactory at 5.6.  Patient was not on antithrombotic prior to admission and recommended DAPT with aspirin and Plavix for 3 weeks and then aspirin alone.  Therapy recommended outpatient speech and was discharged home in stable condition.  06/22/2017 visit: Patient is being seen today for hospital follow-up.  He states all symptoms have resolved and he is back to doing all normal activities without difficulty.  He did undergo speech therapy once discharged but has since completed this.  He continues to take aspirin only without side effects of bleeding or bruising as he is completed Plavix therapy.  He continues Lipitor without side effects of myalgias.  Blood pressure today 146/76.  Denies new or worsening stroke/TIA symptoms.  Interval history 10/24/2017: Patient returns today for scheduled follow-up visit.  Since prior visit, patient was seen in the ED on 10/10/2017 for melena and despite Hemoccult-positive, his Hgb was stable and was  discharged home in satisfactory condition with recommendations of outpatient follow-up.  He return to ED on 10-19 for worsening melena and noted Hgb decreased to 8.9 and was admitted for anemia secondary to persistent GI bleeding.  He did undergo EGD which showed small gastric ulcer that was nonbleeding with Hgb remaining stable without need a transfusion.  GI recommended continue PPI and resume aspirin on 10/17/2017 along with continued outpatient follow-up.  He returned on 10/17/2017 with concerns of ongoing black-colored bowel movements and was felt as though this was residual from recent GI bleed that has not fully cleared.   ROS:   14 system review of systems performed and negative with exception of no complaints  PMH:  Past Medical History:  Diagnosis Date  . Cancer ([place]) 2015   rectal  . Chronic back pain   . COPD (chronic obstructive pulmonary disease) ([place])   . GERD (gastroesophageal reflux disease)   . Hematemesis   . Hemorrhoids   . Hyperlipidemia   . Hypotension   . Melena   . Melena 10/12/2017  . Stroke [facility])     PSH:  Past Surgical History:  Procedure Laterality Date  . BACK SURGERY    . BIOPSY  10/13/2017   Procedure: BIOPSY;  Surgeon: Laurence Spates, MD;  Location: [facility] ENDOSCOPY;  Service: Endoscopy;;  . ESOPHAGOGASTRODUODENOSCOPY N/A 06/09/2016   Procedure: ESOPHAGOGASTRODUODENOSCOPY (EGD);  Surgeon: Wilford Corner, MD;  Location: [place];  Service: Endoscopy;  Laterality: N/A;  . ESOPHAGOGASTRODUODENOSCOPY (EGD) WITH PROPOFOL Left 11/21/2016   Procedure: ESOPHAGOGASTRODUODENOSCOPY (EGD) WITH  PROPOFOL;  Surgeon: Arta Silence, MD;  Location: [facility] ENDOSCOPY;  Service: Endoscopy;  Laterality: Left;  . ESOPHAGOGASTRODUODENOSCOPY (EGD) WITH PROPOFOL N/A 10/13/2017   Procedure: ESOPHAGOGASTRODUODENOSCOPY (EGD) WITH PROPOFOL;  Surgeon: Laurence Spates, MD;  Location: [place];  Service: Endoscopy;  Laterality: N/A;  . HEMORRHOID SURGERY    . HERNIA REPAIR    .  RECTAL SURGERY      Social History:  Social History   Socioeconomic History  . Marital status: Married    Spouse name: Not on file  . Number of children: 5  . Years of education: Not on file  . Highest education level: Not on file  Occupational History  . Not on file  Social Needs  . Financial resource strain: Not on file  . Food insecurity:    Worry: Never true    Inability: Never true  . Transportation needs:    Medical: No    Non-medical: No  Tobacco Use  . Smoking status: Former Smoker    Start date: 12/30/1964    Last attempt to quit: 06/19/2009    Years since quitting: 8.3  . Smokeless tobacco: Former Systems developer    Types: Snuff    Quit date: 09/26/2005  Substance and Sexual Activity  . Alcohol use: No  . Drug use: No  . Sexual activity: Not on file  Lifestyle  . Physical activity:    Days per week: 0 days    Minutes per session: 0 min  . Stress: Not at all  Relationships  . Social connections:    Talks on phone: Once a week    Gets together: Never    Attends religious service: Never    Active member of club or organization: No    Attends meetings of clubs or organizations: Never    Relationship status: Married  . Intimate partner violence:    Fear of current or ex partner: Not on file    Emotionally abused: Not on file    Physically abused: Not on file    Forced sexual activity: Not on file  Other Topics Concern  . Not on file  Social History Narrative   Married, 5 children, several grandchildren    Family History:  Family History  Problem Relation Age of Onset  . COPD Mother   . Stroke Father     Medications:   Current Outpatient Medications on File Prior to Visit  Medication Sig Dispense Refill  . amLODipine (NORVASC) 2.5 MG tablet TAKE 1 TABLET DAILY (Patient taking differently: Take 2.5 mg by mouth daily. ) 30 tablet 5  . aspirin EC 325 MG tablet Take 1 tablet (325 mg total) by mouth daily. 90 tablet 3  . diclofenac sodium (VOLTAREN) 1 % GEL  Apply 2 g topically 4 (four) times daily. 1 Tube 1  . fluticasone (FLONASE) 50 MCG/ACT nasal spray Place 1 spray into both nostrils 2 (two) times daily as needed for allergies or rhinitis. 16 g 11  . pantoprazole (PROTONIX) 40 MG tablet Take 1 tablet (40 mg total) by mouth 2 (two) times daily. 60 tablet 0  . tetrahydrozoline 0.05 % ophthalmic solution Place 1 drop daily as needed into both eyes (itching).    Marland Kitchen tiZANidine (ZANAFLEX) 2 MG tablet Take 1 tablet (2 mg total) by mouth every 6 (six) hours as needed for muscle spasms. 30 tablet 0   No current facility-administered medications on file prior to visit.     Allergies:  No Known Allergies   Physical Exam  There were no vitals filed for this visit. There is no height or weight on file to calculate BMI. No exam data present  General: well developed, pleasant elderly Caucasian male, well nourished, seated, in no evident distress Head: head normocephalic and atraumatic.   Neck: supple with no carotid or supraclavicular bruits Cardiovascular: regular rate and rhythm, no murmurs Musculoskeletal: no deformity Skin:  no rash/petichiae Vascular:  Normal pulses all extremities  Neurologic Exam Mental Status: Awake and fully alert. Oriented to place and time. Recent and remote memory intact. Attention span, concentration and fund of knowledge appropriate. Mood and affect appropriate.  Cranial Nerves: Fundoscopic exam reveals sharp disc margins. Pupils equal, briskly reactive to light. Extraocular movements full without nystagmus. Visual fields full to confrontation. Hearing intact. Facial sensation intact. Face, tongue, palate moves normally and symmetrically.  Motor: Normal bulk and tone. Normal strength in all tested extremity muscles. Sensory.: intact to touch , pinprick , position and vibratory sensation.  Coordination: Rapid alternating movements normal in all extremities. Finger-to-nose and heel-to-shin performed accurately  bilaterally. Gait and Station: Arises from chair without difficulty. Stance is normal. Gait demonstrates normal stride length and balance . Able to heel, toe and tandem walk without difficulty.  Reflexes: 1+ and symmetric. Toes downgoing.    NIHSS  0 Modified Rankin  0   Diagnostic Data (Labs, Imaging, Testing)  CT HEAD WO CONTRAST 05/25/2017 IMPRESSION: Negative head CT.  ASPECTS is 10.  MR BRAIN WO CONTRAST MR MRA HEAD WO CONTRAST MR MRA NECK W WO CONTRAST 05/25/17 IMPRESSION: 8 mm acute infarct left lateral thalamus. Mild chronic microvascular ischemia Occluded distal right vertebral artery. Mild intracranial atherosclerotic disease.  ECHOCARDIOGRAM 05/25/17 Study Conclusions - Left ventricle: The cavity size was normal. There was mild   concentric hypertrophy. Systolic function was normal. The   estimated ejection fraction was in the range of 60% to 65%. Wall   motion was normal; there were no regional wall motion   abnormalities. There was an increased relative contribution of   atrial contraction to ventricular filling. Doppler parameters are   consistent with abnormal left ventricular relaxation (grade 1   diastolic dysfunction). Doppler parameters are consistent with   high ventricular filling pressure. - Mitral valve: There was mild regurgitation. - Left atrium: The atrium was mildly dilated. - Pulmonary arteries: Systolic pressure could not be accurately   estimated. - Pericardium, extracardiac: A small, free-flowing pericardial   effusion was identified posterior to the heart. The fluid had no   internal echoes.    ASSESSMENT: Todd Berg is a 67 y.o. year old male here with left lateral thalamic lacune infarct on 05/25/17 secondary to small vessel disease. Vascular risk factors include HTN and HLD.  Patient returns today for stroke follow-up and has been stable from stroke standpoint without residual deficits or recurring of symptoms.  Recent admission on  10/12/2017 for GI bleed.    PLAN: -Currently aspirin 81 mg but increase this to aspirin 325 due to recent stroke and multiple risk factors and continue Lipitor for secondary stroke prevention -F/u with PCP regarding your HLD and HTN management -continue to monitor BP at home -Maintain strict control of hypertension with blood pressure goal below 130/90, diabetes with hemoglobin A1c goal below 6.5% and cholesterol with LDL cholesterol (bad cholesterol) goal below 70 mg/dL. I also advised the patient to eat a healthy diet with plenty of whole grains, cereals, fruits and vegetables, exercise regularly and maintain ideal body weight.  Follow up in 4  months or call earlier if needed   Greater than 50% of time during this 25 minute visit was spent on counseling,explanation of diagnosis of left lateral thalamic lacunar infarct, reviewing risk factor management of HLD and HTN, planning of further management, discussion with patient and family and coordination of care    Venancio Poisson, [facility]  [facility] [address] [place], [place] [address]  Phone [phone] Fax [phone]

## 2017-10-25 ENCOUNTER — Telehealth: Payer: Self-pay | Admitting: *Deleted

## 2017-10-25 ENCOUNTER — Other Ambulatory Visit: Payer: Self-pay

## 2017-10-25 ENCOUNTER — Encounter (HOSPITAL_COMMUNITY): Payer: Self-pay | Admitting: Emergency Medicine

## 2017-10-25 ENCOUNTER — Telehealth: Payer: Self-pay

## 2017-10-25 ENCOUNTER — Inpatient Hospital Stay (HOSPITAL_COMMUNITY)
Admission: EM | Admit: 2017-10-25 | Discharge: 2017-10-27 | DRG: 811 | Disposition: A | Discharge status: Home / Self Care | Payer: Medicare Other | Attending: Internal Medicine | Admitting: Internal Medicine

## 2017-10-25 DIAGNOSIS — I10 Essential (primary) hypertension: Secondary | ICD-10-CM | POA: Diagnosis present

## 2017-10-25 DIAGNOSIS — K921 Melena: Secondary | ICD-10-CM | POA: Diagnosis present

## 2017-10-25 DIAGNOSIS — Z85048 Personal history of other malignant neoplasm of rectum, rectosigmoid junction, and anus: Secondary | ICD-10-CM

## 2017-10-25 DIAGNOSIS — W06XXXA Fall from bed, initial encounter: Secondary | ICD-10-CM | POA: Diagnosis present

## 2017-10-25 DIAGNOSIS — Y92003 Bedroom of unspecified non-institutional (private) residence as the place of occurrence of the external cause: Secondary | ICD-10-CM | POA: Diagnosis not present

## 2017-10-25 DIAGNOSIS — R55 Syncope and collapse: Secondary | ICD-10-CM | POA: Diagnosis present

## 2017-10-25 DIAGNOSIS — R402253 Coma scale, best verbal response, oriented, at hospital admission: Secondary | ICD-10-CM | POA: Diagnosis present

## 2017-10-25 DIAGNOSIS — K219 Gastro-esophageal reflux disease without esophagitis: Secondary | ICD-10-CM | POA: Diagnosis present

## 2017-10-25 DIAGNOSIS — K449 Diaphragmatic hernia without obstruction or gangrene: Secondary | ICD-10-CM | POA: Diagnosis present

## 2017-10-25 DIAGNOSIS — J449 Chronic obstructive pulmonary disease, unspecified: Secondary | ICD-10-CM | POA: Diagnosis present

## 2017-10-25 DIAGNOSIS — Z8673 Personal history of transient ischemic attack (TIA), and cerebral infarction without residual deficits: Secondary | ICD-10-CM | POA: Diagnosis not present

## 2017-10-25 DIAGNOSIS — Z79899 Other long term (current) drug therapy: Secondary | ICD-10-CM

## 2017-10-25 DIAGNOSIS — G8929 Other chronic pain: Secondary | ICD-10-CM | POA: Diagnosis present

## 2017-10-25 DIAGNOSIS — D62 Acute posthemorrhagic anemia: Secondary | ICD-10-CM | POA: Diagnosis present

## 2017-10-25 DIAGNOSIS — K922 Gastrointestinal hemorrhage, unspecified: Secondary | ICD-10-CM | POA: Diagnosis present

## 2017-10-25 DIAGNOSIS — R001 Bradycardia, unspecified: Secondary | ICD-10-CM | POA: Diagnosis present

## 2017-10-25 DIAGNOSIS — R402143 Coma scale, eyes open, spontaneous, at hospital admission: Secondary | ICD-10-CM | POA: Diagnosis present

## 2017-10-25 DIAGNOSIS — M549 Dorsalgia, unspecified: Secondary | ICD-10-CM | POA: Diagnosis present

## 2017-10-25 DIAGNOSIS — K254 Chronic or unspecified gastric ulcer with hemorrhage: Secondary | ICD-10-CM | POA: Diagnosis present

## 2017-10-25 DIAGNOSIS — M79609 Pain in unspecified limb: Secondary | ICD-10-CM | POA: Diagnosis not present

## 2017-10-25 DIAGNOSIS — R402363 Coma scale, best motor response, obeys commands, at hospital admission: Secondary | ICD-10-CM | POA: Diagnosis present

## 2017-10-25 DIAGNOSIS — Z87891 Personal history of nicotine dependence: Secondary | ICD-10-CM | POA: Diagnosis not present

## 2017-10-25 DIAGNOSIS — Z7982 Long term (current) use of aspirin: Secondary | ICD-10-CM | POA: Diagnosis not present

## 2017-10-25 DIAGNOSIS — R531 Weakness: Secondary | ICD-10-CM | POA: Diagnosis present

## 2017-10-25 LAB — COMPREHENSIVE METABOLIC PANEL
ALBUMIN: 3 g/dL — AB (ref 3.5–5.0)
ALK PHOS: 78 U/L (ref 38–126)
ALT: 11 U/L (ref 0–44)
AST: 12 U/L — ABNORMAL LOW (ref 15–41)
Anion gap: 7 (ref 5–15)
BUN: 15 mg/dL (ref 8–23)
CO2: 25 mmol/L (ref 22–32)
CREATININE: 1.06 mg/dL (ref 0.61–1.24)
Calcium: 8.7 mg/dL — ABNORMAL LOW (ref 8.9–10.3)
Chloride: 106 mmol/L (ref 98–111)
GFR calc Af Amer: >60 mL/min (ref >60)
GFR calc non Af Amer: >60 mL/min (ref >60)
GLUCOSE: 87 mg/dL (ref 70–99)
Potassium: 3.4 mmol/L — ABNORMAL LOW (ref 3.5–5.1)
SODIUM: 138 mmol/L (ref 135–145)
Total Bilirubin: 0.4 mg/dL (ref 0.3–1.2)
Total Protein: 6.5 g/dL (ref 6.5–8.1)

## 2017-10-25 LAB — URINALYSIS, ROUTINE W REFLEX MICROSCOPIC
BILIRUBIN URINE: NEGATIVE
GLUCOSE, UA: NEGATIVE mg/dL
Hgb urine dipstick: NEGATIVE
KETONES UR: NEGATIVE mg/dL
Leukocytes, UA: NEGATIVE
NITRITE: NEGATIVE
PH: 6 (ref 5.0–8.0)
Protein, ur: NEGATIVE mg/dL
SPECIFIC GRAVITY, URINE: 1.021 (ref 1.005–1.030)

## 2017-10-25 LAB — CBC
HEMATOCRIT: 26.3 % — AB (ref 39.0–52.0)
Hemoglobin: 8.1 g/dL — ABNORMAL LOW (ref 13.0–17.0)
MCH: 27 pg (ref 26.0–34.0)
MCHC: 30.8 g/dL (ref 30.0–36.0)
MCV: 87.7 fL (ref 80.0–100.0)
NRBC: 0 % (ref 0.0–0.2)
PLATELETS: 380 10*3/uL (ref 150–400)
RBC: 3 MIL/uL — ABNORMAL LOW (ref 4.22–5.81)
RDW: 13.3 % (ref 11.5–15.5)
WBC: 6.2 10*3/uL (ref 4.0–10.5)

## 2017-10-25 LAB — CBC WITH DIFFERENTIAL/PLATELET
ABS IMMATURE GRANULOCYTES: 0.02 10*3/uL (ref 0.00–0.07)
BASOS ABS: 0.1 10*3/uL (ref 0.0–0.1)
Basophils Relative: 1 %
Eosinophils Absolute: 0.1 10*3/uL (ref 0.0–0.5)
Eosinophils Relative: 2 %
HEMATOCRIT: 26.4 % — AB (ref 39.0–52.0)
Hemoglobin: 7.8 g/dL — ABNORMAL LOW (ref 13.0–17.0)
Immature Granulocytes: 0 %
LYMPHS ABS: 1.8 10*3/uL (ref 0.7–4.0)
LYMPHS PCT: 31 %
MCH: 26.4 pg (ref 26.0–34.0)
MCHC: 29.5 g/dL — ABNORMAL LOW (ref 30.0–36.0)
MCV: 89.2 fL (ref 80.0–100.0)
MONO ABS: 0.6 10*3/uL (ref 0.1–1.0)
Monocytes Relative: 10 %
NEUTROS ABS: 3.3 10*3/uL (ref 1.7–7.7)
Neutrophils Relative %: 56 %
Platelets: 404 10*3/uL — ABNORMAL HIGH (ref 150–400)
RBC: 2.96 MIL/uL — ABNORMAL LOW (ref 4.22–5.81)
RDW: 13.2 % (ref 11.5–15.5)
WBC: 5.9 10*3/uL (ref 4.0–10.5)
nRBC: 0 % (ref 0.0–0.2)

## 2017-10-25 LAB — SAMPLE TO BLOOD BANK

## 2017-10-25 LAB — PREPARE RBC (CROSSMATCH)

## 2017-10-25 LAB — HEMOGLOBIN: HEMOGLOBIN: 8.5 g/dL — AB (ref 13.0–17.0)

## 2017-10-25 LAB — POC OCCULT BLOOD, ED: Fecal Occult Bld: POSITIVE — AB

## 2017-10-25 MED ORDER — FLUTICASONE PROPIONATE 50 MCG/ACT NA SUSP
1.0000 | Freq: Two times a day (BID) | NASAL | Status: DC | PRN
  Filled 2017-10-25: qty 16

## 2017-10-25 MED ORDER — TIZANIDINE HCL 2 MG PO TABS: 2.0000 mg | ORAL_TABLET | Freq: Four times a day (QID) | ORAL | Status: DC | PRN

## 2017-10-25 MED ORDER — ONDANSETRON HCL 4 MG PO TABS: 4.0000 mg | ORAL_TABLET | Freq: Four times a day (QID) | ORAL | Status: DC | PRN

## 2017-10-25 MED ORDER — SODIUM CHLORIDE 0.9 % IV SOLN
INTRAVENOUS | Status: DC
  Administered 2017-10-25: 21:00:00 via INTRAVENOUS

## 2017-10-25 MED ORDER — ZOLPIDEM TARTRATE 5 MG PO TABS: 5.0000 mg | ORAL_TABLET | Freq: Every evening | ORAL | Status: DC | PRN

## 2017-10-25 MED ORDER — PANTOPRAZOLE SODIUM 40 MG IV SOLR
40.0000 mg | Freq: Once | INTRAVENOUS | Status: AC
  Administered 2017-10-25: 40 mg via INTRAVENOUS
  Filled 2017-10-25: qty 40

## 2017-10-25 MED ORDER — PANTOPRAZOLE SODIUM 40 MG IV SOLR: 40.0000 mg | Freq: Two times a day (BID) | INTRAVENOUS | Status: DC

## 2017-10-25 MED ORDER — ONDANSETRON HCL 4 MG/2ML IJ SOLN: 4.0000 mg | Freq: Four times a day (QID) | INTRAMUSCULAR | Status: DC | PRN

## 2017-10-25 MED ORDER — SODIUM CHLORIDE 0.9% IV SOLUTION
Freq: Once | INTRAVENOUS | Status: AC
  Administered 2017-10-25: 21:00:00 via INTRAVENOUS

## 2017-10-25 NOTE — Telephone Encounter (Signed)
Pt no show for appt on 10/24/2017.

## 2017-10-25 NOTE — H&P (Signed)
[facility]                                                                                    Patient Demographics  Todd Berg, is a 67 y.o. male  CSN: [id number]  MRN: [id number]  DOB - 04-30-1950  Admit Date - 10/25/2017  Outpatient Primary MD for the patient is Dettinger, [name], MD   With History of -  Past Medical History:  Diagnosis Date  . Cancer ([place]) 2015   rectal  . Chronic back pain   . COPD (chronic obstructive pulmonary disease) ([place])   . GERD (gastroesophageal reflux disease)   . Hematemesis   . Hemorrhoids   . Hyperlipidemia   . Hypotension   . Melena   . Melena 10/12/2017  . Stroke [facility])       Past Surgical History:  Procedure Laterality Date  . BACK SURGERY    . BIOPSY  10/13/2017   Procedure: BIOPSY;  Surgeon: Laurence Spates, MD;  Location: [facility] ENDOSCOPY;  Service: Endoscopy;;  . ESOPHAGOGASTRODUODENOSCOPY N/A 06/09/2016   Procedure: ESOPHAGOGASTRODUODENOSCOPY (EGD);  Surgeon: Wilford Corner, MD;  Location: [place];  Service: Endoscopy;  Laterality: N/A;  . ESOPHAGOGASTRODUODENOSCOPY (EGD) WITH PROPOFOL Left 11/21/2016   Procedure: ESOPHAGOGASTRODUODENOSCOPY (EGD) WITH PROPOFOL;  Surgeon: Arta Silence, MD;  Location[facility] ENDOSCOPY;  Service: Endoscopy;  Laterality: Left;  . ESOPHAGOGASTRODUODENOSCOPY (EGD) WITH PROPOFOL N/A 10/13/2017   Procedure: ESOPHAGOGASTRODUODENOSCOPY (EGD) WITH PROPOFOL;  Surgeon: Laurence Spates, MD;  Location: [place];  Service: Endoscopy;  Laterality: N/A;  . HEMORRHOID SURGERY    . HERNIA REPAIR    . RECTAL SURGERY      in for   Chief Complaint  Patient presents with  . Fall     HPI  Todd Berg  is a 67 y.o. male, with past medical history significant for bleeding ulcer presenting today for a syncopal episode with recurrent melena.  The patient has been using Voltaren gel lately.  Patient denies any abdominal pain nausea or vomiting .  Patient syncopal episode occurred today  while he was getting out of bed and lasted for few seconds.  No chest pains or palpitations.  Patient has been on Protonix p.o. at home.  Patient feels weak and dizzy.  His hemoglobin in the emergency room was 7.8 and he is of old DVT was positive.  Patient had an EGD on the third which showed a nonbleeding ulcer.    Review of Systems    In addition to the HPI above,  No Fever-chills, No Headache, No changes with Vision or hearing, No problems swallowing food or Liquids, No Chest pain, Cough or Shortness of Breath, No Abdominal pain, No Nausea or Vommitting, Bowel movements are regular, No dysuria, No new skin rashes or bruises, No new joints pains-aches,  No new weakness, tingling, numbness in any extremity, No recent weight gain or loss, No polyuria, polydypsia or polyphagia, No significant Mental Stressors.  A full 10 point Review of Systems was done, except as stated above, all other Review of Systems were negative.   Social History Social History   Tobacco Use  . Smoking status: Former Smoker  Start date: 12/30/1964    Last attempt to quit: 06/19/2009    Years since quitting: 8.3  . Smokeless tobacco: Former Systems developer    Types: Snuff    Quit date: 09/26/2005  Substance Use Topics  . Alcohol use: No     Family History Family History  Problem Relation Age of Onset  . COPD Mother   . Stroke Father      Prior to Admission medications   Medication Sig Start Date End Date Taking? Authorizing Provider  amLODipine (NORVASC) 2.5 MG tablet TAKE 1 TABLET DAILY Patient taking differently: Take 2.5 mg by mouth daily.  08/24/17   Dettinger, [name], MD  aspirin EC 325 MG tablet Take 1 tablet (325 mg total) by mouth daily. 10/17/17   Ghimire, Henreitta Leber, MD  diclofenac sodium (VOLTAREN) 1 % GEL Apply 2 g topically 4 (four) times daily. 10/06/17 11/05/17  Baruch Gouty, FNP  fluticasone (FLONASE) 50 MCG/ACT nasal spray Place 1 spray into both nostrils 2 (two) times daily as needed  for allergies or rhinitis. 06/20/17   Dettinger, [name], MD  pantoprazole (PROTONIX) 40 MG tablet Take 1 tablet (40 mg total) by mouth 2 (two) times daily. 10/14/17   Ghimire, Henreitta Leber, MD  tetrahydrozoline 0.05 % ophthalmic solution Place 1 drop daily as needed into both eyes (itching).    [provider]  tiZANidine (ZANAFLEX) 2 MG tablet Take 1 tablet (2 mg total) by mouth every 6 (six) hours as needed for muscle spasms. 10/06/17   Baruch Gouty, FNP    No Known Allergies  Physical Exam  Vitals  Blood pressure (!) 159/75, pulse 60, temperature 98 F (36.7 C), temperature source Oral, resp. rate 14, height 6' (1.829 m), weight 90.4 kg, SpO2 100 %.   1. General chronically ill gentleman looks tired  2. Normal affect and insight, Not Suicidal or Homicidal, Awake Alert, Oriented X 3.  3. No F.N deficits, ALL C.Nerves Intact, Strength 5/5 all 4 extremities, Sensation intact all 4 extremities, Plantars down going.  4. Ears and Eyes appear Normal, Conjunctivae clear, PERRLA. Moist Oral Mucosa.  5. Supple Neck, No JVD, No cervical lymphadenopathy appriciated, No Carotid Bruits.  6. Symmetrical Chest wall movement, Good air movement bilaterally, CTAB.  7. RRR, No Gallops, Rubs or Murmurs, No Parasternal Heave.  8. Positive Bowel Sounds, Abdomen Soft, Non tender, No organomegaly appriciated,No rebound -guarding or rigidity.  9.  No Cyanosis, Normal Skin Turgor, No Skin Rash or Bruise.  10. Good muscle tone,  joints appear normal , no effusions, Normal ROM.    Data Review  CBC Recent Labs  Lab 10/21/17 1106 10/25/17 1533  WBC 7.3 5.9  HGB 8.3* 7.8*  HCT 25.7* 26.4*  PLT 406 404*  MCV 85 89.2  MCH 27.4 26.4  MCHC 32.3 29.5*  RDW 13.5 13.2  LYMPHSABS 2.0 1.8  MONOABS  --  0.6  EOSABS 0.2 0.1  BASOSABS 0.1 0.1   ------------------------------------------------------------------------------------------------------------------  Chemistries  Recent Labs  Lab  10/21/17 1106 10/25/17 1533  NA 141 138  K 3.6 3.4*  CL 102 106  CO2 24 25  GLUCOSE 105* 87  BUN 14 15  CREATININE 1.01 1.06  CALCIUM 8.9 8.7*  AST 11 12*  ALT 9 11  ALKPHOS 95 78  BILITOT 0.3 0.4   ------------------------------------------------------------------------------------------------------------------ estimated creatinine clearance is 73.2 mL/min (by C-G formula based on SCr of 1.06 mg/dL). ------------------------------------------------------------------------------------------------------------------ No results for input(s): TSH, T4TOTAL, T3FREE, THYROIDAB in the last 72  hours.  Invalid input(s): FREET3   Coagulation profile No results for input(s): INR, PROTIME in the last 168 hours. ------------------------------------------------------------------------------------------------------------------- No results for input(s): DDIMER in the last 72 hours. -------------------------------------------------------------------------------------------------------------------  Cardiac Enzymes No results for input(s): CKMB, TROPONINI, MYOGLOBIN in the last 168 hours.  Invalid input(s): CK ------------------------------------------------------------------------------------------------------------------ Invalid input(s): POCBNP   ---------------------------------------------------------------------------------------------------------------  Urinalysis    Component Value Date/Time   COLORURINE STRAW (A) 10/12/2017 1748   APPEARANCEUR CLEAR 10/12/2017 1748   APPEARANCEUR Clear 07/16/2017 0828   LABSPEC 1.004 (L) 10/12/2017 1748   PHURINE 6.0 10/12/2017 1748   GLUCOSEU NEGATIVE 10/12/2017 1748   HGBUR NEGATIVE 10/12/2017 1748   BILIRUBINUR NEGATIVE 10/12/2017 1748   BILIRUBINUR Negative 07/16/2017 0828   KETONESUR NEGATIVE 10/12/2017 1748   PROTEINUR NEGATIVE 10/12/2017 1748   UROBILINOGEN 0.2 02/26/2007 1901   NITRITE NEGATIVE 10/12/2017 1748   LEUKOCYTESUR  NEGATIVE 10/12/2017 1748   LEUKOCYTESUR Negative 07/16/2017 0828    ----------------------------------------------------------------------------------------------------------------   Imaging results:   No results found.  My personal review of EKG: Sinus bradycardia at 58 bpm  Assessment & Plan  GI bleed;?  Voltaren gel     Status post EGD on the third with nonbleeding ulcer     Hemoglobin 7.8     Consult gastroenterology     DC Voltaren gel     Photonix IV  Syncope probably related to his anemia  GERD on Protonix  History of hypertension on Norvasc will place on hold  History of sinus bradycardia with pulse rate of 58 bpm  DVT Prophylaxis SCDs  AM Labs Ordered, also please review Full Orders    Code Status full  Disposition Plan: Home  Time spent in minutes : 42 minutes  Condition GUARDED   @SIGNATURE @

## 2017-10-25 NOTE — Telephone Encounter (Signed)
Incoming call from pt's wife stating pt had syncopal episode Pt was out for a brief time Pt was disoriented when he regained conscientiousness With pt's history of GI bleed and low hemoglobin pt instructed to go to ED for evaluation Dr Dettinger spoke with pt's wife to reemphasize recommendation Wife will transport pt to ED

## 2017-10-25 NOTE — ED Triage Notes (Signed)
Pt reports that he was walking down the hall and he began to feel "swimmy headed" and fell.  Denies LOC, and any subsequent injuries, denies pain at this time. Pt reports that grandson helped him up from the floor.

## 2017-10-25 NOTE — ED Notes (Signed)
Attempted report x2. Call back number left with secretary

## 2017-10-25 NOTE — ED Provider Notes (Signed)
[place] EMERGENCY DEPARTMENT Provider Note   CSN: [id number] Arrival date & time: 10/25/17  1437     History   Chief Complaint Chief Complaint  Patient presents with  . Fall    HPI Todd Berg is a 67 y.o. male.  HPI  Patient is a 67 year old male with PMHx of rectal cancer, COPD, GERD, and CVA who presents with syncopal episode this morning.  Patient states he woke up, felt lightheaded, and passed out.  His grandson was able to catch him. He denies head trauma.  No anticoagulation.  No chest pain or shortness of breath.  He was otherwise doing well after recently admission for upper GI bleed as he states "I think it's slowed down."  He denies hematemesis, hematuria, and BRBPR. No HA, vision changes, numbness, weakness, palpitations, CP, or SOB.  Per chart review patient recently seen in ED on 10/17/2017 for melanotic stools and discharged home after unremarkable work-up.  He was recently admitted for upper GI bleed and anemia on 10/12/2017.  Has known history of peptic ulcer disease in setting of NSAID use.  Started on PPI and underwent EGD on 10/3 that showed a small gastric nonbleeding ulcer.  Hemoglobin stable without need for transfusion.  At discharge Hgb 8.7.  Past Medical History:  Diagnosis Date  . Cancer ([place]) 2015   rectal  . Chronic back pain   . COPD (chronic obstructive pulmonary disease) ([place])   . GERD (gastroesophageal reflux disease)   . Hematemesis   . Hemorrhoids   . Hyperlipidemia   . Hypotension   . Melena   . Melena 10/12/2017  . Stroke [facility])     Patient Active Problem List   Diagnosis Date Noted  . Syncope 10/25/2017  . Melena 10/12/2017  . Hypokalemia 10/12/2017  . Sinus bradycardia 10/12/2017  . Anemia 10/10/2017  . Fatigue 10/10/2017  . Hypertension 03/21/2017  . Generalized anxiety disorder 03/21/2017  . Degenerative disc disease, lumbar 03/17/2017  . Iron deficiency anemia due to chronic blood loss 11/20/2016    . GI bleed 06/09/2016  . Chronic back pain   . COPD (chronic obstructive pulmonary disease) ([place])   . GERD (gastroesophageal reflux disease)   . Hemorrhoids   . History of rectal cancer 09/13/2015    Past Surgical History:  Procedure Laterality Date  . BACK SURGERY    . BIOPSY  10/13/2017   Procedure: BIOPSY;  Surgeon: Laurence Spates, MD;  Location: [facility] ENDOSCOPY;  Service: Endoscopy;;  . ESOPHAGOGASTRODUODENOSCOPY N/A 06/09/2016   Procedure: ESOPHAGOGASTRODUODENOSCOPY (EGD);  Surgeon: Wilford Corner, MD;  Location: [place];  Service: Endoscopy;  Laterality: N/A;  . ESOPHAGOGASTRODUODENOSCOPY (EGD) WITH PROPOFOL Left 11/21/2016   Procedure: ESOPHAGOGASTRODUODENOSCOPY (EGD) WITH PROPOFOL;  Surgeon: Arta Silence, MD;  Location[facility] ENDOSCOPY;  Service: Endoscopy;  Laterality: Left;  . ESOPHAGOGASTRODUODENOSCOPY (EGD) WITH PROPOFOL N/A 10/13/2017   Procedure: ESOPHAGOGASTRODUODENOSCOPY (EGD) WITH PROPOFOL;  Surgeon: Laurence Spates, MD;  Location: [place];  Service: Endoscopy;  Laterality: N/A;  . HEMORRHOID SURGERY    . HERNIA REPAIR    . RECTAL SURGERY          Home Medications    Prior to Admission medications   Medication Sig Start Date End Date Taking? Authorizing Provider  amLODipine (NORVASC) 2.5 MG tablet TAKE 1 TABLET DAILY Patient taking differently: Take 2.5 mg by mouth daily.  08/24/17  Yes Dettinger, [name], MD  diclofenac sodium (VOLTAREN) 1 % GEL Apply 2 g topically 4 (four) times daily. Patient  taking differently: Apply 2 g topically as needed.  10/06/17 11/05/17 Yes Rakes, Connye Burkitt, FNP  fluticasone (FLONASE) 50 MCG/ACT nasal spray Place 1 spray into both nostrils 2 (two) times daily as needed for allergies or rhinitis. 06/20/17  Yes Dettinger, [name], MD  pantoprazole (PROTONIX) 40 MG tablet Take 1 tablet (40 mg total) by mouth 2 (two) times daily. 10/14/17  Yes Ghimire, Henreitta Leber, MD  tetrahydrozoline 0.05 % ophthalmic solution Place 1 drop daily as  needed into both eyes (itching).   Yes [provider]  tiZANidine (ZANAFLEX) 2 MG tablet Take 1 tablet (2 mg total) by mouth every 6 (six) hours as needed for muscle spasms. 10/06/17  Yes Rakes, Connye Burkitt, FNP    Family History Family History  Problem Relation Age of Onset  . COPD Mother   . Stroke Father     Social History Social History   Tobacco Use  . Smoking status: Former Smoker    Start date: 12/30/1964    Last attempt to quit: 06/19/2009    Years since quitting: 8.3  . Smokeless tobacco: Former Systems developer    Types: Snuff    Quit date: 09/26/2005  Substance Use Topics  . Alcohol use: No  . Drug use: No     Allergies   Patient has no known allergies.   Review of Systems Review of Systems  Constitutional: Negative for chills and fever.  HENT: Negative for sore throat.   Eyes: Negative for pain and visual disturbance.  Respiratory: Negative for cough and shortness of breath.   Cardiovascular: Negative for chest pain and palpitations.  Gastrointestinal: Negative for abdominal pain, blood in stool, diarrhea, nausea and vomiting.  Genitourinary: Negative for dysuria and hematuria.  Musculoskeletal: Negative for arthralgias and back pain.  Skin: Negative for color change and rash.  Neurological: Positive for syncope and light-headedness. Negative for seizures.  All other systems reviewed and are negative.    Physical Exam Updated Vital Signs BP (!) 154/77 (BP Location: Left Arm)   Pulse 61   Temp 97.8 F (36.6 C) (Oral)   Resp 18   Ht 6' (1.829 m)   Wt 90.3 kg   SpO2 99%   BMI 27.00 kg/m   Physical Exam  Constitutional: He is oriented to person, place, and time. He appears well-developed and well-nourished.  HENT:  Head: Normocephalic and atraumatic.  Mouth/Throat: Oropharynx is clear and moist.  Eyes: Pupils are equal, round, and reactive to light. Conjunctivae and EOM are normal.  Neck: Normal range of motion. Neck supple.  Cardiovascular: Normal  rate, regular rhythm and intact distal pulses.  Pulmonary/Chest: Effort normal and breath sounds normal. No respiratory distress.  Abdominal: Soft. He exhibits no distension. There is no tenderness. There is no guarding.  Genitourinary: Rectal exam shows guaiac positive stool.  Genitourinary Comments: Melena on rectal exam. No gross blood. No visible fissures or hemorrhoids.  Musculoskeletal: He exhibits no edema.  Neurological: He is alert and oriented to person, place, and time. He has normal strength. No cranial nerve deficit or sensory deficit. Coordination normal. GCS eye subscore is 4. GCS verbal subscore is 5. GCS motor subscore is 6.  Skin: Skin is warm and dry. Capillary refill takes less than 2 seconds. No pallor.  Psychiatric: He has a normal mood and affect.  Nursing note and vitals reviewed.    ED Treatments / Results  Labs (all labs ordered are listed, but only abnormal results are displayed) Labs Reviewed  CBC WITH DIFFERENTIAL/PLATELET -  Abnormal; Notable for the following components:      Result Value   RBC 2.96 (*)    Hemoglobin 7.8 (*)    HCT 26.4 (*)    MCHC 29.5 (*)    Platelets 404 (*)    All other components within normal limits  COMPREHENSIVE METABOLIC PANEL - Abnormal; Notable for the following components:   Potassium 3.4 (*)    Calcium 8.7 (*)    Albumin 3.0 (*)    AST 12 (*)    All other components within normal limits  CBC - Abnormal; Notable for the following components:   RBC 3.00 (*)    Hemoglobin 8.1 (*)    HCT 26.3 (*)    All other components within normal limits  HEMOGLOBIN - Abnormal; Notable for the following components:   Hemoglobin 8.5 (*)    All other components within normal limits  HEMOGLOBIN AND HEMATOCRIT, BLOOD - Abnormal; Notable for the following components:   Hemoglobin 10.3 (*)    HCT 32.7 (*)    All other components within normal limits  POC OCCULT BLOOD, ED - Abnormal; Notable for the following components:   Fecal Occult  Bld POSITIVE (*)    All other components within normal limits  URINALYSIS, ROUTINE W REFLEX MICROSCOPIC  HEMOGLOBIN  HEMOGLOBIN  SAMPLE TO BLOOD BANK  TYPE AND SCREEN  PREPARE RBC (CROSSMATCH)    EKG EKG Interpretation  Date/Time:  Tuesday October 25 2017 15:21:11 EDT Ventricular Rate:  58 PR Interval:  174 QRS Duration: 96 QT Interval:  406 QTC Calculation: 398 R Axis:   55 Text Interpretation:  Sinus bradycardia Otherwise normal ECG similar to previous Confirmed by Theotis Burrow [phone]) on 10/25/2017 4:33:45 PM Also confirmed by Theotis Burrow [phone]), editor Lynder Parents [phone])  on 10/26/2017 9:27:45 AM   Radiology No results found.  Procedures Procedures (including critical care time)  Medications Ordered in ED Medications  pantoprazole (PROTONIX) injection 40 mg (has no administration in time range)  fluticasone (FLONASE) 50 MCG/ACT nasal spray 1 spray (has no administration in time range)  tiZANidine (ZANAFLEX) tablet 2 mg (has no administration in time range)  0.9 %  sodium chloride infusion ( Intravenous New Bag/Given 10/25/17 2054)  zolpidem (AMBIEN) tablet 5 mg (has no administration in time range)  ondansetron (ZOFRAN) tablet 4 mg (has no administration in time range)    Or  ondansetron (ZOFRAN) injection 4 mg (has no administration in time range)  acetaminophen (TYLENOL) tablet 650 mg (has no administration in time range)  pantoprazole (PROTONIX) injection 40 mg (40 mg Intravenous Given 10/25/17 1924)  0.9 %  sodium chloride infusion (Manually program via Guardrails IV Fluids) ( Intravenous New Bag/Given 10/25/17 2119)     Initial Impression / Assessment and Plan / ED Course  I have reviewed the triage vital signs and the nursing notes.  Pertinent labs & imaging results that were available during my care of the patient were reviewed by me and considered in my medical decision making (see chart for details).     Patient is a 67 year old male with  PMHx of rectal cancer, COPD, GERD, and CVA presents with syncopal episode this morning in setting of recent admission for upper GI bleed.  No head trauma.  Episode was brief.  On arrival he is HDS and well appearing.  Exam as above with benign soft abdomen. Rectal exam positive for melanotic stool.  No gross blood visualized.  Labs obtained and significant for Hgb 7.8.  Will give IV  PPI and transfuse 1U PRBC.  Kidney function stable as BUN 15 and Cr 1.06.  Platelets 404.  UA with blood or infection.  EKG showed sinus bradycardia at 58 bpm otherwise no signs of acute ischemia.  Syncope likely 2/2 GI bleed.  Discussed case with GI who will follow. Discussed case with hospitalist who will admit.    The plan for this patient was discussed with Dr. Rex Kras who voiced agreement and who oversaw evaluation and treatment of this patient.  Final Clinical Impressions(s) / ED Diagnoses   Final diagnoses:  Upper GI bleed  Syncope, unspecified syncope type    ED Discharge Orders    None       Fabian November, MD 10/26/17 [place], Wenda Overland, MD 10/26/17 1749

## 2017-10-25 NOTE — ED Provider Notes (Signed)
Patient placed in Quick Look pathway, seen and evaluated   Chief Complaint: fall  HPI:   Pt states he was walking in the hallway, states started feeling dizzy and fell. Denies hitting his head. Denies LOC. Denies any injuries. States son had to help him up. No longer feels dizzy.  Denies CP or SOB.   ROS: fall, dizziness, light headiness.   Physical Exam:   Gen: No distress  Neuro: Awake and Alert  Skin: Warm    Focused Exam: regular HR and rhythm. Lungs clear. No midline cervical, thoracic, lumbar tenderness. No abdominal tenderness.   Pt with prior CVA and GI bleed, here after feeling dizzy while walking and had a fall. No injuries from the fall.  Will check labs, EKG, urine analysis for further evaluation of his dizzy spell.  He is not dizzy at this time.  Patient may also need some orthostatics.  His blood pressure is borderline normal.  Vitals:   10/25/17 1458 10/25/17 1517  BP: 95/65   Pulse: 68   Resp: 16   Temp: 98 F (36.7 C)   TempSrc: Oral   SpO2: 100%   Weight:  90.4 kg  Height:  6' (1.829 m)      Initiation of care has begun. The patient has been counseled on the process, plan, and necessity for staying for the completion/evaluation, and the remainder of the medical screening examination    Jeannett Senior, PA-C 10/25/17 [place], Wenda Overland, MD 10/26/17 1425

## 2017-10-26 ENCOUNTER — Inpatient Hospital Stay (HOSPITAL_COMMUNITY): Payer: Medicare Other

## 2017-10-26 DIAGNOSIS — I10 Essential (primary) hypertension: Secondary | ICD-10-CM

## 2017-10-26 DIAGNOSIS — M79609 Pain in unspecified limb: Secondary | ICD-10-CM

## 2017-10-26 DIAGNOSIS — K219 Gastro-esophageal reflux disease without esophagitis: Secondary | ICD-10-CM

## 2017-10-26 DIAGNOSIS — K254 Chronic or unspecified gastric ulcer with hemorrhage: Secondary | ICD-10-CM

## 2017-10-26 LAB — CBC
HEMATOCRIT: 32.4 % — AB (ref 39.0–52.0)
Hemoglobin: 10.5 g/dL — ABNORMAL LOW (ref 13.0–17.0)
MCH: 27.3 pg (ref 26.0–34.0)
MCHC: 32.4 g/dL (ref 30.0–36.0)
MCV: 84.4 fL (ref 80.0–100.0)
Platelets: 370 10*3/uL (ref 150–400)
RBC: 3.84 MIL/uL — ABNORMAL LOW (ref 4.22–5.81)
RDW: 13.2 % (ref 11.5–15.5)
WBC: 6.3 10*3/uL (ref 4.0–10.5)
nRBC: 0 % (ref 0.0–0.2)

## 2017-10-26 LAB — BASIC METABOLIC PANEL
Anion gap: 9 (ref 5–15)
BUN: 10 mg/dL (ref 8–23)
CALCIUM: 9.1 mg/dL (ref 8.9–10.3)
CHLORIDE: 106 mmol/L (ref 98–111)
CO2: 24 mmol/L (ref 22–32)
CREATININE: 0.98 mg/dL (ref 0.61–1.24)
GFR calc non Af Amer: >60 mL/min (ref >60)
Glucose, Bld: 95 mg/dL (ref 70–99)
Potassium: 3.6 mmol/L (ref 3.5–5.1)
Sodium: 139 mmol/L (ref 135–145)

## 2017-10-26 LAB — HEMOGLOBIN AND HEMATOCRIT, BLOOD
HEMATOCRIT: 32.7 % — AB (ref 39.0–52.0)
Hemoglobin: 10.3 g/dL — ABNORMAL LOW (ref 13.0–17.0)

## 2017-10-26 MED ORDER — ACETAMINOPHEN 325 MG PO TABS: 650.0000 mg | ORAL_TABLET | ORAL | Status: DC | PRN

## 2017-10-26 NOTE — Progress Notes (Signed)
Patient complaining of a headache. No PRN medications available. RN notified on call NP, Schorr. Awaiting orders.  Ermalinda Memos, RN

## 2017-10-26 NOTE — Consult Note (Signed)
Referring Provider:  Dr. Janelle Floor Primary Care Physician:  Dettinger, [name], MD Primary Gastroenterologist:  Dr. Paulita Fujita (formerly followed at [facility])  Reason for Consultation: Syncope, melena, recent GI bleed  HPI: Todd Berg is a 67 y.o. male recently discharged from the hospital because of a GI bleed associated with a gastric ulcer and a large hiatal hernia seen on endoscopy on October 3.  He remained on aspirin until about a week ago.  Discharge hemoglobin was 8.7 and it was 8.3 when checked 5 days ago and 7.8 on presentation to the emergency room yesterday afternoon, at which time BUN was normal at 15 (it had been 37 when he presented with his GI bleed 2 weeks ago).  He apparently had had some syncope and a dark stool.  Since coming into the hospital, no further dark stools, vital signs stable, feels well, hungry, ready to go home.  Overnight he did receive 2 units of packed cells with an appropriate rise in hemoglobin.  In addition, there is a vague history of "colon cancer" which, by his description, was treated by transanal excision at [facility] many years ago.  His most recent colonoscopy, in 2017, was performed in [place] and was negative.   Past Medical History:  Diagnosis Date  . Cancer ([place]) 2015   rectal  . Chronic back pain   . COPD (chronic obstructive pulmonary disease) ([place])   . GERD (gastroesophageal reflux disease)   . Hematemesis   . Hemorrhoids   . Hyperlipidemia   . Hypotension   . Melena   . Melena 10/12/2017  . Stroke [facility])     Past Surgical History:  Procedure Laterality Date  . BACK SURGERY    . BIOPSY  10/13/2017   Procedure: BIOPSY;  Surgeon: Laurence Spates, MD;  Location: [facility] ENDOSCOPY;  Service: Endoscopy;;  . ESOPHAGOGASTRODUODENOSCOPY N/A 06/09/2016   Procedure: ESOPHAGOGASTRODUODENOSCOPY (EGD);  Surgeon: Wilford Corner, MD;  Location: [place];  Service: Endoscopy;  Laterality: N/A;  . ESOPHAGOGASTRODUODENOSCOPY  (EGD) WITH PROPOFOL Left 11/21/2016   Procedure: ESOPHAGOGASTRODUODENOSCOPY (EGD) WITH PROPOFOL;  Surgeon: Arta Silence, MD;  Location: [facility] ENDOSCOPY;  Service: Endoscopy;  Laterality: Left;  . ESOPHAGOGASTRODUODENOSCOPY (EGD) WITH PROPOFOL N/A 10/13/2017   Procedure: ESOPHAGOGASTRODUODENOSCOPY (EGD) WITH PROPOFOL;  Surgeon: Laurence Spates, MD;  Location: [place];  Service: Endoscopy;  Laterality: N/A;  . HEMORRHOID SURGERY    . HERNIA REPAIR    . RECTAL SURGERY      Prior to Admission medications   Medication Sig Start Date End Date Taking? Authorizing Provider  amLODipine (NORVASC) 2.5 MG tablet TAKE 1 TABLET DAILY Patient taking differently: Take 2.5 mg by mouth daily.  08/24/17  Yes Dettinger, [name], MD  diclofenac sodium (VOLTAREN) 1 % GEL Apply 2 g topically 4 (four) times daily. Patient taking differently: Apply 2 g topically as needed.  10/06/17 11/05/17 Yes Rakes, Connye Burkitt, FNP  fluticasone (FLONASE) 50 MCG/ACT nasal spray Place 1 spray into both nostrils 2 (two) times daily as needed for allergies or rhinitis. 06/20/17  Yes Dettinger, [name], MD  pantoprazole (PROTONIX) 40 MG tablet Take 1 tablet (40 mg total) by mouth 2 (two) times daily. 10/14/17  Yes Ghimire, Henreitta Leber, MD  tetrahydrozoline 0.05 % ophthalmic solution Place 1 drop daily as needed into both eyes (itching).   Yes [provider]  tiZANidine (ZANAFLEX) 2 MG tablet Take 1 tablet (2 mg total) by mouth every 6 (six) hours as needed for muscle spasms. 10/06/17  Yes Rakes, Connye Burkitt, FNP    Current Facility-Administered Medications  Medication Dose Route Frequency Provider Last Rate Last Dose  . acetaminophen (TYLENOL) tablet 650 mg  650 mg Oral Q4H PRN Schorr, Rhetta Mura, NP      . fluticasone (FLONASE) 50 MCG/ACT nasal spray 1 spray  1 spray Each Nare BID PRN Merton Border, MD      . ondansetron (ZOFRAN) tablet 4 mg  4 mg Oral Q6H PRN Merton Border, MD       Or  . ondansetron (ZOFRAN) injection 4 mg  4 mg  Intravenous Q6H PRN Merton Border, MD      . Derrill Memo ON 10/29/2017] pantoprazole (PROTONIX) injection 40 mg  40 mg Intravenous Q12H Merton Border, MD      . tiZANidine (ZANAFLEX) tablet 2 mg  2 mg Oral Q6H PRN Merton Border, MD      . zolpidem (AMBIEN) tablet 5 mg  5 mg Oral QHS PRN Merton Border, MD        Allergies as of 10/25/2017  . (No Known Allergies)    Family History  Problem Relation Age of Onset  . COPD Mother   . Stroke Father     Social History   Socioeconomic History  . Marital status: Married    Spouse name: Not on file  . Number of children: 5  . Years of education: Not on file  . Highest education level: Not on file  Occupational History  . Not on file  Social Needs  . Financial resource strain: Not on file  . Food insecurity:    Worry: Never true    Inability: Never true  . Transportation needs:    Medical: No    Non-medical: No  Tobacco Use  . Smoking status: Former Smoker    Start date: 12/30/1964    Last attempt to quit: 06/19/2009    Years since quitting: 8.3  . Smokeless tobacco: Former Systems developer    Types: Snuff    Quit date: 09/26/2005  Substance and Sexual Activity  . Alcohol use: No  . Drug use: No  . Sexual activity: Not on file  Lifestyle  . Physical activity:    Days per week: 0 days    Minutes per session: 0 min  . Stress: Not at all  Relationships  . Social connections:    Talks on phone: Once a week    Gets together: Never    Attends religious service: Never    Active member of club or organization: No    Attends meetings of clubs or organizations: Never    Relationship status: Married  . Intimate partner violence:    Fear of current or ex partner: Not on file    Emotionally abused: Not on file    Physically abused: Not on file    Forced sexual activity: Not on file  Other Topics Concern  . Not on file  Social History Narrative   Married, 5 children, several grandchildren    Review of Systems: See HPI  Physical Exam: Vital signs  in last 24 hours: Temp:  [97.6 F (36.4 C)-98.2 F (36.8 C)] 97.8 F (36.6 C) (10/16 0400) Pulse Rate:  [49-68] 61 (10/16 0400) Resp:  [11-20] 18 (10/16 0400) BP: (95-164)/(65-93) 154/77 (10/16 0400) SpO2:  [96 %-100 %] 99 % (10/16 0400) Weight:  [90.3 kg-90.4 kg] 90.3 kg (10/15 2118) Last BM Date: 10/25/17 General:   Alert,  Well-developed, well-nourished, pleasant and cooperative in NAD.  Heart normal, chest  clear, abdomen soft and nontender, skin warm, alert in no distress, vitals normal.  No overt pallor, no icterus, normal cognitive function, no evident focal neurologic deficits.   Intake/Output from previous day: 10/15 0701 - 10/16 0700 In: 699.4 [P.O.:360; I.V.:24.4; Blood:315] Out: 1100 [Urine:1100] Intake/Output this shift: Total I/O In: -  Out: 650 [Urine:650]  Lab Results: Recent Labs    10/25/17 1533 10/25/17 1830 10/25/17 2113 10/26/17 0725  WBC 5.9 6.2  --   --   HGB 7.8* 8.1* 8.5* 10.3*  HCT 26.4* 26.3*  --  32.7*  PLT 404* 380  --   --    BMET Recent Labs    10/25/17 1533  NA 138  K 3.4*  CL 106  CO2 25  GLUCOSE 87  BUN 15  CREATININE 1.06  CALCIUM 8.7*   LFT Recent Labs    10/25/17 1533  PROT 6.5  ALBUMIN 3.0*  AST 12*  ALT 11  ALKPHOS 78  BILITOT 0.4   PT/INR No results for input(s): LABPROT, INR in the last 72 hours.  Studies/Results: No results found.  Impression: The reason for the patient's syncope is not entirely clear.  It does not appear that the patient has had a recurrent GI bleed, but if he did, there is no evidence of ongoing bleeding.  Plan: 1.  Recheck labs now (ordered).  Regular diet ordered.  2.  If hemoglobin stable and BUN remains normal, okay for discharge.  3.  Patient already has follow-up scheduled with Dr. Arta Silence on October 24 in our office.  4.  The patient should remain off aspirin and nonsteroidal anti-inflammatory drugs.  5.  The patient and his family were advised that if he has evidence  of recurrent bleeding, he would need to return to the emergency room, but they are all strongly desirous of him going home.  I think this is reasonable.  6.  The patient should remain on PPI therapy at least until he is seen in our office.  Ideally, this would be twice daily dosing until it is clear that the patient has remained stable (it appears he was already on pantoprazole 40 twice daily prior to admission; it would be fine to continue that dose for the time being).  We will sign off.  Please call us if you have any questions pertaining to this patient's care, or if we can be of further assistance.   LOS: 1 day   [place]  10/26/2017, 12:59 PM   Pager [phone] If no answer or after 5 PM call [phone]

## 2017-10-26 NOTE — Progress Notes (Signed)
PROGRESS NOTE  Todd Berg XLK:[id number] DOB: 1950/11/08 DOA: 10/25/2017 PCP: Dettinger, [name], MD  HPI/Recap of past 24 hours: HPI from Dr Todd Berg  is a 67 y.o. male, with past medical history significant for rectal CA in 2015, HTN, GERD presenting after pre-syncopal episode with recurrent melena, dizziness and generalized weakness. The patient has been using Voltaren gel lately. Patient denied any abdominal pain, nausea or vomiting. Pre-syncopal episode occurred while he was getting out of bed and fell, denies hitting his head. No chest pains or palpitations.  Patient has been on Protonix p.o. at home. In the ED, hemoglobin in the ED was 7.8 with heme positive stools. Patient recently had an EGD on 10/13/17 which showed a non-bleeding gastric ulcer, biopsy negative for H.pylori or any malignancy. Pt admitted for further management.   Today, pt reported feeling slightly better, c/o R calf pain. Pt denies any abdominal tenderness, N/V, chest pain, SOB, fever/chills. No further melena noted since admission.  Assessment/Plan: Active Problems:   GI bleed   Syncope  ?Recurrent GI bleed Pt still on ?voltaren gel EGD done on 10/13/17 showed a non-bleeding gastric ulcer, biopsy negative for H.pylori or any malignancy  Hgb drop to 7.8 on admission, with FOBT positive S/P transfusion of 2U of PRBC with stable hemoglobin [facility] consulted Continue IV protonix  Acute on chronic blood loss anemia Likely due to above Currently stable s/p 2U of PRBC Daily CBC  GERD Continue protonix  HTN Continue to hold norvasc    Code Status: Full  Family Communication: Grandson at bedside  Disposition Plan: Likely on 10/27/2017   Consultants:  GI  Procedures:  None  Antimicrobials:  None  DVT prophylaxis: SCDs   Objective: Vitals:   10/26/17 0019 10/26/17 0030 10/26/17 0048 10/26/17 0400  BP: (!) 143/73 (!) 147/66 (!) 148/76 (!) 154/77  Pulse: (!) 56 (!) 57  (!) 56 61  Resp: 20 18 18 18   Temp: 97.9 F (36.6 C) 97.8 F (36.6 C) 97.6 F (36.4 C) 97.8 F (36.6 C)  TempSrc: Oral Oral Oral Oral  SpO2: 99% 96% 100% 99%  Weight:      Height:        Intake/Output Summary (Last 24 hours) at 10/26/2017 1214 Last data filed at 10/26/2017 1121 Gross per 24 hour  Intake 699.41 ml  Output 1450 ml  Net -750.59 ml   Filed Weights   10/25/17 1517 10/25/17 2118  Weight: 90.4 kg 90.3 kg    Exam:   General: NAD  Cardiovascular: S1, S2 present  Respiratory: CTA B  Abdomen: Soft, nontender, nondistended, bowel sounds present  Musculoskeletal: No pedal edema bilaterally, right calf tenderness  Skin: Normal  Psychiatry: Normal mood   Data Reviewed: CBC: Recent Labs  Lab 10/21/17 1106 10/25/17 1533 10/25/17 1830 10/25/17 2113 10/26/17 0725  WBC 7.3 5.9 6.2  --   --   NEUTROABS 4.4 3.3  --   --   --   HGB 8.3* 7.8* 8.1* 8.5* 10.3*  HCT 25.7* 26.4* 26.3*  --  32.7*  MCV 85 89.2 87.7  --   --   PLT 406 404* 380  --   --    Basic Metabolic Panel: Recent Labs  Lab 10/21/17 1106 10/25/17 1533  NA 141 138  K 3.6 3.4*  CL 102 106  CO2 24 25  GLUCOSE 105* 87  BUN 14 15  CREATININE 1.01 1.06  CALCIUM 8.9 8.7*   GFR: Estimated Creatinine Clearance: 73.2  mL/min (by C-G formula based on SCr of 1.06 mg/dL). Liver Function Tests: Recent Labs  Lab 10/21/17 1106 10/25/17 1533  AST 11 12*  ALT 9 11  ALKPHOS 95 78  BILITOT 0.3 0.4  PROT 6.4 6.5  ALBUMIN 3.7 3.0*   No results for input(s): LIPASE, AMYLASE in the last 168 hours. No results for input(s): AMMONIA in the last 168 hours. Coagulation Profile: No results for input(s): INR, PROTIME in the last 168 hours. Cardiac Enzymes: No results for input(s): CKTOTAL, CKMB, CKMBINDEX, TROPONINI in the last 168 hours. BNP (last 3 results) No results for input(s): PROBNP in the last 8760 hours. HbA1C: No results for input(s): HGBA1C in the last 72 hours. CBG: No results for  input(s): GLUCAP in the last 168 hours. Lipid Profile: No results for input(s): CHOL, HDL, LDLCALC, TRIG, CHOLHDL, LDLDIRECT in the last 72 hours. Thyroid Function Tests: No results for input(s): TSH, T4TOTAL, FREET4, T3FREE, THYROIDAB in the last 72 hours. Anemia Panel: No results for input(s): VITAMINB12, FOLATE, FERRITIN, TIBC, IRON, RETICCTPCT in the last 72 hours. Urine analysis:    Component Value Date/Time   COLORURINE YELLOW 10/25/2017 1651   APPEARANCEUR CLEAR 10/25/2017 1651   APPEARANCEUR Clear 07/16/2017 0828   LABSPEC 1.021 10/25/2017 1651   PHURINE 6.0 10/25/2017 1651   GLUCOSEU NEGATIVE 10/25/2017 1651   HGBUR NEGATIVE 10/25/2017 1651   BILIRUBINUR NEGATIVE 10/25/2017 1651   BILIRUBINUR Negative 07/16/2017 0828   KETONESUR NEGATIVE 10/25/2017 1651   PROTEINUR NEGATIVE 10/25/2017 1651   UROBILINOGEN 0.2 02/26/2007 1901   NITRITE NEGATIVE 10/25/2017 1651   LEUKOCYTESUR NEGATIVE 10/25/2017 1651   LEUKOCYTESUR Negative 07/16/2017 0828   Sepsis Labs: @LABRCNTIP (procalcitonin:4,lacticidven:4)  )No results found for this or any previous visit (from the past 240 hour(s)).    Studies: No results found.  Scheduled Meds: . [START ON 10/29/2017] pantoprazole  40 mg Intravenous Q12H    Continuous Infusions: . sodium chloride 50 mL/hr at 10/25/17 2054     LOS: 1 day     Alma Friendly, MD [facility]   If 7PM-7AM, please contact night-coverage [email or web address] 10/26/2017, 12:14 PM

## 2017-10-26 NOTE — Progress Notes (Addendum)
Bilateral lower extremity venous duplex completed. There is no evidence of a DVT or Baker's cyst. Toma Copier, RVS 10/26/2017 4:26 PM

## 2017-10-27 DIAGNOSIS — D62 Acute posthemorrhagic anemia: Principal | ICD-10-CM

## 2017-10-27 LAB — TYPE AND SCREEN
ABO/RH(D): O NEG
Antibody Screen: NEGATIVE
Unit division: 0
Unit division: 0

## 2017-10-27 LAB — CBC WITH DIFFERENTIAL/PLATELET
Abs Immature Granulocytes: 0.04 10*3/uL (ref 0.00–0.07)
BASOS PCT: 1 %
Basophils Absolute: 0.1 10*3/uL (ref 0.0–0.1)
Eosinophils Absolute: 0.2 10*3/uL (ref 0.0–0.5)
Eosinophils Relative: 2 %
HCT: 34.3 % — ABNORMAL LOW (ref 39.0–52.0)
Hemoglobin: 10.6 g/dL — ABNORMAL LOW (ref 13.0–17.0)
IMMATURE GRANULOCYTES: 1 %
Lymphocytes Relative: 27 %
Lymphs Abs: 2.3 10*3/uL (ref 0.7–4.0)
MCH: 26.3 pg (ref 26.0–34.0)
MCHC: 30.9 g/dL (ref 30.0–36.0)
MCV: 85.1 fL (ref 80.0–100.0)
MONO ABS: 0.9 10*3/uL (ref 0.1–1.0)
Monocytes Relative: 10 %
Neutro Abs: 5 10*3/uL (ref 1.7–7.7)
Neutrophils Relative %: 59 %
PLATELETS: 412 10*3/uL — AB (ref 150–400)
RBC: 4.03 MIL/uL — AB (ref 4.22–5.81)
RDW: 13.3 % (ref 11.5–15.5)
WBC: 8.5 10*3/uL (ref 4.0–10.5)
nRBC: 0 % (ref 0.0–0.2)

## 2017-10-27 LAB — BPAM RBC
Blood Product Expiration Date: 201911102359
Blood Product Expiration Date: 201911112359
ISSUE DATE / TIME: 201910160027
ISSUE DATE / TIME: 201910152112
Unit Type and Rh: 9500
Unit Type and Rh: 9500

## 2017-10-27 MED ORDER — CAPSAICIN-MENTHOL-METHYL SAL 0.025-1-12 % EX CREA: TOPICAL_CREAM | CUTANEOUS | 0 refills | Status: DC

## 2017-10-27 NOTE — Progress Notes (Signed)
Patient Discharge: Disposition: Patient discharged to home. Education: Reviewed medications, prescriptions, follow-up appointments and discharge instructions, verbalized understanding. IV: Discontinued IV before discharged. Telemetry: Discontinued Tele before discharge. Transportation: Patient escorted out of the unit in w/c.   Belongings: Patient took all his belongings with him.

## 2017-10-27 NOTE — Progress Notes (Signed)
Hgb stable 10.6.  Pt had 1 BM this morning (the first in the past 20 hrs)--looked nl to pt.   No dizziness upon walking to bathroom.  Ate almost entire breakfast.  IMPR:  Stable from GIB standpoint, and ok to go home from our perspective.    --Gastric ulcer while on ASA (H pylori neg on bx's 10/13/2017) --Large hiatal hernia --Acute post-hemorrhagic anemia, improving --h/o "colon cancer"  (?transanal excision---????large rectal polyp--done in [place], recds not currently available), colonoscopy neg 2017  PLAN:  Will sign off.   Recomm's as per yesterday's note.    Need to avoid ASA/NSAID's reviewed w/ pt.    (If pt really needed to be on such meds in the future, could co-treat w/ ongoing PPI prophylaxis, w/ low risk of recurrent ulceration).  Pt advised to discuss timing of f/u colonoscopy w/ Dr. Paulita Fujita (GI) when he seems him as scheduled Oct 24.  Cleotis Nipper, M.D. Pager [phone] If no answer or after 5 PM call [phone]

## 2017-10-27 NOTE — Discharge Summary (Signed)
Discharge Summary  Todd Berg MVH:[id number] DOB: 1950/03/09  PCP: Dettinger, [name], MD  Admit date: 10/25/2017 Discharge date: 10/27/2017  Time spent: 35 mins  Recommendations for Outpatient Follow-up:  1. PCP for follow up labs 2. GI follow up for a repeat EGD  Discharge Diagnoses:  Active Hospital Problems   Diagnosis Date Noted  . Syncope 10/25/2017  . GI bleed 06/09/2016    Resolved Hospital Problems  No resolved problems to display.    Discharge Condition: Stable  Diet recommendation: Heart healthy  Vitals:   10/27/17 0532 10/27/17 0805  BP: (!) 148/76 (!) 153/84  Pulse: 69 71  Resp: 18 18  Temp: 98.4 F (36.9 C) 98.2 F (36.8 C)  SpO2: 97% 97%    History of present illness:  StanleyPurdyis a66 y.o.male,with past medical history significant for rectal CA in 2015, HTN, GERD presenting after pre-syncopal episode with recurrent melena, dizziness and generalized weakness. The patient has been using Voltaren gel lately. Patient denied any abdominal pain, nausea or vomiting. Pre-syncopal episode occurred while he was getting out of bed and fell, denies hitting his head. No chest pains or palpitations. Patient has been on Protonix p.o. at home. In the ED, hemoglobin in the ED was 7.8 with heme positive stools. Patient recently had an EGD on 10/13/17 which showed a non-bleeding gastric ulcer, biopsy negative for H.pylori or any malignancy. Pt admitted for further management.   Today, pt denies any new complaints, no abdominal tenderness, N/V, chest pain, SOB, fever/chills. No further melena noted since admission. Pt to follow up with PCP and GI.  Hospital Course:  Active Problems:   GI bleed   Syncope  ?Recurrent GI bleed Pt still on ?voltaren gel, will d/c EGD done on 10/13/17 showed a non-bleeding gastric ulcer, biopsy negative for H.pylori or any malignancy  Hgb drop to 7.8 on admission, with FOBT positive S/P transfusion of 2U of PRBC with  stable hemoglobin [facility] consulted, will follow as an outpt Continue PO protonix  Acute on chronic blood loss anemia Likely due to above Currently stable s/p 2U of PRBC  GERD Continue protonix  HTN Continue home norvasc    Procedures:  None  Consultations:  GI  Discharge Exam: BP (!) 153/84 (BP Location: Left Arm)   Pulse 71   Temp 98.2 F (36.8 C) (Oral)   Resp 18   Ht 6' (1.829 m)   Wt 90.3 kg   SpO2 97%   BMI 27.00 kg/m   General: NAD Cardiovascular: S1, S2 present  Respiratory: CTAB  Discharge Instructions You were cared for by a hospitalist during your hospital stay. If you have any questions about your discharge medications or the care you received while you were in the hospital after you are discharged, you can call the unit and asked to speak with the hospitalist on call if the hospitalist that took care of you is not available. Once you are discharged, your primary care physician will handle any further medical issues. Please note that NO REFILLS for any discharge medications will be authorized once you are discharged, as it is imperative that you return to your primary care physician (or establish a relationship with a primary care physician if you do not have one) for your aftercare needs so that they can reassess your need for medications and monitor your lab values.   Allergies as of 10/27/2017   No Known Allergies     Medication List    STOP taking these medications  diclofenac sodium 1 % Gel Commonly known as:  VOLTAREN     TAKE these medications   amLODipine 2.5 MG tablet Commonly known as:  NORVASC TAKE 1 TABLET DAILY   Capsaicin-Menthol-Methyl Sal 0.025-1-12 % Crea Apply to neck for pain   fluticasone 50 MCG/ACT nasal spray Commonly known as:  FLONASE Place 1 spray into both nostrils 2 (two) times daily as needed for allergies or rhinitis.   pantoprazole 40 MG tablet Commonly known as:  PROTONIX Take 1 tablet (40 mg total)  by mouth 2 (two) times daily.   tetrahydrozoline 0.05 % ophthalmic solution Place 1 drop daily as needed into both eyes (itching).   tiZANidine 2 MG tablet Commonly known as:  ZANAFLEX Take 1 tablet (2 mg total) by mouth every 6 (six) hours as needed for muscle spasms.      No Known Allergies Follow-up Information    Dettinger, [name], MD. Schedule an appointment as soon as possible for a visit in 1 week(s).   Specialties:  Family Medicine, Cardiology Contact information: [place]  [address] [phone]            The results of significant diagnostics from this hospitalization (including imaging, microbiology, ancillary and laboratory) are listed below for reference.    Significant Diagnostic Studies: No results found.  Microbiology: No results found for this or any previous visit (from the past 240 hour(s)).   Labs: Basic Metabolic Panel: Recent Labs  Lab 10/21/17 1106 10/25/17 1533 10/26/17 1349  NA 141 138 139  K 3.6 3.4* 3.6  CL 102 106 106  CO2 24 25 24   GLUCOSE 105* 87 95  BUN 14 15 10   CREATININE 1.01 1.06 0.98  CALCIUM 8.9 8.7* 9.1   Liver Function Tests: Recent Labs  Lab 10/21/17 1106 10/25/17 1533  AST 11 12*  ALT 9 11  ALKPHOS 95 78  BILITOT 0.3 0.4  PROT 6.4 6.5  ALBUMIN 3.7 3.0*   No results for input(s): LIPASE, AMYLASE in the last 168 hours. No results for input(s): AMMONIA in the last 168 hours. CBC: Recent Labs  Lab 10/21/17 1106  10/25/17 1533 10/25/17 1830 10/25/17 2113 10/26/17 0725 10/26/17 1349 10/27/17 0530  WBC 7.3  --  5.9 6.2  --   --  6.3 8.5  NEUTROABS 4.4  --  3.3  --   --   --   --  5.0  HGB 8.3*   < > 7.8* 8.1* 8.5* 10.3* 10.5* 10.6*  HCT 25.7*  --  26.4* 26.3*  --  32.7* 32.4* 34.3*  MCV 85  --  89.2 87.7  --   --  84.4 85.1  PLT 406  --  404* 380  --   --  370 412*   < > = values in this interval not displayed.   Cardiac Enzymes: No results for input(s): CKTOTAL, CKMB, CKMBINDEX,  TROPONINI in the last 168 hours. BNP: BNP (last 3 results) No results for input(s): BNP in the last 8760 hours.  ProBNP (last 3 results) No results for input(s): PROBNP in the last 8760 hours.  CBG: No results for input(s): GLUCAP in the last 168 hours.     Signed:  Alma Friendly, MD [facility] 10/27/2017, 11:27 AM

## 2017-10-27 NOTE — Care Management Note (Signed)
Case Management Note  Patient Details  Name: EPIFANIO LABRADOR MRN: [id number] Date of Birth: 1951-01-07  Subjective/Objective:                    Action/Plan:  No discharge needs identified at this time , will continue to follow. Expected Discharge Date:                  Expected Discharge Plan:  Home/Self Care  In-House Referral:  NA  Discharge planning Services     Post Acute Care Choice:  NA Choice offered to:     DME Arranged:  N/A DME Agency:  NA  HH Arranged:  NA HH Agency:  NA  Status of Service:  In process, will continue to follow  If discussed at Long Length of Stay Meetings, dates discussed:    Additional Comments:  Marilu Favre, RN 10/27/2017, 10:22 AM

## 2017-11-01 ENCOUNTER — Other Ambulatory Visit: Payer: Self-pay

## 2017-11-01 NOTE — Patient Outreach (Signed)
[facility]) Care Management  11/01/2017  Todd Berg November 26, 1950 [id number]   Medication Adherence call to Mr. Javeion Cannedy spoke with patient's wife patient is still taking Atorvastatin 80 mg but sometimes he forgets to take it and he complains they are to big to swallow patient will ask doctor to change it to a smaller tablet or mg. Mr. Pagliarulo is showing past due under [facility].   [facility] Direct Dial [phone]  Fax [phone] [email or web address]

## 2017-11-17 ENCOUNTER — Encounter: Payer: Self-pay | Admitting: Family Medicine

## 2017-11-17 ENCOUNTER — Ambulatory Visit (INDEPENDENT_AMBULATORY_CARE_PROVIDER_SITE_OTHER): Payer: Medicare Other | Admitting: Family Medicine

## 2017-11-17 VITALS — BP 146/68 | HR 58 | Temp 96.7°F | Ht 72.0 in | Wt 199.2 lb

## 2017-11-17 DIAGNOSIS — K921 Melena: Secondary | ICD-10-CM

## 2017-11-17 DIAGNOSIS — D649 Anemia, unspecified: Secondary | ICD-10-CM

## 2017-11-17 NOTE — Progress Notes (Signed)
BP (!) 146/68   Pulse (!) 58   Temp (!) 96.7 F (35.9 C) (Oral)   Ht 6' (1.829 m)   Wt 199 lb 3.2 oz (90.4 kg)   BMI 27.02 kg/m    Subjective:    Patient ID: Todd Berg, male    DOB: [birth year], 67 y.o.   MRN: [id number]  HPI: Todd Berg is a 67 y.o. male presenting on 11/17/2017 for Hospitalization Follow-up (10/15 GI bleed)   HPI Hospital follow-up for GI bleed on 10/25/2017 Patient is coming in for hospital follow-up on GI bleed where he was at the hospital on 10/25/2017.  He says that he is feeling great and back to normal and denies any fatigue or chest pain or palpitations or flutters.  He denies any further melena or bleeding.  He says he has made sure to keep the aspirin off and has been doing well with it.  Patient denies any abdominal pain or nausea or vomiting or diarrhea or constipation currently.  Irritating skin tags Patient has a few skin tags under his left axilla that are irritated and catch and rub and bother him and he would like to have them removed.  He denies any fevers or chills or drainage or redness or warmth.  Relevant past medical, surgical, family and social history reviewed and updated as indicated. Interim medical history since our last visit reviewed. Allergies and medications reviewed and updated.  Review of Systems  Constitutional: Negative for chills and fever.  Eyes: Negative for visual disturbance.  Respiratory: Negative for shortness of breath and wheezing.   Cardiovascular: Negative for chest pain and leg swelling.  Gastrointestinal: Negative for abdominal pain, blood in stool, constipation, diarrhea, nausea and vomiting.  Musculoskeletal: Negative for back pain and gait problem.  Skin: Negative for rash.  All other systems reviewed and are negative.   Per HPI unless specifically indicated above   Allergies as of 11/17/2017   No Known Allergies     Medication List        Accurate as of 11/17/17  3:17 PM. Always use your  most recent med list.          amLODipine 2.5 MG tablet Commonly known as:  NORVASC TAKE 1 TABLET DAILY   Capsaicin-Menthol-Methyl Sal 0.025-1-12 % Crea Apply to neck for pain   fluticasone 50 MCG/ACT nasal spray Commonly known as:  FLONASE Place 1 spray into both nostrils 2 (two) times daily as needed for allergies or rhinitis.   pantoprazole 40 MG tablet Commonly known as:  PROTONIX Take 1 tablet (40 mg total) by mouth 2 (two) times daily.   tetrahydrozoline 0.05 % ophthalmic solution Place 1 drop daily as needed into both eyes (itching).   tiZANidine 2 MG tablet Commonly known as:  ZANAFLEX Take 1 tablet (2 mg total) by mouth every 6 (six) hours as needed for muscle spasms.          Objective:    BP (!) 146/68   Pulse (!) 58   Temp (!) 96.7 F (35.9 C) (Oral)   Ht 6' (1.829 m)   Wt 199 lb 3.2 oz (90.4 kg)   BMI 27.02 kg/m   Wt Readings from Last 3 Encounters:  11/17/17 199 lb 3.2 oz (90.4 kg)  10/26/17 199 lb 1.2 oz (90.3 kg)  10/21/17 199 lb 3.2 oz (90.4 kg)    Physical Exam  Constitutional: He is oriented to person, place, and time. He appears well-developed and well-nourished. No distress.  Eyes: Conjunctivae are normal. No scleral icterus.  Neck: Neck supple. No thyromegaly present.  Cardiovascular: Normal rate, regular rhythm, normal heart sounds and intact distal pulses.  No murmur heard. Pulmonary/Chest: Effort normal and breath sounds normal. No respiratory distress. He has no wheezes.  Musculoskeletal: Normal range of motion. He exhibits no edema.  Lymphadenopathy:    He has no cervical adenopathy.  Neurological: He is alert and oriented to person, place, and time. Coordination normal.  Skin: Skin is warm and dry. No rash noted. He is not diaphoretic.  3 skin tags in the left axilla  Psychiatric: He has a normal mood and affect. His behavior is normal.  Nursing note and vitals reviewed.  Skin tag removal: Using forceps and scissors excised  3skin tag. Used silver nitrate sticks for hemostasis and pressure dressing with topical antibiotic. Patient tolerated well and had minimal bleeding.     Assessment & Plan:   Problem List Items Addressed This Visit      Digestive   Melena   Relevant Orders   BMP8+EGFR   CBC with Differential/Platelet    Other Visit Diagnoses    Low hemoglobin    -  Primary   Relevant Orders   BMP8+EGFR   CBC with Differential/Platelet      Follow up plan: Return if symptoms worsen or fail to improve.  Counseling provided for all of the vaccine components Orders Placed This Encounter  Procedures  . BMP8+EGFR  . CBC with Differential/Platelet    Caryl Pina, MD [facility] 11/17/2017, 3:17 PM

## 2017-11-18 LAB — BMP8+EGFR
BUN / CREAT RATIO: 14 (ref 10–24)
BUN: 14 mg/dL (ref 8–27)
CHLORIDE: 104 mmol/L (ref 96–106)
CO2: 22 mmol/L (ref 20–29)
CREATININE: 1.03 mg/dL (ref 0.76–1.27)
Calcium: 8.8 mg/dL (ref 8.6–10.2)
GFR calc Af Amer: 86 mL/min/{1.73_m2} (ref >59)
GFR calc non Af Amer: 74 mL/min/{1.73_m2} (ref >59)
GLUCOSE: 116 mg/dL — AB (ref 65–99)
Potassium: 3.7 mmol/L (ref 3.5–5.2)
Sodium: 142 mmol/L (ref 134–144)

## 2017-11-18 LAB — CBC WITH DIFFERENTIAL/PLATELET
Basophils Absolute: 0.1 10*3/uL (ref 0.0–0.2)
Basos: 1 %
EOS (ABSOLUTE): 0.1 10*3/uL (ref 0.0–0.4)
EOS: 2 %
HEMOGLOBIN: 10.4 g/dL — AB (ref 13.0–17.7)
Hematocrit: 32.7 % — ABNORMAL LOW (ref 37.5–51.0)
Immature Grans (Abs): 0 10*3/uL (ref 0.0–0.1)
Immature Granulocytes: 0 %
LYMPHS ABS: 2 10*3/uL (ref 0.7–3.1)
Lymphs: 33 %
MCH: 26.3 pg — ABNORMAL LOW (ref 26.6–33.0)
MCHC: 31.8 g/dL (ref 31.5–35.7)
MCV: 83 fL (ref 79–97)
MONOCYTES: 8 %
Monocytes Absolute: 0.5 10*3/uL (ref 0.1–0.9)
NEUTROS PCT: 56 %
Neutrophils Absolute: 3.3 10*3/uL (ref 1.4–7.0)
PLATELETS: 375 10*3/uL (ref 150–450)
RBC: 3.95 x10E6/uL — ABNORMAL LOW (ref 4.14–5.80)
RDW: 13.5 % (ref 12.3–15.4)
WBC: 5.9 10*3/uL (ref 3.4–10.8)

## 2017-11-20 LAB — LIPID PANEL W/O CHOL/HDL RATIO
CHOLESTEROL TOTAL: 149 mg/dL (ref 100–199)
HDL: 36 mg/dL — AB (ref >39)
LDL CALC: 90 mg/dL (ref 0–99)
TRIGLYCERIDES: 113 mg/dL (ref 0–149)
VLDL Cholesterol Cal: 23 mg/dL (ref 5–40)

## 2017-11-20 LAB — SPECIMEN STATUS REPORT

## 2017-11-24 ENCOUNTER — Other Ambulatory Visit: Payer: Medicare Other

## 2017-11-24 DIAGNOSIS — D649 Anemia, unspecified: Secondary | ICD-10-CM

## 2017-11-24 LAB — CBC WITH DIFFERENTIAL/PLATELET
BASOS: 1 %
Basophils Absolute: 0.1 10*3/uL (ref 0.0–0.2)
EOS (ABSOLUTE): 0.1 10*3/uL (ref 0.0–0.4)
EOS: 2 %
HEMATOCRIT: 34.1 % — AB (ref 37.5–51.0)
Hemoglobin: 10.9 g/dL — ABNORMAL LOW (ref 13.0–17.7)
IMMATURE GRANS (ABS): 0 10*3/uL (ref 0.0–0.1)
IMMATURE GRANULOCYTES: 0 %
Lymphocytes Absolute: 1.7 10*3/uL (ref 0.7–3.1)
Lymphs: 23 %
MCH: 26.2 pg — ABNORMAL LOW (ref 26.6–33.0)
MCHC: 32 g/dL (ref 31.5–35.7)
MCV: 82 fL (ref 79–97)
MONOS ABS: 0.5 10*3/uL (ref 0.1–0.9)
Monocytes: 7 %
NEUTROS ABS: 4.7 10*3/uL (ref 1.4–7.0)
NEUTROS PCT: 67 %
Platelets: 305 10*3/uL (ref 150–450)
RBC: 4.16 x10E6/uL (ref 4.14–5.80)
RDW: 13.8 % (ref 12.3–15.4)
WBC: 7.1 10*3/uL (ref 3.4–10.8)

## 2017-11-28 ENCOUNTER — Telehealth: Payer: Self-pay | Admitting: Family Medicine

## 2017-11-28 NOTE — Telephone Encounter (Signed)
Patient aware and verbalizes understanding. 

## 2017-11-28 NOTE — Telephone Encounter (Signed)
For now he can stop it we will check it with his next lab values.

## 2017-11-28 NOTE — Telephone Encounter (Signed)
Patient wants to know if he still needs to take his Lipitor- states the pill is too big and has been off of it x 1 month. Please advise

## 2017-11-28 NOTE — Telephone Encounter (Signed)
No answer, no voicemail.

## 2017-11-29 NOTE — Telephone Encounter (Signed)
Wants labs results and question about this cholesterol rx

## 2017-11-29 NOTE — Telephone Encounter (Signed)
Aware of Dr. Neldon Mc recommendations.

## 2017-12-09 ENCOUNTER — Ambulatory Visit (INDEPENDENT_AMBULATORY_CARE_PROVIDER_SITE_OTHER): Payer: Medicare Other | Admitting: Nurse Practitioner

## 2017-12-09 ENCOUNTER — Encounter: Payer: Self-pay | Admitting: Nurse Practitioner

## 2017-12-09 VITALS — BP 126/72 | HR 61 | Temp 96.6°F | Ht 72.0 in | Wt 199.0 lb

## 2017-12-09 DIAGNOSIS — H6982 Other specified disorders of Eustachian tube, left ear: Secondary | ICD-10-CM

## 2017-12-09 MED ORDER — FLUTICASONE PROPIONATE 50 MCG/ACT NA SUSP: 1.0000 | Freq: Two times a day (BID) | NASAL | 11 refills | Status: DC | PRN

## 2017-12-09 NOTE — Progress Notes (Signed)
   Subjective:    Patient ID: Todd Berg, male    DOB: 08-29-1950, 67 y.o.   MRN: [id number]   Chief Complaint: Ear Pain (Left)   HPI Patient come sin today c/o left ear pain. Painful to touch. deneis ant hearing problems. No drainage.    Review of Systems  Constitutional: Negative for chills and fever.  HENT: Positive for ear pain. Negative for congestion, ear discharge, rhinorrhea, sinus pressure, sinus pain, sneezing, sore throat and trouble swallowing.   Respiratory: Negative for cough.   Cardiovascular: Negative.   Gastrointestinal: Negative.   Genitourinary: Negative.   Neurological: Negative for dizziness and headaches.  Psychiatric/Behavioral: Negative.   All other systems reviewed and are negative.      Objective:   Physical Exam  Constitutional: He appears well-developed and well-nourished. He appears distressed (mild).  HENT:  Right Ear: Hearing, external ear and ear canal normal. Tympanic membrane is not erythematous. A middle ear effusion (clear) is present.  Left Ear: Hearing, external ear and ear canal normal. Tympanic membrane is not erythematous. A middle ear effusion (clear) is present.  Nursing note and vitals reviewed.   BP 126/72   Pulse 61   Temp (!) 96.6 F (35.9 C) (Oral)   Ht 6' (1.829 m)   Wt 199 lb (90.3 kg)   BMI 26.99 kg/m       Assessment & Plan:  Todd Berg in today with chief complaint of Ear Pain (Left)   1. Dysfunction of left eustachian tube Meds ordered this encounter  Medications  . fluticasone (FLONASE) 50 MCG/ACT nasal spray    Sig: Place 1 spray into both nostrils 2 (two) times daily as needed for allergies or rhinitis.    Dispense:  16 g    Refill:  11    Order Specific Question:   Supervising Provider    Answer:   Todd Berg [[id number]]   Force fluids Motrin or tylenol as needed for pain RTO prn  Mary-Margaret Hassell Done, FNP

## 2017-12-09 NOTE — Patient Instructions (Signed)
Eustachian Tube Dysfunction The eustachian tube connects the middle ear to the back of the nose. It regulates air pressure in the middle ear by allowing air to move between the ear and nose. It also helps to drain fluid from the middle ear space. When the eustachian tube does not function properly, air pressure, fluid, or both can build up in the middle ear. Eustachian tube dysfunction can affect one or both ears. What are the causes? This condition happens when the eustachian tube becomes blocked or cannot open normally. This may result from:  Ear infections.  Colds and other upper respiratory infections.  Allergies.  Irritation, such as from cigarette smoke or acid from the stomach coming up into the esophagus (gastroesophageal reflux).  Sudden changes in air pressure, such as from descending in an airplane.  Abnormal growths in the nose or throat, such as nasal polyps, tumors, or enlarged tissue at the back of the throat (adenoids).  What increases the risk? This condition may be more likely to develop in people who smoke and people who are overweight. Eustachian tube dysfunction may also be more likely to develop in children, especially children who have:  Certain birth defects of the mouth, such as cleft palate.  Large tonsils and adenoids.  What are the signs or symptoms? Symptoms of this condition may include:  A feeling of fullness in the ear.  Ear pain.  Clicking or popping noises in the ear.  Ringing in the ear.  Hearing loss.  Loss of balance.  Symptoms may get worse when the air pressure around you changes, such as when you travel to an area of high elevation or fly on an airplane. How is this diagnosed? This condition may be diagnosed based on:  Your symptoms.  A physical exam of your ear, nose, and throat.  Tests, such as those that measure: ? The movement of your eardrum (tympanogram). ? Your hearing (audiometry).  How is this treated? Treatment  depends on the cause and severity of your condition. If your symptoms are mild, you may be able to relieve your symptoms by moving air into ("popping") your ears. If you have symptoms of fluid in your ears, treatment may include:  Decongestants.  Antihistamines.  Nasal sprays or ear drops that contain medicines that reduce swelling (steroids).  In some cases, you may need to have a procedure to drain the fluid in your eardrum (myringotomy). In this procedure, a small tube is placed in the eardrum to:  Drain the fluid.  Restore the air in the middle ear space.  Follow these instructions at home:  Take over-the-counter and prescription medicines only as told by your health care provider.  Use techniques to help pop your ears as recommended by your health care provider. These may include: ? Chewing gum. ? Yawning. ? Frequent, forceful swallowing. ? Closing your mouth, holding your nose closed, and gently blowing as if you are trying to blow air out of your nose.  Do not do any of the following until your health care provider approves: ? Travel to high altitudes. ? Fly in airplanes. ? Work in a pressurized cabin or room. ? Scuba dive.  Keep your ears dry. Dry your ears completely after showering or bathing.  Do not smoke.  Keep all follow-up visits as told by your health care provider. This is important. Contact a health care provider if:  Your symptoms do not go away after treatment.  Your symptoms come back after treatment.  You are   unable to pop your ears.  You have: ? A fever. ? Pain in your ear. ? Pain in your head or neck. ? Fluid draining from your ear.  Your hearing suddenly changes.  You become very dizzy.  You lose your balance. This information is not intended to replace advice given to you by your health care provider. Make sure you discuss any questions you have with your health care provider. Document Released: 01/24/2015 Document Revised: 06/05/2015  Document Reviewed: 01/16/2014 Elsevier Interactive Patient Education  2018 Elsevier Inc.  

## 2017-12-20 ENCOUNTER — Ambulatory Visit (INDEPENDENT_AMBULATORY_CARE_PROVIDER_SITE_OTHER): Payer: Medicare Other | Admitting: Family Medicine

## 2017-12-20 ENCOUNTER — Encounter: Payer: Self-pay | Admitting: Family Medicine

## 2017-12-20 VITALS — BP 128/70 | HR 69 | Temp 97.1°F | Ht 72.0 in | Wt 203.4 lb

## 2017-12-20 DIAGNOSIS — M51369 Other intervertebral disc degeneration, lumbar region without mention of lumbar back pain or lower extremity pain: Secondary | ICD-10-CM

## 2017-12-20 DIAGNOSIS — M5136 Other intervertebral disc degeneration, lumbar region: Secondary | ICD-10-CM

## 2017-12-20 DIAGNOSIS — S161XXD Strain of muscle, fascia and tendon at neck level, subsequent encounter: Secondary | ICD-10-CM | POA: Diagnosis not present

## 2017-12-20 DIAGNOSIS — S39012A Strain of muscle, fascia and tendon of lower back, initial encounter: Secondary | ICD-10-CM

## 2017-12-20 MED ORDER — TIZANIDINE HCL 2 MG PO TABS: 2.0000 mg | ORAL_TABLET | Freq: Four times a day (QID) | ORAL | 0 refills | Status: DC | PRN

## 2017-12-20 NOTE — Progress Notes (Signed)
BP 128/70   Pulse 69   Temp (!) 97.1 F (36.2 C) (Oral)   Ht 6' (1.829 m)   Wt 203 lb 6.4 oz (92.3 kg)   BMI 27.59 kg/m    Subjective:    Patient ID: Todd Berg, male    DOB: [birth year], 67 y.o.   MRN: [id number]  HPI: Todd Berg is a 67 y.o. male presenting on 12/20/2017 for Back Pain (ongoing but has gotten worse )   HPI Low back pain Patient comes in complaining of low back pain that is in the same area where he had plates and rods placed.  He describes the pain as bilateral and midline and denies any radiation of the pain down his legs or up his spine.  Patient denies any numbness or weakness in either of his legs or arms.  He says he has been having a lot of pain especially at night in his back over the past couple months since it has been increasing.  He denies any traumatic event that brought this up on him.  Patient rates the pain as mild in intensity during the day but severe at night.  He says he cannot lay down because of the pain.  Relevant past medical, surgical, family and social history reviewed and updated as indicated. Interim medical history since our last visit reviewed. Allergies and medications reviewed and updated.  Review of Systems  Constitutional: Negative for chills and fever.  Respiratory: Negative for shortness of breath and wheezing.   Cardiovascular: Negative for chest pain and leg swelling.  Gastrointestinal: Negative for abdominal pain, constipation, diarrhea, nausea and vomiting.  Musculoskeletal: Positive for back pain. Negative for gait problem.  Skin: Negative for rash.  Neurological: Negative for weakness, numbness and headaches.  All other systems reviewed and are negative.   Per HPI unless specifically indicated above   Allergies as of 12/20/2017   No Known Allergies     Medication List        Accurate as of 12/20/17  8:59 AM. Always use your most recent med list.          amLODipine 2.5 MG tablet Commonly known  as:  NORVASC TAKE 1 TABLET DAILY   Capsaicin-Menthol-Methyl Sal 0.025-1-12 % Crea Apply to neck for pain   fluticasone 50 MCG/ACT nasal spray Commonly known as:  FLONASE Place 1 spray into both nostrils 2 (two) times daily as needed for allergies or rhinitis.   pantoprazole 40 MG tablet Commonly known as:  PROTONIX Take 1 tablet (40 mg total) by mouth 2 (two) times daily.   tetrahydrozoline 0.05 % ophthalmic solution Place 1 drop daily as needed into both eyes (itching).   tiZANidine 2 MG tablet Commonly known as:  ZANAFLEX Take 1 tablet (2 mg total) by mouth every 6 (six) hours as needed for muscle spasms.          Objective:    BP 128/70   Pulse 69   Temp (!) 97.1 F (36.2 C) (Oral)   Ht 6' (1.829 m)   Wt 203 lb 6.4 oz (92.3 kg)   BMI 27.59 kg/m   Wt Readings from Last 3 Encounters:  12/20/17 203 lb 6.4 oz (92.3 kg)  12/09/17 199 lb (90.3 kg)  11/17/17 199 lb 3.2 oz (90.4 kg)    Physical Exam  Constitutional: He is oriented to person, place, and time. He appears well-developed and well-nourished. No distress.  Eyes: Conjunctivae are normal. No scleral icterus.  Cardiovascular: Normal  rate, regular rhythm, normal heart sounds and intact distal pulses.  No murmur heard. Pulmonary/Chest: Effort normal and breath sounds normal. No respiratory distress. He has no wheezes.  Musculoskeletal: Normal range of motion. He exhibits no edema.       Lumbar back: He exhibits normal range of motion, no tenderness (No tenderness to palpation but he says the pain is more deep), no swelling, no edema, no deformity and normal pulse.       Back:  Neurological: He is alert and oriented to person, place, and time. Coordination normal.  Skin: Skin is warm and dry. No rash noted. He is not diaphoretic.  Psychiatric: He has a normal mood and affect. His behavior is normal.  Nursing note and vitals reviewed.   Patient unable to get x-ray today because her x-ray is down, he will come  back tomorrow to get x-ray    Assessment & Plan:   Problem List Items Addressed This Visit      Musculoskeletal and Integument   Degenerative disc disease, lumbar - Primary   Relevant Medications   tiZANidine (ZANAFLEX) 2 MG tablet   Other Relevant Orders   DG Lumbar Spine 2-3 Views   Ambulatory referral to Physical Therapy    Other Visit Diagnoses    Strain of lumbar region, initial encounter       Relevant Medications   tiZANidine (ZANAFLEX) 2 MG tablet   Other Relevant Orders   DG Lumbar Spine 2-3 Views   Ambulatory referral to Physical Therapy   Cervical muscle strain, subsequent encounter       Relevant Medications   tiZANidine (ZANAFLEX) 2 MG tablet   Other Relevant Orders   DG Lumbar Spine 2-3 Views   Ambulatory referral to Physical Therapy      Will try physical therapy and muscle relaxer and see if we can calm this current flare down is been going on for the past couple months, if we cannot then we will consider further scanning and send back to neurosurgery, patient already has a fusion of his lumbar spine  Follow up plan: Return if symptoms worsen or fail to improve.  Counseling provided for all of the vaccine components Orders Placed This Encounter  Procedures  . DG Lumbar Spine 2-3 Views  . Ambulatory referral to Physical Therapy    Caryl Pina, MD [facility] 12/20/2017, 8:59 AM

## 2017-12-21 ENCOUNTER — Other Ambulatory Visit (INDEPENDENT_AMBULATORY_CARE_PROVIDER_SITE_OTHER): Payer: Medicare Other

## 2017-12-21 ENCOUNTER — Telehealth: Payer: Self-pay | Admitting: Family Medicine

## 2017-12-21 DIAGNOSIS — M5136 Other intervertebral disc degeneration, lumbar region: Secondary | ICD-10-CM | POA: Diagnosis not present

## 2017-12-21 DIAGNOSIS — S39012A Strain of muscle, fascia and tendon of lower back, initial encounter: Secondary | ICD-10-CM | POA: Diagnosis not present

## 2017-12-21 DIAGNOSIS — S161XXD Strain of muscle, fascia and tendon at neck level, subsequent encounter: Secondary | ICD-10-CM

## 2017-12-21 NOTE — Telephone Encounter (Signed)
Pt aware of x-ray results from today.

## 2017-12-27 ENCOUNTER — Ambulatory Visit: Payer: Medicare Other | Attending: Family Medicine | Admitting: Physical Therapy

## 2017-12-27 ENCOUNTER — Encounter: Payer: Self-pay | Admitting: Physical Therapy

## 2017-12-27 DIAGNOSIS — G8929 Other chronic pain: Secondary | ICD-10-CM | POA: Diagnosis present

## 2017-12-27 DIAGNOSIS — M545 Low back pain, unspecified: Secondary | ICD-10-CM

## 2017-12-27 DIAGNOSIS — M546 Pain in thoracic spine: Secondary | ICD-10-CM

## 2017-12-27 DIAGNOSIS — R41841 Cognitive communication deficit: Secondary | ICD-10-CM | POA: Diagnosis present

## 2017-12-27 NOTE — Therapy (Signed)
[facility], [place], [address] Phone: [phone]   Fax:  [phone]  Physical Therapy Evaluation  Patient Details  Name: Todd Berg MRN: [id number] Date of Birth: Oct 29, 1950 Referring Provider (PT): Caryl Pina MD   Encounter Date: 12/27/2017  PT End of Session - 12/27/17 1317    Visit Number  1    Number of Visits  12    Date for PT Re-Evaluation  02/07/18    PT Start Time  5701    PT Stop Time  1322    PT Time Calculation (min)  34 min    Activity Tolerance  Patient tolerated treatment well    Behavior During Therapy  [facility] for tasks assessed/performed       Past Medical History:  Diagnosis Date  . Cancer ([facility]) 2015   rectal  . Chronic back pain   . COPD (chronic obstructive pulmonary disease) ([place])   . GERD (gastroesophageal reflux disease)   . Hematemesis   . Hemorrhoids   . Hyperlipidemia   . Hypotension   . Melena   . Melena 10/12/2017  . Stroke [facility])     Past Surgical History:  Procedure Laterality Date  . BACK SURGERY    . BIOPSY  10/13/2017   Procedure: BIOPSY;  Surgeon: Laurence Spates, MD;  Location: [facility] ENDOSCOPY;  Service: Endoscopy;;  . ESOPHAGOGASTRODUODENOSCOPY N/A 06/09/2016   Procedure: ESOPHAGOGASTRODUODENOSCOPY (EGD);  Surgeon: Wilford Corner, MD;  Location: [place];  Service: Endoscopy;  Laterality: N/A;  . ESOPHAGOGASTRODUODENOSCOPY (EGD) WITH PROPOFOL Left 11/21/2016   Procedure: ESOPHAGOGASTRODUODENOSCOPY (EGD) WITH PROPOFOL;  Surgeon: Arta Silence, MD;  Location: [facility] ENDOSCOPY;  Service: Endoscopy;  Laterality: Left;  . ESOPHAGOGASTRODUODENOSCOPY (EGD) WITH PROPOFOL N/A 10/13/2017   Procedure: ESOPHAGOGASTRODUODENOSCOPY (EGD) WITH PROPOFOL;  Surgeon: Laurence Spates, MD;  Location: [place];  Service: Endoscopy;  Laterality: N/A;  . HEMORRHOID SURGERY    . HERNIA REPAIR    . RECTAL SURGERY      There were no vitals filed for this  visit.   Subjective Assessment - 12/27/17 1314    Subjective  The patient reports that he has had ongoing low back pain that now travels in between his shoulder blades.  He is having a good today without pain.  Standing and sitting too long increase his pain.    Pertinent History  Lumbar fusion, COPD, CVA, hypotension.    How long can you sit comfortably?  15-20 minutes.    How long can you stand comfortably?  15-20 minutes.    Patient Stated Goals  Reduce pain when I do stuff.    Currently in Pain?  No/denies         [facility] PT Assessment - 12/27/17 0001      Assessment   Medical Diagnosis  Degenerative disc disease, lumbar.    Referring Provider (PT)  Caryl Pina MD    Onset Date/Surgical Date  --   Ongoing.     Precautions   Precautions  None      Restrictions   Weight Bearing Restrictions  No      Balance Screen   Has the patient fallen in the past 6 months  Yes    How many times?  --   "Few times."   Has the patient had a decrease in activity level because of a fear of falling?   No    Is the patient reluctant to leave their home because of a fear of falling?   No  [place] residence      Prior Function   Level of Independence  Independent      Posture/Postural Control   Posture/Postural Control  Postural limitations    Postural Limitations  Rounded Shoulders;Forward head;Decreased lumbar lordosis      ROM / Strength   AROM / PROM / Strength  AROM;Strength      AROM   Overall AROM Comments  Active lumbar extension= 14 degrees and flexion is nearly full range.      Strength   Overall Strength Comments  Normal LE strength.      Palpation   Palpation comment  Pain "in" low back and c/o pain (when he hurts) between shoulder blades but no palpable pain today.      Special Tests   Other special tests  Absent LE DTR's, (-) SLR testing, (-) FABER testing.  Equal leg lengths.      Ambulation/Gait   Gait Comments   WNL.                Objective measurements completed on examination: See above findings.                   PT Long Term Goals - 12/27/17 1335      PT LONG TERM GOAL #1   Title  Independent with a HEP.    Time  6    Period  Weeks    Status  New      PT LONG TERM GOAL #2   Title  Perform ADL's with pain not > 3/10.    Time  6    Period  Weeks    Status  New             Plan - 12/27/17 1323    Clinical Impression Statement  The patient presents to OPPT with c/o low back and mid-back pain that has been ongoing.  He has a PMH remarkable for a lumbar fusion.  He was without pain today but states his pain increases with activity.  He reports when he is in pain it feels like the pain is "in" his low back and he exeriences further discomfort between his shoulder blades.         History and Personal Factors relevant to plan of care:  Lumbar fusion, COPD, CVA, hypotension.    Clinical Presentation  Stable    Clinical Decision Making  Low    Rehab Potential  Good    PT Frequency  2x / week    PT Duration  6 weeks    PT Treatment/Interventions  ADLs/Self Care Home Management;Cryotherapy;Electrical Stimulation;Ultrasound;Moist Heat;Therapeutic activities;Therapeutic exercise;Patient/family education;Manual techniques    PT Next Visit Plan  Modalites PRN, Core exercise progression to include scapular exercises.    Consulted and Agree with Plan of Care  Patient       Patient will benefit from skilled therapeutic intervention in order to improve the following deficits and impairments:  Pain, Decreased activity tolerance, Postural dysfunction  Visit Diagnosis: Chronic midline low back pain, unspecified whether sciatica present - Plan: PT plan of care cert/re-cert  Pain in thoracic spine - Plan: PT plan of care cert/re-cert     Problem List Patient Active Problem List   Diagnosis Date Noted  . Syncope 10/25/2017  . Melena 10/12/2017  . Hypokalemia  10/12/2017  . Sinus bradycardia 10/12/2017  . Anemia 10/10/2017  . Fatigue 10/10/2017  . Hypertension 03/21/2017  . Generalized anxiety disorder  03/21/2017  . Degenerative disc disease, lumbar 03/17/2017  . Iron deficiency anemia due to chronic blood loss 11/20/2016  . GI bleed 06/09/2016  . Chronic back pain   . COPD (chronic obstructive pulmonary disease) ([place])   . GERD (gastroesophageal reflux disease)   . Hemorrhoids   . History of rectal cancer 09/13/2015    Linda Biehn, [place] MPT 12/27/2017, 1:38 PM  [facility] [address] [place], [place], [address] Phone: [phone]   Fax:  [phone]  Name: Todd Berg MRN: [id number] Date of Birth: 1950/10/26

## 2017-12-29 ENCOUNTER — Ambulatory Visit: Payer: Medicare Other | Admitting: *Deleted

## 2017-12-29 DIAGNOSIS — M545 Low back pain, unspecified: Secondary | ICD-10-CM

## 2017-12-29 DIAGNOSIS — M546 Pain in thoracic spine: Secondary | ICD-10-CM

## 2017-12-29 DIAGNOSIS — G8929 Other chronic pain: Secondary | ICD-10-CM

## 2017-12-29 DIAGNOSIS — R41841 Cognitive communication deficit: Secondary | ICD-10-CM

## 2017-12-29 NOTE — Patient Instructions (Signed)
Isometric Abdominal   Lying on back with knees bent, tighten stomach by pulling navel down. Hold ____ seconds. Repeat __5__ times per set. Do __5__ sets per session. Do _3-5___ sessions per day.  http://orth.exer.us/1086   Copyright  VHI. All rights reserved.  Bent Leg Lift (Hook-Lying)   Tighten stomach and slowly raise right leg _6___ inches from floor. Keep trunk rigid. Hold _2-3___ seconds. Repeat _10___ times per set. Do _3___ sets per session. Do _2-3___ sessions per day.  http://orth.exer.us/1090   Copyright  VHI. All rights reserved.  Bridging   Slowly raise buttocks from floor, keeping stomach tight. Repeat __10__ times per set. Do _3___ sets per session. Do __2-3__ sessions per day.  http://orth.exer.us/1096   Copyright  VHI. All rights reserved.

## 2017-12-29 NOTE — Therapy (Addendum)
[facility] [address] [place], [place], [address] Phone: [phone]   Fax:  [phone]  Physical Therapy Treatment  Patient Details  Name: Todd Berg MRN: [id number] Date of Birth: 10/09/1950 Referring Provider (PT): Joshua Dettinger MD   Encounter Date: 12/29/2017  PT End of Session - 12/29/17 0817    Visit Number  2    Number of Visits  12    Date for PT Re-Evaluation  02/07/18    PT Start Time  0815    PT Stop Time  0905    PT Time Calculation (min)  50 min       Past Medical History:  Diagnosis Date  . Cancer ([facility]) 2015   rectal  . Chronic back pain   . COPD (chronic obstructive pulmonary disease) ([facility])   . GERD (gastroesophageal reflux disease)   . Hematemesis   . Hemorrhoids   . Hyperlipidemia   . Hypotension   . Melena   . Melena 10/12/2017  . Stroke ([facility])     Past Surgical History:  Procedure Laterality Date  . BACK SURGERY    . BIOPSY  10/13/2017   Procedure: BIOPSY;  Surgeon: Edwards, James, MD;  Location: [facility] ENDOSCOPY;  Service: Endoscopy;;  . ESOPHAGOGASTRODUODENOSCOPY N/A 06/09/2016   Procedure: ESOPHAGOGASTRODUODENOSCOPY (EGD);  Surgeon: Schooler, Vincent, MD;  Location: [facility] ENDOSCOPY;  Service: Endoscopy;  Laterality: N/A;  . ESOPHAGOGASTRODUODENOSCOPY (EGD) WITH PROPOFOL Left 11/21/2016   Procedure: ESOPHAGOGASTRODUODENOSCOPY (EGD) WITH PROPOFOL;  Surgeon: Outlaw, William, MD;  Location: [facility] ENDOSCOPY;  Service: Endoscopy;  Laterality: Left;  . ESOPHAGOGASTRODUODENOSCOPY (EGD) WITH PROPOFOL N/A 10/13/2017   Procedure: ESOPHAGOGASTRODUODENOSCOPY (EGD) WITH PROPOFOL;  Surgeon: Edwards, James, MD;  Location: [facility] ENDOSCOPY;  Service: Endoscopy;  Laterality: N/A;  . HEMORRHOID SURGERY    . HERNIA REPAIR    . RECTAL SURGERY      There were no vitals filed for this visit.  Subjective Assessment - 12/29/17 0816    Subjective  LBP gets worse as the day goes on    Pertinent History  Lumbar fusion,  COPD, CVA, hypotension.    How long can you sit comfortably?  15-20 minutes.    How long can you stand comfortably?  15-20 minutes.    Patient Stated Goals  Reduce pain when I do stuff.    Currently in Pain?  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      Exercises   Exercises  Lumbar;Knee/Hip      Lumbar Exercises: Stretches   Piriformis Stretch  Right;Left;5 reps;30 seconds      Lumbar Exercises: Aerobic   Nustep  x 10 mins L4 with focus on posture      Lumbar Exercises: Standing   Scapular Retraction  Strengthening;Both   XTS pink  2x20   Shoulder Extension  Strengthening;20 reps;10 reps   XTS pink 3 x10     Lumbar Exercises: Supine   Ab Set  10 reps;5 seconds    Bent Knee Raise  20 reps;5 seconds   marching   Bridge  20 reps;3 seconds             PT Education - 12/29/17 0840    Education Details    core activation exs    Person(s) Educated  Patient    Methods  Demonstration;Explanation;Handout;Verbal cues;Tactile cues    Comprehension  Verbalized understanding;Returned demonstration          PT Long Term Goals - 12/27/17 1335      PT LONG TERM GOAL #1   Title  Independent with a HEP.    Time  6    Period  Weeks    Status  New      PT LONG TERM GOAL #2   Title  Perform ADL's with pain not > 3/10.    Time  6    Period  Weeks    Status  New            Plan - 12/29/17 0900    Clinical Impression Statement  Pt arrived today with minimal LBP and was able to perform core activation exs as well as hip stretches without complaints. ADL body  mechanics were reviewed to aleve  postural related stress to LB. No complaints of pain end of session    Clinical Presentation  Stable    Rehab Potential  Good    PT Frequency  2x / week    PT Duration  6 weeks     PT Treatment/Interventions  ADLs/Self Care Home Management;Cryotherapy;Electrical Stimulation;Ultrasound;Moist Heat;Therapeutic activities;Therapeutic exercise;Patient/family education;Manual techniques    PT Next Visit Plan  Modalites PRN, Core exercise progression to include scapular exercises.    Consulted and Agree with Plan of Care  Patient       Patient will benefit from skilled therapeutic intervention in order to improve the following deficits and impairments:  Pain, Decreased activity tolerance, Postural dysfunction  Visit Diagnosis: Chronic midline low back pain, unspecified whether sciatica present  Pain in thoracic spine  Cognitive communication deficit     Problem List Patient Active Problem List   Diagnosis Date Noted  . Syncope 10/25/2017  . Melena 10/12/2017  . Hypokalemia 10/12/2017  . Sinus bradycardia 10/12/2017  . Anemia 10/10/2017  . Fatigue 10/10/2017  . Hypertension 03/21/2017  . Generalized anxiety disorder 03/21/2017  . Degenerative disc disease, lumbar 03/17/2017  . Iron deficiency anemia due to chronic blood loss 11/20/2016  . GI bleed 06/09/2016  . Chronic back pain   . COPD (chronic obstructive pulmonary disease) ([facility])   . GERD (gastroesophageal reflux disease)   . Hemorrhoids   . History of rectal cancer 09/13/2015    RAMSEUR,CHRIS, PTA 12/29/2017, 9:10 AM  [facility] [address] [place], [place], [address] Phone: [phone]   Fax:  [phone]  Name: Todd Berg MRN: [id number] Date of Birth: 03/28/1950  PHYSICAL THERAPY DISCHARGE SUMMARY  Visits from Start of Care: 2.  Current functional level related to goals / functional outcomes: See above.   Remaining deficits: See below.   Education / Equipment: HEP Plan: Patient agrees to discharge.  Patient goals were not met. Patient is being discharged due to not returning since the last visit.  ?????          Chad  Applegate MPT  

## 2018-01-05 ENCOUNTER — Ambulatory Visit: Payer: Medicare Other | Admitting: *Deleted

## 2018-01-13 ENCOUNTER — Encounter: Payer: Self-pay | Admitting: *Deleted

## 2018-01-13 ENCOUNTER — Telehealth: Payer: Self-pay | Admitting: *Deleted

## 2018-01-13 ENCOUNTER — Encounter: Payer: Self-pay | Admitting: Family Medicine

## 2018-01-13 ENCOUNTER — Ambulatory Visit (INDEPENDENT_AMBULATORY_CARE_PROVIDER_SITE_OTHER): Payer: Medicare Other | Admitting: Family Medicine

## 2018-01-13 VITALS — BP 130/74 | HR 61 | Temp 97.5°F | Ht 72.0 in | Wt 198.0 lb

## 2018-01-13 DIAGNOSIS — H66002 Acute suppurative otitis media without spontaneous rupture of ear drum, left ear: Secondary | ICD-10-CM

## 2018-01-13 DIAGNOSIS — B079 Viral wart, unspecified: Secondary | ICD-10-CM

## 2018-01-13 DIAGNOSIS — B078 Other viral warts: Secondary | ICD-10-CM

## 2018-01-13 MED ORDER — PANTOPRAZOLE SODIUM 40 MG PO TBEC: 40.0000 mg | DELAYED_RELEASE_TABLET | Freq: Two times a day (BID) | ORAL | 0 refills | Status: DC

## 2018-01-13 MED ORDER — AMOXICILLIN 875 MG PO TABS: 875.0000 mg | ORAL_TABLET | Freq: Two times a day (BID) | ORAL | 0 refills | Status: DC

## 2018-01-13 MED ORDER — AMOXICILLIN 400 MG/5ML PO SUSR: 800.0000 mg | Freq: Two times a day (BID) | ORAL | 0 refills | Status: AC

## 2018-01-13 MED ORDER — PANTOPRAZOLE SODIUM 40 MG PO TBEC: 40.0000 mg | DELAYED_RELEASE_TABLET | Freq: Two times a day (BID) | ORAL | 1 refills | Status: DC

## 2018-01-13 NOTE — Telephone Encounter (Signed)
Patient aware.

## 2018-01-13 NOTE — Progress Notes (Signed)
Subjective:    Patient ID: Todd Berg, male    DOB: November 02, 1950, 68 y.o.   MRN: [id number]  Chief Complaint:  Skin Problem (lesion on right side, ongoing for months) and Left ear pain   HPI: Todd Berg is a 68 y.o. male presenting on 01/13/2018 for Skin Problem (lesion on right side, ongoing for months) and Left ear pain  Pt presents to the office today for complaints of left ear pain and fullness. Pt states this has been ongoing for 5 days and is not getting better. Pt denies over the counter or home remedies. Pt also complains of a lesion on his right side. Pt states this has been there for several months, states he tries to scratch it off and it just bleeds and then returns.   Relevant past medical, surgical, family, and social history reviewed and updated as indicated.  Allergies and medications reviewed and updated.   Past Medical History:  Diagnosis Date  . Cancer ([place]) 2015   rectal  . Chronic back pain   . COPD (chronic obstructive pulmonary disease) ([place])   . GERD (gastroesophageal reflux disease)   . Hematemesis   . Hemorrhoids   . Hyperlipidemia   . Hypotension   . Melena   . Melena 10/12/2017  . Stroke [facility])     Past Surgical History:  Procedure Laterality Date  . BACK SURGERY    . BIOPSY  10/13/2017   Procedure: BIOPSY;  Surgeon: Laurence Spates, MD;  Location: [facility] ENDOSCOPY;  Service: Endoscopy;;  . ESOPHAGOGASTRODUODENOSCOPY N/A 06/09/2016   Procedure: ESOPHAGOGASTRODUODENOSCOPY (EGD);  Surgeon: Wilford Corner, MD;  Location: [place];  Service: Endoscopy;  Laterality: N/A;  . ESOPHAGOGASTRODUODENOSCOPY (EGD) WITH PROPOFOL Left 11/21/2016   Procedure: ESOPHAGOGASTRODUODENOSCOPY (EGD) WITH PROPOFOL;  Surgeon: Arta Silence, MD;  Location: [facility] ENDOSCOPY;  Service: Endoscopy;  Laterality: Left;  . ESOPHAGOGASTRODUODENOSCOPY (EGD) WITH PROPOFOL N/A 10/13/2017   Procedure: ESOPHAGOGASTRODUODENOSCOPY (EGD) WITH PROPOFOL;  Surgeon: Laurence Spates,  MD;  Location: [place];  Service: Endoscopy;  Laterality: N/A;  . HEMORRHOID SURGERY    . HERNIA REPAIR    . RECTAL SURGERY      Social History   Socioeconomic History  . Marital status: Married    Spouse name: Not on file  . Number of children: 5  . Years of education: Not on file  . Highest education level: Not on file  Occupational History  . Not on file  Social Needs  . Financial resource strain: Not on file  . Food insecurity:    Worry: Never true    Inability: Never true  . Transportation needs:    Medical: No    Non-medical: No  Tobacco Use  . Smoking status: Former Smoker    Start date: 12/30/1964    Last attempt to quit: 06/19/2009    Years since quitting: 8.5  . Smokeless tobacco: Former Systems developer    Types: Snuff    Quit date: 09/26/2005  Substance and Sexual Activity  . Alcohol use: No  . Drug use: No  . Sexual activity: Not on file  Lifestyle  . Physical activity:    Days per week: 0 days    Minutes per session: 0 min  . Stress: Not at all  Relationships  . Social connections:    Talks on phone: Once a week    Gets together: Never    Attends religious service: Never    Active member of club or organization: No  Attends meetings of clubs or organizations: Never    Relationship status: Married  . Intimate partner violence:    Fear of current or ex partner: Not on file    Emotionally abused: Not on file    Physically abused: Not on file    Forced sexual activity: Not on file  Other Topics Concern  . Not on file  Social History Narrative   Married, 5 children, several grandchildren    Outpatient Encounter Medications as of 01/13/2018  Medication Sig  . amLODipine (NORVASC) 2.5 MG tablet TAKE 1 TABLET DAILY (Patient taking differently: Take 2.5 mg by mouth daily. )  . Capsaicin-Menthol-Methyl Sal (CAPSAICIN-METHYL SAL-MENTHOL) 0.025-1-12 % CREA Apply to neck for pain  . fluticasone (FLONASE) 50 MCG/ACT nasal spray Place 1 spray into both nostrils 2  (two) times daily as needed for allergies or rhinitis.  . pantoprazole (PROTONIX) 40 MG tablet Take 1 tablet (40 mg total) by mouth 2 (two) times daily.  Marland Kitchen tetrahydrozoline 0.05 % ophthalmic solution Place 1 drop daily as needed into both eyes (itching).  Marland Kitchen amoxicillin (AMOXIL) 875 MG tablet Take 1 tablet (875 mg total) by mouth 2 (two) times daily for 10 days. 1 po BID  . tiZANidine (ZANAFLEX) 2 MG tablet Take 1 tablet (2 mg total) by mouth every 6 (six) hours as needed for muscle spasms. (Patient not taking: Reported on 01/13/2018)   No facility-administered encounter medications on file as of 01/13/2018.     No Known Allergies  Review of Systems  Constitutional: Negative for chills, fatigue and fever.  HENT: Positive for ear pain (left) and tinnitus. Negative for congestion, sinus pressure and sinus pain.   Respiratory: Negative for cough and shortness of breath.   Cardiovascular: Negative for chest pain, palpitations and leg swelling.  Gastrointestinal: Negative for abdominal pain.  Musculoskeletal: Negative for arthralgias and myalgias.  Skin: Positive for rash. Negative for color change and pallor.  Neurological: Negative for dizziness, weakness, numbness and headaches.  Psychiatric/Behavioral: Negative for confusion.  All other systems reviewed and are negative.       Objective:    BP 130/74   Pulse 61   Temp (!) 97.5 F (36.4 C) (Oral)   Ht 6' (1.829 m)   Wt 198 lb (89.8 kg)   BMI 26.85 kg/m    Wt Readings from Last 3 Encounters:  01/13/18 198 lb (89.8 kg)  12/20/17 203 lb 6.4 oz (92.3 kg)  12/09/17 199 lb (90.3 kg)    Physical Exam Vitals signs and nursing note reviewed.  Constitutional:      Appearance: Normal appearance. He is well-developed, well-groomed and overweight.  HENT:     Head: Normocephalic and atraumatic.     Right Ear: Hearing, tympanic membrane, ear canal and external ear normal.     Left Ear: Hearing, ear canal and external ear normal. Tympanic  membrane is erythematous and bulging. Tympanic membrane is not perforated.     Nose: Nose normal.     Mouth/Throat:     Lips: Pink.     Mouth: Mucous membranes are moist.     Pharynx: Oropharynx is clear. Uvula midline.  Eyes:     General: Lids are normal.     Extraocular Movements: Extraocular movements intact.     Conjunctiva/sclera: Conjunctivae normal.     Pupils: Pupils are equal, round, and reactive to light.  Neck:     Musculoskeletal: Neck supple.     Trachea: Trachea and phonation normal.  Cardiovascular:  Rate and Rhythm: Normal rate and regular rhythm.     Heart sounds: Normal heart sounds. No murmur. No friction rub. No gallop.   Pulmonary:     Effort: Pulmonary effort is normal. No respiratory distress.     Breath sounds: Normal breath sounds.  Lymphadenopathy:     Cervical: Cervical adenopathy (mild anterior) present.  Skin:    General: Skin is warm and dry.     Capillary Refill: Capillary refill takes less than 2 seconds.       Neurological:     Mental Status: He is alert and oriented to person, place, and time.  Psychiatric:        Mood and Affect: Mood normal.        Behavior: Behavior normal. Behavior is cooperative.        Thought Content: Thought content normal.        Judgment: Judgment normal.     Results for orders placed or performed in visit on 11/24/17  CBC with Differential/Platelet  Result Value Ref Range   WBC 7.1 3.4 - 10.8 x10E3/uL   RBC 4.16 4.14 - 5.80 x10E6/uL   Hemoglobin 10.9 (L) 13.0 - 17.7 g/dL   Hematocrit 34.1 (L) 37.5 - 51.0 %   MCV 82 79 - 97 fL   MCH 26.2 (L) 26.6 - 33.0 pg   MCHC 32.0 31.5 - 35.7 g/dL   RDW 13.8 12.3 - 15.4 %   Platelets 305 150 - 450 x10E3/uL   Neutrophils 67 Not Estab. %   Lymphs 23 Not Estab. %   Monocytes 7 Not Estab. %   Eos 2 Not Estab. %   Basos 1 Not Estab. %   Neutrophils Absolute 4.7 1.4 - 7.0 x10E3/uL   Lymphocytes Absolute 1.7 0.7 - 3.1 x10E3/uL   Monocytes Absolute 0.5 0.1 - 0.9  x10E3/uL   EOS (ABSOLUTE) 0.1 0.0 - 0.4 x10E3/uL   Basophils Absolute 0.1 0.0 - 0.2 x10E3/uL   Immature Granulocytes 0 Not Estab. %   Immature Grans (Abs) 0.0 0.0 - 0.1 x10E3/uL     Cryotherapy: Pts name and DOB verified. Verbal consent obtained. Pt verbalized understanding or procedure and repeated procedure to provider. Liquid canister applied via canister method leaving 1 mm of frost around the lesion, 2 freeze thaw cycles. Pt tolerated procedure well.   Pertinent labs & imaging results that were available during my care of the patient were reviewed by me and considered in my medical decision making.  Assessment & Plan:  Kenji was seen today for skin problem and left ear pain.  Diagnoses and all orders for this visit:  Verruca vulgaris Cryotherapy in office today. Pt aware of aftercare instructions and aware he will need repeat therapy in 2-4 weeks.  Acute suppurative otitis media of left ear without spontaneous rupture of tympanic membrane, recurrence not specified Avoid cigarette smoke. Tylenol for fever and pain control. Medications as prescribed. Report any new or worsening symptoms.  -     amoxicillin (AMOXIL) 875 MG tablet; Take 1 tablet (875 mg total) by mouth 2 (two) times daily for 10 days. 1 po BID   Continue all other maintenance medications.  Follow up plan: Return in about 4 weeks (around 02/10/2018), or if symptoms worsen or fail to improve.  Educational handout given for cryotherapy aftercare  The above assessment and management plan was discussed with the patient. The patient verbalized understanding of and has agreed to the management plan. Patient is aware to call the clinic if  symptoms persist or worsen. Patient is aware when to return to the clinic for a follow-up visit. Patient educated on when it is appropriate to go to the emergency department.   Monia Pouch, FNP-C [facility] [phone]

## 2018-01-13 NOTE — Telephone Encounter (Signed)
Patient is unable to swallow the amoxicillin given today can you change to liquid

## 2018-01-13 NOTE — Patient Instructions (Signed)
Caring for yourself after cryotherapy Starting the day after your procedure, wash the treated area gently with fragrance-free soap and water daily.  Leave the treated area uncovered unless it has ulcers or drainage. If you see any drainage, apply petroleum jelly (Vaseline) on the treated area and cover with a bandage (Band-Aid) if necessary.  If you have any bleeding, press firmly on the area with a clean gauze pad for 15 minutes. If the bleeding doesn't stop, repeat this step. If the bleeding still hasn't stopped after repeating this step, call your doctor's office.  Do not use perfumed soaps, cosmetics, or lotions on the treated area(s) until it has healed. This will usually be at least 10 days after your procedure.  You may have hair loss on the treated area. This depends on how deep the freezing went. The hair loss may be permanent.  Once the treated area has healed, apply a broad-spectrum sunscreen with an SPF of at least 30 to the area to protect it from scarring.  You may have discoloration (pinkness, redness, or lighter or darker skin) at the treated area for up to 1 year after your procedure. Some people may have it for even longer or it may be permanent.   Call Your [place] if you have: A temperature of 100.4 F (38 C) or higher  Chills  Any of the following symptoms at your wound or the area around it:  Redness or swelling that extends to areas of untreated skin  Increasing pain or discomfort in the treated area  Skin in the treated area that is hot or hard to the touch  Increasing oozing, or drainage (yellow or green) from the treated area  Foul odor  Bleeding that does not stop after applying pressure Any questions or concerns  Any problems you did not expect

## 2018-01-13 NOTE — Telephone Encounter (Signed)
I sent the liquid amoxicillin for him but if he is having trouble swallowing he needs to get back into his gastroenterologist and start taking his Protonix more consistently again.

## 2018-01-16 ENCOUNTER — Emergency Department (HOSPITAL_COMMUNITY)
Admission: EM | Admit: 2018-01-16 | Discharge: 2018-01-16 | Disposition: A | Discharge status: Home / Self Care | Payer: Medicare Other | Attending: Emergency Medicine | Admitting: Emergency Medicine

## 2018-01-16 ENCOUNTER — Emergency Department (HOSPITAL_COMMUNITY): Payer: Medicare Other

## 2018-01-16 ENCOUNTER — Other Ambulatory Visit: Payer: Self-pay

## 2018-01-16 ENCOUNTER — Encounter (HOSPITAL_COMMUNITY): Payer: Self-pay | Admitting: Emergency Medicine

## 2018-01-16 ENCOUNTER — Telehealth: Payer: Self-pay | Admitting: *Deleted

## 2018-01-16 ENCOUNTER — Ambulatory Visit: Payer: Medicare Other

## 2018-01-16 DIAGNOSIS — Z85048 Personal history of other malignant neoplasm of rectum, rectosigmoid junction, and anus: Secondary | ICD-10-CM | POA: Diagnosis not present

## 2018-01-16 DIAGNOSIS — J449 Chronic obstructive pulmonary disease, unspecified: Secondary | ICD-10-CM | POA: Insufficient documentation

## 2018-01-16 DIAGNOSIS — Z87891 Personal history of nicotine dependence: Secondary | ICD-10-CM | POA: Insufficient documentation

## 2018-01-16 DIAGNOSIS — Z79899 Other long term (current) drug therapy: Secondary | ICD-10-CM | POA: Insufficient documentation

## 2018-01-16 DIAGNOSIS — R0781 Pleurodynia: Secondary | ICD-10-CM | POA: Diagnosis not present

## 2018-01-16 DIAGNOSIS — Z8673 Personal history of transient ischemic attack (TIA), and cerebral infarction without residual deficits: Secondary | ICD-10-CM | POA: Diagnosis not present

## 2018-01-16 DIAGNOSIS — R079 Chest pain, unspecified: Secondary | ICD-10-CM | POA: Diagnosis present

## 2018-01-16 LAB — BASIC METABOLIC PANEL
ANION GAP: 7 (ref 5–15)
BUN: 18 mg/dL (ref 8–23)
CO2: 26 mmol/L (ref 22–32)
Calcium: 9.1 mg/dL (ref 8.9–10.3)
Chloride: 106 mmol/L (ref 98–111)
Creatinine, Ser: 0.97 mg/dL (ref 0.61–1.24)
GFR calc Af Amer: >60 mL/min (ref >60)
GLUCOSE: 99 mg/dL (ref 70–99)
POTASSIUM: 3.3 mmol/L — AB (ref 3.5–5.1)
Sodium: 139 mmol/L (ref 135–145)

## 2018-01-16 LAB — CBC
HCT: 38.4 % — ABNORMAL LOW (ref 39.0–52.0)
HEMOGLOBIN: 11 g/dL — AB (ref 13.0–17.0)
MCH: 22.9 pg — AB (ref 26.0–34.0)
MCHC: 28.6 g/dL — ABNORMAL LOW (ref 30.0–36.0)
MCV: 79.8 fL — AB (ref 80.0–100.0)
Platelets: 310 10*3/uL (ref 150–400)
RBC: 4.81 MIL/uL (ref 4.22–5.81)
RDW: 14.5 % (ref 11.5–15.5)
WBC: 6.6 10*3/uL (ref 4.0–10.5)
nRBC: 0 % (ref 0.0–0.2)

## 2018-01-16 LAB — TROPONIN I: Troponin I: <0.03 ng/mL (ref <0.03)

## 2018-01-16 LAB — D-DIMER, QUANTITATIVE: D-Dimer, Quant: 0.31 ug/mL-FEU (ref 0.00–0.50)

## 2018-01-16 MED ORDER — ALUM & MAG HYDROXIDE-SIMETH 200-200-20 MG/5ML PO SUSP
30.0000 mL | Freq: Once | ORAL | Status: AC
  Administered 2018-01-16: 30 mL via ORAL
  Filled 2018-01-16: qty 30

## 2018-01-16 NOTE — ED Provider Notes (Signed)
[facility] EMERGENCY DEPARTMENT Provider Note   CSN: [id number] Arrival date & time: 01/16/18  1559     History   Chief Complaint Chief Complaint  Patient presents with  . Chest Pain    HPI Todd Berg is a 68 y.o. male.  HPI Patient states he developed left-sided chest pain this morning upon waking.  Denies cough, fever or chills.  The pain described as sharp and stabbing.  Has improved since onset this morning.  Pain is worse with deep breathing.  No new lower extremity swelling or pain.  No shortness of breath.  Chest pain does not radiate. Past Medical History:  Diagnosis Date  . Cancer ([place]) 2015   rectal  . Chronic back pain   . COPD (chronic obstructive pulmonary disease) ([place])   . GERD (gastroesophageal reflux disease)   . Hematemesis   . Hemorrhoids   . Hyperlipidemia   . Hypotension   . Melena   . Melena 10/12/2017  . Stroke [facility])     Patient Active Problem List   Diagnosis Date Noted  . Syncope 10/25/2017  . Melena 10/12/2017  . Hypokalemia 10/12/2017  . Sinus bradycardia 10/12/2017  . Anemia 10/10/2017  . Fatigue 10/10/2017  . Hypertension 03/21/2017  . Generalized anxiety disorder 03/21/2017  . Degenerative disc disease, lumbar 03/17/2017  . Iron deficiency anemia due to chronic blood loss 11/20/2016  . GI bleed 06/09/2016  . Chronic back pain   . COPD (chronic obstructive pulmonary disease) ([place])   . GERD (gastroesophageal reflux disease)   . Hemorrhoids   . History of rectal cancer 09/13/2015    Past Surgical History:  Procedure Laterality Date  . BACK SURGERY    . BIOPSY  10/13/2017   Procedure: BIOPSY;  Surgeon: Laurence Spates, MD;  Location: [facility] ENDOSCOPY;  Service: Endoscopy;;  . ESOPHAGOGASTRODUODENOSCOPY N/A 06/09/2016   Procedure: ESOPHAGOGASTRODUODENOSCOPY (EGD);  Surgeon: Wilford Corner, MD;  Location: [place];  Service: Endoscopy;  Laterality: N/A;  . ESOPHAGOGASTRODUODENOSCOPY (EGD) WITH PROPOFOL Left 11/21/2016   Procedure: ESOPHAGOGASTRODUODENOSCOPY (EGD) WITH PROPOFOL;  Surgeon: Arta Silence, MD;  Location: [facility] ENDOSCOPY;  Service: Endoscopy;  Laterality: Left;  . ESOPHAGOGASTRODUODENOSCOPY (EGD) WITH PROPOFOL N/A 10/13/2017   Procedure: ESOPHAGOGASTRODUODENOSCOPY (EGD) WITH PROPOFOL;  Surgeon: Laurence Spates, MD;  Location: [place];  Service: Endoscopy;  Laterality: N/A;  . HEMORRHOID SURGERY    . HERNIA REPAIR    . RECTAL SURGERY          Home Medications    Prior to Admission medications   Medication Sig Start Date End Date Taking? Authorizing Provider  amLODipine (NORVASC) 2.5 MG tablet TAKE 1 TABLET DAILY Patient taking differently: Take 2.5 mg by mouth daily.  08/24/17  Yes Dettinger, [name], MD  amoxicillin (AMOXIL) 875 MG tablet Take 875 mg by mouth 2 (two) times daily. 10 day course starting on 01/12/2018   Yes [provider]  fluticasone (FLONASE) 50 MCG/ACT nasal spray Place 1 spray into both nostrils 2 (two) times daily as needed for allergies or rhinitis. 12/09/17  Yes Martin, Mary-Margaret, FNP  pantoprazole (PROTONIX) 40 MG tablet Take 1 tablet (40 mg total) by mouth 2 (two) times daily. 01/13/18  Yes Dettinger, [name], MD  tetrahydrozoline 0.05 % ophthalmic solution Place 1 drop daily as needed into both eyes (itching).   Yes [provider]  tiZANidine (ZANAFLEX) 2 MG tablet Take 1 tablet (2 mg total) by mouth every 6 (six) hours as needed for muscle spasms. 12/20/17  Yes Dettinger,  [name], MD  amoxicillin (AMOXIL) 400 MG/5ML suspension Take 10 mLs (800 mg total) by mouth 2 (two) times daily for 10 days. 01/13/18 01/23/18  Dettinger, [name], MD  Capsaicin-Menthol-Methyl Sal (CAPSAICIN-METHYL SAL-MENTHOL) 0.025-1-12 % CREA Apply to neck for pain Patient not taking: Reported on 01/16/2018 10/27/17   Alma Friendly, MD    Family History Family History  Problem Relation Age of Onset  . COPD Mother   . Stroke Father     Social History Social  History   Tobacco Use  . Smoking status: Former Smoker    Start date: 12/30/1964    Last attempt to quit: 06/19/2009    Years since quitting: 8.5  . Smokeless tobacco: Former Systems developer    Types: Snuff    Quit date: 09/26/2005  Substance Use Topics  . Alcohol use: No  . Drug use: No     Allergies   Patient has no known allergies.   Review of Systems Review of Systems  Constitutional: Negative for chills and fever.  HENT: Negative for sore throat and trouble swallowing.   Eyes: Negative for visual disturbance.  Respiratory: Negative for cough and shortness of breath.   Cardiovascular: Positive for chest pain. Negative for palpitations and leg swelling.  Gastrointestinal: Negative for abdominal pain, constipation, diarrhea, nausea and vomiting.  Genitourinary: Negative for dysuria, flank pain and hematuria.  Musculoskeletal: Negative for back pain, myalgias, neck pain and neck stiffness.  Skin: Negative for rash and wound.  Neurological: Negative for dizziness, weakness, light-headedness, numbness and headaches.  All other systems reviewed and are negative.    Physical Exam Updated Vital Signs BP (!) 159/88   Pulse 64   Temp 97.6 F (36.4 C) (Oral)   Resp 14   Ht 6' (1.829 m)   Wt 89.8 kg   SpO2 100%   BMI 26.85 kg/m   Physical Exam Vitals signs and nursing note reviewed.  Constitutional:      General: He is not in acute distress.    Appearance: Normal appearance. He is well-developed. He is not ill-appearing.  HENT:     Head: Normocephalic and atraumatic.     Nose: Nose normal.     Mouth/Throat:     Mouth: Mucous membranes are moist.  Eyes:     Extraocular Movements: Extraocular movements intact.     Conjunctiva/sclera: Conjunctivae normal.     Pupils: Pupils are equal, round, and reactive to light.  Neck:     Musculoskeletal: Normal range of motion and neck supple. No neck rigidity or muscular tenderness.  Cardiovascular:     Rate and Rhythm: Normal rate and  regular rhythm.     Heart sounds: No murmur. No friction rub. No gallop.   Pulmonary:     Effort: Pulmonary effort is normal. No respiratory distress.     Breath sounds: Normal breath sounds. No stridor. No wheezing, rhonchi or rales.  Chest:     Chest wall: No tenderness.  Abdominal:     General: Bowel sounds are normal. There is no distension.     Palpations: Abdomen is soft.     Tenderness: There is no abdominal tenderness. There is no guarding or rebound.  Musculoskeletal: Normal range of motion.        General: No tenderness.     Comments: No midline thoracic or lumbar tenderness.  No CVA tenderness.  No lower extremity swelling, asymmetry or tenderness.  Distal pulses intact.  Lymphadenopathy:     Cervical: No cervical adenopathy.  Skin:    General: Skin is warm and dry.     Findings: No erythema or rash.  Neurological:     General: No focal deficit present.     Mental Status: He is alert and oriented to person, place, and time.     Comments: Moving all extremities without focal deficit.  Sensation intact.  Psychiatric:        Mood and Affect: Mood normal.        Behavior: Behavior normal.      ED Treatments / Results  Labs (all labs ordered are listed, but only abnormal results are displayed) Labs Reviewed  BASIC METABOLIC PANEL - Abnormal; Notable for the following components:      Result Value   Potassium 3.3 (*)    All other components within normal limits  CBC - Abnormal; Notable for the following components:   Hemoglobin 11.0 (*)    HCT 38.4 (*)    MCV 79.8 (*)    MCH 22.9 (*)    MCHC 28.6 (*)    All other components within normal limits  TROPONIN I  D-DIMER, QUANTITATIVE (NOT AT [facility])    EKG EKG Interpretation  Date/Time:  Monday January 16 2018 16:06:29 EST Ventricular Rate:  68 PR Interval:  160 QRS Duration: 102 QT Interval:  372 QTC Calculation: 395 R Axis:   64 Text Interpretation:  Normal sinus rhythm Nonspecific ST abnormality Abnormal  ECG Confirmed by Julianne Rice [phone]) on 01/16/2018 4:38:12 PM   Radiology Dg Chest 2 View  Result Date: 01/16/2018 CLINICAL DATA:  Chest pain and shortness of breath EXAM: CHEST - 2 VIEW COMPARISON:  06/11/2017 FINDINGS: The heart size and mediastinal contours are within normal limits. Both lungs are clear. The visualized skeletal structures are unremarkable. IMPRESSION: No active cardiopulmonary disease. Electronically Signed   By: Ulyses Jarred M.D.   On: 01/16/2018 16:30    Procedures Procedures (including critical care time)  Medications Ordered in ED Medications  alum & mag hydroxide-simeth (MAALOX/MYLANTA) 200-200-20 MG/5ML suspension 30 mL (30 mLs Oral Given 01/16/18 1757)     Initial Impression / Assessment and Plan / ED Course  I have reviewed the triage vital signs and the nursing notes.  Pertinent labs & imaging results that were available during my care of the patient were reviewed by me and considered in my medical decision making (see chart for details).     Patient states he recently ran out of his Protonix.  Has refill at the pharmacy.  States chest pain is completely resolved after Mylanta.  Troponin is negative.  Single troponin sufficient to rule out acute MI.  Patient also has a negative d-dimer.  Low suspicion chest pain is cardiac in origin.  Advised to start taking back his Protonix and take Mylanta as needed.  Patient does have some risk factors for coronary artery disease and should follow-up with the cardiologist.  Strict return precautions have been given.  Final Clinical Impressions(s) / ED Diagnoses   Final diagnoses:  Pleuritic chest pain    ED Discharge Orders    None       Julianne Rice, MD 01/16/18 [phone]

## 2018-01-16 NOTE — ED Triage Notes (Signed)
CP on lt side of chest since this morning

## 2018-01-16 NOTE — Telephone Encounter (Signed)
Called and spoke with patient.  Patient states that he is having chest pains that is taking his breathe way and overall not feeling well. Per Monia Pouch, FNP patient needs to be seen in ED. Informed patient and patient's wife he will need to go to ED.  Patient and patient's wife verbalized understanding. Appt canceled for after hours  clinic.

## 2018-01-27 ENCOUNTER — Encounter (HOSPITAL_COMMUNITY): Payer: Self-pay

## 2018-01-27 ENCOUNTER — Other Ambulatory Visit: Payer: Self-pay

## 2018-01-27 ENCOUNTER — Emergency Department (HOSPITAL_COMMUNITY): Payer: Medicare Other

## 2018-01-27 ENCOUNTER — Emergency Department (HOSPITAL_COMMUNITY)
Admission: EM | Admit: 2018-01-27 | Discharge: 2018-01-27 | Disposition: A | Discharge status: Home / Self Care | Payer: Medicare Other | Attending: Emergency Medicine | Admitting: Emergency Medicine

## 2018-01-27 DIAGNOSIS — Z79899 Other long term (current) drug therapy: Secondary | ICD-10-CM | POA: Insufficient documentation

## 2018-01-27 DIAGNOSIS — Z8673 Personal history of transient ischemic attack (TIA), and cerebral infarction without residual deficits: Secondary | ICD-10-CM | POA: Insufficient documentation

## 2018-01-27 DIAGNOSIS — Z87891 Personal history of nicotine dependence: Secondary | ICD-10-CM | POA: Insufficient documentation

## 2018-01-27 DIAGNOSIS — Z85048 Personal history of other malignant neoplasm of rectum, rectosigmoid junction, and anus: Secondary | ICD-10-CM | POA: Insufficient documentation

## 2018-01-27 DIAGNOSIS — M549 Dorsalgia, unspecified: Secondary | ICD-10-CM

## 2018-01-27 DIAGNOSIS — F411 Generalized anxiety disorder: Secondary | ICD-10-CM | POA: Insufficient documentation

## 2018-01-27 DIAGNOSIS — I10 Essential (primary) hypertension: Secondary | ICD-10-CM | POA: Insufficient documentation

## 2018-01-27 DIAGNOSIS — J449 Chronic obstructive pulmonary disease, unspecified: Secondary | ICD-10-CM | POA: Diagnosis not present

## 2018-01-27 DIAGNOSIS — M7918 Myalgia, other site: Secondary | ICD-10-CM | POA: Insufficient documentation

## 2018-01-27 LAB — URINALYSIS, ROUTINE W REFLEX MICROSCOPIC
BILIRUBIN URINE: NEGATIVE
Glucose, UA: NEGATIVE mg/dL
Hgb urine dipstick: NEGATIVE
Ketones, ur: NEGATIVE mg/dL
Leukocytes, UA: NEGATIVE
Nitrite: NEGATIVE
Protein, ur: NEGATIVE mg/dL
Specific Gravity, Urine: 1.013 (ref 1.005–1.030)
pH: 7 (ref 5.0–8.0)

## 2018-01-27 MED ORDER — DEXAMETHASONE 4 MG PO TABS: 4.0000 mg | ORAL_TABLET | Freq: Two times a day (BID) | ORAL | 0 refills | Status: DC

## 2018-01-27 MED ORDER — TRAMADOL HCL 50 MG PO TABS: 50.0000 mg | ORAL_TABLET | Freq: Four times a day (QID) | ORAL | 0 refills | Status: DC | PRN

## 2018-01-27 MED ORDER — LORAZEPAM 2 MG/ML IJ SOLN
0.5000 mg | Freq: Once | INTRAMUSCULAR | Status: AC
  Administered 2018-01-27: 0.5 mg via INTRAVENOUS
  Filled 2018-01-27: qty 1

## 2018-01-27 MED ORDER — DEXAMETHASONE SODIUM PHOSPHATE 10 MG/ML IJ SOLN
10.0000 mg | Freq: Once | INTRAMUSCULAR | Status: AC
  Administered 2018-01-27: 10 mg via INTRAMUSCULAR
  Filled 2018-01-27: qty 1

## 2018-01-27 MED ORDER — HYDROCODONE-ACETAMINOPHEN 5-325 MG PO TABS
2.0000 | ORAL_TABLET | Freq: Once | ORAL | Status: AC
  Administered 2018-01-27: 2 via ORAL
  Filled 2018-01-27: qty 2

## 2018-01-27 MED ORDER — ONDANSETRON HCL 4 MG PO TABS
4.0000 mg | ORAL_TABLET | Freq: Once | ORAL | Status: AC
  Administered 2018-01-27: 4 mg via ORAL
  Filled 2018-01-27: qty 1

## 2018-01-27 NOTE — Discharge Instructions (Addendum)
The MRI of your lower back is negative for acute changes or problems.  Your fusion from the previous surgery is intact without problem.  Your examination favors a musculoskeletal cause of your lower back pain.  Please use a heating pad to your lower back.  Use Tylenol extra strength every 4 hours for mild pain.  Use Decadron 2 times daily with food until all taken.  May use Ultram for more severe pain.  This medication may cause drowsiness.  Please use caution changing positions, or walking.  Please do not drive or operate machinery when taking this medication.

## 2018-01-27 NOTE — ED Triage Notes (Addendum)
Pt complaining of back pain. History of chronic back pain. Back surgery with titanium rods. HR 49-50. EMS given 500 mL bolus

## 2018-01-27 NOTE — ED Notes (Signed)
Patient given discharge instruction, verbalized understand. Patient ambulatory out of the department.  

## 2018-01-27 NOTE — ED Notes (Signed)
Patient given discharge instruction, verbalized understand. IV removed, band aid applied. Patient ambulatory out of the department with wife.

## 2018-01-27 NOTE — ED Provider Notes (Signed)
[facility] EMERGENCY DEPARTMENT Provider Note   CSN: [id number] Arrival date & time: 01/27/18  1010     History   Chief Complaint Chief Complaint  Patient presents with  . Back Pain    HPI Todd Berg is a 68 y.o. male.  Patient is a 68 year old male who presents to the emergency department by EMS because of back pain.  The patient and the spouse are the historians.  The spouse states that the patient drove his truck to see a friend.  When the patient came back he could not get out of the truck because of severe back pain.  There was no loss of bowel or bladder function.  She had to get help to get him out of the truck.  She had to get help to get him to the house because of the severe pain.  No radiation of the pain to the legs.  Wife states that he just seemed to be very weak and required additional help to get out of the truck as well as get in the house.  EMS was called to transport the patient to the hospital.  It is of note that the patient has a chronic back pain patient states he is required back surgery with titanium rods.  He has not had any recent falls that he is aware of.  No high fever reported.  No nausea or vomiting reported.  He presents now for assistance with this issue.  The history is provided by the patient.  Back Pain  Associated symptoms: weakness   Associated symptoms: no abdominal pain, no chest pain and no dysuria     Past Medical History:  Diagnosis Date  . Cancer ([place]) 2015   rectal  . Chronic back pain   . COPD (chronic obstructive pulmonary disease) ([place])   . GERD (gastroesophageal reflux disease)   . Hematemesis   . Hemorrhoids   . Hyperlipidemia   . Hypotension   . Melena   . Melena 10/12/2017  . Stroke [facility])     Patient Active Problem List   Diagnosis Date Noted  . Syncope 10/25/2017  . Melena 10/12/2017  . Hypokalemia 10/12/2017  . Sinus bradycardia 10/12/2017  . Anemia 10/10/2017  . Fatigue 10/10/2017  . Hypertension  03/21/2017  . Generalized anxiety disorder 03/21/2017  . Degenerative disc disease, lumbar 03/17/2017  . Iron deficiency anemia due to chronic blood loss 11/20/2016  . GI bleed 06/09/2016  . Chronic back pain   . COPD (chronic obstructive pulmonary disease) ([place])   . GERD (gastroesophageal reflux disease)   . Hemorrhoids   . History of rectal cancer 09/13/2015    Past Surgical History:  Procedure Laterality Date  . BACK SURGERY    . BIOPSY  10/13/2017   Procedure: BIOPSY;  Surgeon: Laurence Spates, MD;  Location: [facility] ENDOSCOPY;  Service: Endoscopy;;  . ESOPHAGOGASTRODUODENOSCOPY N/A 06/09/2016   Procedure: ESOPHAGOGASTRODUODENOSCOPY (EGD);  Surgeon: Wilford Corner, MD;  Location: [place];  Service: Endoscopy;  Laterality: N/A;  . ESOPHAGOGASTRODUODENOSCOPY (EGD) WITH PROPOFOL Left 11/21/2016   Procedure: ESOPHAGOGASTRODUODENOSCOPY (EGD) WITH PROPOFOL;  Surgeon: Arta Silence, MD;  Location: [facility] ENDOSCOPY;  Service: Endoscopy;  Laterality: Left;  . ESOPHAGOGASTRODUODENOSCOPY (EGD) WITH PROPOFOL N/A 10/13/2017   Procedure: ESOPHAGOGASTRODUODENOSCOPY (EGD) WITH PROPOFOL;  Surgeon: Laurence Spates, MD;  Location: [place];  Service: Endoscopy;  Laterality: N/A;  . HEMORRHOID SURGERY    . HERNIA REPAIR    . RECTAL SURGERY  Home Medications    Prior to Admission medications   Medication Sig Start Date End Date Taking? Authorizing Provider  amLODipine (NORVASC) 2.5 MG tablet TAKE 1 TABLET DAILY Patient taking differently: Take 2.5 mg by mouth daily.  08/24/17   Dettinger, [name], MD  amoxicillin (AMOXIL) 875 MG tablet Take 875 mg by mouth 2 (two) times daily. 10 day course starting on 01/12/2018    [provider]  Capsaicin-Menthol-Methyl Sal (CAPSAICIN-METHYL SAL-MENTHOL) 0.025-1-12 % CREA Apply to neck for pain Patient not taking: Reported on 01/16/2018 10/27/17   Alma Friendly, MD  fluticasone [facility]) 50 MCG/ACT nasal spray Place 1 spray into both  nostrils 2 (two) times daily as needed for allergies or rhinitis. 12/09/17   Hassell Done, Mary-Margaret, FNP  pantoprazole (PROTONIX) 40 MG tablet Take 1 tablet (40 mg total) by mouth 2 (two) times daily. 01/13/18   Dettinger, [name], MD  tetrahydrozoline 0.05 % ophthalmic solution Place 1 drop daily as needed into both eyes (itching).    [provider]  tiZANidine (ZANAFLEX) 2 MG tablet Take 1 tablet (2 mg total) by mouth every 6 (six) hours as needed for muscle spasms. 12/20/17   Dettinger, [name], MD    Family History Family History  Problem Relation Age of Onset  . COPD Mother   . Stroke Father     Social History Social History   Tobacco Use  . Smoking status: Former Smoker    Start date: 12/30/1964    Last attempt to quit: 06/19/2009    Years since quitting: 8.6  . Smokeless tobacco: Former Systems developer    Types: Snuff    Quit date: 09/26/2005  Substance Use Topics  . Alcohol use: No  . Drug use: No     Allergies   Patient has no known allergies.   Review of Systems Review of Systems  Constitutional: Negative for activity change.       All ROS Neg except as noted in HPI  HENT: Negative for nosebleeds.   Eyes: Negative for photophobia and discharge.  Respiratory: Negative for cough, shortness of breath and wheezing.   Cardiovascular: Negative for chest pain and palpitations.  Gastrointestinal: Negative for abdominal pain and blood in stool.  Genitourinary: Negative for dysuria, frequency and hematuria.  Musculoskeletal: Positive for back pain. Negative for arthralgias and neck pain.  Skin: Negative.   Neurological: Positive for weakness. Negative for dizziness, seizures and speech difficulty.  Psychiatric/Behavioral: Negative for confusion and hallucinations.     Physical Exam Updated Vital Signs BP 116/68 (BP Location: Left Arm)   Pulse (!) 50   Temp (!) 97.5 F (36.4 C) (Oral)   Resp 16   SpO2 100%   Physical Exam Vitals signs and nursing note reviewed.    Constitutional:      Appearance: He is well-developed. He is not toxic-appearing.  HENT:     Head: Normocephalic.     Right Ear: Tympanic membrane and external ear normal.     Left Ear: Tympanic membrane and external ear normal.  Eyes:     General: Lids are normal.     Pupils: Pupils are equal, round, and reactive to light.  Neck:     Musculoskeletal: Normal range of motion and neck supple.     Vascular: No carotid bruit.  Cardiovascular:     Rate and Rhythm: Regular rhythm. Bradycardia present.     Pulses: Normal pulses.     Heart sounds: Normal heart sounds.  Pulmonary:  Effort: No respiratory distress.     Breath sounds: Normal breath sounds.  Abdominal:     General: Bowel sounds are normal.     Palpations: Abdomen is soft.     Tenderness: There is no abdominal tenderness. There is no guarding.  Musculoskeletal: Normal range of motion.  Lymphadenopathy:     Head:     Right side of head: No submandibular adenopathy.     Left side of head: No submandibular adenopathy.     Cervical: No cervical adenopathy.  Skin:    General: Skin is warm and dry.  Neurological:     Mental Status: He is alert and oriented to person, place, and time.     Cranial Nerves: No cranial nerve deficit.     Sensory: No sensory deficit.     Comments: Patient is awake and alert and oriented to person place and situation.  Grip is symmetrical.  Unable to fully evaluate the lower extremities because of pain during the examination.  No sensory deficits appreciated of the lower extremities.  No evidence of any numbness in the saddle area.  Psychiatric:        Speech: Speech normal.      ED Treatments / Results  Labs (all labs ordered are listed, but only abnormal results are displayed) Labs Reviewed - No data to display  EKG None  Radiology No results found.  Procedures Procedures (including critical care time)  Medications Ordered in ED Medications - No data to display   Initial  Impression / Assessment and Plan / ED Course  I have reviewed the triage vital signs and the nursing notes.  Pertinent labs & imaging results that were available during my care of the patient were reviewed by me and considered in my medical decision making (see chart for details).       Final Clinical Impressions(s) / ED Diagnoses MDM Patient has a bradycardia present, but this is not new for the patient. Blood pressure remained stable.  Patient awake and alert oriented to person place and situation.  No gross neurologic deficits appreciated at this time.  The patient and spouse state that the patient had weakness attempting to get out of the truck today.  He had severe back pain.  Will obtain MRI.  There is no abdominal mass or pulsating mass.  No complaint of a tearing sensation, or worse pain of my life.  The pulses are symmetrical.  Doubt aneurysmal pain.  Urine analysis is within normal limits.  No evidence of urinary tract infection or kidney stone. MRI was read as no significant abnormality of the lumbar spine.  The previous fusion area appears to be within normal limits with no impingement appreciated.  Patient feeling much better after pain medications.  I suspect that the patient had muscle spasm or inflammatory attack earlier before arriving in the emergency department.  But no acute or surgical changes noted at this time.  Patient is ambulatory at the time of discharge, and wife states that he is pretty much at his baseline.   Final diagnoses:  Musculoskeletal back pain    ED Discharge Orders         Ordered    dexamethasone (DECADRON) 4 MG tablet  2 times daily with meals     01/27/18 1345    traMADol (ULTRAM) 50 MG tablet  Every 6 hours PRN     01/27/18 1345           Lily Kocher, PA-C 01/27/18 1452  Francine Graven, DO 01/28/18 [phone]

## 2018-03-10 ENCOUNTER — Encounter: Payer: Self-pay | Admitting: Family Medicine

## 2018-03-10 ENCOUNTER — Encounter (INDEPENDENT_AMBULATORY_CARE_PROVIDER_SITE_OTHER): Payer: Self-pay

## 2018-03-10 ENCOUNTER — Ambulatory Visit (INDEPENDENT_AMBULATORY_CARE_PROVIDER_SITE_OTHER): Payer: Medicare Other | Admitting: Family Medicine

## 2018-03-10 VITALS — BP 146/75 | HR 57 | Temp 97.1°F | Ht 73.0 in | Wt 201.6 lb

## 2018-03-10 DIAGNOSIS — B078 Other viral warts: Secondary | ICD-10-CM | POA: Diagnosis not present

## 2018-03-10 NOTE — Progress Notes (Signed)
BP (!) 146/75   Pulse (!) 57   Temp (!) 97.1 F (36.2 C) (Oral)   Ht 6\' 1"  (1.854 m)   Wt 91.4 kg   BMI 26.60 kg/m    Subjective:    Patient ID: Todd Berg, male    DOB: July 16, 1950, 68 y.o.   MRN: [id number]  HPI: Todd Berg is a 68 y.o. male presenting on 03/10/2018 for spot on right thumb (Patient states that it has been there for awhile and it is painful at tinmes)   HPI Pt is a 68 y/o male, presenting for a 6 month history of a raised lump on the base of his right thumb. Pt states it came up gradually and has kept getting larger.  He states it is painful when he presses on it, and the pain is sharp if he bumps it while working around the house.  He has tried to file it down, but it did not go away.  Pt denies redness, swelling, and itching.  He says the pain is sharp but not severe and is actually very mild and more of an irritant  Relevant past medical, surgical, family and social history reviewed and updated as indicated. Interim medical history since our last visit reviewed. Allergies and medications reviewed and updated.  Review of Systems  Constitutional: Negative for chills and fever.  Musculoskeletal: Negative for joint swelling and myalgias.  Skin:       Positive for raised lump on right thumb     Per HPI unless specifically indicated above     Objective:    BP (!) 146/75   Pulse (!) 57   Temp (!) 97.1 F (36.2 C) (Oral)   Ht 6\' 1"  (1.854 m)   Wt 91.4 kg   BMI 26.60 kg/m   Wt Readings from Last 3 Encounters:  03/10/18 91.4 kg  01/16/18 89.8 kg  01/13/18 89.8 kg    Physical Exam Constitutional:      General: He is not in acute distress.    Appearance: Normal appearance.  Musculoskeletal:     Right wrist: He exhibits normal range of motion and no swelling.  Skin:      Neurological:     Mental Status: He is alert.        Assessment & Plan:   Problem List Items Addressed This Visit    None    Visit Diagnoses    Common wart    -   Primary     Common wart cryotherapy: Verbal consent was obtained.  Liquid Nitrogen spray gun was used to freeze wart on pt's right thumb, located at the 1st metacarpal-phalangeal joint.  Freezing was performed in three, 10-second bursts, until whitened, frozen area encompassed just beyond diameter of the lesion. Pt was informed that the wart may blister after the procedure. He was instructed that he may need to return for a second freezing if the lesion does not go away after the first treatment.  Patient tolerated well, no bleeding.  Follow up plan: Return if symptoms worsen or do not go away.   Counseling provided for all of the vaccine components No orders of the defined types were placed in this encounter.   Johnney Killian PA-S [facility]  I was personally present for all components of the history, physical exam and/or medical decision making.  I agree with the documentation performed by the PA student and agree with assessment and plan above.  PA student was Johnney Killian. Vonna Kotyk  Dettinger, MD [facility] 03/10/2018, 1:08 PM

## 2018-03-15 ENCOUNTER — Ambulatory Visit (INDEPENDENT_AMBULATORY_CARE_PROVIDER_SITE_OTHER): Payer: Medicare Other | Admitting: Physician Assistant

## 2018-03-15 ENCOUNTER — Encounter: Payer: Self-pay | Admitting: Physician Assistant

## 2018-03-15 VITALS — BP 140/78 | HR 68 | Temp 96.6°F | Ht 73.0 in | Wt 203.8 lb

## 2018-03-15 DIAGNOSIS — L03011 Cellulitis of right finger: Secondary | ICD-10-CM | POA: Diagnosis not present

## 2018-03-15 MED ORDER — CEPHALEXIN 250 MG PO CAPS: 250.0000 mg | ORAL_CAPSULE | Freq: Three times a day (TID) | ORAL | 0 refills | Status: DC

## 2018-03-16 NOTE — Progress Notes (Signed)
BP 140/78   Pulse 68   Temp (!) 96.6 F (35.9 C) (Oral)   Ht 6\' 1"  (1.854 m)   Wt 203 lb 12.8 oz (92.4 kg)   BMI 26.89 kg/m    Subjective:    Patient ID: Todd Berg, male    DOB: 1950-07-16, 68 y.o.   MRN: [id number]  HPI: Todd Berg is a 68 y.o. male presenting on 03/15/2018 for infected thumb  He had a wart treated with cryo about 10 days ago and in the past 3 days has had increased redness and slight drainage.  He denies fever or chills. No blisters or bleeding.  Past Medical History:  Diagnosis Date  . Cancer ([place]) 2015   rectal  . Chronic back pain   . COPD (chronic obstructive pulmonary disease) ([place])   . GERD (gastroesophageal reflux disease)   . Hematemesis   . Hemorrhoids   . Hyperlipidemia   . Hypotension   . Melena   . Melena 10/12/2017  . Stroke [facility])    Relevant past medical, surgical, family and social history reviewed and updated as indicated. Interim medical history since our last visit reviewed. Allergies and medications reviewed and updated. DATA REVIEWED: CHART IN EPIC  Family History reviewed for pertinent findings.  Review of Systems  Constitutional: Negative.  Negative for appetite change and fatigue.  Eyes: Negative for pain and visual disturbance.  Respiratory: Negative.  Negative for cough, chest tightness, shortness of breath and wheezing.   Cardiovascular: Negative.  Negative for chest pain, palpitations and leg swelling.  Gastrointestinal: Negative.  Negative for abdominal pain, diarrhea, nausea and vomiting.  Genitourinary: Negative.   Skin: Positive for color change and wound. Negative for rash.  Neurological: Negative.  Negative for weakness, numbness and headaches.  Psychiatric/Behavioral: Negative.     Allergies as of 03/15/2018   No Known Allergies     Medication List       Accurate as of March 15, 2018 11:59 PM. Always use your most recent med list.        amLODipine 2.5 MG tablet Commonly known as:   NORVASC TAKE 1 TABLET DAILY   cephALEXin 250 MG capsule Commonly known as:  Keflex Take 1 capsule (250 mg total) by mouth 3 (three) times daily.   dexamethasone 4 MG tablet Commonly known as:  DECADRON Take 1 tablet (4 mg total) by mouth 2 (two) times daily with a meal.   fluticasone 50 MCG/ACT nasal spray Commonly known as:  FLONASE Place 1 spray into both nostrils 2 (two) times daily as needed for allergies or rhinitis.   pantoprazole 40 MG tablet Commonly known as:  PROTONIX Take 1 tablet (40 mg total) by mouth 2 (two) times daily.   tetrahydrozoline 0.05 % ophthalmic solution Place 1 drop into both eyes every other day.   tiZANidine 2 MG tablet Commonly known as:  ZANAFLEX Take 1 tablet (2 mg total) by mouth every 6 (six) hours as needed for muscle spasms.          Objective:    BP 140/78   Pulse 68   Temp (!) 96.6 F (35.9 C) (Oral)   Ht 6\' 1"  (1.854 m)   Wt 203 lb 12.8 oz (92.4 kg)   BMI 26.89 kg/m   No Known Allergies  Wt Readings from Last 3 Encounters:  03/15/18 203 lb 12.8 oz (92.4 kg)  03/10/18 201 lb 9.6 oz (91.4 kg)  01/16/18 198 lb (89.8 kg)    Physical  Exam Vitals signs and nursing note reviewed.  Constitutional:      General: He is not in acute distress.    Appearance: He is well-developed.  HENT:     Head: Normocephalic and atraumatic.  Cardiovascular:     Rate and Rhythm: Normal rate.  Pulmonary:     Effort: Pulmonary effort is normal. No respiratory distress.     Breath sounds: Normal breath sounds.  Skin:    General: Skin is warm and dry.     Findings: Erythema and wound present.          Results for orders placed or performed during the hospital encounter of 01/27/18  Urinalysis, Routine w reflex microscopic  Result Value Ref Range   Color, Urine YELLOW YELLOW   APPearance CLEAR CLEAR   Specific Gravity, Urine 1.013 1.005 - 1.030   pH 7.0 5.0 - 8.0   Glucose, UA NEGATIVE NEGATIVE mg/dL   Hgb urine dipstick NEGATIVE  NEGATIVE   Bilirubin Urine NEGATIVE NEGATIVE   Ketones, ur NEGATIVE NEGATIVE mg/dL   Protein, ur NEGATIVE NEGATIVE mg/dL   Nitrite NEGATIVE NEGATIVE   Leukocytes, UA NEGATIVE NEGATIVE      Assessment & Plan:   1. Cellulitis of finger of right hand - cephALEXin (KEFLEX) 250 MG capsule; Take 1 capsule (250 mg total) by mouth 3 (three) times daily.  Dispense: 30 capsule; Refill: 0   Continue all other maintenance medications as listed above.  Follow up plan: No follow-ups on file.  Educational handout given for [employer] PA-C [place] [address]  [place], [place] [address] [phone]   03/16/2018, 9:04 PM

## 2018-03-31 ENCOUNTER — Other Ambulatory Visit: Payer: Self-pay

## 2018-03-31 ENCOUNTER — Encounter: Payer: Self-pay | Admitting: Family Medicine

## 2018-03-31 ENCOUNTER — Ambulatory Visit (INDEPENDENT_AMBULATORY_CARE_PROVIDER_SITE_OTHER): Payer: Medicare Other | Admitting: Family Medicine

## 2018-03-31 VITALS — BP 141/85 | HR 57 | Temp 96.7°F | Ht 73.0 in | Wt 202.4 lb

## 2018-03-31 DIAGNOSIS — L03011 Cellulitis of right finger: Secondary | ICD-10-CM

## 2018-03-31 MED ORDER — SULFAMETHOXAZOLE-TRIMETHOPRIM 800-160 MG PO TABS: 1.0000 | ORAL_TABLET | Freq: Two times a day (BID) | ORAL | 0 refills | Status: DC

## 2018-03-31 NOTE — Progress Notes (Signed)
BP (!) 141/85   Pulse (!) 57   Temp (!) 96.7 F (35.9 C) (Oral)   Ht 6\' 1"  (1.854 m)   Wt 202 lb 6.4 oz (91.8 kg)   BMI 26.70 kg/m    Subjective:   Patient ID: Todd Berg, male    DOB: 1950-03-22, 68 y.o.   MRN: [id number]  HPI: Todd Berg is a 68 y.o. male presenting on 03/31/2018 for re check right thumb (x 3 weeks)   HPI Right thumb cellulitis recheck Patient is coming in for right thumb cellulitis.  He says is improved slightly with the Keflex although he still taking it so seems like he maybe has not taking it as he should but has not resolved.  Patient says it did improve with it but it just is still bothering him and he still has pain there.  He has swelling as well.  Relevant past medical, surgical, family and social history reviewed and updated as indicated. Interim medical history since our last visit reviewed. Allergies and medications reviewed and updated.  Review of Systems  Constitutional: Negative for chills and fever.  Respiratory: Negative for shortness of breath and wheezing.   Cardiovascular: Negative for chest pain and leg swelling.  Musculoskeletal: Negative for back pain and gait problem.  Skin: Positive for color change. Negative for rash.  All other systems reviewed and are negative.   Per HPI unless specifically indicated above   Allergies as of 03/31/2018   No Known Allergies     Medication List       Accurate as of March 31, 2018  9:12 AM. Always use your most recent med list.        amLODipine 2.5 MG tablet Commonly known as:  NORVASC TAKE 1 TABLET DAILY   cephALEXin 250 MG capsule Commonly known as:  Keflex Take 1 capsule (250 mg total) by mouth 3 (three) times daily.   dexamethasone 4 MG tablet Commonly known as:  DECADRON Take 1 tablet (4 mg total) by mouth 2 (two) times daily with a meal.   fluticasone 50 MCG/ACT nasal spray Commonly known as:  FLONASE Place 1 spray into both nostrils 2 (two) times daily as needed  for allergies or rhinitis.   pantoprazole 40 MG tablet Commonly known as:  PROTONIX Take 1 tablet (40 mg total) by mouth 2 (two) times daily.   sulfamethoxazole-trimethoprim 800-160 MG tablet Commonly known as:  BACTRIM DS,SEPTRA DS Take 1 tablet by mouth 2 (two) times daily.   tetrahydrozoline 0.05 % ophthalmic solution Place 1 drop into both eyes every other day.   tiZANidine 2 MG tablet Commonly known as:  ZANAFLEX Take 1 tablet (2 mg total) by mouth every 6 (six) hours as needed for muscle spasms.        Objective:   BP (!) 141/85   Pulse (!) 57   Temp (!) 96.7 F (35.9 C) (Oral)   Ht 6\' 1"  (1.854 m)   Wt 202 lb 6.4 oz (91.8 kg)   BMI 26.70 kg/m   Wt Readings from Last 3 Encounters:  03/31/18 202 lb 6.4 oz (91.8 kg)  03/15/18 203 lb 12.8 oz (92.4 kg)  03/10/18 201 lb 9.6 oz (91.4 kg)    Physical Exam Vitals signs and nursing note reviewed.  Constitutional:      General: He is not in acute distress.    Appearance: He is well-developed. He is not diaphoretic.  Eyes:     General: No scleral icterus.  Conjunctiva/sclera: Conjunctivae normal.  Neck:     Musculoskeletal: Neck supple.     Thyroid: No thyromegaly.  Lymphadenopathy:     Cervical: No cervical adenopathy.  Skin:    General: Skin is warm and dry.     Findings: Erythema (Lateral right thumb erythema, no induration or fluctuation.  Dime sized area) present. No rash.  Neurological:     Mental Status: He is alert and oriented to person, place, and time.     Coordination: Coordination normal.  Psychiatric:        Behavior: Behavior normal.     Assessment & Plan:   Problem List Items Addressed This Visit    None    Visit Diagnoses    Cellulitis of finger of right hand    -  Primary   Relevant Medications   sulfamethoxazole-trimethoprim (BACTRIM DS,SEPTRA DS) 800-160 MG tablet       Follow up plan: Return if symptoms worsen or fail to improve.  Counseling provided for all of the vaccine  components No orders of the defined types were placed in this encounter.   Caryl Pina, MD [facility] 03/31/2018, 9:12 AM

## 2018-05-03 ENCOUNTER — Other Ambulatory Visit: Payer: Self-pay | Admitting: Family Medicine

## 2018-05-03 DIAGNOSIS — I10 Essential (primary) hypertension: Secondary | ICD-10-CM

## 2018-05-04 ENCOUNTER — Encounter: Payer: Self-pay | Admitting: Family Medicine

## 2018-05-04 ENCOUNTER — Ambulatory Visit (INDEPENDENT_AMBULATORY_CARE_PROVIDER_SITE_OTHER): Payer: Medicare Other | Admitting: Family Medicine

## 2018-05-04 ENCOUNTER — Other Ambulatory Visit: Payer: Self-pay

## 2018-05-04 DIAGNOSIS — K219 Gastro-esophageal reflux disease without esophagitis: Secondary | ICD-10-CM

## 2018-05-04 DIAGNOSIS — J410 Simple chronic bronchitis: Secondary | ICD-10-CM

## 2018-05-04 DIAGNOSIS — I10 Essential (primary) hypertension: Secondary | ICD-10-CM

## 2018-05-04 MED ORDER — PANTOPRAZOLE SODIUM 40 MG PO TBEC: 40.0000 mg | DELAYED_RELEASE_TABLET | Freq: Every day | ORAL | 3 refills | Status: DC

## 2018-05-04 MED ORDER — AMLODIPINE BESYLATE 2.5 MG PO TABS: 2.5000 mg | ORAL_TABLET | Freq: Every day | ORAL | 3 refills | Status: DC

## 2018-05-04 NOTE — Progress Notes (Signed)
Virtual Visit via telephone Note  I connected with Todd Berg on 05/04/18 at [facility] by telephone and verified that I am speaking with the correct person using two identifiers. Todd Berg is currently located at home and wife are currently with her during visit. The provider, [name] , MD is located in their office at time of visit.  Call ended at [phone]  I discussed the limitations, risks, security and privacy concerns of performing an evaluation and management service by telephone and the availability of in person appointments. I also discussed with the patient that there may be a patient responsible charge related to this service. The patient expressed understanding and agreed to proceed.   History and Present Illness: Hypertension Patient is currently on amlodipine, and their blood pressure today is unknown because they don't have home bp cuff.  Patient had one episode of lightheadedness couple weeks ago but has not recurred since, he does complain of a headache today and has been getting headaches more consistently, they are frontal and feel like a sinus headache.. Denies any side effects from medication and is content with current medication.   GERD Patient is currently on protonix.  She denies any major symptoms or abdominal pain or belching or burping. She denies any blood in her stool or lightheadedness or dizziness.   COPD Patient is coming in for COPD recheck today.  He is currently on no medication.  He has a mild chronic cough but denies any major coughing spells or wheezing spells.  He has 0nighttime symptoms per week and 0daytime symptoms per week currently.   No diagnosis found.  Outpatient Encounter Medications as of 05/04/2018  Medication Sig  . amLODipine (NORVASC) 2.5 MG tablet TAKE 1 TABLET DAILY  . cephALEXin (KEFLEX) 250 MG capsule Take 1 capsule (250 mg total) by mouth 3 (three) times daily.  Marland Kitchen dexamethasone (DECADRON) 4 MG tablet Take 1 tablet (4 mg  total) by mouth 2 (two) times daily with a meal.  . fluticasone (FLONASE) 50 MCG/ACT nasal spray Place 1 spray into both nostrils 2 (two) times daily as needed for allergies or rhinitis.  . pantoprazole (PROTONIX) 40 MG tablet Take 1 tablet (40 mg total) by mouth 2 (two) times daily. (Patient taking differently: Take 40 mg by mouth daily. )  . sulfamethoxazole-trimethoprim (BACTRIM DS,SEPTRA DS) 800-160 MG tablet Take 1 tablet by mouth 2 (two) times daily.  Marland Kitchen tetrahydrozoline 0.05 % ophthalmic solution Place 1 drop into both eyes every other day.   Marland Kitchen tiZANidine (ZANAFLEX) 2 MG tablet Take 1 tablet (2 mg total) by mouth every 6 (six) hours as needed for muscle spasms.   No facility-administered encounter medications on file as of 05/04/2018.     Review of Systems  Constitutional: Negative for chills and fever.  Eyes: Negative for visual disturbance.  Respiratory: Negative for shortness of breath and wheezing.   Cardiovascular: Negative for chest pain and leg swelling.  Musculoskeletal: Negative for back pain and gait problem.  Skin: Negative for rash.  Neurological: Positive for light-headedness.  All other systems reviewed and are negative.   Observations/Objective: Patient sounds comfortable and in no acute distress, his wife is on the phone for the most part  Assessment and Plan: Problem List Items Addressed This Visit      Cardiovascular and Mediastinum   Hypertension - Primary   Relevant Medications   amLODipine (NORVASC) 2.5 MG tablet     Respiratory   COPD (chronic obstructive pulmonary disease) ([place])  Digestive   GERD (gastroesophageal reflux disease)   Relevant Medications   pantoprazole (PROTONIX) 40 MG tablet       Follow Up Instructions:  Follow up in 3 months for hypertension and anemia  Patient denies any rectal bleeding, continue Protonix and he will get back on amlodipine because he is been out of it for couple weeks now.   I discussed the assessment  and treatment plan with the patient. The patient was provided an opportunity to ask questions and all were answered. The patient agreed with the plan and demonstrated an understanding of the instructions.   The patient was advised to call back or seek an in-person evaluation if the symptoms worsen or if the condition fails to improve as anticipated.  The above assessment and management plan was discussed with the patient. The patient verbalized understanding of and has agreed to the management plan. Patient is aware to call the clinic if symptoms persist or worsen. Patient is aware when to return to the clinic for a follow-up visit. Patient educated on when it is appropriate to go to the emergency department.    I provided 12 minutes of non-face-to-face time during this encounter.    Worthy Rancher, MD

## 2018-05-10 ENCOUNTER — Other Ambulatory Visit: Payer: Self-pay

## 2018-05-10 ENCOUNTER — Encounter: Payer: Self-pay | Admitting: Family Medicine

## 2018-05-10 ENCOUNTER — Ambulatory Visit (INDEPENDENT_AMBULATORY_CARE_PROVIDER_SITE_OTHER): Payer: Medicare Other | Admitting: Family Medicine

## 2018-05-10 DIAGNOSIS — L247 Irritant contact dermatitis due to plants, except food: Secondary | ICD-10-CM

## 2018-05-10 MED ORDER — PREDNISONE 10 MG PO TABS: ORAL_TABLET | ORAL | 0 refills | Status: DC

## 2018-05-10 NOTE — Progress Notes (Signed)
    Subjective:    Patient ID: Todd Berg, male    DOB: 04-Sep-1950, 68 y.o.   MRN: [id number]   HPI: Todd Berg is a 68 y.o. male presenting for Itching all over. Using calamine. No relief. Onset 6-7 days. Looks like poison oak. No video capability, but he's had it before and this looks the same.HE has been out   Depression screen [facility] 2/9 03/15/2018 03/10/2018 12/20/2017 12/09/2017 11/17/2017  Decreased Interest 0 0 0 0 0  Down, Depressed, Hopeless 0 0 0 0 0  PHQ - 2 Score 0 0 0 0 0     Relevant past medical, surgical, family and social history reviewed and updated as indicated.  Interim medical history since our last visit reviewed. Allergies and medications reviewed and updated.  ROS:  Review of Systems  HENT: Negative for trouble swallowing.   Respiratory: Negative for shortness of breath and wheezing.   Cardiovascular: Negative for palpitations and leg swelling.     Social History   Tobacco Use  Smoking Status Former Smoker  . Start date: 12/30/1964  . Last attempt to quit: 06/19/2009  . Years since quitting: 8.8  Smokeless Tobacco Former Systems developer  . Types: Snuff  . Quit date: 09/26/2005       Objective:     Wt Readings from Last 3 Encounters:  03/31/18 202 lb 6.4 oz (91.8 kg)  03/15/18 203 lb 12.8 oz (92.4 kg)  03/10/18 201 lb 9.6 oz (91.4 kg)     Exam deferred. Pt. Harboring due to COVID 19. Phone visit performed.   Assessment & Plan:   1. Irritant contact dermatitis due to plants, except food     Meds ordered this encounter  Medications  . predniSONE (DELTASONE) 10 MG tablet    Sig: Take 5 daily for 2 days followed by 4,3,2 and 1 for 2 days each.    Dispense:  30 tablet    Refill:  0        Diagnoses and all orders for this visit:  Irritant contact dermatitis due to plants, except food  Other orders -     predniSONE (DELTASONE) 10 MG tablet; Take 5 daily for 2 days followed by 4,3,2 and 1 for 2 days each.    Virtual Visit via  telephone Note  I discussed the limitations, risks, security and privacy concerns of performing an evaluation and management service by telephone and the availability of in person appointments. The patient was identified with two identifiers. Pt.expressed understanding and agreed to proceed. Pt. Is at home. Dr. Livia Snellen is in his office.  Follow Up Instructions:   I discussed the assessment and treatment plan with the patient. The patient was provided an opportunity to ask questions and all were answered. The patient agreed with the plan and demonstrated an understanding of the instructions.   The patient was advised to call back or seek an in-person evaluation if the symptoms worsen or if the condition fails to improve as anticipated.  Visit started: 5:01 Call ended:  5:05 Total minutes including chart review and phone contact time: 9   Follow up plan: Return if symptoms worsen or fail to improve.  Todd Fraise, MD [place]

## 2018-06-26 ENCOUNTER — Encounter: Payer: Self-pay | Admitting: Family Medicine

## 2018-06-26 ENCOUNTER — Ambulatory Visit (INDEPENDENT_AMBULATORY_CARE_PROVIDER_SITE_OTHER): Payer: Medicare Other | Admitting: Family Medicine

## 2018-06-26 ENCOUNTER — Other Ambulatory Visit: Payer: Self-pay

## 2018-06-26 DIAGNOSIS — R200 Anesthesia of skin: Secondary | ICD-10-CM

## 2018-06-26 DIAGNOSIS — R202 Paresthesia of skin: Secondary | ICD-10-CM | POA: Diagnosis not present

## 2018-06-26 NOTE — Progress Notes (Signed)
Subjective:    Patient ID: Todd Berg, male    DOB: [birth year], 68 y.o.   MRN: [id number]   HPI: Todd Berg is a 68 y.o. male presenting for chills for awhile., weak. Legs and feet have no feeling all the time. He can walk as far as he wants to go. No swelling in feet, legs. No dyspnea.  Denies fever. No cough.  Patient is not a particularly good historian.  He answers many of the questions by repeating his previous answer to a different question.  He says his wife will be home soon.  Unfortunately she did not arrive during the timeframe of the visit.  He does not have any acute changes in his condition.  Depression screen [facility] 2/9 03/15/2018 03/10/2018 12/20/2017 12/09/2017 11/17/2017  Decreased Interest 0 0 0 0 0  Down, Depressed, Hopeless 0 0 0 0 0  PHQ - 2 Score 0 0 0 0 0     Relevant past medical, surgical, family and social history reviewed and updated as indicated.  Interim medical history since our last visit reviewed. Allergies and medications reviewed and updated.  ROS:  Review of Systems  Constitutional: Negative for fever.  Respiratory: Negative for shortness of breath.   Cardiovascular: Negative for chest pain.  Musculoskeletal: Negative for arthralgias.  Skin: Negative for rash.  Neurological: Positive for weakness and numbness. Negative for tremors and syncope.  Psychiatric/Behavioral: The patient is not nervous/anxious.      Social History   Tobacco Use  Smoking Status Former Smoker  . Start date: 12/30/1964  . Quit date: 06/19/2009  . Years since quitting: 9.0  Smokeless Tobacco Former Systems developer  . Types: Snuff  . Quit date: 09/26/2005       Objective:     Wt Readings from Last 3 Encounters:  03/31/18 202 lb 6.4 oz (91.8 kg)  03/15/18 203 lb 12.8 oz (92.4 kg)  03/10/18 201 lb 9.6 oz (91.4 kg)     Exam deferred. Pt. Harboring due to COVID 19. Phone visit performed.   Assessment & Plan:   1. Numbness and tingling of both legs     No orders  of the defined types were placed in this encounter.   No orders of the defined types were placed in this encounter.     Diagnoses and all orders for this visit:  Numbness and tingling of both legs    Virtual Visit via telephone Note  I discussed the limitations, risks, security and privacy concerns of performing an evaluation and management service by telephone and the availability of in person appointments. The patient was identified with two identifiers. Pt.expressed understanding and agreed to proceed. Pt. Is at home. Dr. Livia Snellen is in his office.  Follow Up Instructions:   I discussed the assessment and treatment plan with the patient. The patient was provided an opportunity to ask questions and all were answered. The patient agreed with the plan and demonstrated an understanding of the instructions.   The patient was advised to call back or seek an in-person evaluation if the symptoms worsen or if the condition fails to improve as anticipated.   Total minutes including chart review and phone contact time: 20  PT. Has chronic changes in legs that sound like neuropathy. This may be related to past cancer treatment. He was referred back to his PCP for in-person office visit to determine appropriate work up. His symptoms are nonacute per his history.   Follow up plan: 1-2 weeks  with Dr. Warrick Parisian.  Claretta Fraise, MD [place]

## 2018-06-27 ENCOUNTER — Telehealth: Payer: Self-pay | Admitting: Family Medicine

## 2018-06-27 NOTE — Telephone Encounter (Signed)
Pt is having worsening weakness and fatigue Pt was anemic in 01/2018 appt scheduled with PCP

## 2018-06-28 ENCOUNTER — Encounter: Payer: Self-pay | Admitting: Family Medicine

## 2018-06-28 ENCOUNTER — Ambulatory Visit (INDEPENDENT_AMBULATORY_CARE_PROVIDER_SITE_OTHER): Payer: Medicare Other | Admitting: Family Medicine

## 2018-06-28 ENCOUNTER — Other Ambulatory Visit: Payer: Self-pay

## 2018-06-28 VITALS — BP 137/79 | HR 68 | Temp 97.9°F | Ht 73.0 in | Wt 209.0 lb

## 2018-06-28 DIAGNOSIS — R6889 Other general symptoms and signs: Secondary | ICD-10-CM | POA: Diagnosis not present

## 2018-06-28 DIAGNOSIS — G6289 Other specified polyneuropathies: Secondary | ICD-10-CM | POA: Diagnosis not present

## 2018-06-28 DIAGNOSIS — R5383 Other fatigue: Secondary | ICD-10-CM

## 2018-06-28 NOTE — Progress Notes (Signed)
BP 137/79   Pulse 68   Temp 97.9 F (36.6 C) (Oral)   Ht _0  (1.854 m)   Wt 209 lb (94.8 kg)   BMI 27.57 kg/m    Subjective:   Patient ID: Todd Berg, male    DOB: [birth year], 68 y.o.   MRN: [id number]  HPI: Todd Berg is a 68 y.o. male presenting on 06/28/2018 for Fatigue (Patient states it has been on going but has been on and off)   HPI Patient has been having fatigue that is been increasing on and off over the past few weeks.  He does have a history of multiple GI bleeds secondary to aspirin and he has been taking the Protonix and he denies having any bleeding but his energy is just down again.  He denies any chest pain or palpitations or trouble breathing.  He does get a little tingling in both of his legs and numbness likely from his history of spinal surgeries and a plate in his back.  He says is not bad enough to want to go see the surgeon again at this point.  Relevant past medical, surgical, family and social history reviewed and updated as indicated. Interim medical history since our last visit reviewed. Allergies and medications reviewed and updated.  Review of Systems  Constitutional: Positive for fatigue. Negative for chills and fever.  Respiratory: Negative for shortness of breath and wheezing.   Cardiovascular: Negative for chest pain and leg swelling.  Gastrointestinal: Negative for abdominal pain, anal bleeding, blood in stool, constipation and diarrhea.  Musculoskeletal: Negative for back pain and gait problem.  Skin: Negative for rash.  Neurological: Positive for numbness. Negative for weakness and light-headedness.  All other systems reviewed and are negative.   Per HPI unless specifically indicated above   Allergies as of 06/28/2018   No Known Allergies     Medication List       Accurate as of June 28, 2018 11:25 AM. If you have any questions, ask your nurse or doctor.        amLODipine 2.5 MG tablet Commonly known as: NORVASC Take  1 tablet (2.5 mg total) by mouth daily.   fluticasone 50 MCG/ACT nasal spray Commonly known as: FLONASE Place 1 spray into both nostrils 2 (two) times daily as needed for allergies or rhinitis.   pantoprazole 40 MG tablet Commonly known as: PROTONIX Take 1 tablet (40 mg total) by mouth daily.   tetrahydrozoline 0.05 % ophthalmic solution Place 1 drop into both eyes every other day.        Objective:   BP 137/79   Pulse 68   Temp 97.9 F (36.6 C) (Oral)   Ht _1  (1.854 m)   Wt 209 lb (94.8 kg)   BMI 27.57 kg/m   Wt Readings from Last 3 Encounters:  06/28/18 209 lb (94.8 kg)  03/31/18 202 lb 6.4 oz (91.8 kg)  03/15/18 203 lb 12.8 oz (92.4 kg)    Physical Exam Vitals signs and nursing note reviewed.  Constitutional:      General: He is not in acute distress.    Appearance: He is well-developed. He is not diaphoretic.  Eyes:     General: No scleral icterus.    Conjunctiva/sclera: Conjunctivae normal.  Neck:     Musculoskeletal: Neck supple.     Thyroid: No thyromegaly.  Cardiovascular:     Rate and Rhythm: Normal rate and regular rhythm.     Heart sounds: Normal heart  sounds. No murmur.  Pulmonary:     Effort: Pulmonary effort is normal. No respiratory distress.     Breath sounds: Normal breath sounds. No wheezing.  Abdominal:     General: Abdomen is flat. Bowel sounds are normal. There is no distension.     Tenderness: There is no abdominal tenderness. There is no guarding or rebound.  Musculoskeletal: Normal range of motion.  Lymphadenopathy:     Cervical: No cervical adenopathy.  Skin:    General: Skin is warm and dry.     Findings: No rash.  Neurological:     Mental Status: He is alert and oriented to person, place, and time.     Coordination: Coordination normal.  Psychiatric:        Behavior: Behavior normal.       Assessment & Plan:   Problem List Items Addressed This Visit      Other   Fatigue - Primary   Relevant Orders   CBC with  Differential/Platelet   CMP14+EGFR   TSH   Vitamin B12    Other Visit Diagnoses    Other polyneuropathy       Relevant Orders   Vitamin B12       Follow up plan: Return if symptoms worsen or fail to improve.  Counseling provided for all of the vaccine components No orders of the defined types were placed in this encounter.   Caryl Pina, MD [facility] 06/28/2018, 11:25 AM

## 2018-06-29 LAB — CMP14+EGFR
ALT: 10 IU/L (ref 0–44)
AST: 11 IU/L (ref 0–40)
Albumin/Globulin Ratio: 1.8 (ref 1.2–2.2)
Albumin: 4.3 g/dL (ref 3.8–4.8)
Alkaline Phosphatase: 77 IU/L (ref 39–117)
BUN/Creatinine Ratio: 15 (ref 10–24)
BUN: 14 mg/dL (ref 8–27)
Bilirubin Total: 0.3 mg/dL (ref 0.0–1.2)
CO2: 24 mmol/L (ref 20–29)
Calcium: 9.4 mg/dL (ref 8.6–10.2)
Chloride: 103 mmol/L (ref 96–106)
Creatinine, Ser: 0.93 mg/dL (ref 0.76–1.27)
GFR calc Af Amer: 96 mL/min/{1.73_m2} (ref >59)
GFR calc non Af Amer: 83 mL/min/{1.73_m2} (ref >59)
Globulin, Total: 2.4 g/dL (ref 1.5–4.5)
Glucose: 82 mg/dL (ref 65–99)
Potassium: 3.8 mmol/L (ref 3.5–5.2)
Sodium: 143 mmol/L (ref 134–144)
Total Protein: 6.7 g/dL (ref 6.0–8.5)

## 2018-06-29 LAB — CBC WITH DIFFERENTIAL/PLATELET
Basophils Absolute: 0.1 10*3/uL (ref 0.0–0.2)
Basos: 1 %
EOS (ABSOLUTE): 0.1 10*3/uL (ref 0.0–0.4)
Eos: 1 %
Hematocrit: 39.9 % (ref 37.5–51.0)
Hemoglobin: 12 g/dL — ABNORMAL LOW (ref 13.0–17.7)
Immature Grans (Abs): 0 10*3/uL (ref 0.0–0.1)
Immature Granulocytes: 0 %
Lymphocytes Absolute: 1.7 10*3/uL (ref 0.7–3.1)
Lymphs: 22 %
MCH: 24.8 pg — ABNORMAL LOW (ref 26.6–33.0)
MCHC: 30.1 g/dL — ABNORMAL LOW (ref 31.5–35.7)
MCV: 83 fL (ref 79–97)
Monocytes Absolute: 0.5 10*3/uL (ref 0.1–0.9)
Monocytes: 7 %
Neutrophils Absolute: 5.1 10*3/uL (ref 1.4–7.0)
Neutrophils: 69 %
Platelets: 283 10*3/uL (ref 150–450)
RBC: 4.83 x10E6/uL (ref 4.14–5.80)
RDW: 15.3 % (ref 11.6–15.4)
WBC: 7.5 10*3/uL (ref 3.4–10.8)

## 2018-06-29 LAB — TSH: TSH: 2.74 u[IU]/mL (ref 0.450–4.500)

## 2018-06-29 LAB — VITAMIN B12: Vitamin B-12: 221 pg/mL — ABNORMAL LOW (ref 232–1245)

## 2018-08-03 ENCOUNTER — Encounter: Payer: Self-pay | Admitting: Family Medicine

## 2018-08-03 ENCOUNTER — Ambulatory Visit (INDEPENDENT_AMBULATORY_CARE_PROVIDER_SITE_OTHER): Payer: Medicare Other | Admitting: Family Medicine

## 2018-08-03 DIAGNOSIS — E538 Deficiency of other specified B group vitamins: Secondary | ICD-10-CM

## 2018-08-03 DIAGNOSIS — R202 Paresthesia of skin: Secondary | ICD-10-CM | POA: Diagnosis not present

## 2018-08-03 DIAGNOSIS — R200 Anesthesia of skin: Secondary | ICD-10-CM | POA: Insufficient documentation

## 2018-08-03 MED ORDER — B-12 100-5000 MCG SL SUBL: 1.0000 | SUBLINGUAL_TABLET | Freq: Every day | SUBLINGUAL | 6 refills | Status: DC

## 2018-08-03 NOTE — Progress Notes (Addendum)
Virtual Visit via telephone Note Due to COVID-19 pandemic this visit was conducted virtually. This visit type was conducted due to national recommendations for restrictions regarding the COVID-19 Pandemic (e.g. social distancing, sheltering in place) in an effort to limit this patient's exposure and mitigate transmission in our community. All issues noted in this document were discussed and addressed.  A physical exam was not performed with this format.   I connected with Todd Berg on 08/03/18 at 0945 by telephone and verified that I am speaking with the correct person using two identifiers. Todd Berg is currently located at home and wife is currently with them during visit. The provider, Monia Pouch, FNP is located in their office at time of visit.  I discussed the limitations, risks, security and privacy concerns of performing an evaluation and management service by telephone and the availability of in person appointments. I also discussed with the patient that there may be a patient responsible charge related to this service. The patient expressed understanding and agreed to proceed.  Subjective:  Patient ID: Todd Berg, male    DOB: [birth year], 68 y.o.   MRN: [id number]  Chief Complaint:  Numbness   HPI: Todd Berg is a 68 y.o. male presenting on 08/03/2018 for Numbness   Pt reports ongoing numbness and tingling in his lower legs and feet. States this started several months ago and is not getting better. States he is not taking B12 repletion therapy. States the numbness and tingling causes him to loose his balance at times. He denies focal deficits, weakness, confusion, dizziness, slurred speech, facial droop, headaches, visual changes, or chest pain.     Relevant past medical, surgical, family, and social history reviewed and updated as indicated.  Allergies and medications reviewed and updated.   Past Medical History:  Diagnosis Date  . Cancer ([place]) 2015   rectal  . Chronic back pain   . COPD (chronic obstructive pulmonary disease) ([place])   . GERD (gastroesophageal reflux disease)   . Hematemesis   . Hemorrhoids   . Hyperlipidemia   . Hypotension   . Melena   . Melena 10/12/2017  . Stroke [facility])     Past Surgical History:  Procedure Laterality Date  . BACK SURGERY    . BIOPSY  10/13/2017   Procedure: BIOPSY;  Surgeon: Laurence Spates, MD;  Location: [facility] ENDOSCOPY;  Service: Endoscopy;;  . ESOPHAGOGASTRODUODENOSCOPY N/A 06/09/2016   Procedure: ESOPHAGOGASTRODUODENOSCOPY (EGD);  Surgeon: Wilford Corner, MD;  Location: [place];  Service: Endoscopy;  Laterality: N/A;  . ESOPHAGOGASTRODUODENOSCOPY (EGD) WITH PROPOFOL Left 11/21/2016   Procedure: ESOPHAGOGASTRODUODENOSCOPY (EGD) WITH PROPOFOL;  Surgeon: Arta Silence, MD;  Location: [facility] ENDOSCOPY;  Service: Endoscopy;  Laterality: Left;  . ESOPHAGOGASTRODUODENOSCOPY (EGD) WITH PROPOFOL N/A 10/13/2017   Procedure: ESOPHAGOGASTRODUODENOSCOPY (EGD) WITH PROPOFOL;  Surgeon: Laurence Spates, MD;  Location: [place];  Service: Endoscopy;  Laterality: N/A;  . HEMORRHOID SURGERY    . HERNIA REPAIR    . RECTAL SURGERY      Social History   Socioeconomic History  . Marital status: Married    Spouse name: Not on file  . Number of children: 5  . Years of education: Not on file  . Highest education level: Not on file  Occupational History  . Not on file  Social Needs  . Financial resource strain: Not on file  . Food insecurity    Worry: Never true    Inability: Never true  . Transportation needs  Medical: No    Non-medical: No  Tobacco Use  . Smoking status: Former Smoker    Start date: 12/30/1964    Quit date: 06/19/2009    Years since quitting: 9.1  . Smokeless tobacco: Former Systems developer    Types: Snuff    Quit date: 09/26/2005  Substance and Sexual Activity  . Alcohol use: No  . Drug use: No  . Sexual activity: Not on file  Lifestyle  . Physical activity    Days per week: 0  days    Minutes per session: 0 min  . Stress: Not at all  Relationships  . Social Herbalist on phone: Once a week    Gets together: Never    Attends religious service: Never    Active member of club or organization: No    Attends meetings of clubs or organizations: Never    Relationship status: Married  . Intimate partner violence    Fear of current or ex partner: Not on file    Emotionally abused: Not on file    Physically abused: Not on file    Forced sexual activity: Not on file  Other Topics Concern  . Not on file  Social History Narrative   Married, 5 children, several grandchildren    Outpatient Encounter Medications as of 08/03/2018  Medication Sig  . amLODipine (NORVASC) 2.5 MG tablet Take 1 tablet (2.5 mg total) by mouth daily.  . Cobalamin Combinations (B-12) [phone] MCG SUBL Place 1 tablet under the tongue daily.  . fluticasone (FLONASE) 50 MCG/ACT nasal spray Place 1 spray into both nostrils 2 (two) times daily as needed for allergies or rhinitis.  . pantoprazole (PROTONIX) 40 MG tablet Take 1 tablet (40 mg total) by mouth daily.  Marland Kitchen tetrahydrozoline 0.05 % ophthalmic solution Place 1 drop into both eyes every other day.    No facility-administered encounter medications on file as of 08/03/2018.     No Known Allergies  Review of Systems  Constitutional: Negative for activity change, appetite change and unexpected weight change.  Eyes: Negative for photophobia and visual disturbance.  Respiratory: Negative for chest tightness and shortness of breath.   Cardiovascular: Negative for palpitations and leg swelling.  Gastrointestinal: Negative for anal bleeding and blood in stool.  Musculoskeletal: Positive for gait problem (due to numbness and tingling in feet).  Neurological: Negative for tremors, seizures, syncope, facial asymmetry, speech difficulty and light-headedness.  Psychiatric/Behavioral: Negative for confusion.  All other systems reviewed and are  negative.        Observations/Objective: No vital signs or physical exam, this was a telephone or virtual health encounter.  Pt alert and oriented, answers all questions appropriately, and able to speak in full sentences.    Assessment and Plan: [place] was seen today for numbness.  Diagnoses and all orders for this visit:  Numbness and tingling of both legs Vitamin B12 deficiency Ongoing paresthesia of bilateral lower extremities without focal deficits. No red flags concerning for acute neurovascular event.  Balance problems due to paresthesia. Vit B12 was low on 06/28/2018. Pt is not currently on repletion therapy. Will order today. If symptoms do not improve with repletion therapy, will explore other causes of paresthesia. Follow up with PCP in 4 weeks for reevaluation.  -     Cobalamin Combinations (B-12) [phone] MCG SUBL; Place 1 tablet under the tongue daily.    Follow Up Instructions: Return in about 4 weeks (around 08/31/2018), or if symptoms worsen or fail to improve,  for PCP for leg paresthesia.    I discussed the assessment and treatment plan with the patient. The patient was provided an opportunity to ask questions and all were answered. The patient agreed with the plan and demonstrated an understanding of the instructions.   The patient was advised to call back or seek an in-person evaluation if the symptoms worsen or if the condition fails to improve as anticipated.  The above assessment and management plan was discussed with the patient. The patient verbalized understanding of and has agreed to the management plan. Patient is aware to call the clinic if symptoms persist or worsen. Patient is aware when to return to the clinic for a follow-up visit. Patient educated on when it is appropriate to go to the emergency department.    I provided 15 minutes of non-face-to-face time during this encounter. The call started at 0945. The call ended at 1000. The other time was  used for coordination of care.    Monia Pouch, FNP-C [facility] [address] [place], [place] [address] [phone] 08/03/18

## 2018-09-07 ENCOUNTER — Ambulatory Visit (INDEPENDENT_AMBULATORY_CARE_PROVIDER_SITE_OTHER): Payer: Medicare Other | Admitting: *Deleted

## 2018-09-07 VITALS — Ht 73.0 in | Wt 209.0 lb

## 2018-09-07 DIAGNOSIS — Z0000 Encounter for general adult medical examination without abnormal findings: Secondary | ICD-10-CM

## 2018-09-07 NOTE — Progress Notes (Signed)
MEDICARE ANNUAL WELLNESS VISIT  09/07/2018  Telephone Visit Disclaimer This Medicare AWV was conducted by telephone due to national recommendations for restrictions regarding the COVID-19 Pandemic (e.g. social distancing).  I verified, using two identifiers, that I am speaking with Todd Berg or their authorized healthcare agent. I discussed the limitations, risks, security, and privacy concerns of performing an evaluation and management service by telephone and the potential availability of an in-person appointment in the future. The patient expressed understanding and agreed to proceed.   Subjective:  Todd Berg is a 68 y.o. male patient of Dettinger, [name], MD who had a Medicare Annual Wellness Visit today via telephone. Todd Berg is Retired and lives with their spouse. he has 5 children. he reports that he is socially active and does interact with friends/family regularly. he is not physically active and enjoys cars.  Patient Care Team: Dettinger, [name], MD as PCP - General (Family Medicine) Claris Gower, NP as Nurse Practitioner (Nurse Practitioner)  Advanced Directives 09/07/2018 01/27/2018 01/16/2018 10/25/2017 10/25/2017 10/12/2017 09/22/2017  Does Patient Have a Medical Advance Directive? No No No No No No No  Would patient like information on creating a medical advance directive? Yes (MAU/Ambulatory/Procedural Areas - Information given) No - Patient declined No - Patient declined No - Patient declined No - Patient declined No - Patient declined No - Patient declined    Hospital Utilization Over the Past 12 Months: # of hospitalizations or ER visits: 0 # of surgeries: 0  Review of Systems    Patient reports that his overall health is unchanged compared to last year.  Patient Reported Readings (BP, Pulse, CBG, Weight, etc) none  Review of Systems: No complaints  All other systems negative.  Pain Assessment Pain : No/denies pain     Current Medications  & Allergies (verified) Allergies as of 09/07/2018   No Known Allergies     Medication List       Accurate as of September 07, 2018  8:48 AM. If you have any questions, ask your nurse or doctor.        amLODipine 2.5 MG tablet Commonly known as: NORVASC Take 1 tablet (2.5 mg total) by mouth daily.   B-12 [phone] MCG Subl Place 1 tablet under the tongue daily.   fluticasone 50 MCG/ACT nasal spray Commonly known as: FLONASE Place 1 spray into both nostrils 2 (two) times daily as needed for allergies or rhinitis.   pantoprazole 40 MG tablet Commonly known as: PROTONIX Take 1 tablet (40 mg total) by mouth daily.   tetrahydrozoline 0.05 % ophthalmic solution Place 1 drop into both eyes every other day.       History (reviewed): Past Medical History:  Diagnosis Date  . Cancer ([place]) 2015   rectal  . Chronic back pain   . COPD (chronic obstructive pulmonary disease) ([place])   . GERD (gastroesophageal reflux disease)   . Hematemesis   . Hemorrhoids   . Hyperlipidemia   . Hypotension   . Melena   . Melena 10/12/2017  . Stroke [facility])    Past Surgical History:  Procedure Laterality Date  . BACK SURGERY    . BIOPSY  10/13/2017   Procedure: BIOPSY;  Surgeon: Laurence Spates, MD;  Location: [facility] ENDOSCOPY;  Service: Endoscopy;;  . ESOPHAGOGASTRODUODENOSCOPY N/A 06/09/2016   Procedure: ESOPHAGOGASTRODUODENOSCOPY (EGD);  Surgeon: Wilford Corner, MD;  Location: [place];  Service: Endoscopy;  Laterality: N/A;  . ESOPHAGOGASTRODUODENOSCOPY (EGD) WITH PROPOFOL Left 11/21/2016   Procedure:  ESOPHAGOGASTRODUODENOSCOPY (EGD) WITH PROPOFOL;  Surgeon: Arta Silence, MD;  Location: [place];  Service: Endoscopy;  Laterality: Left;  . ESOPHAGOGASTRODUODENOSCOPY (EGD) WITH PROPOFOL N/A 10/13/2017   Procedure: ESOPHAGOGASTRODUODENOSCOPY (EGD) WITH PROPOFOL;  Surgeon: Laurence Spates, MD;  Location: [place];  Service: Endoscopy;  Laterality: N/A;  . HEMORRHOID SURGERY    . HERNIA  REPAIR    . RECTAL SURGERY     Family History  Problem Relation Age of Onset  . COPD Mother   . Obesity Mother   . Stroke Father    Social History   Socioeconomic History  . Marital status: Married    Spouse name: Not on file  . Number of children: 5  . Years of education: Not on file  . Highest education level: Not on file  Occupational History  . Occupation: Retired  Scientific laboratory technician  . Financial resource strain: Not hard at all  . Food insecurity    Worry: Never true    Inability: Never true  . Transportation needs    Medical: No    Non-medical: No  Tobacco Use  . Smoking status: Former Smoker    Start date: 12/30/1964    Quit date: 06/19/2009    Years since quitting: 9.2  . Smokeless tobacco: Former Systems developer    Types: Snuff    Quit date: 09/26/2005  Substance and Sexual Activity  . Alcohol use: No  . Drug use: No  . Sexual activity: Not on file  Lifestyle  . Physical activity    Days per week: 0 days    Minutes per session: 0 min  . Stress: Not at all  Relationships  . Social Herbalist on phone: Once a week    Gets together: Never    Attends religious service: Never    Active member of club or organization: No    Attends meetings of clubs or organizations: Never    Relationship status: Married  Other Topics Concern  . Not on file  Social History Narrative   Married, 5 children, several grandchildren    Activities of Daily Living In your present state of health, do you have any difficulty performing the following activities: 09/07/2018 10/25/2017  Hearing? Y N  Vision? N N  Difficulty concentrating or making decisions? N N  Walking or climbing stairs? N Y  Dressing or bathing? N N  Doing errands, shopping? N N  Preparing Food and eating ? N -  Using the Toilet? N -  In the past six months, have you accidently leaked urine? N -  Do you have problems with loss of bowel control? N -  Managing your Medications? N -  Managing your Finances? N -   Housekeeping or managing your Housekeeping? N -  Some recent data might be hidden    Patient Education/ Literacy How often do you need to have someone help you when you read instructions, pamphlets, or other written materials from your doctor or pharmacy?: 1 - Never  Exercise Current Exercise Habits: The patient does not participate in regular exercise at present  Diet Patient reports consuming 3 meals a day and 2 snack(s) a day Patient reports that his primary diet is: Regular Patient reports that she does have regular access to food.   Depression Screen PHQ 2/9 Scores 09/07/2018 06/28/2018 03/15/2018 03/10/2018 12/20/2017 12/09/2017 11/17/2017  PHQ - 2 Score 0 0 0 0 0 0 0     Fall Risk Fall Risk  09/07/2018 06/28/2018 03/15/2018 12/09/2017 10/21/2017  Falls in the past year? 0 0 0 0 Yes  Number falls in past yr: 0 - - - 1  Injury with Fall? 0 - - - No     Objective:  Todd Berg seemed alert and oriented and he participated appropriately during our telephone visit.  Blood Pressure Weight BMI  BP Readings from Last 3 Encounters:  06/28/18 137/79  03/31/18 (!) 141/85  03/15/18 140/78   Wt Readings from Last 3 Encounters:  09/07/18 208 lb 15.9 oz (94.8 kg)  06/28/18 209 lb (94.8 kg)  03/31/18 202 lb 6.4 oz (91.8 kg)   BMI Readings from Last 1 Encounters:  09/07/18 27.57 kg/m    *Unable to obtain current vital signs, weight, and BMI due to telephone visit type  Hearing/Vision  . Ellard did  seem to have difficulty with hearing/understanding during the telephone conversation . Reports that he has not had a formal eye exam by an eye care professional within the past year . Reports that he has not had a formal hearing evaluation within the past year *Unable to fully assess hearing and vision during telephone visit type  Cognitive Function: 6CIT Screen 09/07/2018 09/06/2017  What Year? 0 points 0 points  What month? 0 points 0 points  What time? 0 points 0 points  Count  back from 20 0 points 0 points  Months in reverse 0 points 0 points  Repeat phrase 0 points 0 points  Total Score 0 0   (Normal:0-7, Significant for Dysfunction: >8)  Normal Cognitive Function Screening: Yes   Immunization & Health Maintenance Record Immunization History  Administered Date(s) Administered  . Influenza, High Dose Seasonal PF 10/13/2017  . Pneumococcal Polysaccharide-23 10/13/2017  . Rabies, IM 09/14/2000, 10/11/2000, 10/18/2000, 11/02/2000    Health Maintenance  Topic Date Due  . Samul Dada  12/31/1967  . INFLUENZA VACCINE  08/12/2018  . Hepatitis C Screening  09/07/2018 (Originally May 26, 1950)  . PNA vac Low Risk Adult (2 of 2 - PCV13) 10/14/2018  . COLONOSCOPY  08/11/2025       Assessment  This is a routine wellness examination for [employer].  Health Maintenance: Due or Overdue Health Maintenance Due  Topic Date Due  . TETANUS/TDAP  12/31/1967  . INFLUENZA VACCINE  08/12/2018    Todd Berg for Community Assistance: Care Management:   no Social Work:    no Prescription Assistance:  no Nutrition/Diabetes Education:  no   Plan:  Personalized Goals Goals Addressed   None    Personalized Health Maintenance & Screening Recommendations  Influenza vaccine Td vaccine  Lung Cancer Screening Recommended: no (Low Dose CT Chest recommended if Age 31-80 years, 30 pack-year currently smoking OR have quit w/in past 15 years) Hepatitis C Screening recommended: yes HIV Screening recommended: no  Advanced Directives: Written information was not prepared per patient's request.  Referrals & Orders No orders of the defined types were placed in this encounter.   Follow-up Plan . Follow-up with Dettinger, [name], MD as planned   I have personally reviewed and noted the following in the patient's chart:   . Medical and social history . Use of alcohol, tobacco or illicit drugs  . Current medications and  supplements . Functional ability and status . Nutritional status . Physical activity . Advanced directives . List of other physicians . Hospitalizations, surgeries, and ER visits in previous 12 months . Vitals . Screenings to include cognitive, depression, and falls . Referrals and appointments  In addition, I have reviewed and discussed with Todd Berg certain preventive protocols, quality metrics, and best practice recommendations. A written personalized care plan for preventive services as well as general preventive health recommendations is available and can be mailed to the patient at his request.      Wardell Heath, LPN  [id number]

## 2018-09-07 NOTE — Patient Instructions (Signed)
[name] , Thank you for taking time to come for your Medicare Wellness Visit. I appreciate your ongoing commitment to your health goals. Please review the following plan we discussed and let me know if I can assist you in the future.   These are the goals we discussed: Goals    . DIET - DECREASE SODA OR JUICE INTAKE    . DIET - INCREASE WATER INTAKE    . Exercise 150 min/wk Moderate Activity       This is a list of the screening recommended for you and due dates:  Health Maintenance  Topic Date Due  . Tetanus Vaccine  12/31/1967  . Flu Shot  08/12/2018  .  Hepatitis C: One time screening is recommended by Center for Disease Control  (CDC) for  adults born from [birth year] through 1967.   09/07/2018*  . Pneumonia vaccines (2 of 2 - PCV13) 10/14/2018  . Colon Cancer Screening  08/11/2025  *Topic was postponed. The date shown is not the original due date.     Preventive Care 53 Years and Older, Male Preventive care refers to lifestyle choices and visits with your health care provider that can promote health and wellness. This includes:  A yearly physical exam. This is also called an annual well check.  Regular dental and eye exams.  Immunizations.  Screening for certain conditions.  Healthy lifestyle choices, such as diet and exercise. What can I expect for my preventive care visit? Physical exam Your health care provider will check:  Height and weight. These may be used to calculate body mass index (BMI), which is a measurement that tells if you are at a healthy weight.  Heart rate and blood pressure.  Your skin for abnormal spots. Counseling Your health care provider may ask you questions about:  Alcohol, tobacco, and drug use.  Emotional well-being.  Home and relationship well-being.  Sexual activity.  Eating habits.  History of falls.  Memory and ability to understand (cognition).  Work and work Statistician. What immunizations do I need?  Influenza (flu)  vaccine  This is recommended every year. Tetanus, diphtheria, and pertussis (Tdap) vaccine  You may need a Td booster every 10 years. Varicella (chickenpox) vaccine  You may need this vaccine if you have not already been vaccinated. Zoster (shingles) vaccine  You may need this after age 26. Pneumococcal conjugate (PCV13) vaccine  One dose is recommended after age 62. Pneumococcal polysaccharide (PPSV23) vaccine  One dose is recommended after age 44. Measles, mumps, and rubella (MMR) vaccine  You may need at least one dose of MMR if you were born in 1957 or later. You may also need a second dose. Meningococcal conjugate (MenACWY) vaccine  You may need this if you have certain conditions. Hepatitis A vaccine  You may need this if you have certain conditions or if you travel or work in places where you may be exposed to hepatitis A. Hepatitis B vaccine  You may need this if you have certain conditions or if you travel or work in places where you may be exposed to hepatitis B. Haemophilus influenzae type b (Hib) vaccine  You may need this if you have certain conditions. You may receive vaccines as individual doses or as more than one vaccine together in one shot (combination vaccines). Talk with your health care provider about the risks and benefits of combination vaccines. What tests do I need? Blood tests  Lipid and cholesterol levels. These may be checked every 5 years,  or more frequently depending on your overall health.  Hepatitis C test.  Hepatitis B test. Screening  Lung cancer screening. You may have this screening every year starting at age 44 if you have a 30-pack-year history of smoking and currently smoke or have quit within the past 15 years.  Colorectal cancer screening. All adults should have this screening starting at age 31 and continuing until age 87. Your health care provider may recommend screening at age 42 if you are at increased risk. You will have  tests every 1-10 years, depending on your results and the type of screening test.  Prostate cancer screening. Recommendations will vary depending on your family history and other risks.  Diabetes screening. This is done by checking your blood sugar (glucose) after you have not eaten for a while (fasting). You may have this done every 1-3 years.  Abdominal aortic aneurysm (AAA) screening. You may need this if you are a current or former smoker.  Sexually transmitted disease (STD) testing. Follow these instructions at home: Eating and drinking  Eat a diet that includes fresh fruits and vegetables, whole grains, lean protein, and low-fat dairy products. Limit your intake of foods with high amounts of sugar, saturated fats, and salt.  Take vitamin and mineral supplements as recommended by your health care provider.  Do not drink alcohol if your health care provider tells you not to drink.  If you drink alcohol: ? Limit how much you have to 0-2 drinks a day. ? Be aware of how much alcohol is in your drink. In the [place] one drink equals one 12 oz bottle of beer (355 mL), one 5 oz glass of wine (148 mL), or one 1 oz glass of hard liquor (44 mL). Lifestyle  Take daily care of your teeth and gums.  Stay active. Exercise for at least 30 minutes on 5 or more days each week.  Do not use any products that contain nicotine or tobacco, such as cigarettes, e-cigarettes, and chewing tobacco. If you need help quitting, ask your health care provider.  If you are sexually active, practice safe sex. Use a condom or other form of protection to prevent STIs (sexually transmitted infections).  Talk with your health care provider about taking a low-dose aspirin or statin. What's next?  Visit your health care provider once a year for a well check visit.  Ask your health care provider how often you should have your eyes and teeth checked.  Stay up to date on all vaccines. This information is not  intended to replace advice given to you by your health care provider. Make sure you discuss any questions you have with your health care provider. Document Released: 01/24/2015 Document Revised: 12/22/2017 Document Reviewed: 12/22/2017 Elsevier Patient Education  2020 [employer].

## 2018-09-08 ENCOUNTER — Encounter: Payer: Medicare Other | Admitting: *Deleted

## 2018-09-12 ENCOUNTER — Other Ambulatory Visit: Payer: Self-pay

## 2018-09-13 ENCOUNTER — Ambulatory Visit (INDEPENDENT_AMBULATORY_CARE_PROVIDER_SITE_OTHER): Payer: Medicare Other | Admitting: Family Medicine

## 2018-09-13 ENCOUNTER — Encounter: Payer: Self-pay | Admitting: Family Medicine

## 2018-09-13 VITALS — BP 152/75 | HR 58 | Temp 99.3°F | Ht 73.0 in | Wt 204.8 lb

## 2018-09-13 DIAGNOSIS — N529 Male erectile dysfunction, unspecified: Secondary | ICD-10-CM | POA: Diagnosis not present

## 2018-09-13 DIAGNOSIS — G629 Polyneuropathy, unspecified: Secondary | ICD-10-CM

## 2018-09-13 DIAGNOSIS — R6889 Other general symptoms and signs: Secondary | ICD-10-CM | POA: Diagnosis not present

## 2018-09-13 DIAGNOSIS — E538 Deficiency of other specified B group vitamins: Secondary | ICD-10-CM | POA: Diagnosis not present

## 2018-09-13 MED ORDER — SILDENAFIL CITRATE 20 MG PO TABS: 20.0000 mg | ORAL_TABLET | ORAL | 1 refills | Status: DC | PRN

## 2018-09-13 NOTE — Progress Notes (Signed)
BP (!) 152/75   Pulse (!) 58   Temp 99.3 F (37.4 C) (Temporal)   Ht '6\' 1"'  (1.854 m)   Wt 204 lb 12.8 oz (92.9 kg)   BMI 27.02 kg/m    Subjective:   Patient ID: Todd Berg, male    DOB: 1950/06/27, 68 y.o.   MRN: [id number]  HPI: Todd Berg is a 68 y.o. male presenting on 09/13/2018 for Numbness (bilateral feet- Patient states it has been ongoing. Patient also states that he has been having numbness in bilateral legs)   HPI Patient comes in complaining of bilateral numbness in both of his feet that has been ongoing for quite some time but is worsened over the past month.  Patient is coming in with erectile dysfunction and wants to discuss doing something like Viagra.  He has tried it previously and has not had issues and would like to try it again.  Relevant past medical, surgical, family and social history reviewed and updated as indicated. Interim medical history since our last visit reviewed. Allergies and medications reviewed and updated.  Review of Systems  Constitutional: Negative for chills and fever.  Respiratory: Negative for shortness of breath and wheezing.   Cardiovascular: Negative for chest pain and leg swelling.  Musculoskeletal: Negative for back pain and gait problem.  Skin: Negative for rash.  Neurological: Positive for numbness. Negative for dizziness and weakness.  All other systems reviewed and are negative.   Per HPI unless specifically indicated above   Allergies as of 09/13/2018   No Known Allergies     Medication List       Accurate as of September 13, 2018  1:35 PM. If you have any questions, ask your nurse or doctor.        amLODipine 2.5 MG tablet Commonly known as: NORVASC Take 1 tablet (2.5 mg total) by mouth daily.   B-12 [phone] MCG Subl Place 1 tablet under the tongue daily.   fluticasone 50 MCG/ACT nasal spray Commonly known as: FLONASE Place 1 spray into both nostrils 2 (two) times daily as needed for allergies or  rhinitis.   pantoprazole 40 MG tablet Commonly known as: PROTONIX Take 1 tablet (40 mg total) by mouth daily.   sildenafil 20 MG tablet Commonly known as: REVATIO Take 1-3 tablets (20-60 mg total) by mouth as needed. Started by: [name] Alim Cattell, MD   tetrahydrozoline 0.05 % ophthalmic solution Place 1 drop into both eyes every other day.        Objective:   BP (!) 152/75   Pulse (!) 58   Temp 99.3 F (37.4 C) (Temporal)   Ht '6\' 1"'  (1.854 m)   Wt 204 lb 12.8 oz (92.9 kg)   BMI 27.02 kg/m   Wt Readings from Last 3 Encounters:  09/13/18 204 lb 12.8 oz (92.9 kg)  09/07/18 208 lb 15.9 oz (94.8 kg)  06/28/18 209 lb (94.8 kg)    Physical Exam Vitals signs and nursing note reviewed.  Constitutional:      General: He is not in acute distress.    Appearance: He is well-developed. He is not diaphoretic.  Eyes:     General: No scleral icterus.    Conjunctiva/sclera: Conjunctivae normal.  Neck:     Thyroid: No thyromegaly.  Cardiovascular:     Rate and Rhythm: Normal rate and regular rhythm.     Heart sounds: Normal heart sounds. No murmur.  Pulmonary:     Effort: Pulmonary effort is normal.  No respiratory distress.     Breath sounds: Normal breath sounds. No wheezing.  Skin:    General: Skin is warm and dry.     Findings: No rash.  Neurological:     Mental Status: He is alert and oriented to person, place, and time.     Coordination: Coordination normal.  Psychiatric:        Behavior: Behavior normal.      Assessment & Plan:   Problem List Items Addressed This Visit    None    Visit Diagnoses    Neuropathy    -  Primary   Relevant Orders   Vitamin B12 (Completed)   TSH (Completed)   CBC with Differential/Platelet (Completed)   CMP14+EGFR (Completed)   Magnesium (Completed)   Erectile dysfunction, unspecified erectile dysfunction type       Relevant Medications   sildenafil (REVATIO) 20 MG tablet    Patient has erectile dysfunction and wants to try  Viagra.  Patient has tingling and numbness of burning sensation on the bottom of both his feet that is been going on for quite some time but has worsened over the past couple months.  He does take B12.  We will consider neurology referral if all the labs are normal.  Patient denies any back issues. Follow up plan: Return in about 3 months (around 12/13/2018), or if symptoms worsen or fail to improve, for Anemia follow-up.  Counseling provided for all of the vaccine components Orders Placed This Encounter  Procedures  . Vitamin B12  . TSH  . CBC with Differential/Platelet  . CMP14+EGFR  . Magnesium    Caryl Pina, MD [facility] 09/13/2018, 1:35 PM

## 2018-09-14 ENCOUNTER — Telehealth: Payer: Self-pay | Admitting: Family Medicine

## 2018-09-14 LAB — CMP14+EGFR
ALT: 8 IU/L (ref 0–44)
AST: 14 IU/L (ref 0–40)
Albumin/Globulin Ratio: 1.5 (ref 1.2–2.2)
Albumin: 4 g/dL (ref 3.8–4.8)
Alkaline Phosphatase: 83 IU/L (ref 39–117)
BUN/Creatinine Ratio: 17 (ref 10–24)
BUN: 19 mg/dL (ref 8–27)
Bilirubin Total: 0.3 mg/dL (ref 0.0–1.2)
CO2: 23 mmol/L (ref 20–29)
Calcium: 9.7 mg/dL (ref 8.6–10.2)
Chloride: 103 mmol/L (ref 96–106)
Creatinine, Ser: 1.09 mg/dL (ref 0.76–1.27)
GFR calc Af Amer: 80 mL/min/{1.73_m2} (ref >59)
GFR calc non Af Amer: 69 mL/min/{1.73_m2} (ref >59)
Globulin, Total: 2.6 g/dL (ref 1.5–4.5)
Glucose: 87 mg/dL (ref 65–99)
Potassium: 4 mmol/L (ref 3.5–5.2)
Sodium: 140 mmol/L (ref 134–144)
Total Protein: 6.6 g/dL (ref 6.0–8.5)

## 2018-09-14 LAB — TSH: TSH: 1.9 u[IU]/mL (ref 0.450–4.500)

## 2018-09-14 LAB — CBC WITH DIFFERENTIAL/PLATELET
Basophils Absolute: 0.1 10*3/uL (ref 0.0–0.2)
Basos: 1 %
EOS (ABSOLUTE): 0.1 10*3/uL (ref 0.0–0.4)
Eos: 1 %
Hematocrit: 38.1 % (ref 37.5–51.0)
Hemoglobin: 12.3 g/dL — ABNORMAL LOW (ref 13.0–17.7)
Immature Grans (Abs): 0 10*3/uL (ref 0.0–0.1)
Immature Granulocytes: 0 %
Lymphocytes Absolute: 1.5 10*3/uL (ref 0.7–3.1)
Lymphs: 22 %
MCH: 26.1 pg — ABNORMAL LOW (ref 26.6–33.0)
MCHC: 32.3 g/dL (ref 31.5–35.7)
MCV: 81 fL (ref 79–97)
Monocytes Absolute: 0.6 10*3/uL (ref 0.1–0.9)
Monocytes: 8 %
Neutrophils Absolute: 4.7 10*3/uL (ref 1.4–7.0)
Neutrophils: 68 %
Platelets: 291 10*3/uL (ref 150–450)
RBC: 4.72 x10E6/uL (ref 4.14–5.80)
RDW: 14 % (ref 11.6–15.4)
WBC: 6.9 10*3/uL (ref 3.4–10.8)

## 2018-09-14 LAB — MAGNESIUM: Magnesium: 2.1 mg/dL (ref 1.6–2.3)

## 2018-09-14 LAB — VITAMIN B12: Vitamin B-12: 1523 pg/mL — ABNORMAL HIGH (ref 232–1245)

## 2018-09-14 MED ORDER — GABAPENTIN 100 MG PO CAPS: 100.0000 mg | ORAL_CAPSULE | Freq: Three times a day (TID) | ORAL | 3 refills | Status: DC

## 2018-09-14 MED ORDER — TRIAMCINOLONE ACETONIDE 0.1 % EX CREA: 1.0000 "application " | TOPICAL_CREAM | Freq: Two times a day (BID) | CUTANEOUS | 0 refills | Status: DC

## 2018-09-14 NOTE — Telephone Encounter (Signed)
Patient was seen for bilateral leg pain.  He states he thought you were going to send in something for the pain in his legs.  He would like to know if you will and also would like a refill of Triamcinolone cream he has had in the past, [employer].

## 2018-09-14 NOTE — Telephone Encounter (Signed)
See result note, I wanted to test bloodwork first to find a possible cause.

## 2018-09-14 NOTE — Telephone Encounter (Signed)
Sent triamcinolone

## 2018-09-14 NOTE — Telephone Encounter (Signed)
-----   Message from Karle Plumber, [place] sent at 09/14/2018  3:48 PM EDT ----- Patient aware and verbalizes understanding. States he would like to try gabapentin - please send in rx.

## 2018-09-14 NOTE — Telephone Encounter (Signed)
Sent in gabapentin

## 2018-09-14 NOTE — Telephone Encounter (Signed)
Will you send in the Triamcinolone cream?

## 2018-09-15 ENCOUNTER — Telehealth: Payer: Self-pay | Admitting: Family Medicine

## 2018-09-15 NOTE — Telephone Encounter (Signed)
Aware.  This is medication for having sex.  Can start with one pill to see if this is effective.  Beware of  blood pressure side effects.

## 2018-09-22 ENCOUNTER — Encounter: Payer: Self-pay | Admitting: Family Medicine

## 2018-09-22 ENCOUNTER — Other Ambulatory Visit: Payer: Self-pay

## 2018-09-22 ENCOUNTER — Ambulatory Visit (INDEPENDENT_AMBULATORY_CARE_PROVIDER_SITE_OTHER): Payer: Medicare Other | Admitting: Family Medicine

## 2018-09-22 VITALS — BP 148/79 | HR 82 | Temp 99.3°F | Ht 73.0 in | Wt 208.8 lb

## 2018-09-22 DIAGNOSIS — L2089 Other atopic dermatitis: Secondary | ICD-10-CM | POA: Diagnosis not present

## 2018-09-22 MED ORDER — TRIAMCINOLONE ACETONIDE 0.1 % EX CREA: 1.0000 "application " | TOPICAL_CREAM | Freq: Two times a day (BID) | CUTANEOUS | 0 refills | Status: DC

## 2018-09-22 MED ORDER — NYSTATIN 100000 UNIT/GM EX OINT: 1.0000 "application " | TOPICAL_OINTMENT | Freq: Two times a day (BID) | CUTANEOUS | 0 refills | Status: DC

## 2018-09-22 NOTE — Progress Notes (Signed)
BP (!) 148/79   Pulse 82   Temp 99.3 F (37.4 C) (Temporal)   Ht 6\' 1"  (1.854 m)   Wt 208 lb 12.8 oz (94.7 kg)   SpO2 97%   BMI 27.55 kg/m    Subjective:   Patient ID: Todd Berg, male    DOB: [birth year], 68 y.o.   MRN: [id number]  HPI: Todd Berg is a 68 y.o. male presenting on 09/22/2018 for calf pain (bilateral and itching- x 1 month)   HPI Patient comes in complaining of an itchy rash on the back of both of his calfs that is been going on for 1 month.  He says that this is been an issue and has been increasing and he has tried to use some different over-the-counter creams and Vaseline's for it he does not notice a significant difference.  He denies any rash anywhere else and he denies any pain or redness or warmth but just has that itching.  He says is not actually a calf pain but just more on the skin and especially where his socks overlap it sometimes causes him and big issue.  Relevant past medical, surgical, family and social history reviewed and updated as indicated. Interim medical history since our last visit reviewed. Allergies and medications reviewed and updated.  Review of Systems  Constitutional: Negative for chills and fever.  Respiratory: Negative for shortness of breath and wheezing.   Cardiovascular: Negative for chest pain and leg swelling.  Skin: Positive for rash.  All other systems reviewed and are negative.   Per HPI unless specifically indicated above      Objective:   BP (!) 148/79   Pulse 82   Temp 99.3 F (37.4 C) (Temporal)   Ht 6\' 1"  (1.854 m)   Wt 208 lb 12.8 oz (94.7 kg)   SpO2 97%   BMI 27.55 kg/m   Wt Readings from Last 3 Encounters:  09/22/18 208 lb 12.8 oz (94.7 kg)  09/13/18 204 lb 12.8 oz (92.9 kg)  09/07/18 208 lb 15.9 oz (94.8 kg)    Physical Exam Vitals signs and nursing note reviewed.  Constitutional:      General: He is not in acute distress.    Appearance: He is well-developed. He is not diaphoretic.   Eyes:     General: No scleral icterus.    Conjunctiva/sclera: Conjunctivae normal.  Neck:     Thyroid: No thyromegaly.  Musculoskeletal:        General: No swelling.  Skin:    General: Skin is warm and dry.     Findings: Rash (Maculopapular rash with scaling on the back of both of his calves, looks to be consistent with an atopic dermatitis or eczema.  Has a little bit of pink borders that could be also consistent with fungal) present.  Neurological:     Mental Status: He is alert and oriented to person, place, and time.     Coordination: Coordination normal.  Psychiatric:        Behavior: Behavior normal.       Assessment & Plan:   Problem List Items Addressed This Visit    None    Visit Diagnoses    Other atopic dermatitis    -  Primary   Relevant Medications   nystatin ointment (MYCOSTATIN)   triamcinolone cream (KENALOG) 0.1 %      Will treat like atopic dermatitis but also given antifungal cream and try for 2 weeks and if not improved then  we may go see dermatology Follow up plan: Return if symptoms worsen or fail to improve.  Counseling provided for all of the vaccine components No orders of the defined types were placed in this encounter.   Caryl Pina, MD [facility] 09/22/2018, 3:11 PM

## 2018-10-03 DIAGNOSIS — R1084 Generalized abdominal pain: Secondary | ICD-10-CM | POA: Diagnosis not present

## 2018-10-03 DIAGNOSIS — I10 Essential (primary) hypertension: Secondary | ICD-10-CM | POA: Diagnosis not present

## 2018-10-03 DIAGNOSIS — R001 Bradycardia, unspecified: Secondary | ICD-10-CM | POA: Diagnosis not present

## 2018-10-04 ENCOUNTER — Other Ambulatory Visit: Payer: Self-pay

## 2018-10-04 ENCOUNTER — Emergency Department (HOSPITAL_COMMUNITY)
Admission: EM | Admit: 2018-10-04 | Discharge: 2018-10-04 | Disposition: A | Discharge status: Home / Self Care | Payer: Medicare Other | Attending: Emergency Medicine | Admitting: Emergency Medicine

## 2018-10-04 ENCOUNTER — Telehealth: Payer: Self-pay | Admitting: Family Medicine

## 2018-10-04 DIAGNOSIS — Z8673 Personal history of transient ischemic attack (TIA), and cerebral infarction without residual deficits: Secondary | ICD-10-CM | POA: Diagnosis not present

## 2018-10-04 DIAGNOSIS — R001 Bradycardia, unspecified: Secondary | ICD-10-CM | POA: Diagnosis not present

## 2018-10-04 DIAGNOSIS — Z85048 Personal history of other malignant neoplasm of rectum, rectosigmoid junction, and anus: Secondary | ICD-10-CM | POA: Insufficient documentation

## 2018-10-04 DIAGNOSIS — E876 Hypokalemia: Secondary | ICD-10-CM | POA: Diagnosis not present

## 2018-10-04 DIAGNOSIS — Z79899 Other long term (current) drug therapy: Secondary | ICD-10-CM | POA: Insufficient documentation

## 2018-10-04 DIAGNOSIS — J449 Chronic obstructive pulmonary disease, unspecified: Secondary | ICD-10-CM | POA: Insufficient documentation

## 2018-10-04 DIAGNOSIS — Z87891 Personal history of nicotine dependence: Secondary | ICD-10-CM | POA: Diagnosis not present

## 2018-10-04 DIAGNOSIS — R0789 Other chest pain: Secondary | ICD-10-CM | POA: Diagnosis not present

## 2018-10-04 DIAGNOSIS — R0600 Dyspnea, unspecified: Secondary | ICD-10-CM | POA: Insufficient documentation

## 2018-10-04 DIAGNOSIS — D649 Anemia, unspecified: Secondary | ICD-10-CM | POA: Insufficient documentation

## 2018-10-04 DIAGNOSIS — R071 Chest pain on breathing: Secondary | ICD-10-CM | POA: Insufficient documentation

## 2018-10-04 DIAGNOSIS — R1010 Upper abdominal pain, unspecified: Secondary | ICD-10-CM | POA: Diagnosis present

## 2018-10-04 DIAGNOSIS — R0781 Pleurodynia: Secondary | ICD-10-CM | POA: Diagnosis not present

## 2018-10-04 LAB — COMPREHENSIVE METABOLIC PANEL
ALT: 10 U/L (ref 0–44)
AST: 13 U/L — ABNORMAL LOW (ref 15–41)
Albumin: 3.6 g/dL (ref 3.5–5.0)
Alkaline Phosphatase: 83 U/L (ref 38–126)
Anion gap: 8 (ref 5–15)
BUN: 11 mg/dL (ref 8–23)
CO2: 28 mmol/L (ref 22–32)
Calcium: 9.5 mg/dL (ref 8.9–10.3)
Chloride: 101 mmol/L (ref 98–111)
Creatinine, Ser: 1.04 mg/dL (ref 0.61–1.24)
GFR calc Af Amer: >60 mL/min (ref >60)
GFR calc non Af Amer: >60 mL/min (ref >60)
Glucose, Bld: 103 mg/dL — ABNORMAL HIGH (ref 70–99)
Potassium: 3.2 mmol/L — ABNORMAL LOW (ref 3.5–5.1)
Sodium: 137 mmol/L (ref 135–145)
Total Bilirubin: 0.7 mg/dL (ref 0.3–1.2)
Total Protein: 7.1 g/dL (ref 6.5–8.1)

## 2018-10-04 LAB — URINALYSIS, ROUTINE W REFLEX MICROSCOPIC
Bilirubin Urine: NEGATIVE
Glucose, UA: NEGATIVE mg/dL
Hgb urine dipstick: NEGATIVE
Ketones, ur: NEGATIVE mg/dL
Leukocytes,Ua: NEGATIVE
Nitrite: NEGATIVE
Protein, ur: NEGATIVE mg/dL
Specific Gravity, Urine: 1.004 — ABNORMAL LOW (ref 1.005–1.030)
pH: 7 (ref 5.0–8.0)

## 2018-10-04 LAB — D-DIMER, QUANTITATIVE: D-Dimer, Quant: <0.27 ug/mL-FEU (ref 0.00–0.50)

## 2018-10-04 LAB — CBC
HCT: 41.1 % (ref 39.0–52.0)
Hemoglobin: 12.6 g/dL — ABNORMAL LOW (ref 13.0–17.0)
MCH: 25.9 pg — ABNORMAL LOW (ref 26.0–34.0)
MCHC: 30.7 g/dL (ref 30.0–36.0)
MCV: 84.6 fL (ref 80.0–100.0)
Platelets: 302 10*3/uL (ref 150–400)
RBC: 4.86 MIL/uL (ref 4.22–5.81)
RDW: 13.5 % (ref 11.5–15.5)
WBC: 7.2 10*3/uL (ref 4.0–10.5)
nRBC: 0 % (ref 0.0–0.2)

## 2018-10-04 LAB — LIPASE, BLOOD: Lipase: 37 U/L (ref 11–51)

## 2018-10-04 MED ORDER — POTASSIUM CHLORIDE CRYS ER 20 MEQ PO TBCR: 20.0000 meq | EXTENDED_RELEASE_TABLET | Freq: Two times a day (BID) | ORAL | 0 refills | Status: DC

## 2018-10-04 MED ORDER — POTASSIUM CHLORIDE CRYS ER 20 MEQ PO TBCR
40.0000 meq | EXTENDED_RELEASE_TABLET | Freq: Once | ORAL | Status: AC
  Administered 2018-10-04: 06:00:00 40 meq via ORAL
  Filled 2018-10-04: qty 2

## 2018-10-04 MED ORDER — KETOROLAC TROMETHAMINE 30 MG/ML IJ SOLN
15.0000 mg | Freq: Once | INTRAMUSCULAR | Status: AC
  Administered 2018-10-04: 15 mg via INTRAVENOUS
  Filled 2018-10-04: qty 1

## 2018-10-04 MED ORDER — SODIUM CHLORIDE 0.9% FLUSH
3.0000 mL | Freq: Once | INTRAVENOUS | Status: AC
  Administered 2018-10-04: 3 mL via INTRAVENOUS

## 2018-10-04 NOTE — Telephone Encounter (Signed)
Appointment scheduled 10/12/2018 with Dr. Warrick Parisian

## 2018-10-04 NOTE — ED Provider Notes (Signed)
[facility] EMERGENCY DEPARTMENT Provider Note   CSN: [id number] Arrival date & time: 10/04/18  0121    History   Chief Complaint Chief Complaint  Patient presents with  . Abdominal Pain    HPI Todd Berg is a 68 y.o. male.   The history is provided by the patient.  He has history of hypertension, hyperlipidemia, COPD, stroke, rectal cancer and comes in because of pain in the left upper abdomen/lower chest.  He describes a sharp pain which is present when he takes a deep breath.  Pain is rated at 4/10.  It has been present for the last 7-8 weeks and not changing over that time.  There is slight dyspnea but no nausea or diaphoresis.  He occasionally has a cough productive of small amount of clear sputum.  He denies fever, chills, sweats.  He denies constipation or diarrhea.  Pain is not affected by eating and not affected by movement.  It is not clear why he chose to come to the hospital tonight, he does says that "they put me in an ambulance".  He is a former smoker.  He denies history of active cancer, travel, recent surgery.  Past Medical History:  Diagnosis Date  . Cancer ([place]) 2015   rectal  . Chronic back pain   . COPD (chronic obstructive pulmonary disease) ([place])   . GERD (gastroesophageal reflux disease)   . Hematemesis   . Hemorrhoids   . Hyperlipidemia   . Hypotension   . Melena   . Melena 10/12/2017  . Stroke [facility])     Patient Active Problem List   Diagnosis Date Noted  . Vitamin B12 deficiency 08/03/2018  . Numbness and tingling of both legs 08/03/2018  . Anemia 10/10/2017  . Fatigue 10/10/2017  . Hypertension 03/21/2017  . Generalized anxiety disorder 03/21/2017  . Degenerative disc disease, lumbar 03/17/2017  . Iron deficiency anemia due to chronic blood loss 11/20/2016  . Chronic back pain   . COPD (chronic obstructive pulmonary disease) ([place])   . GERD (gastroesophageal reflux disease)   . History of rectal cancer 09/13/2015    Past Surgical History:  Procedure Laterality Date  . BACK SURGERY    . BIOPSY  10/13/2017   Procedure: BIOPSY;  Surgeon: Laurence Spates, MD;  Location: [facility] ENDOSCOPY;  Service: Endoscopy;;  . ESOPHAGOGASTRODUODENOSCOPY N/A 06/09/2016   Procedure: ESOPHAGOGASTRODUODENOSCOPY (EGD);  Surgeon: Wilford Corner, MD;  Location: [place];  Service: Endoscopy;  Laterality: N/A;  . ESOPHAGOGASTRODUODENOSCOPY (EGD) WITH PROPOFOL Left 11/21/2016   Procedure: ESOPHAGOGASTRODUODENOSCOPY (EGD) WITH PROPOFOL;  Surgeon: Arta Silence, MD;  Location: [facility] ENDOSCOPY;  Service: Endoscopy;  Laterality: Left;  . ESOPHAGOGASTRODUODENOSCOPY (EGD) WITH PROPOFOL N/A 10/13/2017   Procedure: ESOPHAGOGASTRODUODENOSCOPY (EGD) WITH PROPOFOL;  Surgeon: Laurence Spates, MD;  Location: [place];  Service: Endoscopy;  Laterality: N/A;  . HEMORRHOID SURGERY    . HERNIA REPAIR    . RECTAL SURGERY          Home Medications    Prior to Admission medications   Medication Sig Start Date End Date Taking? Authorizing Provider  amLODipine (NORVASC) 2.5 MG tablet Take 1 tablet (2.5 mg total) by mouth daily. 05/04/18   Dettinger, [name], MD  Cobalamin Combinations (B-12) [phone] MCG SUBL Place 1 tablet under the tongue daily. 08/03/18   Baruch Gouty, FNP  fluticasone (FLONASE) 50 MCG/ACT nasal spray Place 1 spray into both nostrils 2 (two) times daily as needed for allergies or rhinitis. 12/09/17   Hassell Done,  Mary-Margaret, FNP  gabapentin (NEURONTIN) 100 MG capsule Take 1 capsule (100 mg total) by mouth 3 (three) times daily. 09/14/18   Dettinger, [name], MD  nystatin ointment (MYCOSTATIN) Apply 1 application topically 2 (two) times daily. 09/22/18   Dettinger, [name], MD  pantoprazole (PROTONIX) 40 MG tablet Take 1 tablet (40 mg total) by mouth daily. 05/04/18   Dettinger, [name], MD  sildenafil (REVATIO) 20 MG tablet Take 1-3 tablets (20-60 mg total) by mouth as needed. 09/13/18   Dettinger, [name], MD  tetrahydrozoline  0.05 % ophthalmic solution Place 1 drop into both eyes every other day.     [provider]  triamcinolone cream (KENALOG) 0.1 % Apply 1 application topically 2 (two) times daily. 09/22/18   Dettinger, [name], MD    Family History Family History  Problem Relation Age of Onset  . COPD Mother   . Obesity Mother   . Stroke Father     Social History Social History   Tobacco Use  . Smoking status: Former Smoker    Start date: 12/30/1964    Quit date: 06/19/2009    Years since quitting: 9.2  . Smokeless tobacco: Former Systems developer    Types: Snuff    Quit date: 09/26/2005  Substance Use Topics  . Alcohol use: No  . Drug use: No     Allergies   Patient has no known allergies.   Review of Systems Review of Systems  All other systems reviewed and are negative.    Physical Exam Updated Vital Signs BP (!) 186/85 (BP Location: Right Arm)   Pulse 62   Temp 97.8 F (36.6 C) (Oral)   Resp 18   SpO2 98%   Physical Exam Vitals signs and nursing note reviewed.    68 year old male, resting comfortably and in no acute distress. Vital signs are significant for elevated blood pressure. Oxygen saturation is 98%, which is normal. Head is normocephalic and atraumatic. PERRLA, EOMI. Oropharynx is clear. Neck is nontender and supple without adenopathy or JVD. Back is nontender and there is no CVA tenderness. Lungs are clear without rales, wheezes, or rhonchi. Chest is nontender. Heart has regular rate and rhythm without murmur. Abdomen is soft, flat, nontender without masses or hepatosplenomegaly and peristalsis is normoactive. Extremities have no cyanosis or edema, full range of motion is present. Skin is warm and dry without rash. Neurologic: Mental status is normal, cranial nerves are intact, there are no motor or sensory deficits.  ED Treatments / Results  Labs (all labs ordered are listed, but only abnormal results are displayed) Labs Reviewed  COMPREHENSIVE METABOLIC  PANEL - Abnormal; Notable for the following components:      Result Value   Potassium 3.2 (*)    Glucose, Bld 103 (*)    AST 13 (*)    All other components within normal limits  CBC - Abnormal; Notable for the following components:   Hemoglobin 12.6 (*)    MCH 25.9 (*)    All other components within normal limits  URINALYSIS, ROUTINE W REFLEX MICROSCOPIC - Abnormal; Notable for the following components:   Color, Urine STRAW (*)    Specific Gravity, Urine 1.004 (*)    All other components within normal limits  LIPASE, BLOOD  D-DIMER, QUANTITATIVE (NOT AT [facility])    EKG EKG Interpretation  Date/Time:  Wednesday October 04 2018 01:24:51 EDT Ventricular Rate:  53 PR Interval:  190 QRS Duration: 82 QT Interval:  386 QTC Calculation: 362  R Axis:   68 Text Interpretation:  Sinus bradycardia Otherwise normal ECG When compared with ECG of 01/27/2018, No significant change was found Confirmed by Delora Fuel ([id number]) on 10/04/2018 1:48:29 AM   EKG Interpretation  Date/Time:  Wednesday October 04 2018 03:37:12 EDT Ventricular Rate:  57 PR Interval:  186 QRS Duration: 88 QT Interval:  378 QTC Calculation: 367 R Axis:   53 Text Interpretation:  Sinus bradycardia Otherwise normal ECG When compared with ECG of EARLIER SAME DATE No significant change was found Confirmed by Delora Fuel [id number]) on 10/04/2018 5:31:51 AM      Procedures Procedures  Medications Ordered in ED Medications  sodium chloride flush (NS) 0.9 % injection 3 mL (3 mLs Intravenous Given 10/04/18 0518)  ketorolac (TORADOL) 30 MG/ML injection 15 mg (15 mg Intravenous Given 10/04/18 0555)  potassium chloride SA (K-DUR) CR tablet 40 mEq (40 mEq Oral Given 10/04/18 0555)     Initial Impression / Assessment and Plan / ED Course  I have reviewed the triage vital signs and the nursing notes.  Pertinent lab results that were available during my care of the patient were reviewed by me and considered in my medical decision  making (see chart for details).  Pleuritic chest pain of uncertain cause.  Physical exam is benign.  Labs do show a mild hypokalemia and mild anemia which is unchanged from baseline, but are otherwise unremarkable.  Will check d-dimer to rule out pulmonary embolism.  We will also give therapeutic trial of ketorolac.  Old records are reviewed, and he was seen in emergency for pleuritic chest pain last January which apparently was from GI origin.  He also has ED visits for upper GI bleed.  D-dimer is come back normal.  He had excellent relief of pain with ketorolac.  Patient is given reassurance and told to cautiously use over-the-counter NSAIDs as needed for pain.  Follow-up with PCP.  Final Clinical Impressions(s) / ED Diagnoses   Final diagnoses:  Pleuritic chest pain  Normochromic normocytic anemia  Hypokalemia    ED Discharge Orders         Ordered    potassium chloride SA (K-DUR) 20 MEQ tablet  2 times daily     10/04/18 [id number]           Delora Fuel, MD [phone]

## 2018-10-04 NOTE — ED Notes (Signed)
Patient verbalizes understanding of discharge instructions. Opportunity for questioning and answers were provided. Armband removed by staff, pt discharged from ED.  

## 2018-10-04 NOTE — Discharge Instructions (Addendum)
Take ibuprofen or naproxen as needed for pain.  Return if symptoms are getting worse.

## 2018-10-04 NOTE — ED Triage Notes (Signed)
Per pt he was at [facility] today at about 5pm pt started having LUQ pain that does not radiate. Pt has had no nausea, no vomiting but does hurts more when he takes a deep breathe. Pt said no chest pain, No fevers, no chills

## 2018-10-12 ENCOUNTER — Encounter: Payer: Self-pay | Admitting: Family Medicine

## 2018-10-12 ENCOUNTER — Other Ambulatory Visit: Payer: Self-pay

## 2018-10-12 ENCOUNTER — Ambulatory Visit (INDEPENDENT_AMBULATORY_CARE_PROVIDER_SITE_OTHER): Payer: Medicare Other | Admitting: Family Medicine

## 2018-10-12 VITALS — BP 137/77 | HR 60 | Temp 98.6°F | Ht 73.0 in | Wt 202.0 lb

## 2018-10-12 DIAGNOSIS — R0781 Pleurodynia: Secondary | ICD-10-CM | POA: Diagnosis not present

## 2018-10-12 DIAGNOSIS — Z23 Encounter for immunization: Secondary | ICD-10-CM

## 2018-10-12 NOTE — Progress Notes (Signed)
BP 137/77   Pulse 60   Temp 98.6 F (37 C) (Temporal)   Ht '6\' 1"'  (1.854 m)   Wt 202 lb (91.6 kg)   SpO2 98%   BMI 26.65 kg/m    Subjective:   Patient ID: Todd Berg, male    DOB: 1950/05/24, 68 y.o.   MRN: [id number]  HPI: Todd Berg is a 68 y.o. male presenting on 10/12/2018 for ER Follow up (9/23- [facility] chest and abd pain  )   HPI Patient is coming in for hospital follow-up for chest pain and upper abdominal pain.  He says he was having some lower rib/chest pain on the day that he went to the emergency department. Patient was in ED 10/04/2018 and has had no pain since leaving.  The pain was on inspiration.   Relevant past medical, surgical, family and social history reviewed and updated as indicated. Interim medical history since our last visit reviewed. Allergies and medications reviewed and updated.  Review of Systems  Constitutional: Negative for chills and fever.  Respiratory: Negative for shortness of breath and wheezing.   Cardiovascular: Negative for chest pain and leg swelling.  Gastrointestinal: Negative for abdominal pain.  Musculoskeletal: Negative for back pain and gait problem.  Skin: Negative for rash.  Neurological: Negative for dizziness, weakness and light-headedness.  All other systems reviewed and are negative.   Per HPI unless specifically indicated above   Allergies as of 10/12/2018   No Known Allergies     Medication List       Accurate as of October 12, 2018  9:20 AM. If you have any questions, ask your nurse or doctor.        amLODipine 2.5 MG tablet Commonly known as: NORVASC Take 1 tablet (2.5 mg total) by mouth daily.   B-12 [id number] MCG Subl Place 1 tablet under the tongue daily.   fluticasone 50 MCG/ACT nasal spray Commonly known as: FLONASE Place 1 spray into both nostrils 2 (two) times daily as needed for allergies or rhinitis.   gabapentin 100 MG capsule Commonly known as: NEURONTIN Take 1 capsule (100 mg total) by  mouth 3 (three) times daily.   nystatin ointment Commonly known as: MYCOSTATIN Apply 1 application topically 2 (two) times daily.   pantoprazole 40 MG tablet Commonly known as: PROTONIX Take 1 tablet (40 mg total) by mouth daily.   potassium chloride SA 20 MEQ tablet Commonly known as: KLOR-CON Take 1 tablet (20 mEq total) by mouth 2 (two) times daily.   sildenafil 20 MG tablet Commonly known as: REVATIO Take 1-3 tablets (20-60 mg total) by mouth as needed.   tetrahydrozoline 0.05 % ophthalmic solution Place 1 drop into both eyes every other day.   triamcinolone cream 0.1 % Commonly known as: KENALOG Apply 1 application topically 2 (two) times daily.        Objective:   BP 137/77   Pulse 60   Temp 98.6 F (37 C) (Temporal)   Ht '6\' 1"'  (1.854 m)   Wt 202 lb (91.6 kg)   SpO2 98%   BMI 26.65 kg/m   Wt Readings from Last 3 Encounters:  10/12/18 202 lb (91.6 kg)  09/22/18 208 lb 12.8 oz (94.7 kg)  09/13/18 204 lb 12.8 oz (92.9 kg)    Physical Exam Vitals signs and nursing note reviewed.  Constitutional:      General: He is not in acute distress.    Appearance: He is well-developed. He is not diaphoretic.  Eyes:     General: No scleral icterus.    Conjunctiva/sclera: Conjunctivae normal.  Neck:     Musculoskeletal: Neck supple.     Thyroid: No thyromegaly.  Cardiovascular:     Rate and Rhythm: Normal rate and regular rhythm.     Heart sounds: Normal heart sounds. No murmur.  Pulmonary:     Effort: Pulmonary effort is normal. No respiratory distress.     Breath sounds: Normal breath sounds. No wheezing.  Lymphadenopathy:     Cervical: No cervical adenopathy.  Skin:    General: Skin is warm and dry.     Findings: No rash.  Neurological:     Mental Status: He is alert and oriented to person, place, and time.     Coordination: Coordination normal.  Psychiatric:        Behavior: Behavior normal.       Assessment & Plan:   Problem List Items Addressed  This Visit    None    Visit Diagnoses    Pleuritic chest pain    -  Primary   Relevant Orders   CBC with Differential/Platelet   BMP8+EGFR      Patient's pain has not come back since leaving the hospital is doing very well, likely muscular. Follow up plan: Return if symptoms worsen or fail to improve.  Counseling provided for all of the vaccine components Orders Placed This Encounter  Procedures  . CBC with Differential/Platelet  . BMP8+EGFR    Caryl Pina, MD [facility] 10/12/2018, 9:20 AM

## 2018-10-31 ENCOUNTER — Ambulatory Visit (HOSPITAL_COMMUNITY): Payer: Medicare Other

## 2018-10-31 ENCOUNTER — Other Ambulatory Visit: Payer: Self-pay

## 2018-10-31 ENCOUNTER — Ambulatory Visit (INDEPENDENT_AMBULATORY_CARE_PROVIDER_SITE_OTHER): Payer: Medicare Other | Admitting: Family

## 2018-10-31 ENCOUNTER — Encounter (HOSPITAL_COMMUNITY): Payer: Self-pay | Admitting: *Deleted

## 2018-10-31 ENCOUNTER — Emergency Department (HOSPITAL_COMMUNITY)
Admission: EM | Admit: 2018-10-31 | Discharge: 2018-10-31 | Disposition: A | Discharge status: Home / Self Care | Payer: Medicare Other | Attending: Emergency Medicine | Admitting: Emergency Medicine

## 2018-10-31 ENCOUNTER — Emergency Department (HOSPITAL_COMMUNITY): Payer: Medicare Other

## 2018-10-31 ENCOUNTER — Encounter: Payer: Self-pay | Admitting: Family

## 2018-10-31 VITALS — BP 141/77 | HR 55 | Temp 98.2°F | Ht 72.0 in | Wt 204.4 lb

## 2018-10-31 DIAGNOSIS — R1011 Right upper quadrant pain: Secondary | ICD-10-CM

## 2018-10-31 DIAGNOSIS — R399 Unspecified symptoms and signs involving the genitourinary system: Secondary | ICD-10-CM | POA: Insufficient documentation

## 2018-10-31 DIAGNOSIS — I10 Essential (primary) hypertension: Secondary | ICD-10-CM | POA: Insufficient documentation

## 2018-10-31 DIAGNOSIS — Z79899 Other long term (current) drug therapy: Secondary | ICD-10-CM | POA: Insufficient documentation

## 2018-10-31 DIAGNOSIS — Z8673 Personal history of transient ischemic attack (TIA), and cerebral infarction without residual deficits: Secondary | ICD-10-CM | POA: Diagnosis not present

## 2018-10-31 DIAGNOSIS — J449 Chronic obstructive pulmonary disease, unspecified: Secondary | ICD-10-CM | POA: Diagnosis not present

## 2018-10-31 DIAGNOSIS — R93421 Abnormal radiologic findings on diagnostic imaging of right kidney: Secondary | ICD-10-CM | POA: Diagnosis not present

## 2018-10-31 DIAGNOSIS — R109 Unspecified abdominal pain: Secondary | ICD-10-CM

## 2018-10-31 DIAGNOSIS — Z87891 Personal history of nicotine dependence: Secondary | ICD-10-CM | POA: Insufficient documentation

## 2018-10-31 LAB — COMPREHENSIVE METABOLIC PANEL
ALT: 10 U/L (ref 0–44)
AST: 12 U/L — ABNORMAL LOW (ref 15–41)
Albumin: 3.7 g/dL (ref 3.5–5.0)
Alkaline Phosphatase: 74 U/L (ref 38–126)
Anion gap: 8 (ref 5–15)
BUN: 14 mg/dL (ref 8–23)
CO2: 26 mmol/L (ref 22–32)
Calcium: 8.9 mg/dL (ref 8.9–10.3)
Chloride: 102 mmol/L (ref 98–111)
Creatinine, Ser: 1.02 mg/dL (ref 0.61–1.24)
GFR calc Af Amer: >60 mL/min (ref >60)
GFR calc non Af Amer: >60 mL/min (ref >60)
Glucose, Bld: 95 mg/dL (ref 70–99)
Potassium: 3.4 mmol/L — ABNORMAL LOW (ref 3.5–5.1)
Sodium: 136 mmol/L (ref 135–145)
Total Bilirubin: 0.4 mg/dL (ref 0.3–1.2)
Total Protein: 7.2 g/dL (ref 6.5–8.1)

## 2018-10-31 LAB — CBC WITH DIFFERENTIAL/PLATELET
Abs Immature Granulocytes: 0.02 10*3/uL (ref 0.00–0.07)
Basophils Absolute: 0.1 10*3/uL (ref 0.0–0.1)
Basophils Relative: 1 %
Eosinophils Absolute: 0.1 10*3/uL (ref 0.0–0.5)
Eosinophils Relative: 1 %
HCT: 43.2 % (ref 39.0–52.0)
Hemoglobin: 12.9 g/dL — ABNORMAL LOW (ref 13.0–17.0)
Immature Granulocytes: 0 %
Lymphocytes Relative: 22 %
Lymphs Abs: 1.4 10*3/uL (ref 0.7–4.0)
MCH: 25.4 pg — ABNORMAL LOW (ref 26.0–34.0)
MCHC: 29.9 g/dL — ABNORMAL LOW (ref 30.0–36.0)
MCV: 85.2 fL (ref 80.0–100.0)
Monocytes Absolute: 0.5 10*3/uL (ref 0.1–1.0)
Monocytes Relative: 8 %
Neutro Abs: 4.4 10*3/uL (ref 1.7–7.7)
Neutrophils Relative %: 68 %
Platelets: 286 10*3/uL (ref 150–400)
RBC: 5.07 MIL/uL (ref 4.22–5.81)
RDW: 13.5 % (ref 11.5–15.5)
WBC: 6.5 10*3/uL (ref 4.0–10.5)
nRBC: 0 % (ref 0.0–0.2)

## 2018-10-31 LAB — URINALYSIS, ROUTINE W REFLEX MICROSCOPIC
Bilirubin Urine: NEGATIVE
Glucose, UA: NEGATIVE mg/dL
Hgb urine dipstick: NEGATIVE
Ketones, ur: NEGATIVE mg/dL
Leukocytes,Ua: NEGATIVE
Nitrite: NEGATIVE
Protein, ur: NEGATIVE mg/dL
Specific Gravity, Urine: 1.018 (ref 1.005–1.030)
pH: 6 (ref 5.0–8.0)

## 2018-10-31 LAB — LIPASE, BLOOD: Lipase: 34 U/L (ref 11–51)

## 2018-10-31 MED ORDER — MORPHINE SULFATE (PF) 4 MG/ML IV SOLN
4.0000 mg | Freq: Once | INTRAVENOUS | Status: AC
  Administered 2018-10-31: 4 mg via INTRAVENOUS
  Filled 2018-10-31: qty 1

## 2018-10-31 MED ORDER — KETOROLAC TROMETHAMINE 30 MG/ML IJ SOLN
30.0000 mg | Freq: Once | INTRAMUSCULAR | Status: AC
  Administered 2018-10-31: 17:00:00 30 mg via INTRAVENOUS
  Filled 2018-10-31: qty 1

## 2018-10-31 MED ORDER — ACETAMINOPHEN 500 MG PO TABS: 500.0000 mg | ORAL_TABLET | Freq: Four times a day (QID) | ORAL | 0 refills | Status: DC | PRN

## 2018-10-31 MED ORDER — IOHEXOL 9 MG/ML PO SOLN
ORAL | Status: AC
  Filled 2018-10-31: qty 1000

## 2018-10-31 MED ORDER — IOHEXOL 300 MG/ML  SOLN
100.0000 mL | Freq: Once | INTRAMUSCULAR | Status: AC | PRN
  Administered 2018-10-31: 100 mL via INTRAVENOUS

## 2018-10-31 NOTE — Progress Notes (Signed)
Subjective:    Patient ID: Todd Berg, male    DOB: [birth year], 68 y.o.   MRN: [id number]  Chief Complaint  Patient presents with  . right abdomen pain    constant pain for five or six days    Abdominal Pain This is a new problem. The current episode started in the past 7 days. The onset quality is gradual. The problem occurs constantly. The problem has been gradually worsening. The pain is located in the RUQ. The pain is at a severity of 10/10. The pain is moderate. The quality of the pain is aching. The abdominal pain does not radiate. Pertinent negatives include no arthralgias, belching, constipation, diarrhea, dysuria, fever, flatus, frequency, hematuria, nausea or vomiting. The pain is aggravated by movement. The pain is relieved by being still. He has tried nothing for the symptoms. The treatment provided no relief.      Review of Systems  Constitutional: Negative for fever.  Gastrointestinal: Positive for abdominal pain. Negative for constipation, diarrhea, flatus, nausea and vomiting.  Genitourinary: Negative for dysuria, frequency and hematuria.  Musculoskeletal: Negative for arthralgias.  All other systems reviewed and are negative.      Objective:   Physical Exam Vitals signs reviewed.  Constitutional:      General: He is not in acute distress.    Appearance: He is well-developed.  HENT:     Head: Normocephalic.     Right Ear: External ear normal.     Left Ear: External ear normal.  Eyes:     General:        Right eye: No discharge.        Left eye: No discharge.     Pupils: Pupils are equal, round, and reactive to light.  Neck:     Musculoskeletal: Normal range of motion and neck supple.     Thyroid: No thyromegaly.  Cardiovascular:     Rate and Rhythm: Normal rate and regular rhythm.     Heart sounds: Normal heart sounds. No murmur.  Pulmonary:     Effort: Pulmonary effort is normal. No respiratory distress.     Breath sounds: Normal breath sounds.  No wheezing.  Abdominal:     General: Bowel sounds are normal. There is distension.     Palpations: Abdomen is soft.     Tenderness: There is abdominal tenderness (RUQ).  Musculoskeletal: Normal range of motion.        General: No tenderness.  Skin:    General: Skin is warm and dry.     Findings: No erythema or rash.  Neurological:     Mental Status: He is alert and oriented to person, place, and time.     Cranial Nerves: No cranial nerve deficit.     Deep Tendon Reflexes: Reflexes are normal and symmetric.  Psychiatric:        Behavior: Behavior normal.        Thought Content: Thought content normal.        Judgment: Judgment normal.          BP (!) 141/77   Pulse (!) 55   Temp 98.2 F (36.8 C) (Temporal)   Ht 6' (1.829 m)   Wt 204 lb 6.4 oz (92.7 kg)   SpO2 100%   BMI 27.72 kg/m   Assessment & Plan:  Todd Berg comes in today with chief complaint of right abdomen pain (constant pain for five or six days)   Diagnosis and orders addressed:  1. RUQ pain -  CT Abdomen Pelvis W Contrast; Future - BMP8+EGFR - CBC with Differential/Platelet - Hepatic function panel  2. Right upper quadrant pain - CT Abdomen Pelvis W Contrast; Future  Labs pending  CT scan pending If pain worsens go to ED Do not eat or drink anything until cleared   Evelina Dun, FNP

## 2018-10-31 NOTE — Discharge Instructions (Signed)
Your work-up today was reassuring.  You did have an abnormality on your right kidney that should be followed up by a urologist.  I have given you their information.  Please call them tomorrow morning to set up a follow-up appointment.  Your gallbladder may have a stone in it but I do not think that you are having an infection involving your gallbladder today.  Your primary care doctor may want to do an ultrasound of the right upper quadrant of the abdomen to further evaluate for this.  Please call them tomorrow and let them know that you had a CT scan done so you no longer need this, but if they think you need an ultrasound they can order it.  You can take 1 to 2 tablets of Tylenol (350mg -1000mg  depending on the dose) every 6 hours as needed for pain.  Do not exceed 4000 mg of Tylenol daily.  If your pain persists you can take a doses of ibuprofen in between doses of Tylenol.  I usually recommend 400 to 600 mg of ibuprofen every 6 hours.  Take this with food to avoid upset stomach issues.  Drink plenty of fluids and get plenty of rest.  Return to the emergency department if any concerning signs or symptoms develop such as fevers, worsening pain, persistent vomiting, chest pain, or shortness of breath.

## 2018-10-31 NOTE — ED Provider Notes (Signed)
[facility] EMERGENCY DEPARTMENT Provider Note   CSN: [id number] Arrival date & time: 10/31/18  1211     History   Chief Complaint Chief Complaint  Patient presents with   Flank Pain    right    HPI Todd Berg is a 68 y.o. male with history of COPD, chronic back pain, GERD, hyperlipidemia presents for evaluation of acute onset, persistent right upper quadrant/flank pain for 6 days.  No known trauma or bending lifting or twisting injury.  Notes pain is constant, sore, worsens with palpation, certain movements.  Denies chest pain, shortness of breath, fever, or cough.  No urinary symptoms, diarrhea, constipation, melena, hematochezia, nausea, or vomiting.  Has not tried anything for his symptoms.  Went to his PCP today who ordered outpatient blood work and CT scan and recommended presentation to the ED if his symptoms worsen.       The history is provided by the patient.    Past Medical History:  Diagnosis Date   Cancer ([place]) 2015   rectal   Chronic back pain    COPD (chronic obstructive pulmonary disease) (HCC)    GERD (gastroesophageal reflux disease)    Hematemesis    Hemorrhoids    Hyperlipidemia    Hypotension    Melena    Melena 10/12/2017   Stroke [facility])     Patient Active Problem List   Diagnosis Date Noted   Vitamin B12 deficiency 08/03/2018   Numbness and tingling of both legs 08/03/2018   Anemia 10/10/2017   Fatigue 10/10/2017   Hypertension 03/21/2017   Generalized anxiety disorder 03/21/2017   Degenerative disc disease, lumbar 03/17/2017   Iron deficiency anemia due to chronic blood loss 11/20/2016   Chronic back pain    COPD (chronic obstructive pulmonary disease) (HCC)    GERD (gastroesophageal reflux disease)    History of rectal cancer 09/13/2015    Past Surgical History:  Procedure Laterality Date   BACK SURGERY     BIOPSY  10/13/2017   Procedure: BIOPSY;  Surgeon: Laurence Spates, MD;  Location: [place];   Service: Endoscopy;;   ESOPHAGOGASTRODUODENOSCOPY N/A 06/09/2016   Procedure: ESOPHAGOGASTRODUODENOSCOPY (EGD);  Surgeon: Wilford Corner, MD;  Location: [place];  Service: Endoscopy;  Laterality: N/A;   ESOPHAGOGASTRODUODENOSCOPY (EGD) WITH PROPOFOL Left 11/21/2016   Procedure: ESOPHAGOGASTRODUODENOSCOPY (EGD) WITH PROPOFOL;  Surgeon: Arta Silence, MD;  Location: [facility] ENDOSCOPY;  Service: Endoscopy;  Laterality: Left;   ESOPHAGOGASTRODUODENOSCOPY (EGD) WITH PROPOFOL N/A 10/13/2017   Procedure: ESOPHAGOGASTRODUODENOSCOPY (EGD) WITH PROPOFOL;  Surgeon: Laurence Spates, MD;  Location: [place];  Service: Endoscopy;  Laterality: N/A;   HEMORRHOID SURGERY     HERNIA REPAIR     RECTAL SURGERY          Home Medications    Prior to Admission medications   Medication Sig Start Date End Date Taking? Authorizing Provider  acetaminophen (TYLENOL) 500 MG tablet Take 1 tablet (500 mg total) by mouth every 6 (six) hours as needed. 10/31/18   Kayo Zion A, PA-C  amLODipine (NORVASC) 2.5 MG tablet Take 1 tablet (2.5 mg total) by mouth daily. 05/04/18   Dettinger, [name], MD  Cobalamin Combinations (B-12) [phone] MCG SUBL Place 1 tablet under the tongue daily. 08/03/18   Baruch Gouty, FNP  fluticasone (FLONASE) 50 MCG/ACT nasal spray Place 1 spray into both nostrils 2 (two) times daily as needed for allergies or rhinitis. 12/09/17   Hassell Done, Mary-Margaret, FNP  gabapentin (NEURONTIN) 100 MG capsule Take 1 capsule (100 mg  total) by mouth 3 (three) times daily. 09/14/18   Dettinger, [name], MD  nystatin ointment (MYCOSTATIN) Apply 1 application topically 2 (two) times daily. Patient not taking: Reported on 10/31/2018 09/22/18   Dettinger, [name], MD  pantoprazole (PROTONIX) 40 MG tablet Take 1 tablet (40 mg total) by mouth daily. 05/04/18   Dettinger, [name], MD  potassium chloride SA (K-DUR) 20 MEQ tablet Take 1 tablet (20 mEq total) by mouth 2 (two) times daily. [id number]   Delora Fuel,  MD  sildenafil (REVATIO) 20 MG tablet Take 1-3 tablets (20-60 mg total) by mouth as needed. 09/13/18   Dettinger, [name], MD  tetrahydrozoline 0.05 % ophthalmic solution Place 1 drop into both eyes every other day.     [provider]  triamcinolone cream (KENALOG) 0.1 % Apply 1 application topically 2 (two) times daily. 09/22/18   Dettinger, [name], MD    Family History Family History  Problem Relation Age of Onset   COPD Mother    Obesity Mother    Stroke Father     Social History Social History   Tobacco Use   Smoking status: Former Smoker    Start date: 12/30/1964    Quit date: 06/19/2009    Years since quitting: 9.3   Smokeless tobacco: Former Systems developer    Types: Snuff    Quit date: 09/26/2005  Substance Use Topics   Alcohol use: No   Drug use: No     Allergies   Patient has no known allergies.   Review of Systems Review of Systems  Constitutional: Negative for chills and fever.  Respiratory: Negative for cough and shortness of breath.   Cardiovascular: Negative for chest pain.  Gastrointestinal: Positive for abdominal pain. Negative for constipation, diarrhea, nausea and vomiting.  Genitourinary: Positive for flank pain. Negative for dysuria, frequency, hematuria and urgency.  All other systems reviewed and are negative.    Physical Exam Updated Vital Signs BP (!) 173/86 (BP Location: Left Arm)    Pulse (!) 56    Temp 97.8 F (36.6 C) (Oral)    Resp 17    Ht 6' (1.829 m)    Wt 41.7 kg    SpO2 99%    BMI 12.48 kg/m   Physical Exam Vitals signs and nursing note reviewed.  Constitutional:      General: He is not in acute distress.    Appearance: He is well-developed.  HENT:     Head: Normocephalic and atraumatic.  Eyes:     General:        Right eye: No discharge.        Left eye: No discharge.     Conjunctiva/sclera: Conjunctivae normal.  Neck:     Vascular: No JVD.     Trachea: No tracheal deviation.  Cardiovascular:     Rate and  Rhythm: Normal rate and regular rhythm.  Pulmonary:     Effort: Pulmonary effort is normal.     Breath sounds: Normal breath sounds.  Chest:     Chest wall: Tenderness present.       Comments: No deformity, crepitus, ecchymosis, or flail segment. Abdominal:     General: Bowel sounds are normal. There is no distension.     Palpations: Abdomen is soft.     Tenderness: There is abdominal tenderness in the right upper quadrant. There is no right CVA tenderness, left CVA tenderness, guarding or rebound. Negative signs include Murphy's sign.  Skin:    General: Skin is warm  and dry.     Findings: No erythema.  Neurological:     Mental Status: He is alert.  Psychiatric:        Behavior: Behavior normal.      ED Treatments / Results  Labs (all labs ordered are listed, but only abnormal results are displayed) Labs Reviewed  CBC WITH DIFFERENTIAL/PLATELET - Abnormal; Notable for the following components:      Result Value   Hemoglobin 12.9 (*)    MCH 25.4 (*)    MCHC 29.9 (*)    All other components within normal limits  COMPREHENSIVE METABOLIC PANEL - Abnormal; Notable for the following components:   Potassium 3.4 (*)    AST 12 (*)    All other components within normal limits  LIPASE, BLOOD  URINALYSIS, ROUTINE W REFLEX MICROSCOPIC    EKG None  Radiology Ct Abdomen Pelvis W Contrast  Result Date: 10/31/2018 CLINICAL DATA:  Right upper quadrant pain, cholecystitis suspected. EXAM: CT ABDOMEN AND PELVIS WITH CONTRAST TECHNIQUE: Multidetector CT imaging of the abdomen and pelvis was performed using the standard protocol following bolus administration of intravenous contrast. CONTRAST:  174mL OMNIPAQUE IOHEXOL 300 MG/ML  SOLN COMPARISON:  11/20/2016 FINDINGS: Lower chest: No sign of consolidation. Basilar atelectasis, no pleural or pericardial effusion. Heart is incompletely imaged. Hepatobiliary: Small low-density lesion in the medial section left hepatic lobe likely small cysts.  No signs of suspicious mass or acute finding related to the liver biliary tree. Specifically, the gallbladder displays no distension, pericholecystic stranding or pericholecystic fluid. Potential small stone versus polyp in the neck of the gallbladder. Pancreas: Unremarkable. No pancreatic ductal dilatation or surrounding inflammatory changes. Spleen: Normal in size without focal abnormality. Adrenals/Urinary Tract: Adrenal glands are normal. Tiny exophytic renal lesion on the right measures 7 mm (image 26, series 2) and shows a density of approximately 94 Hounsfield units though density values are difficult to interpret given small size. Left kidney with small cysts which are unchanged. No signs of hydronephrosis. Stomach/Bowel: Normal appendix. No signs of bowel obstruction or acute bowel process. Vascular/Lymphatic: Patent abdominal vasculature. No signs of significant atherosclerotic change or aneurysm. No signs of adenopathy in the upper abdomen or retroperitoneum. No signs of pelvic adenopathy. Reproductive: Prostate heterogeneity, nonspecific by CT. Urinary bladder is unremarkable. Other: No ascites, free air or hernia. Musculoskeletal: No acute bone finding or destructive bone process. Signs of L5-S1 spinal fusion are similar to prior exam. IMPRESSION: 1. No cause for the patient's right upper quadrant pain identified. Normal appendix and normal gallbladder. 2. Potential small solid renal neoplasm versus is hyperdense renal cyst arising from upper pole of right kidney appears more discrete than on the study of 2018, suggest dedicated renal CT evaluation on follow-up. 3. Potential small stone versus polyp in the neck of the gallbladder. Ultrasound could better evaluate if clinically warranted. These results were called by telephone at the time of interpretation on 10/31/2018 at 4:27 pm to provider [facility] , who verbally acknowledged these results. Electronically Signed   By: Zetta Bills M.D.   On:  10/31/2018 16:28    Procedures Procedures (including critical care time)  Medications Ordered in ED Medications  morphine 4 MG/ML injection 4 mg (4 mg Intravenous Given 10/31/18 1345)  iohexol (OMNIPAQUE) 300 MG/ML solution 100 mL (100 mLs Intravenous Contrast Given 10/31/18 1556)  ketorolac (TORADOL) 30 MG/ML injection 30 mg (30 mg Intravenous Given 10/31/18 1639)     Initial Impression / Assessment and Plan / ED Course  I  have reviewed the triage vital signs and the nursing notes.  Pertinent labs & imaging results that were available during my care of the patient were reviewed by me and considered in my medical decision making (see chart for details).        Patient presenting for evaluation of right flank pain.  Seen by his PCP earlier today who ordered outpatient lab work and imaging but he came to the ED for work-up.  He is afebrile, intermittently hypertensive but did not take his blood pressure medications today.  Blood pressure is at patient's baseline.  Abdomen is soft, no peritoneal signs.  Lab work today reviewed by me shows no leukocytosis, no anemia, no metabolic derangements, no renal insufficiency.  Lipase and LFTs are within normal limits.  UA does not suggest UTI or nephrolithiasis.  CT scan of the abdomen and pelvis shows a potential small solid renal neoplasm versus hyperdense renal cyst arising from the upper pole of the right kidney.  I do not think this is causing the patient's pain.  I did inform him of this abnormal finding and he knows to follow-up with urology on an outpatient basis for further evaluation and management.  There is also evidence of a potential small stone versus polyp in the neck of the gallbladder.  On review of previous imaging this has been present since at least November 20, 2016.  He has no signs of cholecystitis on imaging today, Murphy sign absent on physical examination.  On reevaluation he is resting comfortably in no apparent distress.  His  pain has resolved and serial abdominal examinations are benign.  He is tolerating p.o. fluids without difficulty.  LFTs are again within normal limits.  I do not think that he requires emergent ultrasound at this time as clinical suspicion for acute cholecystitis/ascending cholangitis, or choledocholithiasis is low.  He would follow-up with his PCP or gastroenterology for reevaluation and possible outpatient ultrasound.  Discussed strict ED return precautions.  Patient and wife verbalized understanding of and agreement with plan and patient stable for discharge home at this time.  Final Clinical Impressions(s) / ED Diagnoses   Final diagnoses:  Right flank pain  RUQ abdominal pain  Abnormal finding of kidney    ED Discharge Orders         Ordered    acetaminophen (TYLENOL) 500 MG tablet  Every 6 hours PRN     10/31/18 1733           Renita Papa, PA-C 11/01/18 1606    Noemi Chapel, MD 11/04/18 [phone]

## 2018-10-31 NOTE — ED Triage Notes (Signed)
Patient had been seen by his PCP today for a general check up.  Patient was advised to come the ED after informing his PCP of right sided flank pain for 10 days.  Patient denies urinary symptoms.

## 2018-11-01 ENCOUNTER — Telehealth: Payer: Self-pay | Admitting: *Deleted

## 2018-11-01 ENCOUNTER — Other Ambulatory Visit: Payer: Self-pay | Admitting: *Deleted

## 2018-11-01 DIAGNOSIS — R1011 Right upper quadrant pain: Secondary | ICD-10-CM

## 2018-11-01 LAB — BMP8+EGFR
BUN/Creatinine Ratio: 10 (ref 10–24)
BUN: 11 mg/dL (ref 8–27)
CO2: 23 mmol/L (ref 20–29)
Calcium: 9.1 mg/dL (ref 8.6–10.2)
Chloride: 105 mmol/L (ref 96–106)
Creatinine, Ser: 1.12 mg/dL (ref 0.76–1.27)
GFR calc Af Amer: 77 mL/min/{1.73_m2} (ref >59)
GFR calc non Af Amer: 67 mL/min/{1.73_m2} (ref >59)
Glucose: 93 mg/dL (ref 65–99)
Potassium: 4 mmol/L (ref 3.5–5.2)
Sodium: 143 mmol/L (ref 134–144)

## 2018-11-01 LAB — CBC WITH DIFFERENTIAL/PLATELET
Basophils Absolute: 0.1 10*3/uL (ref 0.0–0.2)
Basos: 1 %
EOS (ABSOLUTE): 0.1 10*3/uL (ref 0.0–0.4)
Eos: 1 %
Hematocrit: 40.2 % (ref 37.5–51.0)
Hemoglobin: 12.7 g/dL — ABNORMAL LOW (ref 13.0–17.7)
Immature Grans (Abs): 0 10*3/uL (ref 0.0–0.1)
Immature Granulocytes: 0 %
Lymphocytes Absolute: 1.4 10*3/uL (ref 0.7–3.1)
Lymphs: 21 %
MCH: 26.3 pg — ABNORMAL LOW (ref 26.6–33.0)
MCHC: 31.6 g/dL (ref 31.5–35.7)
MCV: 83 fL (ref 79–97)
Monocytes Absolute: 0.5 10*3/uL (ref 0.1–0.9)
Monocytes: 7 %
Neutrophils Absolute: 4.6 10*3/uL (ref 1.4–7.0)
Neutrophils: 70 %
Platelets: 269 10*3/uL (ref 150–450)
RBC: 4.82 x10E6/uL (ref 4.14–5.80)
RDW: 13.5 % (ref 11.6–15.4)
WBC: 6.7 10*3/uL (ref 3.4–10.8)

## 2018-11-01 LAB — HEPATIC FUNCTION PANEL
ALT: 7 IU/L (ref 0–44)
AST: 10 IU/L (ref 0–40)
Albumin: 3.7 g/dL — ABNORMAL LOW (ref 3.8–4.8)
Alkaline Phosphatase: 96 IU/L (ref 39–117)
Bilirubin Total: 0.3 mg/dL (ref 0.0–1.2)
Bilirubin, Direct: 0.1 mg/dL (ref 0.00–0.40)
Total Protein: 6.7 g/dL (ref 6.0–8.5)

## 2018-11-01 NOTE — Telephone Encounter (Signed)
Patient does need an appointment with me but it does look like the CT showed a possible gallstone, go ahead and order a right upper quadrant abdominal ultrasound for diagnosis gallstone

## 2018-11-01 NOTE — Telephone Encounter (Signed)
Wife calls to schedule an appointment with pcp.  She says Todd Berg needs a follow up due to his completed CT of abdomen.   Appointment was scheduled but he may need an [place] before being seen again.  Please review and advise .

## 2018-11-02 ENCOUNTER — Ambulatory Visit (INDEPENDENT_AMBULATORY_CARE_PROVIDER_SITE_OTHER): Payer: Medicare Other | Admitting: Family Medicine

## 2018-11-02 ENCOUNTER — Encounter: Payer: Self-pay | Admitting: Family Medicine

## 2018-11-02 VITALS — BP 138/77 | HR 64 | Temp 99.1°F | Ht 72.0 in | Wt 203.4 lb

## 2018-11-02 DIAGNOSIS — N2889 Other specified disorders of kidney and ureter: Secondary | ICD-10-CM

## 2018-11-02 DIAGNOSIS — R1011 Right upper quadrant pain: Secondary | ICD-10-CM | POA: Diagnosis not present

## 2018-11-02 DIAGNOSIS — K8000 Calculus of gallbladder with acute cholecystitis without obstruction: Secondary | ICD-10-CM

## 2018-11-02 DIAGNOSIS — N289 Disorder of kidney and ureter, unspecified: Secondary | ICD-10-CM | POA: Diagnosis not present

## 2018-11-02 NOTE — Progress Notes (Signed)
BP 138/77   Pulse 64   Temp 99.1 F (37.3 C) (Temporal)   Ht 6' (1.829 m)   Wt 203 lb 6.4 oz (92.3 kg)   BMI 27.59 kg/m    Subjective:   Patient ID: Todd Berg, male    DOB: 18-Jul-1950, 68 y.o.   MRN: [id number]  HPI: Todd Berg is a 68 y.o. male presenting on 11/02/2018 for No chief complaint on file.   HPI Patient is coming in complaining of right upper quadrant abdominal pain which took him to the emergency department on 10/31/2018, patient had a CT scan which showed possible gallstone and he is having right upper quadrant pain and and suspected for cholecystitis.  He says the pain is still on that right upper side and has been persistent since he was in the hospital couple days ago and has not been severe but has been steady.  It also was noted to have a renal lesion or mass on the CT scan and wanted a dedicated renal study, we have ordered this.  He denies any urinary issues.  Relevant past medical, surgical, family and social history reviewed and updated as indicated. Interim medical history since our last visit reviewed. Allergies and medications reviewed and updated.  Review of Systems  Constitutional: Negative for chills and fever.  Respiratory: Negative for shortness of breath and wheezing.   Cardiovascular: Negative for chest pain and leg swelling.  Gastrointestinal: Positive for abdominal pain. Negative for constipation, diarrhea, nausea and vomiting.  Genitourinary: Negative for decreased urine volume, difficulty urinating, dysuria, flank pain, hematuria and urgency.  Musculoskeletal: Negative for back pain and gait problem.  Skin: Negative for rash.  All other systems reviewed and are negative.   Per HPI unless specifically indicated above   Allergies as of 11/02/2018   No Known Allergies     Medication List       Accurate as of November 02, 2018  4:04 PM. If you have any questions, ask your nurse or doctor.        acetaminophen 500 MG  tablet Commonly known as: TYLENOL Take 1 tablet (500 mg total) by mouth every 6 (six) hours as needed.   amLODipine 2.5 MG tablet Commonly known as: NORVASC Take 1 tablet (2.5 mg total) by mouth daily.   B-12 [phone] MCG Subl Place 1 tablet under the tongue daily.   fluticasone 50 MCG/ACT nasal spray Commonly known as: FLONASE Place 1 spray into both nostrils 2 (two) times daily as needed for allergies or rhinitis.   gabapentin 100 MG capsule Commonly known as: NEURONTIN Take 1 capsule (100 mg total) by mouth 3 (three) times daily.   nystatin ointment Commonly known as: MYCOSTATIN Apply 1 application topically 2 (two) times daily.   pantoprazole 40 MG tablet Commonly known as: PROTONIX Take 1 tablet (40 mg total) by mouth daily.   potassium chloride SA 20 MEQ tablet Commonly known as: KLOR-CON Take 1 tablet (20 mEq total) by mouth 2 (two) times daily.   sildenafil 20 MG tablet Commonly known as: REVATIO Take 1-3 tablets (20-60 mg total) by mouth as needed.   tetrahydrozoline 0.05 % ophthalmic solution Place 1 drop into both eyes every other day.   triamcinolone cream 0.1 % Commonly known as: KENALOG Apply 1 application topically 2 (two) times daily.        Objective:   BP 138/77   Pulse 64   Temp 99.1 F (37.3 C) (Temporal)   Ht 6' (1.829 m)  Wt 203 lb 6.4 oz (92.3 kg)   BMI 27.59 kg/m   Wt Readings from Last 3 Encounters:  11/02/18 203 lb 6.4 oz (92.3 kg)  10/31/18 92 lb (41.7 kg)  10/31/18 204 lb 6.4 oz (92.7 kg)    Physical Exam    Assessment & Plan:   Problem List Items Addressed This Visit    None    Visit Diagnoses    Right upper quadrant abdominal pain    -  Primary   Relevant Orders   Ambulatory referral to General Surgery   CBC with Differential/Platelet   CMP14+EGFR   Calculus of gallbladder with acute cholecystitis without obstruction       Relevant Orders   Ambulatory referral to General Surgery   CBC with  Differential/Platelet   CMP14+EGFR   Renal lesion       Relevant Orders   CT RENAL ABD W/WO   CBC with Differential/Platelet   CMP14+EGFR   Other specified disorders of kidney and ureter       Relevant Orders   CT RENAL ABD W/WO   CBC with Differential/Platelet   CMP14+EGFR      Will order a dedicated renal study for the renal mass and have also ordered referral to general surgery along with a right upper quadrant abdominal ultrasound that he is already scheduled for. Follow up plan: Return in about 5 weeks (around 12/07/2018), or if symptoms worsen or fail to improve, for Follow-up renal lesion and gallbladder.  Counseling provided for all of the vaccine components Orders Placed This Encounter  Procedures  . CT RENAL ABD W/WO  . CBC with Differential/Platelet  . CMP14+EGFR  . Ambulatory referral to Experiment Dettinger, MD  Medicine 11/02/2018, 4:04 PM

## 2018-11-03 DIAGNOSIS — D3000 Benign neoplasm of unspecified kidney: Secondary | ICD-10-CM | POA: Diagnosis not present

## 2018-11-03 LAB — CBC WITH DIFFERENTIAL/PLATELET
Basophils Absolute: 0.1 10*3/uL (ref 0.0–0.2)
Basos: 1 %
EOS (ABSOLUTE): 0.1 10*3/uL (ref 0.0–0.4)
Eos: 2 %
Hematocrit: 39.9 % (ref 37.5–51.0)
Hemoglobin: 12.6 g/dL — ABNORMAL LOW (ref 13.0–17.7)
Immature Grans (Abs): 0 10*3/uL (ref 0.0–0.1)
Immature Granulocytes: 0 %
Lymphocytes Absolute: 1.6 10*3/uL (ref 0.7–3.1)
Lymphs: 22 %
MCH: 25.9 pg — ABNORMAL LOW (ref 26.6–33.0)
MCHC: 31.6 g/dL (ref 31.5–35.7)
MCV: 82 fL (ref 79–97)
Monocytes Absolute: 0.6 10*3/uL (ref 0.1–0.9)
Monocytes: 8 %
Neutrophils Absolute: 4.9 10*3/uL (ref 1.4–7.0)
Neutrophils: 67 %
Platelets: 276 10*3/uL (ref 150–450)
RBC: 4.86 x10E6/uL (ref 4.14–5.80)
RDW: 13.8 % (ref 11.6–15.4)
WBC: 7.3 10*3/uL (ref 3.4–10.8)

## 2018-11-03 LAB — CMP14+EGFR
ALT: 7 IU/L (ref 0–44)
AST: 13 IU/L (ref 0–40)
Albumin/Globulin Ratio: 1.4 (ref 1.2–2.2)
Albumin: 3.9 g/dL (ref 3.8–4.8)
Alkaline Phosphatase: 93 IU/L (ref 39–117)
BUN/Creatinine Ratio: 14 (ref 10–24)
BUN: 16 mg/dL (ref 8–27)
Bilirubin Total: 0.2 mg/dL (ref 0.0–1.2)
CO2: 25 mmol/L (ref 20–29)
Calcium: 9.2 mg/dL (ref 8.6–10.2)
Chloride: 104 mmol/L (ref 96–106)
Creatinine, Ser: 1.16 mg/dL (ref 0.76–1.27)
GFR calc Af Amer: 74 mL/min/{1.73_m2} (ref >59)
GFR calc non Af Amer: 64 mL/min/{1.73_m2} (ref >59)
Globulin, Total: 2.7 g/dL (ref 1.5–4.5)
Glucose: 89 mg/dL (ref 65–99)
Potassium: 3.6 mmol/L (ref 3.5–5.2)
Sodium: 141 mmol/L (ref 134–144)
Total Protein: 6.6 g/dL (ref 6.0–8.5)

## 2018-11-09 ENCOUNTER — Ambulatory Visit (HOSPITAL_COMMUNITY): Payer: Medicare Other

## 2018-11-13 ENCOUNTER — Other Ambulatory Visit (HOSPITAL_COMMUNITY): Payer: Self-pay | Admitting: Urology

## 2018-11-13 ENCOUNTER — Other Ambulatory Visit: Payer: Self-pay | Admitting: Urology

## 2018-11-13 DIAGNOSIS — D3000 Benign neoplasm of unspecified kidney: Secondary | ICD-10-CM

## 2018-11-14 ENCOUNTER — Ambulatory Visit (INDEPENDENT_AMBULATORY_CARE_PROVIDER_SITE_OTHER): Payer: Medicare Other | Admitting: General Surgery

## 2018-11-14 ENCOUNTER — Other Ambulatory Visit: Payer: Self-pay

## 2018-11-14 ENCOUNTER — Encounter: Payer: Self-pay | Admitting: General Surgery

## 2018-11-14 VITALS — BP 162/92 | HR 53 | Temp 97.6°F | Resp 18 | Ht 72.0 in | Wt 203.0 lb

## 2018-11-14 DIAGNOSIS — B888 Other specified infestations: Secondary | ICD-10-CM

## 2018-11-14 DIAGNOSIS — K8020 Calculus of gallbladder without cholecystitis without obstruction: Secondary | ICD-10-CM | POA: Diagnosis not present

## 2018-11-14 NOTE — Progress Notes (Signed)
[facility] History and Physical  Reason for Referral: Cholelithiasis  Referring Physician: Dettinger, [name], MD   Chief Complaint    Cholelithiasis      Todd Berg is a 68 y.o. male who was referred by his PCP for complaints of RUQ pain that required an ED visit on 10/31/18. He underwent a CT at that time that demonstrated a gallstone versus gallbladder polyp.  He also was noted to have a renal mass.  He saw his PCP a few days later and was still complaining of some RUQ pain.  He had been having the pain for about 10 days prior to the visit to the ED an was pretty severe in nature at that time. He said the pain was constant, and was not associated with nausea or vomiting, and did not radiate. The pain was worse with movement.    Today he comes to clinic for further evaluation. He has been set up by Dr. Warrick Parisian to get a RUQ [place], and has been seen by Dr. Mar Daring for the renal mass and has an [place] ordered to evaluate this further.   Today he does not complain of any RUQ pain or associated symptoms.  He has had some pain with greasy meals in the past.   He has had a transanal excision of a rectal polyp or mass that he reports as cancer at [facility] in the past.    Past Medical History:  Diagnosis Date  . Cancer ([place]) 2015   rectal  . Chronic back pain   . COPD (chronic obstructive pulmonary disease) ([place])   . GERD (gastroesophageal reflux disease)   . Hematemesis   . Hemorrhoids   . Hyperlipidemia   . Hypotension   . Melena   . Melena 10/12/2017  . Stroke [facility])     Past Surgical History:  Procedure Laterality Date  . BACK SURGERY    . BIOPSY  10/13/2017   Procedure: BIOPSY;  Surgeon: Laurence Spates, MD;  Location: [facility] ENDOSCOPY;  Service: Endoscopy;;  . ESOPHAGOGASTRODUODENOSCOPY N/A 06/09/2016   Procedure: ESOPHAGOGASTRODUODENOSCOPY (EGD);  Surgeon: Wilford Corner, MD;  Location: [place];  Service: Endoscopy;  Laterality: N/A;  .  ESOPHAGOGASTRODUODENOSCOPY (EGD) WITH PROPOFOL Left 11/21/2016   Procedure: ESOPHAGOGASTRODUODENOSCOPY (EGD) WITH PROPOFOL;  Surgeon: Arta Silence, MD;  Location: [facility] ENDOSCOPY;  Service: Endoscopy;  Laterality: Left;  . ESOPHAGOGASTRODUODENOSCOPY (EGD) WITH PROPOFOL N/A 10/13/2017   Procedure: ESOPHAGOGASTRODUODENOSCOPY (EGD) WITH PROPOFOL;  Surgeon: Laurence Spates, MD;  Location: [place];  Service: Endoscopy;  Laterality: N/A;  . HEMORRHOID SURGERY    . HERNIA REPAIR    . RECTAL SURGERY      Family History  Problem Relation Age of Onset  . COPD Mother   . Obesity Mother   . Stroke Father     Social History   Tobacco Use  . Smoking status: Former Smoker    Start date: 12/30/1964    Quit date: 06/19/2009    Years since quitting: 9.4  . Smokeless tobacco: Former Systems developer    Types: Snuff    Quit date: 09/26/2005  Substance Use Topics  . Alcohol use: No  . Drug use: No    Medications: I have reviewed the patient's current medications. Allergies as of 11/14/2018   No Known Allergies     Medication List       Accurate as of November 14, 2018 11:17 AM. If you have any questions, ask your nurse or doctor.  acetaminophen 500 MG tablet Commonly known as: TYLENOL Take 1 tablet (500 mg total) by mouth every 6 (six) hours as needed.   amLODipine 2.5 MG tablet Commonly known as: NORVASC Take 1 tablet (2.5 mg total) by mouth daily.   B-12 [phone] MCG Subl Place 1 tablet under the tongue daily.   fluticasone 50 MCG/ACT nasal spray Commonly known as: FLONASE Place 1 spray into both nostrils 2 (two) times daily as needed for allergies or rhinitis.   gabapentin 100 MG capsule Commonly known as: NEURONTIN Take 1 capsule (100 mg total) by mouth 3 (three) times daily.   nystatin ointment Commonly known as: MYCOSTATIN Apply 1 application topically 2 (two) times daily.   pantoprazole 40 MG tablet Commonly known as: PROTONIX Take 1 tablet (40 mg total) by mouth daily.    potassium chloride SA 20 MEQ tablet Commonly known as: KLOR-CON Take 1 tablet (20 mEq total) by mouth 2 (two) times daily.   sildenafil 20 MG tablet Commonly known as: REVATIO Take 1-3 tablets (20-60 mg total) by mouth as needed.   tetrahydrozoline 0.05 % ophthalmic solution Place 1 drop into both eyes every other day.   triamcinolone cream 0.1 % Commonly known as: KENALOG Apply 1 application topically 2 (two) times daily.        ROS:  A comprehensive review of systems was negative except for: Gastrointestinal: positive for abdominal pain and pain associated after eating food Musculoskeletal: positive for back pain and stiff joints  Blood pressure (!) 162/92, pulse (!) 53, temperature 97.6 F (36.4 C), temperature source Oral, resp. rate 18, height 6' (1.829 m), weight 203 lb (92.1 kg), SpO2 98 %. Physical Exam Constitutional:      Appearance: Normal appearance.  HENT:     Head: Normocephalic and atraumatic.     Nose: Nose normal.     Mouth/Throat:     Mouth: Mucous membranes are moist.  Eyes:     Extraocular Movements: Extraocular movements intact.     Pupils: Pupils are equal, round, and reactive to light.  Neck:     Musculoskeletal: Normal range of motion.  Cardiovascular:     Rate and Rhythm: Normal rate and regular rhythm.  Pulmonary:     Effort: Pulmonary effort is normal.     Breath sounds: Normal breath sounds.  Abdominal:     General: There is no distension.     Palpations: Abdomen is soft.     Tenderness: There is no abdominal tenderness.     Comments: Pulled up patient's shirt and multiple bed bugs noted to be crawling on patient  Musculoskeletal: Normal range of motion.        General: No swelling.  Skin:    General: Skin is warm and dry.  Neurological:     General: No focal deficit present.     Mental Status: He is alert and oriented to person, place, and time.  Psychiatric:        Mood and Affect: Mood normal.        Behavior: Behavior  normal.        Thought Content: Thought content normal.        Judgment: Judgment normal.     Results: CT a/p 10/20- reviewed personally- contracted looking gallbladder, no surrounding fluid, possible calculus at the neck, no biliary dilation   Assessment & Plan:  Todd Berg is a 68 y.o. male with what sounds like symptomatic cholelithiasis as he is not having pain now but has had pain  with food in the past, and every time he eats now.  The patient had a severe episode a few weeks ago with the pain lasting for 10 days. He is now improved. Unfortunately, during the exam, we did find bed bugs.    - Gave patient information on bed bugs, and have instructed him that he will need to get these eradicated before he will be able to get his [place] - Notified Dr. Warrick Parisian and Dr. Mar Daring of bed bugs and delay in imaging - Patient will need to get the RUQ US of the gallbladder to better assess if this is a polyp or stone, but either way he will likely ultimately need a cholecystectomy, will again have to wait until the bed bugs are no longer on his person - Will call the patient next week and see if he has been able to get the bed bugs under control - Bug sample obtained and appropriate protocols followed for cleaning and treatment of office   We discussed cholecystectomy for the future. I counseled the patient about the indication, risks and benefits of laparoscopic cholecystectomy.  He understands there is a very small chance for bleeding, infection, injury to normal structures (including common bile duct), conversion to open surgery, persistent symptoms, evolution of postcholecystectomy diarrhea, need for secondary interventions, anesthesia reaction, cardiopulmonary issues and other risks not specifically detailed here. I described the expected recovery, the plan for follow-up and the restrictions during the recovery phase.  All questions were answered.  All questions were answered to the satisfaction  of the patient and family.   Virl Cagey 11/14/2018, 11:17 AM

## 2018-11-14 NOTE — Patient Instructions (Addendum)
You seem to have bedbugs. You need to take the appropriate measures listed below as these will spread. You will need to get these taken care of before the hospital will let you get the US of the abdomen.   Bedbugs  Bedbugs are tiny bugs that live in and around beds. They stay hidden during the day, and they come out at night and bite. Bedbugs need blood to live and grow. Where are bedbugs found? Bedbugs can be found anywhere, whether a place is clean or dirty. They are found in places where many people come and go, such as hotels, shelters, dorms, and health care settings. It is also common for them to be found in homes where there are many birds or bats nearby. What are bedbug bites like?  A bedbug bite leaves a small red bump with a darker red dot in the middle. The bump may appear soon after a person is bitten or one or more days later. Bedbug bites usually do not hurt, but they may itch. Most people do not need treatment for bedbug bites. The bumps usually go away on their own in a few days. If you have a lot of bedbug bites and they feel very itchy:  Do not scratch the bite areas.  You may apply any of these to the bite area as told by your health care provider: ? A baking soda paste. Make this by adding water to baking soda. ? Cortisone cream. ? Calamine lotion. How do I check for bedbugs? Adult bedbugs are reddish-brown, oval, and flat. They are about as long as a grain of rice ( inch or 5-7 mm), and they cannot fly. Young bedbugs (nymphs) are smaller, and they are whitish-yellow or clear (translucent). Using a flashlight, look for bedbugs in these places:  On mattresses, bed frames, headboards, and box springs.  On drapes and curtains in bedrooms.  Under carpeting in bedrooms.  Behind electrical outlets.  Behind any wallpaper that is peeling.  Inside luggage. Also look for black or red spots or stains on or near the bed. Stains can come from bedbugs that have been crushed  or from bedbug waste. What should I do if I find bedbugs? When traveling If you find bedbugs while traveling, check all of your possessions carefully before you bring them into your home. Consider throwing away anything that has bedbugs on it. At home If you find bedbugs at home, your bedroom may need to be treated by a pest control expert. You may also need to throw away mattresses or luggage. To help prevent bedbugs from coming back, consider taking these actions:  Wash your clothes and bedding in water that is hotter than 120F (48.9C) and dry them on a hot setting. Bedbugs are killed by high temperatures.  Put a plastic cover over your mattress.  When sleeping, wear pajamas that have long sleeves and pant legs. Bedbugs usually bite areas of the skin that are not covered.  Vacuum often around the bed and in all of the cracks and crevices where the bugs might hide.  Carefully check all used furniture, bedding, or clothes that you bring into your home.  Eliminate bird nests and bat roosts that are near your home. Where to find more information  [employer]. Investment banker, operational (EPA): [email or web address].[email or web address].nz Summary  Bedbugs are tiny bugs that live in and around beds.  Bedbugs are most often found in places where many people come and go, such as hotels, shelters, dorms, and health care  settings.  A bedbug bite leaves a small red bump with a darker red dot in the middle.  Bedbug bites usually do not hurt, but they may itch.  If you find bedbugs at home, your bedroom may need to be treated by a pest control expert. This information is not intended to replace advice given to you by your health care provider. Make sure you discuss any questions you have with your health care provider. Document Released: 01/30/2010 Document Revised: 12/10/2016 Document Reviewed: 08/20/2016 Elsevier Patient Education  2020 [employer].  Cholelithiasis  Cholelithiasis is also called "gallstones." It  is a kind of gallbladder disease. The gallbladder is an organ that stores a liquid (bile) that helps you digest fat. Gallstones may not cause symptoms (may be silent gallstones) until they cause a blockage, and then they can cause pain (gallbladder attack). Follow these instructions at home:  Take over-the-counter and prescription medicines only as told by your doctor.  Stay at a healthy weight.  Eat healthy foods. This includes: ? Eating fewer fatty foods, like fried foods. ? Eating fewer refined carbs (refined carbohydrates). Refined carbs are breads and grains that are highly processed, like white bread and white rice. Instead, choose whole grains like whole-wheat bread and brown rice. ? Eating more fiber. Almonds, fresh fruit, and beans are healthy sources of fiber.  Keep all follow-up visits as told by your doctor. This is important. Contact a doctor if:  You have sudden pain in the upper right side of your belly (abdomen). Pain might spread to your right shoulder or your chest. This may be a sign of a gallbladder attack.  You feel sick to your stomach (are nauseous).  You throw up (vomit).  You have been diagnosed with gallstones that have no symptoms and you get: ? Belly pain. ? Discomfort, burning, or fullness in the upper part of your belly (indigestion). Get help right away if:  You have sudden pain in the upper right side of your belly, and it lasts for more than 2 hours.  You have belly pain that lasts for more than 5 hours.  You have a fever or chills.  You keep feeling sick to your stomach or you keep throwing up.  Your skin or the whites of your eyes turn yellow (jaundice).  You have dark-colored pee (urine).  You have light-colored poop (stool). Summary  Cholelithiasis is also called "gallstones."  The gallbladder is an organ that stores a liquid (bile) that helps you digest fat.  Silent gallstones are gallstones that do not cause symptoms.  A  gallbladder attack may cause sudden pain in the upper right side of your belly. Pain might spread to your right shoulder or your chest. If this happens, contact your doctor.  If you have sudden pain in the upper right side of your belly that lasts for more than 2 hours, get help right away. This information is not intended to replace advice given to you by your health care provider. Make sure you discuss any questions you have with your health care provider. Document Released: 06/16/2007 Document Revised: 12/10/2016 Document Reviewed: 09/14/2015 Elsevier Patient Education  2020 [employer].

## 2018-11-19 ENCOUNTER — Other Ambulatory Visit: Payer: Self-pay

## 2018-11-19 ENCOUNTER — Inpatient Hospital Stay (HOSPITAL_COMMUNITY)
Admission: EM | Admit: 2018-11-19 | Discharge: 2018-11-21 | DRG: 391 | Disposition: A | Discharge status: Home / Self Care | Payer: Medicare Other | Attending: Family Medicine | Admitting: Family Medicine

## 2018-11-19 ENCOUNTER — Encounter (HOSPITAL_COMMUNITY): Payer: Self-pay

## 2018-11-19 DIAGNOSIS — Z825 Family history of asthma and other chronic lower respiratory diseases: Secondary | ICD-10-CM | POA: Diagnosis not present

## 2018-11-19 DIAGNOSIS — K254 Chronic or unspecified gastric ulcer with hemorrhage: Secondary | ICD-10-CM | POA: Diagnosis not present

## 2018-11-19 DIAGNOSIS — Z87891 Personal history of nicotine dependence: Secondary | ICD-10-CM

## 2018-11-19 DIAGNOSIS — K921 Melena: Secondary | ICD-10-CM | POA: Diagnosis not present

## 2018-11-19 DIAGNOSIS — R58 Hemorrhage, not elsewhere classified: Secondary | ICD-10-CM | POA: Diagnosis not present

## 2018-11-19 DIAGNOSIS — Z20828 Contact with and (suspected) exposure to other viral communicable diseases: Secondary | ICD-10-CM | POA: Diagnosis present

## 2018-11-19 DIAGNOSIS — J449 Chronic obstructive pulmonary disease, unspecified: Secondary | ICD-10-CM | POA: Diagnosis present

## 2018-11-19 DIAGNOSIS — K449 Diaphragmatic hernia without obstruction or gangrene: Secondary | ICD-10-CM | POA: Diagnosis present

## 2018-11-19 DIAGNOSIS — R531 Weakness: Secondary | ICD-10-CM | POA: Diagnosis not present

## 2018-11-19 DIAGNOSIS — R933 Abnormal findings on diagnostic imaging of other parts of digestive tract: Secondary | ICD-10-CM | POA: Diagnosis present

## 2018-11-19 DIAGNOSIS — E538 Deficiency of other specified B group vitamins: Secondary | ICD-10-CM | POA: Diagnosis present

## 2018-11-19 DIAGNOSIS — Z8673 Personal history of transient ischemic attack (TIA), and cerebral infarction without residual deficits: Secondary | ICD-10-CM

## 2018-11-19 DIAGNOSIS — R519 Headache, unspecified: Secondary | ICD-10-CM | POA: Diagnosis not present

## 2018-11-19 DIAGNOSIS — K222 Esophageal obstruction: Principal | ICD-10-CM | POA: Diagnosis present

## 2018-11-19 DIAGNOSIS — K219 Gastro-esophageal reflux disease without esophagitis: Secondary | ICD-10-CM | POA: Diagnosis present

## 2018-11-19 DIAGNOSIS — I10 Essential (primary) hypertension: Secondary | ICD-10-CM | POA: Diagnosis present

## 2018-11-19 DIAGNOSIS — D500 Iron deficiency anemia secondary to blood loss (chronic): Secondary | ICD-10-CM | POA: Diagnosis present

## 2018-11-19 DIAGNOSIS — D62 Acute posthemorrhagic anemia: Secondary | ICD-10-CM | POA: Diagnosis not present

## 2018-11-19 DIAGNOSIS — Z7951 Long term (current) use of inhaled steroids: Secondary | ICD-10-CM | POA: Diagnosis not present

## 2018-11-19 DIAGNOSIS — Z8349 Family history of other endocrine, nutritional and metabolic diseases: Secondary | ICD-10-CM | POA: Diagnosis not present

## 2018-11-19 DIAGNOSIS — R069 Unspecified abnormalities of breathing: Secondary | ICD-10-CM | POA: Diagnosis not present

## 2018-11-19 DIAGNOSIS — Z823 Family history of stroke: Secondary | ICD-10-CM

## 2018-11-19 DIAGNOSIS — R93429 Abnormal radiologic findings on diagnostic imaging of unspecified kidney: Secondary | ICD-10-CM | POA: Diagnosis not present

## 2018-11-19 DIAGNOSIS — G8929 Other chronic pain: Secondary | ICD-10-CM | POA: Diagnosis not present

## 2018-11-19 DIAGNOSIS — T39395A Adverse effect of other nonsteroidal anti-inflammatory drugs [NSAID], initial encounter: Secondary | ICD-10-CM | POA: Diagnosis present

## 2018-11-19 DIAGNOSIS — Z03818 Encounter for observation for suspected exposure to other biological agents ruled out: Secondary | ICD-10-CM | POA: Diagnosis not present

## 2018-11-19 DIAGNOSIS — Z85048 Personal history of other malignant neoplasm of rectum, rectosigmoid junction, and anus: Secondary | ICD-10-CM | POA: Diagnosis not present

## 2018-11-19 DIAGNOSIS — K922 Gastrointestinal hemorrhage, unspecified: Secondary | ICD-10-CM | POA: Diagnosis not present

## 2018-11-19 DIAGNOSIS — Z8719 Personal history of other diseases of the digestive system: Secondary | ICD-10-CM

## 2018-11-19 DIAGNOSIS — R4020 Unspecified coma: Secondary | ICD-10-CM | POA: Diagnosis not present

## 2018-11-19 DIAGNOSIS — Z79899 Other long term (current) drug therapy: Secondary | ICD-10-CM

## 2018-11-19 LAB — CBC WITH DIFFERENTIAL/PLATELET
Abs Immature Granulocytes: 0.03 10*3/uL (ref 0.00–0.07)
Basophils Absolute: 0.1 10*3/uL (ref 0.0–0.1)
Basophils Relative: 1 %
Eosinophils Absolute: 0.1 10*3/uL (ref 0.0–0.5)
Eosinophils Relative: 1 %
HCT: 38.5 % — ABNORMAL LOW (ref 39.0–52.0)
Hemoglobin: 11.8 g/dL — ABNORMAL LOW (ref 13.0–17.0)
Immature Granulocytes: 0 %
Lymphocytes Relative: 17 %
Lymphs Abs: 1.4 10*3/uL (ref 0.7–4.0)
MCH: 26.3 pg (ref 26.0–34.0)
MCHC: 30.6 g/dL (ref 30.0–36.0)
MCV: 85.9 fL (ref 80.0–100.0)
Monocytes Absolute: 0.7 10*3/uL (ref 0.1–1.0)
Monocytes Relative: 8 %
Neutro Abs: 6.1 10*3/uL (ref 1.7–7.7)
Neutrophils Relative %: 73 %
Platelets: 317 10*3/uL (ref 150–400)
RBC: 4.48 MIL/uL (ref 4.22–5.81)
RDW: 13.5 % (ref 11.5–15.5)
WBC: 8.3 10*3/uL (ref 4.0–10.5)
nRBC: 0 % (ref 0.0–0.2)

## 2018-11-19 LAB — COMPREHENSIVE METABOLIC PANEL
ALT: 11 U/L (ref 0–44)
AST: 12 U/L — ABNORMAL LOW (ref 15–41)
Albumin: 3.4 g/dL — ABNORMAL LOW (ref 3.5–5.0)
Alkaline Phosphatase: 72 U/L (ref 38–126)
Anion gap: 7 (ref 5–15)
BUN: 43 mg/dL — ABNORMAL HIGH (ref 8–23)
CO2: 22 mmol/L (ref 22–32)
Calcium: 8.6 mg/dL — ABNORMAL LOW (ref 8.9–10.3)
Chloride: 107 mmol/L (ref 98–111)
Creatinine, Ser: 0.84 mg/dL (ref 0.61–1.24)
GFR calc Af Amer: >60 mL/min (ref >60)
GFR calc non Af Amer: >60 mL/min (ref >60)
Glucose, Bld: 97 mg/dL (ref 70–99)
Potassium: 4 mmol/L (ref 3.5–5.1)
Sodium: 136 mmol/L (ref 135–145)
Total Bilirubin: 0.7 mg/dL (ref 0.3–1.2)
Total Protein: 6.9 g/dL (ref 6.5–8.1)

## 2018-11-19 LAB — HEMOGLOBIN AND HEMATOCRIT, BLOOD
HCT: 37.8 % — ABNORMAL LOW (ref 39.0–52.0)
Hemoglobin: 11.9 g/dL — ABNORMAL LOW (ref 13.0–17.0)

## 2018-11-19 LAB — TYPE AND SCREEN
ABO/RH(D): O NEG
Antibody Screen: NEGATIVE

## 2018-11-19 LAB — POC OCCULT BLOOD, ED: Fecal Occult Bld: POSITIVE — AB

## 2018-11-19 MED ORDER — SODIUM CHLORIDE 0.9 % IV SOLN
INTRAVENOUS | Status: AC
  Administered 2018-11-19: 22:00:00 via INTRAVENOUS

## 2018-11-19 MED ORDER — ONDANSETRON HCL 4 MG PO TABS: 4.0000 mg | ORAL_TABLET | Freq: Four times a day (QID) | ORAL | Status: DC | PRN

## 2018-11-19 MED ORDER — ALUM & MAG HYDROXIDE-SIMETH 200-200-20 MG/5ML PO SUSP
30.0000 mL | ORAL | Status: DC | PRN
  Administered 2018-11-19: 30 mL via ORAL
  Filled 2018-11-19: qty 30

## 2018-11-19 MED ORDER — GABAPENTIN 100 MG PO CAPS: 100.0000 mg | ORAL_CAPSULE | Freq: Every day | ORAL | Status: DC | PRN

## 2018-11-19 MED ORDER — ACETAMINOPHEN 325 MG PO TABS
650.0000 mg | ORAL_TABLET | Freq: Four times a day (QID) | ORAL | Status: DC | PRN
  Administered 2018-11-21: 650 mg via ORAL
  Filled 2018-11-19: qty 2

## 2018-11-19 MED ORDER — ONDANSETRON HCL 4 MG/2ML IJ SOLN: 4.0000 mg | Freq: Four times a day (QID) | INTRAMUSCULAR | Status: DC | PRN

## 2018-11-19 MED ORDER — POLYETHYLENE GLYCOL 3350 17 G PO PACK: 17.0000 g | PACK | Freq: Every day | ORAL | Status: DC | PRN

## 2018-11-19 MED ORDER — ACETAMINOPHEN 650 MG RE SUPP: 650.0000 mg | Freq: Four times a day (QID) | RECTAL | Status: DC | PRN

## 2018-11-19 MED ORDER — FAMOTIDINE IN NACL 20-0.9 MG/50ML-% IV SOLN
20.0000 mg | Freq: Once | INTRAVENOUS | Status: AC
  Administered 2018-11-19: 14:00:00 20 mg via INTRAVENOUS
  Filled 2018-11-19: qty 50

## 2018-11-19 MED ORDER — PANTOPRAZOLE SODIUM 40 MG IV SOLR
40.0000 mg | Freq: Two times a day (BID) | INTRAVENOUS | Status: DC
  Administered 2018-11-20: 06:00:00 40 mg via INTRAVENOUS
  Filled 2018-11-19: qty 40

## 2018-11-19 MED ORDER — PANTOPRAZOLE SODIUM 40 MG IV SOLR
40.0000 mg | Freq: Once | INTRAVENOUS | Status: AC
  Administered 2018-11-19: 40 mg via INTRAVENOUS
  Filled 2018-11-19: qty 40

## 2018-11-19 MED ORDER — ALUM & MAG HYDROXIDE-SIMETH 200-200-20 MG/5ML PO SUSP
30.0000 mL | Freq: Once | ORAL | Status: AC
  Administered 2018-11-19: 30 mL via ORAL
  Filled 2018-11-19: qty 30

## 2018-11-19 NOTE — H&P (Signed)
History and Physical    Todd Berg [id number] DOB: February 09, 1950 DOA: 11/19/2018  PCP: Dettinger, [name], MD   Patient coming from: Home  I have personally briefly reviewed patient's old medical records in [place]  Chief Complaint: Black stools  HPI: Todd Berg is a 68 y.o. male with medical history significant for gastric ulcer, COPD, hypertension, rectal cancer.  Patient presented to the ED with complaints of at least 5 episodes of black loose stools that started today.  Reports stools are black like tar.  He denies vomiting, no abdominal pain.  No blood in stools.  He reports some dizziness earlier in the day when this started, but no dizziness now.  No chest pain or difficulty breathing. Patient reports he has been taking ibuprofen 400 mg a day, at least 4 days a week, for headaches.   ED Course:.  Stable vitals.  Hemoglobin close to baseline at 11.8.  Stool FOBT positive. EDP talked to Dr. Gala Romney, will see patient in a.m.  Review of Systems: As per HPI all other systems reviewed and negative.  Past Medical History:  Diagnosis Date   Cancer [facility]) 2015   rectal   Chronic back pain    COPD (chronic obstructive pulmonary disease) ([facility])    GERD (gastroesophageal reflux disease)    Hematemesis    Hemorrhoids    Hyperlipidemia    Hypotension    Melena    Melena 10/12/2017   Stroke [facility])     Past Surgical History:  Procedure Laterality Date   BACK SURGERY     BIOPSY  10/13/2017   Procedure: BIOPSY;  Surgeon: Laurence Spates, MD;  Location: [place];  Service: Endoscopy;;   ESOPHAGOGASTRODUODENOSCOPY N/A 06/09/2016   Procedure: ESOPHAGOGASTRODUODENOSCOPY (EGD);  Surgeon: Wilford Corner, MD;  Location: [place];  Service: Endoscopy;  Laterality: N/A;   ESOPHAGOGASTRODUODENOSCOPY (EGD) WITH PROPOFOL Left 11/21/2016   Procedure: ESOPHAGOGASTRODUODENOSCOPY (EGD) WITH PROPOFOL;  Surgeon: Arta Silence, MD;  Location: [facility] ENDOSCOPY;   Service: Endoscopy;  Laterality: Left;   ESOPHAGOGASTRODUODENOSCOPY (EGD) WITH PROPOFOL N/A 10/13/2017   Procedure: ESOPHAGOGASTRODUODENOSCOPY (EGD) WITH PROPOFOL;  Surgeon: Laurence Spates, MD;  Location: [place];  Service: Endoscopy;  Laterality: N/A;   HEMORRHOID SURGERY     HERNIA REPAIR     RECTAL SURGERY       reports that he quit smoking about 9 years ago. He started smoking about 53 years ago. He quit smokeless tobacco use about 13 years ago.  His smokeless tobacco use included snuff. He reports that he does not drink alcohol or use drugs.  No Known Allergies  Family History  Problem Relation Age of Onset   COPD Mother    Obesity Mother    Stroke Father     Prior to Admission medications   Medication Sig Start Date End Date Taking? Authorizing Provider  amLODipine (NORVASC) 2.5 MG tablet Take 1 tablet (2.5 mg total) by mouth daily. 05/04/18  Yes Dettinger, [name], MD  Cobalamin Combinations (B-12) [phone] MCG SUBL Place 1 tablet under the tongue daily. 08/03/18  Yes Rakes, Connye Burkitt, FNP  gabapentin (NEURONTIN) 100 MG capsule Take 1 capsule (100 mg total) by mouth 3 (three) times daily. Patient taking differently: Take 100 mg by mouth daily as needed (for pain in legs). *May take one capsule 3 times daily as prescribed 09/14/18  Yes Dettinger, [name], MD  nystatin ointment (MYCOSTATIN) Apply 1 application topically 2 (two) times daily. 09/22/18  Yes Dettinger, [name], MD  pantoprazole (PROTONIX) 40 MG tablet Take 1 tablet (40 mg total) by mouth daily. 05/04/18  Yes Dettinger, [name], MD  sildenafil (REVATIO) 20 MG tablet Take 1-3 tablets (20-60 mg total) by mouth as needed. 09/13/18  Yes Dettinger, [name], MD  tetrahydrozoline 0.05 % ophthalmic solution Place 1 drop into both eyes every other day.    Yes [provider]  acetaminophen (TYLENOL) 500 MG tablet Take 1 tablet (500 mg total) by mouth every 6 (six) hours as needed. Patient not taking: Reported on  11/19/2018 10/31/18   Rodell Perna A, PA-C  potassium chloride SA (K-DUR) 20 MEQ tablet Take 1 tablet (20 mEq total) by mouth 2 (two) times daily. Patient not taking: Reported on [id number]   Delora Fuel, MD  triamcinolone cream (KENALOG) 0.1 % Apply 1 application topically 2 (two) times daily. Patient not taking: Reported on 11/19/2018 09/22/18   Dettinger, [name], MD    Physical Exam: Vitals:   11/19/18 1600 11/19/18 1630 11/19/18 1751 11/19/18 1810  BP:  (!) 161/96 140/84 (!) 142/53  Pulse: 97 92 94 92  Resp: 20 18 18 18   Temp:   98.3 F (36.8 C)   TempSrc:   Oral   SpO2: 99% 99% 99% 99%  Weight:      Height:        Constitutional: Sitting up on the side of the bed, denies dizziness, NAD, calm, comfortable Vitals:   11/19/18 1600 11/19/18 1630 11/19/18 1751 11/19/18 1810  BP:  (!) 161/96 140/84 (!) 142/53  Pulse: 97 92 94 92  Resp: 20 18 18 18   Temp:   98.3 F (36.8 C)   TempSrc:   Oral   SpO2: 99% 99% 99% 99%  Weight:      Height:       Eyes: PERRL, lids and conjunctivae normal ENMT: Mucous membranes are moist. Posterior pharynx clear of any exudate or lesions.  Neck: normal, supple, no masses, no thyromegaly Respiratory: clear to auscultation bilaterally, no wheezing, no crackles. Normal respiratory effort. No accessory muscle use.  Cardiovascular: Regular rate and rhythm, no murmurs / rubs / gallops. No extremity edema. 2+ pedal pulses.  Abdomen: no tenderness, no masses palpated. No hepatosplenomegaly. Bowel sounds positive.  Musculoskeletal: no clubbing / cyanosis. No joint deformity upper and lower extremities. Good ROM, no contractures. Normal muscle tone.  Skin: no rashes, lesions, ulcers. No induration Neurologic: CN 2-12 grossly intact. Sensation intact, DTR normal. Strength 5/5 in all 4.  Psychiatric: Normal judgment and insight. Alert and oriented x 3. Normal mood.   Labs on Admission: I have personally reviewed following labs and imaging  studies  CBC: Recent Labs  Lab 11/19/18 1415  WBC 8.3  NEUTROABS 6.1  HGB 11.8*  HCT 38.5*  MCV 85.9  PLT [id number]   Basic Metabolic Panel: Recent Labs  Lab 11/19/18 1415  NA 136  K 4.0  CL 107  CO2 22  GLUCOSE 97  BUN 43*  CREATININE 0.84  CALCIUM 8.6*   Liver Function Tests: Recent Labs  Lab 11/19/18 1415  AST 12*  ALT 11  ALKPHOS 72  BILITOT 0.7  PROT 6.9  ALBUMIN 3.4*   Urine analysis:    Component Value Date/Time   COLORURINE YELLOW 10/31/2018 [place] 10/31/2018 1302   APPEARANCEUR Clear 07/16/2017 0828   LABSPEC 1.018 10/31/2018 1302   PHURINE 6.0 10/31/2018 [place] 10/31/2018 1302   HGBUR NEGATIVE 10/31/2018 1302   BILIRUBINUR NEGATIVE  10/31/2018 1302   BILIRUBINUR Negative 07/16/2017 0828   KETONESUR NEGATIVE 10/31/2018 1302   PROTEINUR NEGATIVE 10/31/2018 1302   UROBILINOGEN 0.2 02/26/2007 1901   NITRITE NEGATIVE 10/31/2018 1302   LEUKOCYTESUR NEGATIVE 10/31/2018 1302    Radiological Exams on Admission: No results found.  EKG: Independently reviewed.   Assessment/Plan Active Problems:   Melena   Upper GI blood loss- reports melena , + FOBT,  hemoglobin 11.8, close to baseline 12-13.  Reports earlier dizziness. Reports NSAID use.  History of gastric ulcer.  At least 3 prior endoscopies last endoscopy 10/29, showed gastric ulcer. -GI consult -Follow hemoglobin closely, goal hemoglobin greater than 8. -Continue IV Protonix 40 every 12 hourly - IVF N/s 100cc/hr x 10 hrs -N.p.o. midnight  COPD-stable.  Hypertension-systolic 0000000 to [id number]. -Hold home Norvasc 2.5mg  for now in the setting of GI bleed.   DVT prophylaxis: SCDs Code Status: Full code Family Communication: Spouse at bedside  disposition Plan:  Per rounding team Consults called: GI Admission status: Obs., tele   Bethena Roys MD [facility]  11/19/2018, 9:00 PM

## 2018-11-19 NOTE — ED Provider Notes (Signed)
[facility] EMERGENCY DEPARTMENT Provider Note   CSN: [id number] Arrival date & time: 11/19/18  1336     History   Chief Complaint Chief Complaint  Patient presents with  . GI Bleeding    HPI Todd Berg is a 68 y.o. male with a past medical history of PUD, hypertension, COPD, GERD presents to the ED for black tarry stools.  He woke up this morning in his usual state of health.  He is unsure if he ate breakfast this morning.  He had anywhere from 3-5 episodes of black tarry stools which he describes as loose.  He became short of breath after becoming anxious that he may lose consciousness.  States that this feels similar to the time he was diagnosed with a bleeding ulcer.  Patient is unsure of the medications he is on but states that he has been compliant with his medications.  Reports some vague epigastric abdominal pain, denies any vomiting, fever, chest pain, shortness of breath, suspicious food ingestions, sick contacts with similar symptoms or anticoagulant use.     HPI  Past Medical History:  Diagnosis Date  . Cancer ([place]) 2015   rectal  . Chronic back pain   . COPD (chronic obstructive pulmonary disease) ([place])   . GERD (gastroesophageal reflux disease)   . Hematemesis   . Hemorrhoids   . Hyperlipidemia   . Hypotension   . Melena   . Melena 10/12/2017  . Stroke [facility])     Patient Active Problem List   Diagnosis Date Noted  . Infestation by bed bug 11/14/2018  . Gallstones 11/14/2018  . Vitamin B12 deficiency 08/03/2018  . Numbness and tingling of both legs 08/03/2018  . Anemia 10/10/2017  . Fatigue 10/10/2017  . Hypertension 03/21/2017  . Generalized anxiety disorder 03/21/2017  . Degenerative disc disease, lumbar 03/17/2017  . Iron deficiency anemia due to chronic blood loss 11/20/2016  . Chronic back pain   . COPD (chronic obstructive pulmonary disease) ([place])   . GERD (gastroesophageal reflux disease)   . History of rectal cancer 09/13/2015    Past  Surgical History:  Procedure Laterality Date  . BACK SURGERY    . BIOPSY  10/13/2017   Procedure: BIOPSY;  Surgeon: Laurence Spates, MD;  Location: [facility] ENDOSCOPY;  Service: Endoscopy;;  . ESOPHAGOGASTRODUODENOSCOPY N/A 06/09/2016   Procedure: ESOPHAGOGASTRODUODENOSCOPY (EGD);  Surgeon: Wilford Corner, MD;  Location: [place];  Service: Endoscopy;  Laterality: N/A;  . ESOPHAGOGASTRODUODENOSCOPY (EGD) WITH PROPOFOL Left 11/21/2016   Procedure: ESOPHAGOGASTRODUODENOSCOPY (EGD) WITH PROPOFOL;  Surgeon: Arta Silence, MD;  Location: [facility] ENDOSCOPY;  Service: Endoscopy;  Laterality: Left;  . ESOPHAGOGASTRODUODENOSCOPY (EGD) WITH PROPOFOL N/A 10/13/2017   Procedure: ESOPHAGOGASTRODUODENOSCOPY (EGD) WITH PROPOFOL;  Surgeon: Laurence Spates, MD;  Location: [place];  Service: Endoscopy;  Laterality: N/A;  . HEMORRHOID SURGERY    . HERNIA REPAIR    . RECTAL SURGERY          Home Medications    Prior to Admission medications   Medication Sig Start Date End Date Taking? Authorizing Provider  acetaminophen (TYLENOL) 500 MG tablet Take 1 tablet (500 mg total) by mouth every 6 (six) hours as needed. 10/31/18   Fawze, Mina A, PA-C  amLODipine (NORVASC) 2.5 MG tablet Take 1 tablet (2.5 mg total) by mouth daily. 05/04/18   Dettinger, [name], MD  Cobalamin Combinations (B-12) [phone] MCG SUBL Place 1 tablet under the tongue daily. 08/03/18   Baruch Gouty, FNP  fluticasone (FLONASE) 50 MCG/ACT nasal spray  Place 1 spray into both nostrils 2 (two) times daily as needed for allergies or rhinitis. 12/09/17   Hassell Done, Mary-Margaret, FNP  gabapentin (NEURONTIN) 100 MG capsule Take 1 capsule (100 mg total) by mouth 3 (three) times daily. 09/14/18   Dettinger, [name], MD  nystatin ointment (MYCOSTATIN) Apply 1 application topically 2 (two) times daily. 09/22/18   Dettinger, [name], MD  pantoprazole (PROTONIX) 40 MG tablet Take 1 tablet (40 mg total) by mouth daily. 05/04/18   Dettinger, [name], MD   potassium chloride SA (K-DUR) 20 MEQ tablet Take 1 tablet (20 mEq total) by mouth 2 (two) times daily. [id number]   Delora Fuel, MD  sildenafil (REVATIO) 20 MG tablet Take 1-3 tablets (20-60 mg total) by mouth as needed. 09/13/18   Dettinger, [name], MD  tetrahydrozoline 0.05 % ophthalmic solution Place 1 drop into both eyes every other day.     [provider]  triamcinolone cream (KENALOG) 0.1 % Apply 1 application topically 2 (two) times daily. 09/22/18   Dettinger, [name], MD    Family History Family History  Problem Relation Age of Onset  . COPD Mother   . Obesity Mother   . Stroke Father     Social History Social History   Tobacco Use  . Smoking status: Former Smoker    Start date: 12/30/1964    Quit date: 06/19/2009    Years since quitting: 9.4  . Smokeless tobacco: Former Systems developer    Types: Snuff    Quit date: 09/26/2005  Substance Use Topics  . Alcohol use: No  . Drug use: No     Allergies   Patient has no known allergies.   Review of Systems Review of Systems  Constitutional: Negative for appetite change, chills and fever.  HENT: Negative for ear pain, rhinorrhea, sneezing and sore throat.   Eyes: Negative for photophobia and visual disturbance.  Respiratory: Negative for cough, chest tightness, shortness of breath and wheezing.   Cardiovascular: Negative for chest pain and palpitations.  Gastrointestinal: Positive for abdominal pain and blood in stool. Negative for constipation, diarrhea, nausea and vomiting.  Genitourinary: Negative for dysuria, hematuria and urgency.  Musculoskeletal: Negative for myalgias.  Skin: Negative for rash.  Neurological: Negative for dizziness, weakness and light-headedness.     Physical Exam Updated Vital Signs BP (!) 154/96   Pulse 91   Temp 98.4 F (36.9 C) (Oral)   Resp (!) 22   Ht 6' (1.829 m)   Wt 108.9 kg   SpO2 99%   BMI 32.55 kg/m   Physical Exam Vitals signs and nursing note reviewed. Exam conducted  with a chaperone present.  Constitutional:      General: He is not in acute distress.    Appearance: He is well-developed.  HENT:     Head: Normocephalic and atraumatic.     Nose: Nose normal.  Eyes:     General: No scleral icterus.       Left eye: No discharge.     Conjunctiva/sclera: Conjunctivae normal.  Neck:     Musculoskeletal: Normal range of motion and neck supple.  Cardiovascular:     Rate and Rhythm: Normal rate and regular rhythm.     Heart sounds: Normal heart sounds. No murmur. No friction rub. No gallop.   Pulmonary:     Effort: Pulmonary effort is normal. No respiratory distress.     Breath sounds: Normal breath sounds.  Abdominal:     General: Bowel sounds are normal. There  is no distension.     Palpations: Abdomen is soft.     Tenderness: There is abdominal tenderness (Epigastric). There is no guarding.  Genitourinary:    Rectum: Guaiac result positive. No external hemorrhoid or internal hemorrhoid.     Comments: No hemorrhoids.  Dark, black stool noted on DRE. Musculoskeletal: Normal range of motion.  Skin:    General: Skin is warm and dry.     Findings: No rash.  Neurological:     Mental Status: He is alert.     Motor: No abnormal muscle tone.     Coordination: Coordination normal.      ED Treatments / Results  Labs (all labs ordered are listed, but only abnormal results are displayed) Labs Reviewed  COMPREHENSIVE METABOLIC PANEL - Abnormal; Notable for the following components:      Result Value   BUN 43 (*)    Calcium 8.6 (*)    Albumin 3.4 (*)    AST 12 (*)    All other components within normal limits  CBC WITH DIFFERENTIAL/PLATELET - Abnormal; Notable for the following components:   Hemoglobin 11.8 (*)    HCT 38.5 (*)    All other components within normal limits  POC OCCULT BLOOD, ED - Abnormal; Notable for the following components:   Fecal Occult Bld POSITIVE (*)    All other components within normal limits  SARS CORONAVIRUS 2 (TAT 6-24  HRS)  TYPE AND SCREEN    EKG None  Radiology No results found.  Procedures Procedures (including critical care time)  Medications Ordered in ED Medications  pantoprazole (PROTONIX) injection 40 mg (has no administration in time range)  alum & mag hydroxide-simeth (MAALOX/MYLANTA) 200-200-20 MG/5ML suspension 30 mL (has no administration in time range)  famotidine (PEPCID) IVPB 20 mg premix (0 mg Intravenous Stopped 11/19/18 1439)     Initial Impression / Assessment and Plan / ED Course  I have reviewed the triage vital signs and the nursing notes.  Pertinent labs & imaging results that were available during my care of the patient were reviewed by me and considered in my medical decision making (see chart for details).  Clinical Course as of Nov 19 1618  Sun Nov 19, 2018  1458 Hemoglobin(!): 11.8 [HK]  1458 Fecal Occult Blood, POC(!): POSITIVE [HK]  1536 Spoke to Dr. Paulita Fujita of [facility].  Asked that we speak to on-call GI doctor here at [facility] but Dr. Paulita Fujita will admit the patient if on-call service prefers.   [HK]  1618 Spoke to Dr. Gala Romney of gastroenterology here.  Recommends that we start patient on Protonix and he will evaluate the patient in consult in the morning on hospitalist service.   [HK]    Clinical Course User Index [HK] Delia Heady, PA-C       68 year old male with a past medical history of PUD presenting to the ED with chief complaint of melena.  Symptoms began this morning.  Reports 3-5 episodes of dark tarry stool.  Patient also reports upper epigastric pain and feelings of indigestion.  History of similar symptoms in the past.  DRE revealed dark tarry stool which is Hemoccult positive.  Patient given Pepcid and GI cocktail.  Spoke to GI who recommends Protonix, hospitalist admission and they will consult in the morning.  Patient remains hemodynamically stable.  Final Clinical Impressions(s) / ED Diagnoses   Final diagnoses:  Upper GI bleed  Melena     ED Discharge Orders    None  Portions of this note were generated with Lobbyist. Dictation errors may occur despite best attempts at proofreading.    Delia Heady, PA-C 11/19/18 1620    Margette Fast, MD 11/19/18 [phone]

## 2018-11-19 NOTE — Progress Notes (Signed)
Patient C/o chest pain. Stat EKG order. Vital signs within normal limits. PRN Maalox given. MD made aware via Amion.

## 2018-11-19 NOTE — ED Notes (Signed)
[facility] called to [phone] at this time.

## 2018-11-19 NOTE — ED Triage Notes (Signed)
EMS reports pt was home alone and around 1130 he had approx 5 black tarry stools.  Reports he went to his family's house and pt got upset, worried he might pass out.  Reports was sob.  EMS was called and when they arrived they said pt was very anxiouis, o2 sat 95%.  Placed pt on o2 at 2liters, and pt felt better.  History of bleeding ulcer.  Pt alert and oriented.  Denies any pain or sob.

## 2018-11-19 NOTE — ED Notes (Signed)
Pt reports pain has eased.

## 2018-11-19 NOTE — ED Notes (Signed)
Rourk paged to [phone] at this time.

## 2018-11-20 ENCOUNTER — Observation Stay (HOSPITAL_COMMUNITY): Payer: Medicare Other | Admitting: Anesthesiology

## 2018-11-20 ENCOUNTER — Encounter (HOSPITAL_COMMUNITY)
Admission: EM | Disposition: A | Discharge status: Home / Self Care | Payer: Self-pay | Source: Home / Self Care | Attending: Family Medicine

## 2018-11-20 ENCOUNTER — Encounter (HOSPITAL_COMMUNITY): Payer: Self-pay | Admitting: Gastroenterology

## 2018-11-20 DIAGNOSIS — Z7951 Long term (current) use of inhaled steroids: Secondary | ICD-10-CM | POA: Diagnosis not present

## 2018-11-20 DIAGNOSIS — R519 Headache, unspecified: Secondary | ICD-10-CM | POA: Diagnosis not present

## 2018-11-20 DIAGNOSIS — K259 Gastric ulcer, unspecified as acute or chronic, without hemorrhage or perforation: Secondary | ICD-10-CM | POA: Diagnosis not present

## 2018-11-20 DIAGNOSIS — K922 Gastrointestinal hemorrhage, unspecified: Secondary | ICD-10-CM

## 2018-11-20 DIAGNOSIS — K3189 Other diseases of stomach and duodenum: Secondary | ICD-10-CM | POA: Diagnosis not present

## 2018-11-20 DIAGNOSIS — Z85048 Personal history of other malignant neoplasm of rectum, rectosigmoid junction, and anus: Secondary | ICD-10-CM | POA: Diagnosis not present

## 2018-11-20 DIAGNOSIS — Z79899 Other long term (current) drug therapy: Secondary | ICD-10-CM | POA: Diagnosis not present

## 2018-11-20 DIAGNOSIS — Z8673 Personal history of transient ischemic attack (TIA), and cerebral infarction without residual deficits: Secondary | ICD-10-CM

## 2018-11-20 DIAGNOSIS — Z825 Family history of asthma and other chronic lower respiratory diseases: Secondary | ICD-10-CM | POA: Diagnosis not present

## 2018-11-20 DIAGNOSIS — K449 Diaphragmatic hernia without obstruction or gangrene: Secondary | ICD-10-CM | POA: Diagnosis not present

## 2018-11-20 DIAGNOSIS — Z8719 Personal history of other diseases of the digestive system: Secondary | ICD-10-CM | POA: Diagnosis not present

## 2018-11-20 DIAGNOSIS — Z87891 Personal history of nicotine dependence: Secondary | ICD-10-CM | POA: Diagnosis not present

## 2018-11-20 DIAGNOSIS — D62 Acute posthemorrhagic anemia: Secondary | ICD-10-CM | POA: Diagnosis not present

## 2018-11-20 DIAGNOSIS — K921 Melena: Secondary | ICD-10-CM | POA: Diagnosis not present

## 2018-11-20 DIAGNOSIS — K219 Gastro-esophageal reflux disease without esophagitis: Secondary | ICD-10-CM | POA: Diagnosis present

## 2018-11-20 DIAGNOSIS — K254 Chronic or unspecified gastric ulcer with hemorrhage: Secondary | ICD-10-CM | POA: Diagnosis not present

## 2018-11-20 DIAGNOSIS — E538 Deficiency of other specified B group vitamins: Secondary | ICD-10-CM | POA: Diagnosis present

## 2018-11-20 DIAGNOSIS — Z823 Family history of stroke: Secondary | ICD-10-CM | POA: Diagnosis not present

## 2018-11-20 DIAGNOSIS — R1310 Dysphagia, unspecified: Secondary | ICD-10-CM

## 2018-11-20 DIAGNOSIS — Z20828 Contact with and (suspected) exposure to other viral communicable diseases: Secondary | ICD-10-CM | POA: Diagnosis present

## 2018-11-20 DIAGNOSIS — G8929 Other chronic pain: Secondary | ICD-10-CM | POA: Diagnosis present

## 2018-11-20 DIAGNOSIS — R933 Abnormal findings on diagnostic imaging of other parts of digestive tract: Secondary | ICD-10-CM | POA: Diagnosis present

## 2018-11-20 DIAGNOSIS — K222 Esophageal obstruction: Principal | ICD-10-CM

## 2018-11-20 DIAGNOSIS — J449 Chronic obstructive pulmonary disease, unspecified: Secondary | ICD-10-CM | POA: Diagnosis present

## 2018-11-20 DIAGNOSIS — T39395A Adverse effect of other nonsteroidal anti-inflammatory drugs [NSAID], initial encounter: Secondary | ICD-10-CM | POA: Diagnosis not present

## 2018-11-20 DIAGNOSIS — I10 Essential (primary) hypertension: Secondary | ICD-10-CM | POA: Diagnosis present

## 2018-11-20 DIAGNOSIS — R93429 Abnormal radiologic findings on diagnostic imaging of unspecified kidney: Secondary | ICD-10-CM | POA: Diagnosis present

## 2018-11-20 DIAGNOSIS — Z8349 Family history of other endocrine, nutritional and metabolic diseases: Secondary | ICD-10-CM | POA: Diagnosis not present

## 2018-11-20 HISTORY — PX: ESOPHAGOGASTRODUODENOSCOPY (EGD) WITH PROPOFOL: SHX5813

## 2018-11-20 HISTORY — PX: BIOPSY: SHX5522

## 2018-11-20 HISTORY — PX: ESOPHAGEAL DILATION: SHX303

## 2018-11-20 LAB — CBC
HCT: 34.6 % — ABNORMAL LOW (ref 39.0–52.0)
HCT: 34.6 % — ABNORMAL LOW (ref 39.0–52.0)
Hemoglobin: 11 g/dL — ABNORMAL LOW (ref 13.0–17.0)
Hemoglobin: 10.7 g/dL — ABNORMAL LOW (ref 13.0–17.0)
MCH: 27 pg (ref 26.0–34.0)
MCH: 26.6 pg (ref 26.0–34.0)
MCHC: 31.8 g/dL (ref 30.0–36.0)
MCHC: 30.9 g/dL (ref 30.0–36.0)
MCV: 84.8 fL (ref 80.0–100.0)
MCV: 86.1 fL (ref 80.0–100.0)
Platelets: 284 10*3/uL (ref 150–400)
Platelets: 272 10*3/uL (ref 150–400)
RBC: 4.08 MIL/uL — ABNORMAL LOW (ref 4.22–5.81)
RBC: 4.02 MIL/uL — ABNORMAL LOW (ref 4.22–5.81)
RDW: 13.7 % (ref 11.5–15.5)
RDW: 13.7 % (ref 11.5–15.5)
WBC: 6.4 10*3/uL (ref 4.0–10.5)
WBC: 5.9 10*3/uL (ref 4.0–10.5)
nRBC: 0 % (ref 0.0–0.2)
nRBC: 0 % (ref 0.0–0.2)

## 2018-11-20 LAB — SARS CORONAVIRUS 2 (TAT 6-24 HRS): SARS Coronavirus 2: NEGATIVE

## 2018-11-20 LAB — HIV ANTIBODY (ROUTINE TESTING W REFLEX): HIV Screen 4th Generation wRfx: NONREACTIVE

## 2018-11-20 SURGERY — ESOPHAGOGASTRODUODENOSCOPY (EGD) WITH PROPOFOL
Anesthesia: General

## 2018-11-20 SURGERY — ESOPHAGOGASTRODUODENOSCOPY (EGD) WITH PROPOFOL
Anesthesia: Moderate Sedation

## 2018-11-20 MED ORDER — PANTOPRAZOLE SODIUM 40 MG PO TBEC
40.0000 mg | DELAYED_RELEASE_TABLET | Freq: Two times a day (BID) | ORAL | Status: DC
  Administered 2018-11-20 – 2018-11-21 (×2): 40 mg via ORAL
  Filled 2018-11-20 (×4): qty 1

## 2018-11-20 MED ORDER — PROPOFOL 500 MG/50ML IV EMUL
INTRAVENOUS | Status: DC | PRN
  Administered 2018-11-20: 200 ug/kg/min via INTRAVENOUS

## 2018-11-20 MED ORDER — LABETALOL HCL 5 MG/ML IV SOLN: 10.0000 mg | INTRAVENOUS | Status: DC | PRN

## 2018-11-20 MED ORDER — PROPOFOL 10 MG/ML IV BOLUS
INTRAVENOUS | Status: DC | PRN
  Administered 2018-11-20: 60 mg via INTRAVENOUS

## 2018-11-20 MED ORDER — GLYCOPYRROLATE 0.2 MG/ML IJ SOLN
INTRAMUSCULAR | Status: DC | PRN
  Administered 2018-11-20: 0.2 mg via INTRAVENOUS

## 2018-11-20 MED ORDER — LACTATED RINGERS IV SOLN
INTRAVENOUS | Status: DC
  Administered 2018-11-20: 15:00:00 via INTRAVENOUS

## 2018-11-20 NOTE — Progress Notes (Signed)
Consent signed and placed in front of chart.  ?

## 2018-11-20 NOTE — Anesthesia Preprocedure Evaluation (Signed)
Anesthesia Evaluation  Patient identified by MRN, date of birth, ID band Patient awake    Reviewed: Allergy & Precautions, NPO status , Patient's Chart, lab work & pertinent test results  Airway Mallampati: I  TM Distance: >3 FB Neck ROM: Full    Dental no notable dental hx. (+) Missing, Poor Dentition, Chipped   Pulmonary COPD,  COPD inhaler, former smoker,    Pulmonary exam normal breath sounds clear to auscultation       Cardiovascular Exercise Tolerance: Good hypertension, Normal cardiovascular examI Rhythm:Regular Rate:Normal     Neuro/Psych Anxiety CVA, No Residual Symptoms negative psych ROS   GI/Hepatic Neg liver ROS, GERD  Medicated and Controlled,EGD for UGI bleed    Endo/Other  negative endocrine ROS  Renal/GU negative Renal ROS  negative genitourinary   Musculoskeletal negative musculoskeletal ROS (+)   Abdominal   Peds negative pediatric ROS (+)  Hematology negative hematology ROS (+) anemia ,   Anesthesia Other Findings   Reproductive/Obstetrics negative OB ROS                             Anesthesia Physical Anesthesia Plan  ASA: III  Anesthesia Plan: General   Post-op Pain Management:    Induction: Intravenous  PONV Risk Score and Plan: 2 and Propofol infusion and TIVA  Airway Management Planned: Simple Face Mask and Nasal Cannula  Additional Equipment:   Intra-op Plan:   Post-operative Plan:   Informed Consent: I have reviewed the patients History and Physical, chart, labs and discussed the procedure including the risks, benefits and alternatives for the proposed anesthesia with the patient or authorized representative who has indicated his/her understanding and acceptance.     Dental advisory given  Plan Discussed with: CRNA  Anesthesia Plan Comments: (Plan Full PPE use  Plan GA with GETA as needed d/w pt -WTP with same after Q&A)         Anesthesia Quick Evaluation

## 2018-11-20 NOTE — Progress Notes (Signed)
Patient is resting quietly in bed with eyes closed at this time. Denies chest pain.
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
## 2018-11-20 NOTE — Transfer of Care (Signed)
Immediate Anesthesia Transfer of Care Note  Patient: AYSON BLAHNIK  Procedure(s) Performed: ESOPHAGOGASTRODUODENOSCOPY (EGD) WITH PROPOFOL (N/A ) ESOPHAGEAL DILATION BIOPSY  Patient Location: PACU  Anesthesia Type:General  Level of Consciousness: awake and patient cooperative  Airway & Oxygen Therapy: Patient Spontanous Breathing  Post-op Assessment: Report given to RN and Post -op Vital signs reviewed and stable  Post vital signs: Reviewed and stable  Last Vitals:  Vitals Value Taken Time  BP 110/76   Temp 98   Pulse 79 11/20/18 1600  Resp 20   SpO2 96 % 11/20/18 1600  Vitals shown include unvalidated device data.  Last Pain:  Vitals:   11/20/18 1502  TempSrc: Oral  PainSc: 0-No pain      Patients Stated Pain Goal: 0 ([id number])  Complications: No apparent anesthesia complications  SEE PACU FLOW SHEET FOR VITAL SIGNS

## 2018-11-20 NOTE — Op Note (Addendum)
[facility] Patient Name: Todd Berg Procedure Date: 11/20/2018 3:24 PM MRN: [id number] Date of Birth: 02-May-1950 Attending MD: Norvel Richards , MD CSN: [id number] Age: 68 Admit Type: Outpatient Procedure:                Upper GI endoscopy Indications:              Dysphagia, Melena Providers:                Norvel Richards, MD, Gwynneth Albright RN,                            RN, Aram Candela Referring MD:              Medicines:                Propofol per Anesthesia Complications:            No immediate complications. Estimated Blood Loss:     Estimated blood loss was minimal. Procedure:                Pre-Anesthesia Assessment:                           - Prior to the procedure, a History and Physical                            was performed, and patient medications and                            allergies were reviewed. The patient's tolerance of                            previous anesthesia was also reviewed. The risks                            and benefits of the procedure and the sedation                            options and risks were discussed with the patient.                            All questions were answered, and informed consent                            was obtained. Prior Anticoagulants: The patient has                            taken no previous anticoagulant or antiplatelet                            agents. ASA Grade Assessment: III - A patient with                            severe systemic disease. After reviewing the risks  and benefits, the patient was deemed in                            satisfactory condition to undergo the procedure.                           After obtaining informed consent, the endoscope was                            passed under direct vision. Throughout the                            procedure, the patient's blood pressure, pulse, and                            oxygen  saturations were monitored continuously. The                            [id number]-[id number]) scope was introduced through the                            mouth, and advanced to the second part of duodenum.                            The upper GI endoscopy was accomplished without                            difficulty. The patient tolerated the procedure                            well. Scope In: 3:45:02 PM Scope Out: 3:51:29 PM Total Procedure Duration: 0 hours 6 minutes 27 seconds  Findings:      A moderate Schatzki ring was found at the gastroesophageal junction.       Esophagus otherwise appeared normal.      A medium-sized hiatal hernia was present. Patient had a single 8 mm       elliptical ulcer with clean base in the superior aspect of the       prepyloric antrum. Please see photograph. . Pylorus was patent.      The duodenal bulb and second portion of the duodenum were normal. The       scope was withdrawn. Dilation was performed with a Maloney dilator with       mild resistance at 56 Fr. The dilation site was examined following       endoscope reinsertion and showed moderate improvement in luminal       narrowing. Estimated blood loss was minimal. Ulcer was biopsied Impression:               - Moderate Schatzki ring. Dilated.                           - Medium-sized hiatal hernia. Elliptical antral                            gastric ulcer - status post biopsy`                           -  Normal duodenal bulb and second portion of the                            duodenum.                           - No specimens collected. Moderate Sedation:      Moderate (conscious) sedation was personally administered by an       anesthesia professional. The following parameters were monitored: oxygen       saturation, heart rate, blood pressure, respiratory rate, EKG, adequacy       of pulmonary ventilation, and response to care. Recommendation:           - Patient has a contact number available  for                            emergencies. The signs and symptoms of potential                            delayed complications were discussed with the                            patient. Return to normal activities tomorrow.                            Written discharge instructions were provided to the                            patient.                           - Advance diet as tolerated.                           - Continue present medications. Continue Protonix                            40 mg twice daily. Follow-up on pathology. Avoid                            all forms of nonsteroidal agents                           - Repeat upper endoscopy in 3 months for                            surveillance.                           - Follow-up with Dr. Paulita Fujita in [place] 3 months Procedure Code(s):        --- Professional ---                           [phone], Esophagogastroduodenoscopy, flexible,  transoral; diagnostic, including collection of                            specimen(s) by brushing or washing, when performed                            (separate procedure)                           43450, Dilation of esophagus, by unguided sound or                            bougie, single or multiple passes Diagnosis Code(s):        --- Professional ---                           K22.2, Esophageal obstruction                           K44.9, Diaphragmatic hernia without obstruction or                            gangrene                           R13.10, Dysphagia, unspecified                           K92.1, Melena (includes Hematochezia) CPT copyright 2019 American Medical Association. All rights reserved. The codes documented in this report are preliminary and upon coder review may  be revised to meet current compliance requirements. Cristopher Estimable. Pal Shell, MD Norvel Richards, MD 11/20/2018 4:06:05 PM This report has been signed electronically. Number of Addenda:  0

## 2018-11-20 NOTE — Progress Notes (Signed)
Patient Demographics:    Todd Berg, is a 68 y.o. male, DOB - May 25, 1950, [id number]  Admit date - 11/19/2018   Admitting Physician Ejiroghene Arlyce Dice, MD  Outpatient Primary MD for the patient is Dettinger, Todd Kaufmann, MD  LOS - 0   Chief Complaint  Patient presents with   GI Bleeding        Subjective:    Todd Berg today has no fevers, no emesis,  No chest pain,   -Tolerated EGD well, patient wants to eat  Assessment  & Plan :    Active Problems:   Iron deficiency anemia due to chronic blood loss   Melena   H/O: CVA (cerebrovascular accident)   Upper GI bleed   GI bleed  -Brief summary 68 year old male with history of prior colorectal cancer, COPD, HTN and PUD s/p multiple EGDs in past (most recently October 2019 while hospitalized in [place]), presenting with recurrent melena, heme positive stool in the setting of NSAID use and presumed noncompliance with PPI - A/p 1) history of recurrent GI bleed/acute GI bleed secondary to elliptical antral gastric ulcer--- EGD with biopsy on 11/20/2018 noted with elliptical antral gastric ulcer--continue Protonix,  -Monitor H&H and transfuse as clinically indicated -Okay to start liquid diet  2) gallbladder polyp versus Gallstone--outpatient follow-up with general surgeon Dr. Constance Haw advised  3) history of colo-rectal cancer--- last colonoscopy 2017, outpatient follow-up with GI physician Dr. Paulita Fujita in [place] strongly advised  4) acute blood loss anemia--managed as above #1  5) hypertension--stable,  PTA was on Norvasc  6) COPD--- stable, no acute exacerbation  2)HTN-stable, Norvasc on hold  3)COPD-no acute exacerbation, stable  Disposition/Need for in-Hospital Stay- patient unable to be discharged at this time due to -GI bleed in the setting of gastric ulcer requiring IV PPI and need to advance diet slowly  Code Status :  Full code  Family Communication:   NA (patient is alert, awake and coherent)   Disposition Plan  : Possible discharge in a.m. if H&H stable and tolerating oral intake  Consults  : GI  DVT Prophylaxis  :   SCDs   Lab Results  Component Value Date   PLT 272 11/20/2018    Inpatient Medications  Scheduled Meds:  pantoprazole  40 mg Oral BID AC   Continuous Infusions: PRN Meds:.acetaminophen **OR** acetaminophen, alum & mag hydroxide-simeth, gabapentin, labetalol, ondansetron **OR** ondansetron (ZOFRAN) IV, polyethylene glycol    Anti-infectives (From admission, onward)   None        Objective:   Vitals:   11/20/18 1600 11/20/18 1615 11/20/18 1646 11/20/18 1808  BP: 110/76 110/68 (!) 111/95 (!) 165/95  Pulse: 79 75 72 79  Resp: (!) 27 17 20 20   Temp: 98 F (36.7 C)   97.7 F (36.5 C)  TempSrc:    Oral  SpO2: 96% 99% 100% 100%  Weight:      Height:        Wt Readings from Last 3 Encounters:  11/19/18 108.9 kg  11/14/18 92.1 kg  11/02/18 92.3 kg     Intake/Output Summary (Last 24 hours) at 11/20/2018 1929 Last data filed at 11/20/2018 1700 Gross per 24 hour  Intake 1089.36 ml  Output 0 ml  Net 1089.36 ml  Physical Exam  Gen:- Awake Alert, in no acute distress HEENT:- [place].AT, No sclera icterus Neck-Supple Neck,No JVD,.  Lungs-  CTAB , fair symmetrical air movement CV- S1, S2 normal, regular  Abd-  +ve B.Sounds, Abd Soft, epigastric tenderness,    Extremity/Skin:- No  edema, pedal pulses present  Psych-affect is appropriate, oriented x3 Neuro-no new focal deficits, no tremors   Data Review:   Micro Results Recent Results (from the past 240 hour(s))  SARS CORONAVIRUS 2 (TAT 6-24 HRS) Nasopharyngeal Nasopharyngeal Swab     Status: None   Collection Time: 11/19/18  5:40 PM   Specimen: Nasopharyngeal Swab  Result Value Ref Range Status   SARS Coronavirus 2 NEGATIVE NEGATIVE Final    Comment: (NOTE) SARS-CoV-2 target nucleic acids are NOT  DETECTED. The SARS-CoV-2 RNA is generally detectable in upper and lower respiratory specimens during the acute phase of infection. Negative results do not preclude SARS-CoV-2 infection, do not rule out co-infections with other pathogens, and should not be used as the sole basis for treatment or other patient management decisions. Negative results must be combined with clinical observations, patient history, and epidemiological information. The expected result is Negative. Fact Sheet for Patients: SugarRoll.be Fact Sheet for Healthcare Providers: https://www.woods-mathews.com/ This test is not yet approved or cleared by the [place] FDA and  has been authorized for detection and/or diagnosis of SARS-CoV-2 by FDA under an Emergency Use Authorization (EUA). This EUA will remain  in effect (meaning this test can be used) for the duration of the COVID-19 declaration under Section 56 4(b)(1) of the Act, 21 U.S.C. section 360bbb-3(b)(1), unless the authorization is terminated or revoked sooner. Performed at [facility], [place] [address], [place], [place] [address]     Radiology Reports Ct Abdomen Pelvis W Contrast  Result Date: 10/31/2018 CLINICAL DATA:  Right upper quadrant pain, cholecystitis suspected. EXAM: CT ABDOMEN AND PELVIS WITH CONTRAST TECHNIQUE: Multidetector CT imaging of the abdomen and pelvis was performed using the standard protocol following bolus administration of intravenous contrast. CONTRAST:  150mL OMNIPAQUE IOHEXOL 300 MG/ML  SOLN COMPARISON:  11/20/2016 FINDINGS: Lower chest: No sign of consolidation. Basilar atelectasis, no pleural or pericardial effusion. Heart is incompletely imaged. Hepatobiliary: Small low-density lesion in the medial section left hepatic lobe likely small cysts. No signs of suspicious mass or acute finding related to the liver biliary tree. Specifically, the gallbladder displays no distension,  pericholecystic stranding or pericholecystic fluid. Potential small stone versus polyp in the neck of the gallbladder. Pancreas: Unremarkable. No pancreatic ductal dilatation or surrounding inflammatory changes. Spleen: Normal in size without focal abnormality. Adrenals/Urinary Tract: Adrenal glands are normal. Tiny exophytic renal lesion on the right measures 7 mm (image 26, series 2) and shows a density of approximately 94 Hounsfield units though density values are difficult to interpret given small size. Left kidney with small cysts which are unchanged. No signs of hydronephrosis. Stomach/Bowel: Normal appendix. No signs of bowel obstruction or acute bowel process. Vascular/Lymphatic: Patent abdominal vasculature. No signs of significant atherosclerotic change or aneurysm. No signs of adenopathy in the upper abdomen or retroperitoneum. No signs of pelvic adenopathy. Reproductive: Prostate heterogeneity, nonspecific by CT. Urinary bladder is unremarkable. Other: No ascites, free air or hernia. Musculoskeletal: No acute bone finding or destructive bone process. Signs of L5-S1 spinal fusion are similar to prior exam. IMPRESSION: 1. No cause for the patient's right upper quadrant pain identified. Normal appendix and normal gallbladder. 2. Potential small solid renal neoplasm versus is hyperdense renal cyst  arising from upper pole of right kidney appears more discrete than on the study of 2018, suggest dedicated renal CT evaluation on follow-up. 3. Potential small stone versus polyp in the neck of the gallbladder. Ultrasound could better evaluate if clinically warranted. These results were called by telephone at the time of interpretation on 10/31/2018 at 4:27 pm to provider [facility] , who verbally acknowledged these results. Electronically Signed   By: Zetta Bills M.D.   On: 10/31/2018 16:28     CBC Recent Labs  Lab 11/19/18 1415 11/19/18 2007 11/20/18 0230 11/20/18 0825  WBC 8.3  --  6.4 5.9  HGB  11.8* 11.9* 11.0* 10.7*  HCT 38.5* 37.8* 34.6* 34.6*  PLT 317  --  284 272  MCV 85.9  --  84.8 86.1  MCH 26.3  --  27.0 26.6  MCHC 30.6  --  31.8 30.9  RDW 13.5  --  13.7 13.7  LYMPHSABS 1.4  --   --   --   MONOABS 0.7  --   --   --   EOSABS 0.1  --   --   --   BASOSABS 0.1  --   --   --     Chemistries  Recent Labs  Lab 11/19/18 1415  NA 136  K 4.0  CL 107  CO2 22  GLUCOSE 97  BUN 43*  CREATININE 0.84  CALCIUM 8.6*  AST 12*  ALT 11  ALKPHOS 72  BILITOT 0.7   ------------------------------------------------------------------------------------------------------------------ No results for input(s): CHOL, HDL, LDLCALC, TRIG, CHOLHDL, LDLDIRECT in the last 72 hours.  Lab Results  Component Value Date   HGBA1C 5.8 08/02/2017   ------------------------------------------------------------------------------------------------------------------ No results for input(s): TSH, T4TOTAL, T3FREE, THYROIDAB in the last 72 hours.  Invalid input(s): FREET3 ------------------------------------------------------------------------------------------------------------------ No results for input(s): VITAMINB12, FOLATE, FERRITIN, TIBC, IRON, RETICCTPCT in the last 72 hours.  Coagulation profile No results for input(s): INR, PROTIME in the last 168 hours.  No results for input(s): DDIMER in the last 72 hours.  Cardiac Enzymes No results for input(s): CKMB, TROPONINI, MYOGLOBIN in the last 168 hours.  Invalid input(s): CK ------------------------------------------------------------------------------------------------------------------ No results found for: BNP   Roxan Hockey M.D on 11/20/2018 at 7:29 PM  Go to [email or web address] - for contact info  [facility] - Office  [phone]

## 2018-11-20 NOTE — Anesthesia Postprocedure Evaluation (Signed)
Anesthesia Post Note  Patient: Todd Berg  Procedure(s) Performed: ESOPHAGOGASTRODUODENOSCOPY (EGD) WITH PROPOFOL (N/A ) ESOPHAGEAL DILATION BIOPSY  Patient location during evaluation: PACU Anesthesia Type: General Level of consciousness: awake and alert and patient cooperative Pain management: satisfactory to patient Vital Signs Assessment: post-procedure vital signs reviewed and stable Respiratory status: spontaneous breathing Cardiovascular status: stable Postop Assessment: no apparent nausea or vomiting Anesthetic complications: no     Last Vitals:  Vitals:   11/20/18 1502 11/20/18 1600  BP: 131/81 110/76  Pulse: 72 79  Resp: 20 (!) 27  Temp: 36.6 C 36.7 C  SpO2: 97%     Last Pain:  Vitals:   11/20/18 1502  TempSrc: Oral  PainSc: 0-No pain                 Wladyslawa Disbro

## 2018-11-20 NOTE — Anesthesia Procedure Notes (Signed)
Date/Time: 11/20/2018 3:39 PM Performed by: Vista Deck, CRNA Pre-anesthesia Checklist: Patient identified, Emergency Drugs available, Suction available, Timeout performed and Patient being monitored Patient Re-evaluated:Patient Re-evaluated prior to induction Oxygen Delivery Method: Nasal Cannula

## 2018-11-21 ENCOUNTER — Encounter (HOSPITAL_COMMUNITY): Payer: Self-pay | Admitting: Internal Medicine

## 2018-11-21 DIAGNOSIS — K254 Chronic or unspecified gastric ulcer with hemorrhage: Secondary | ICD-10-CM

## 2018-11-21 DIAGNOSIS — K921 Melena: Secondary | ICD-10-CM

## 2018-11-21 LAB — BASIC METABOLIC PANEL
Anion gap: 8 (ref 5–15)
BUN: 28 mg/dL — ABNORMAL HIGH (ref 8–23)
CO2: 23 mmol/L (ref 22–32)
Calcium: 8.8 mg/dL — ABNORMAL LOW (ref 8.9–10.3)
Chloride: 109 mmol/L (ref 98–111)
Creatinine, Ser: 0.95 mg/dL (ref 0.61–1.24)
GFR calc Af Amer: >60 mL/min (ref >60)
GFR calc non Af Amer: >60 mL/min (ref >60)
Glucose, Bld: 95 mg/dL (ref 70–99)
Potassium: 3.8 mmol/L (ref 3.5–5.1)
Sodium: 140 mmol/L (ref 135–145)

## 2018-11-21 LAB — CBC
HCT: 32.6 % — ABNORMAL LOW (ref 39.0–52.0)
Hemoglobin: 10.1 g/dL — ABNORMAL LOW (ref 13.0–17.0)
MCH: 26.8 pg (ref 26.0–34.0)
MCHC: 31 g/dL (ref 30.0–36.0)
MCV: 86.5 fL (ref 80.0–100.0)
Platelets: 257 10*3/uL (ref 150–400)
RBC: 3.77 MIL/uL — ABNORMAL LOW (ref 4.22–5.81)
RDW: 13.7 % (ref 11.5–15.5)
WBC: 6.3 10*3/uL (ref 4.0–10.5)
nRBC: 0 % (ref 0.0–0.2)

## 2018-11-21 MED ORDER — PANTOPRAZOLE SODIUM 40 MG PO TBEC: 40.0000 mg | DELAYED_RELEASE_TABLET | Freq: Two times a day (BID) | ORAL | 3 refills | Status: DC

## 2018-11-21 MED ORDER — ONDANSETRON HCL 4 MG PO TABS: 4.0000 mg | ORAL_TABLET | Freq: Four times a day (QID) | ORAL | 0 refills | Status: DC | PRN

## 2018-11-21 NOTE — Discharge Instructions (Signed)
1)Avoid ibuprofen/Advil/Aleve/Motrin/Goody Powders/Naproxen/BC powders/Meloxicam/Diclofenac/Indomethacin and other Nonsteroidal anti-inflammatory medications as these will make you more likely to bleed and can cause stomach ulcers, can also cause Kidney problems.   2) Please avoid coffee/caffeinated beverages, Tea, chocolate, carbonated drinks/soda,  spicy food, Milk,  Alcohol, acidic foods- such as citrus (Lemon, oranges), juices  and tomatoes, Meats with a high fat content. High-fat condiments and  Fried foods  3) repeat CBC and BMP blood test with your primary care physician within a week  4) follow-up with gastroenterologist Dr. Gala Romney 2 to 3 weeks for recheck and reevaluation

## 2018-11-21 NOTE — Discharge Summary (Signed)
Todd Berg, is a 68 y.o. male  DOB 31-Jan-1950  MRN [id number].  Admission date:  11/19/2018  Admitting Physician  Bethena Roys, MD  Discharge Date:  11/21/2018   Primary MD  Dettinger, [name], MD  Recommendations for primary care physician for things to follow:    1)Avoid ibuprofen/Advil/Aleve/Motrin/Goody Powders/Naproxen/BC powders/Meloxicam/Diclofenac/Indomethacin and other Nonsteroidal anti-inflammatory medications as these will make you more likely to bleed and can cause stomach ulcers, can also cause Kidney problems.   2) Please avoid coffee/caffeinated beverages, Tea, chocolate, carbonated drinks/soda,  spicy food, Milk,  Alcohol, acidic foods- such as citrus (Lemon, oranges), juices  and tomatoes, Meats with a high fat content. High-fat condiments and  Fried foods  3) repeat CBC and BMP blood test with your primary care physician within a week  4) follow-up with gastroenterologist Dr. Gala Romney 2 to 3 weeks for recheck and reevaluation  Admission Diagnosis  Melena [K92.1] Upper GI bleed [K92.2]   Discharge Diagnosis  Melena [K92.1] Upper GI bleed [K92.2]    Active Problems:   Iron deficiency anemia due to chronic blood loss   Melena   H/O: CVA (cerebrovascular accident)   Upper GI bleed   GI bleed      Past Medical History:  Diagnosis Date   Cancer ([place]) 2015   rectal   Chronic back pain    COPD (chronic obstructive pulmonary disease) (HCC)    GERD (gastroesophageal reflux disease)    Hematemesis    Hemorrhoids    Hyperlipidemia    Hypotension    Melena    Melena 10/12/2017   Stroke ([place]) 2019    Past Surgical History:  Procedure Laterality Date   BACK SURGERY     BIOPSY  10/13/2017   Procedure: BIOPSY;  Surgeon: Laurence Spates, MD;  Location: [facility] ENDOSCOPY;  Service: Endoscopy;;   COLONOSCOPY  08/2015   Dr. [name]: normal    ESOPHAGOGASTRODUODENOSCOPY N/A 06/09/2016   Dr. Wilford Corner: LA Grade A reflux esophagitis, non-bleeding gastric ulcers s/p epi. Large amount of old blood in dependent portions of stomach. H.pylori serology: negative.    ESOPHAGOGASTRODUODENOSCOPY (EGD) WITH PROPOFOL Left 11/21/2016   Dr. Paulita Fujita: small hiatal hernia, benign-appearing esophageal stenosis, non-bleeding gastric ulcer without stigmata of bleeding. 6 mm in largest dimension. Gastritis. Normal duodenum. Felt to be NSAID-induced.    ESOPHAGOGASTRODUODENOSCOPY (EGD) WITH PROPOFOL N/A 10/13/2017   Dr. Oletta Lamas: large hiatal hernia, one cratered gastric ulcer in preplyoric region of stomach, 8 mm in largest dimension s/p biopsy. Negative H.pylori   HEMORRHOID SURGERY     HERNIA REPAIR     RECTAL SURGERY         HPI  from the history and physical done on the day of admission:   -Chief Complaint: Black stools  HPI: Todd Berg is a 68 y.o. male with medical history significant for gastric ulcer, COPD, hypertension, rectal cancer.  Patient presented to the ED with complaints of at least 5 episodes of black loose stools that started today.  Reports stools  are black like tar.  He denies vomiting, no abdominal pain.  No blood in stools.  He reports some dizziness earlier in the day when this started, but no dizziness now.  No chest pain or difficulty breathing. Patient reports he has been taking ibuprofen 400 mg a day, at least 4 days a week, for headaches.   ED Course:.  Stable vitals.  Hemoglobin close to baseline at 11.8.  Stool FOBT positive. EDP talked to Dr. Gala Romney, will see patient in a.m.  Review of Systems: As per HPI all other systems reviewed and negative.      Hospital Course:   -Brief summary 68 year old male with history of PUD s/p multiple EGDs in past (most recently October 2019 while hospitalized in [place]), presenting with recurrent melena, heme positive stool in the setting of NSAID use and presumed  noncompliance with PPI - A/p 1) history of recurrent GI bleed/acute GI bleed secondary to elliptical antral gastric ulcer--- EGD report from 11/20/2018 noted--treated with Protonix, H&H stable (10.1)--- repeat EGD in 3 months with Dr. Paulita Fujita in [place] advised -eating and drinking well per GI service okay to discharge home --- Protonix -Follow-up with GI physician Dr. Gala Romney in 2 weeks  2) gallbladder polyp versus Gallstone--outpatient follow-up with general surgeon Dr. Constance Haw advised  3) history of colonrectal cancer--- last colonoscopy 2017, outpatient follow-up with GI physician Dr. Paulita Fujita in [place] strongly advised  4) acute blood loss anemia--managed as above #1  5) hypertension--stable, okay to resume Norvasc  6) COPD--- stable, no acute exacerbation  Discharge Condition: stable  Follow UP- -Follow-up with GI physician Dr. Gala Romney in 2 weeks -Also follow-up with Dr. Paulita Fujita in 3 months for repeat EGD  Consults obtained - Gi  Diet and Activity recommendation:  As advised  Discharge Instructions    Discharge Instructions    Call MD for:  difficulty breathing, headache or visual disturbances   Complete by: As directed    Call MD for:  persistant dizziness or light-headedness   Complete by: As directed    Call MD for:  persistant nausea and vomiting   Complete by: As directed    Call MD for:  temperature >100.4   Complete by: As directed    Diet - low sodium heart healthy   Complete by: As directed    Please avoid coffee/caffeinated beverages, Tea, chocolate, carbonated drinks/soda,  spicy food, Milk,  Alcohol, acidic foods- such as citrus (Lemon, oranges), juices  and tomatoes, Meats with a high fat content. High-fat condiments and  Fried foods   Discharge instructions   Complete by: As directed    1)Avoid ibuprofen/Advil/Aleve/Motrin/Goody Powders/Naproxen/BC powders/Meloxicam/Diclofenac/Indomethacin and other Nonsteroidal anti-inflammatory medications as these will  make you more likely to bleed and can cause stomach ulcers, can also cause Kidney problems.   2) Please avoid coffee/caffeinated beverages, Tea, chocolate, carbonated drinks/soda,  spicy food, Milk,  Alcohol, acidic foods- such as citrus (Lemon, oranges), juices  and tomatoes, Meats with a high fat content. High-fat condiments and  Fried foods  3) repeat CBC and BMP blood test with your primary care physician within a week  4) follow-up with gastroenterologist Dr. Gala Romney 2 to 3 weeks for recheck and reevaluation   Increase activity slowly   Complete by: As directed         Discharge Medications     Allergies as of 11/21/2018   No Known Allergies     Medication List    TAKE these medications   acetaminophen 500 MG tablet Commonly  known as: TYLENOL Take 1 tablet (500 mg total) by mouth every 6 (six) hours as needed.   amLODipine 2.5 MG tablet Commonly known as: NORVASC Take 1 tablet (2.5 mg total) by mouth daily.   B-12 [phone] MCG Subl Place 1 tablet under the tongue daily.   gabapentin 100 MG capsule Commonly known as: NEURONTIN Take 1 capsule (100 mg total) by mouth 3 (three) times daily. What changed:   when to take this  reasons to take this  additional instructions   nystatin ointment Commonly known as: MYCOSTATIN Apply 1 application topically 2 (two) times daily.   ondansetron 4 MG tablet Commonly known as: ZOFRAN Take 1 tablet (4 mg total) by mouth every 6 (six) hours as needed for nausea.   pantoprazole 40 MG tablet Commonly known as: PROTONIX Take 1 tablet (40 mg total) by mouth 2 (two) times daily before a meal. What changed: when to take this   potassium chloride SA 20 MEQ tablet Commonly known as: KLOR-CON Take 1 tablet (20 mEq total) by mouth 2 (two) times daily.   sildenafil 20 MG tablet Commonly known as: REVATIO Take 1-3 tablets (20-60 mg total) by mouth as needed.   tetrahydrozoline 0.05 % ophthalmic solution Place 1 drop into both  eyes every other day.   triamcinolone cream 0.1 % Commonly known as: KENALOG Apply 1 application topically 2 (two) times daily.       Major procedures and Radiology Reports - PLEASE review detailed and final reports for all details, in brief -   Ct Abdomen Pelvis W Contrast  Result Date: 10/31/2018 CLINICAL DATA:  Right upper quadrant pain, cholecystitis suspected. EXAM: CT ABDOMEN AND PELVIS WITH CONTRAST TECHNIQUE: Multidetector CT imaging of the abdomen and pelvis was performed using the standard protocol following bolus administration of intravenous contrast. CONTRAST:  147mL OMNIPAQUE IOHEXOL 300 MG/ML  SOLN COMPARISON:  11/20/2016 FINDINGS: Lower chest: No sign of consolidation. Basilar atelectasis, no pleural or pericardial effusion. Heart is incompletely imaged. Hepatobiliary: Small low-density lesion in the medial section left hepatic lobe likely small cysts. No signs of suspicious mass or acute finding related to the liver biliary tree. Specifically, the gallbladder displays no distension, pericholecystic stranding or pericholecystic fluid. Potential small stone versus polyp in the neck of the gallbladder. Pancreas: Unremarkable. No pancreatic ductal dilatation or surrounding inflammatory changes. Spleen: Normal in size without focal abnormality. Adrenals/Urinary Tract: Adrenal glands are normal. Tiny exophytic renal lesion on the right measures 7 mm (image 26, series 2) and shows a density of approximately 94 Hounsfield units though density values are difficult to interpret given small size. Left kidney with small cysts which are unchanged. No signs of hydronephrosis. Stomach/Bowel: Normal appendix. No signs of bowel obstruction or acute bowel process. Vascular/Lymphatic: Patent abdominal vasculature. No signs of significant atherosclerotic change or aneurysm. No signs of adenopathy in the upper abdomen or retroperitoneum. No signs of pelvic adenopathy. Reproductive: Prostate heterogeneity,  nonspecific by CT. Urinary bladder is unremarkable. Other: No ascites, free air or hernia. Musculoskeletal: No acute bone finding or destructive bone process. Signs of L5-S1 spinal fusion are similar to prior exam. IMPRESSION: 1. No cause for the patient's right upper quadrant pain identified. Normal appendix and normal gallbladder. 2. Potential small solid renal neoplasm versus is hyperdense renal cyst arising from upper pole of right kidney appears more discrete than on the study of 2018, suggest dedicated renal CT evaluation on follow-up. 3. Potential small stone versus polyp in the neck of the gallbladder. Ultrasound could  better evaluate if clinically warranted. These results were called by telephone at the time of interpretation on 10/31/2018 at 4:27 pm to provider [facility] , who verbally acknowledged these results. Electronically Signed   By: Zetta Bills M.D.   On: 10/31/2018 16:28    Micro Results    Recent Results (from the past 240 hour(s))  SARS CORONAVIRUS 2 (TAT 6-24 HRS) Nasopharyngeal Nasopharyngeal Swab     Status: None   Collection Time: 11/19/18  5:40 PM   Specimen: Nasopharyngeal Swab  Result Value Ref Range Status   SARS Coronavirus 2 NEGATIVE NEGATIVE Final    Comment: (NOTE) SARS-CoV-2 target nucleic acids are NOT DETECTED. The SARS-CoV-2 RNA is generally detectable in upper and lower respiratory specimens during the acute phase of infection. Negative results do not preclude SARS-CoV-2 infection, do not rule out co-infections with other pathogens, and should not be used as the sole basis for treatment or other patient management decisions. Negative results must be combined with clinical observations, patient history, and epidemiological information. The expected result is Negative. Fact Sheet for Patients: SugarRoll.be Fact Sheet for Healthcare Providers: https://www.woods-mathews.com/ This test is not yet approved or  cleared by the [place] FDA and  has been authorized for detection and/or diagnosis of SARS-CoV-2 by FDA under an Emergency Use Authorization (EUA). This EUA will remain  in effect (meaning this test can be used) for the duration of the COVID-19 declaration under Section 56 4(b)(1) of the Act, 21 U.S.C. section 360bbb-3(b)(1), unless the authorization is terminated or revoked sooner. Performed at [facility], [place] [address] [place], [place] [address]        Today   Subjective    Todd Berg today has no new complaints   - eating and drinking well, No emesis        Patient has been seen and examined prior to discharge   Objective   Blood pressure (!) 143/83, pulse 75, temperature 98 F (36.7 C), temperature source Oral, resp. rate 20, height 6' (1.829 m), weight 108.9 kg, SpO2 97 %.   Intake/Output Summary (Last 24 hours) at 11/21/2018 1230 Last data filed at 11/21/2018 0300 Gross per 24 hour  Intake 640 ml  Output 0 ml  Net 640 ml    Exam Gen:- Awake Alert, no acute distress  HEENT:- [place].AT, No sclera icterus Neck-Supple Neck,No JVD,.  Lungs-  CTAB , good air movement bilaterally  CV- S1, S2 normal, regular Abd-  +ve B.Sounds, Abd Soft, mostly resolved epigastric tenderness,    Extremity/Skin:- No  edema,   good pulses Psych-affect is appropriate, oriented x3 Neuro-no new focal deficits, no tremors    Data Review   CBC w Diff:  Lab Results  Component Value Date   WBC 6.3 11/21/2018   HGB 10.1 (L) 11/21/2018   HGB 12.6 (L) 11/02/2018   HCT 32.6 (L) 11/21/2018   HCT 39.9 11/02/2018   PLT 257 11/21/2018   PLT 276 11/02/2018   LYMPHOPCT 17 11/19/2018   MONOPCT 8 11/19/2018   EOSPCT 1 11/19/2018   BASOPCT 1 11/19/2018    CMP:  Lab Results  Component Value Date   NA 140 11/21/2018   NA 141 11/02/2018   K 3.8 11/21/2018   CL 109 11/21/2018   CO2 23 11/21/2018   BUN 28 (H) 11/21/2018   BUN 16 11/02/2018   CREATININE 0.95 11/21/2018    PROT 6.9 11/19/2018   PROT 6.6 11/02/2018   ALBUMIN 3.4 (L) 11/19/2018  ALBUMIN 3.9 11/02/2018   BILITOT 0.7 11/19/2018   BILITOT 0.2 11/02/2018   ALKPHOS 72 11/19/2018   AST 12 (L) 11/19/2018   ALT 11 11/19/2018  .   Total Discharge time is about 33 minutes  Roxan Hockey M.D on 11/21/2018 at 12:30 PM  Go to [email or web address] -  for contact info  [facility] - Office  [phone]

## 2018-11-21 NOTE — Plan of Care (Signed)

## 2018-11-21 NOTE — Progress Notes (Signed)
Subjective: No melena today. Thinks his last BM was a cople of days ago. No abdominal pain. No nausea or vomiting. Ate breakfast this morning without trouble. Main concern is a headache at this time.  Objective: Vital signs in last 24 hours: Temp:  [97.7 F (36.5 C)-98.1 F (36.7 C)] 98 F (36.7 C) (11/10 0523) Pulse Rate:  [69-79] 75 (11/10 0523) Resp:  [17-27] 20 (11/10 0523) BP: (110-165)/(68-95) 143/83 (11/10 0523) SpO2:  [96 %-100 %] 97 % (11/10 0523) Last BM Date: 11/20/18 General:   Alert and oriented, pleasant Head:  Normocephalic and atraumatic. Eyes:  No icterus, sclera clear. Conjuctiva pink.  Abdomen:  Bowel sounds present, soft, non-tender, non-distended. No HSM or hernias noted. No rebound or guarding. No masses appreciated  Msk:  Symmetrical without gross deformities. Normal posture. Extremities:  Without edema. Neurologic:  Alert . Oriented to self. Knows his name but not his birthday. Knows he is at the hospital and why but thinks he is in [place]. Unable to tell me the year. He was reoriented.  Skin:  Warm and dry, intact without significant lesions.  Psych:  Normal mood and affect.   Intake/Output from previous day: 11/09 0701 - 11/10 0700 In: 640 [P.O.:340; I.V.:300] Out: 0  Intake/Output this shift: No intake/output data recorded.  Lab Results: Recent Labs    11/20/18 0230 11/20/18 0825 11/21/18 0516  WBC 6.4 5.9 6.3  HGB 11.0* 10.7* 10.1*  HCT 34.6* 34.6* 32.6*  PLT 284 272 257   BMET Recent Labs    11/19/18 1415 11/21/18 0516  NA 136 140  K 4.0 3.8  CL 107 109  CO2 22 23  GLUCOSE 97 95  BUN 43* 28*  CREATININE 0.84 0.95  CALCIUM 8.6* 8.8*   LFT Recent Labs    11/19/18 1415  PROT 6.9  ALBUMIN 3.4*  AST 12*  ALT 11  ALKPHOS 30  BILITOT 0.7   Assessment: 68 year old male with history of PUD s/p multiple EGDs in past (most recently October 2019 while hospitalized in [place]), presenting with recurrent melena, heme  positive stool. This was in the setting of NSAID use outpatient and questionable compliance of outpatient PPI. Admitted to GERD and dysphagia. For past year, Hgb has been in the 11-12 range but drifted down to 10.7 on 11/20/18. He ultimately underwent EGD with dilation on 11/20/18 that revealed elliptical antral gastric ulcer with clean base s/p biopsy, moderate Schatzki ring dilated, medium sized hiatal hernia, otherwise normal.  Recommendations to advance diet as tolerated, continue Protonix twice daily, avoid all NSAIDs, and repeat upper endoscopy in 3 months for surveillance with Dr. Paulita Fujita in [place].  Today, hemoglobin slightly lower at 10.1 but overall stable.  Patient tolerated a normal diet this morning and is without any further melena thus far. No other GI complaints.  Continue Protonix 40 mg twice daily.  Possible calculus vs polyp in gallbladder neck on outpatient imaging Oct 2020 and abnormal renal findings: continue follow-up with Dr. Constance Haw.  Currently asymptomatic. Asymptomatic today. Possible small solid renal neoplasm on CT with PCP aware and US renal to be pursued as outpatient.   Personal history of colorectal cancer in remote past: normal colonoscopy 2017. Desires to follow-up with Dr. Paulita Fujita  Plan: Continue Protonix 40 mg twice daily. Avoid all NSAIDs. Follow-up with Dr. Paulita Fujita in [place].  Will need surveillance EGD in 3 months. Keep plans for outpatient follow-up with surgery (Dr. Constance Haw) and urology (Dr. Diona Fanti)    LOS: 1 day  11/21/2018, 8:32 AM   Aliene Altes, PA-C [facility]

## 2018-11-21 NOTE — Progress Notes (Signed)
Discharge instructions given to pt. Verbalized understanding of meds and f/u appts. Left unit via w/c. Wife [name] to drive home.

## 2018-11-22 ENCOUNTER — Telehealth: Payer: Self-pay | Admitting: Family Medicine

## 2018-11-22 ENCOUNTER — Encounter: Payer: Self-pay | Admitting: Internal Medicine

## 2018-11-22 LAB — SURGICAL PATHOLOGY

## 2018-11-22 NOTE — Telephone Encounter (Signed)
Left message to call back  

## 2018-11-23 NOTE — Telephone Encounter (Signed)
Appointment scheduled for 11/30/18. °

## 2018-11-27 ENCOUNTER — Telehealth: Payer: Self-pay | Admitting: General Surgery

## 2018-11-27 NOTE — Telephone Encounter (Signed)
[facility]  Called patient to see how he was doing.  Spoke with the wife. He has been in the hospital and was found to have a esophagus stricture and ulcers in his stomach. He had and EGD that confirmed these diagnosis.    He still having some RUQ pain. The patient's wife says they have been trying to get the heat treatment for bed bugs but needed to get $900.  I have told the wife, I am happy to take out Mr. Houtz gallbladder if he is still having problems. They are to let me know what they want to do and how they want to proceed.    Curlene Labrum, MD [facility] [address] [place], [place] [address] [phone] (office)

## 2018-11-28 ENCOUNTER — Ambulatory Visit: Payer: Medicare Other | Admitting: Urology

## 2018-11-30 ENCOUNTER — Ambulatory Visit (INDEPENDENT_AMBULATORY_CARE_PROVIDER_SITE_OTHER): Payer: Medicare Other | Admitting: Family Medicine

## 2018-11-30 ENCOUNTER — Other Ambulatory Visit: Payer: Self-pay

## 2018-11-30 ENCOUNTER — Encounter: Payer: Self-pay | Admitting: Family Medicine

## 2018-11-30 DIAGNOSIS — K922 Gastrointestinal hemorrhage, unspecified: Secondary | ICD-10-CM

## 2018-11-30 DIAGNOSIS — K219 Gastro-esophageal reflux disease without esophagitis: Secondary | ICD-10-CM

## 2018-11-30 DIAGNOSIS — R413 Other amnesia: Secondary | ICD-10-CM

## 2018-11-30 DIAGNOSIS — K254 Chronic or unspecified gastric ulcer with hemorrhage: Secondary | ICD-10-CM

## 2018-11-30 NOTE — Progress Notes (Signed)
 Virtual Visit via telephone Note  I connected with Ameir G Gleed on 11/30/18 at 1550 by telephone and verified that I am speaking with the correct person using two identifiers. Mekiah G Tierce is currently located at home and wife are currently with her during visit. The provider,  A , MD is located in their office at time of visit.  Call ended at 1605  I discussed the limitations, risks, security and privacy concerns of performing an evaluation and management service by telephone and the availability of in person appointments. I also discussed with the patient that there may be a patient responsible charge related to this service. The patient expressed understanding and agreed to proceed.   History and Present Illness: Patient was in the hospital for another GI bleed and had EGD and found a hiatal hernia and he was having food get stuck and comes back up.  He still has food coming back up despite having his esophagus stretched.  He sees Dr Outlaw normally. He was in the hospital on 11/19/2018 to 11/21/2018.  He was having dark stools and felt weak before and is better in strength since. He was taking ibuprofen every night. His energy is coming back and he has not had any dark stools since leaving.  No diagnosis found.  Outpatient Encounter Medications as of 11/30/2018  Medication Sig  . acetaminophen (TYLENOL) 500 MG tablet Take 1 tablet (500 mg total) by mouth every 6 (six) hours as needed. (Patient not taking: Reported on 11/19/2018)  . amLODipine (NORVASC) 2.5 MG tablet Take 1 tablet (2.5 mg total) by mouth daily.  . Cobalamin Combinations (B-12) 100-5000 MCG SUBL Place 1 tablet under the tongue daily.  . gabapentin (NEURONTIN) 100 MG capsule Take 1 capsule (100 mg total) by mouth 3 (three) times daily. (Patient taking differently: Take 100 mg by mouth daily as needed (for pain in legs). *May take one capsule 3 times daily as prescribed)  . nystatin ointment (MYCOSTATIN)  Apply 1 application topically 2 (two) times daily.  . ondansetron (ZOFRAN) 4 MG tablet Take 1 tablet (4 mg total) by mouth every 6 (six) hours as needed for nausea.  . pantoprazole (PROTONIX) 40 MG tablet Take 1 tablet (40 mg total) by mouth 2 (two) times daily before a meal.  . potassium chloride SA (K-DUR) 20 MEQ tablet Take 1 tablet (20 mEq total) by mouth 2 (two) times daily. (Patient not taking: Reported on 11/19/2018)  . sildenafil (REVATIO) 20 MG tablet Take 1-3 tablets (20-60 mg total) by mouth as needed.  . tetrahydrozoline 0.05 % ophthalmic solution Place 1 drop into both eyes every other day.   . triamcinolone cream (KENALOG) 0.1 % Apply 1 application topically 2 (two) times daily. (Patient not taking: Reported on 11/19/2018)   No facility-administered encounter medications on file as of 11/30/2018.     Review of Systems  Constitutional: Negative for chills and fever.  Eyes: Negative for visual disturbance.  Respiratory: Negative for shortness of breath and wheezing.   Cardiovascular: Negative for chest pain and leg swelling.  Gastrointestinal: Positive for nausea. Negative for anal bleeding, blood in stool, constipation, diarrhea and vomiting.  Genitourinary: Negative for hematuria.  Musculoskeletal: Negative for back pain and gait problem.  Skin: Negative for rash.  All other systems reviewed and are negative.   Observations/Objective: Patient sounds comfortable and in no acute distress  Assessment and Plan: Problem List Items Addressed This Visit      Digestive   GERD (gastroesophageal reflux   disease)   Relevant Orders   CBC with Differential/Platelet   CMP14+EGFR   Upper GI bleed   GI bleed - Primary   Relevant Orders   CBC with Differential/Platelet   CMP14+EGFR       Follow Up Instructions: Follow up in 3 months to recheck gerd  Has f/u with GI   I discussed the assessment and treatment plan with the patient. The patient was provided an opportunity to ask  questions and all were answered. The patient agreed with the plan and demonstrated an understanding of the instructions.   The patient was advised to call back or seek an in-person evaluation if the symptoms worsen or if the condition fails to improve as anticipated.  The above assessment and management plan was discussed with the patient. The patient verbalized understanding of and has agreed to the management plan. Patient is aware to call the clinic if symptoms persist or worsen. Patient is aware when to return to the clinic for a follow-up visit. Patient educated on when it is appropriate to go to the emergency department.    I provided 15 minutes of non-face-to-face time during this encounter.     A , MD    

## 2018-11-30 NOTE — Addendum Note (Signed)
Addended by: Earlene Plater on: 11/30/2018 04:20 PM   Modules accepted: Orders

## 2018-12-01 ENCOUNTER — Ambulatory Visit (HOSPITAL_COMMUNITY)
Admission: RE | Admit: 2018-12-01 | Discharge: 2018-12-01 | Disposition: A | Discharge status: Home / Self Care | Payer: Medicare Other | Source: Ambulatory Visit | Attending: Family Medicine | Admitting: Family Medicine

## 2018-12-01 ENCOUNTER — Encounter: Payer: Self-pay | Admitting: Family Medicine

## 2018-12-01 ENCOUNTER — Ambulatory Visit (INDEPENDENT_AMBULATORY_CARE_PROVIDER_SITE_OTHER): Payer: Medicare Other | Admitting: Family Medicine

## 2018-12-01 DIAGNOSIS — M542 Cervicalgia: Secondary | ICD-10-CM

## 2018-12-01 DIAGNOSIS — R1011 Right upper quadrant pain: Secondary | ICD-10-CM | POA: Insufficient documentation

## 2018-12-01 LAB — CMP14+EGFR
ALT: 6 IU/L (ref 0–44)
AST: 8 IU/L (ref 0–40)
Albumin/Globulin Ratio: 1.3 (ref 1.2–2.2)
Albumin: 3.6 g/dL — ABNORMAL LOW (ref 3.8–4.8)
Alkaline Phosphatase: 93 IU/L (ref 39–117)
BUN/Creatinine Ratio: 19 (ref 10–24)
BUN: 18 mg/dL (ref 8–27)
Bilirubin Total: 0.2 mg/dL (ref 0.0–1.2)
CO2: 24 mmol/L (ref 20–29)
Calcium: 8.5 mg/dL — ABNORMAL LOW (ref 8.6–10.2)
Chloride: 103 mmol/L (ref 96–106)
Creatinine, Ser: 0.96 mg/dL (ref 0.76–1.27)
GFR calc Af Amer: 93 mL/min/{1.73_m2} (ref >59)
GFR calc non Af Amer: 80 mL/min/{1.73_m2} (ref >59)
Globulin, Total: 2.7 g/dL (ref 1.5–4.5)
Glucose: 97 mg/dL (ref 65–99)
Potassium: 3.8 mmol/L (ref 3.5–5.2)
Sodium: 140 mmol/L (ref 134–144)
Total Protein: 6.3 g/dL (ref 6.0–8.5)

## 2018-12-01 LAB — CBC WITH DIFFERENTIAL/PLATELET
Basophils Absolute: 0.1 10*3/uL (ref 0.0–0.2)
Basos: 1 %
EOS (ABSOLUTE): 0.1 10*3/uL (ref 0.0–0.4)
Eos: 1 %
Hematocrit: 28.7 % — ABNORMAL LOW (ref 37.5–51.0)
Hemoglobin: 9.1 g/dL — ABNORMAL LOW (ref 13.0–17.7)
Immature Grans (Abs): 0 10*3/uL (ref 0.0–0.1)
Immature Granulocytes: 0 %
Lymphocytes Absolute: 1.5 10*3/uL (ref 0.7–3.1)
Lymphs: 25 %
MCH: 26.1 pg — ABNORMAL LOW (ref 26.6–33.0)
MCHC: 31.7 g/dL (ref 31.5–35.7)
MCV: 83 fL (ref 79–97)
Monocytes Absolute: 0.7 10*3/uL (ref 0.1–0.9)
Monocytes: 11 %
Neutrophils Absolute: 3.8 10*3/uL (ref 1.4–7.0)
Neutrophils: 62 %
Platelets: 394 10*3/uL (ref 150–450)
RBC: 3.48 x10E6/uL — ABNORMAL LOW (ref 4.14–5.80)
RDW: 14.2 % (ref 11.6–15.4)
WBC: 6.2 10*3/uL (ref 3.4–10.8)

## 2018-12-01 MED ORDER — TIZANIDINE HCL 2 MG PO TABS: 2.0000 mg | ORAL_TABLET | Freq: Three times a day (TID) | ORAL | 0 refills | Status: DC | PRN

## 2018-12-01 NOTE — Progress Notes (Signed)
Virtual Visit via Telephone Note  I connected with Todd Berg on 12/01/18 at 4:29 PM by telephone and verified that I am speaking with the correct person using two identifiers. Todd Berg is currently located at home and his wife is currently with him during this visit. The provider, [name], FNP is located in their office at time of visit.  I discussed the limitations, risks, security and privacy concerns of performing an evaluation and management service by telephone and the availability of in person appointments. I also discussed with the patient that there may be a patient responsible charge related to this service. The patient expressed understanding and agreed to proceed.  Subjective: PCP: Dettinger, [name], MD  Chief Complaint  Patient presents with  . Neck Pain   Neck Pain: Paitent complains of neck pain. Event that precipitate these symptoms: none known. Onset of symptoms 3 days ago, unchanged since that time. Current symptoms are pain on right side of neck towards ear. Patient denies numbness, paresthesias, stiffness and weakness. Patient denies previously experiencing this type of pain.  At home treatment includes Tylenol 1000 mg once yesterday and a heating pad once today.  I did have wife feel around patient's neck to try to pinpoint location of pain and based on her report this definitely seems muscular.   ROS: Per HPI  Current Outpatient Medications:  .  acetaminophen (TYLENOL) 500 MG tablet, Take 1 tablet (500 mg total) by mouth every 6 (six) hours as needed. (Patient not taking: Reported on 11/19/2018), Disp: 30 tablet, Rfl: 0 .  amLODipine (NORVASC) 2.5 MG tablet, Take 1 tablet (2.5 mg total) by mouth daily., Disp: 90 tablet, Rfl: 3 .  Cobalamin Combinations (B-12) [phone] MCG SUBL, Place 1 tablet under the tongue daily., Disp: 30 tablet, Rfl: 6 .  gabapentin (NEURONTIN) 100 MG capsule, Take 1 capsule (100 mg total) by mouth 3 (three) times daily.  (Patient taking differently: Take 100 mg by mouth daily as needed (for pain in legs). *May take one capsule 3 times daily as prescribed), Disp: 90 capsule, Rfl: 3 .  nystatin ointment (MYCOSTATIN), Apply 1 application topically 2 (two) times daily., Disp: 30 g, Rfl: 0 .  ondansetron (ZOFRAN) 4 MG tablet, Take 1 tablet (4 mg total) by mouth every 6 (six) hours as needed for nausea., Disp: 20 tablet, Rfl: 0 .  pantoprazole (PROTONIX) 40 MG tablet, Take 1 tablet (40 mg total) by mouth 2 (two) times daily before a meal., Disp: 60 tablet, Rfl: 3 .  potassium chloride SA (K-DUR) 20 MEQ tablet, Take 1 tablet (20 mEq total) by mouth 2 (two) times daily. (Patient not taking: Reported on 11/19/2018), Disp: 10 tablet, Rfl: 0 .  sildenafil (REVATIO) 20 MG tablet, Take 1-3 tablets (20-60 mg total) by mouth as needed., Disp: 20 tablet, Rfl: 1 .  tetrahydrozoline 0.05 % ophthalmic solution, Place 1 drop into both eyes every other day. , Disp: , Rfl:  .  triamcinolone cream (KENALOG) 0.1 %, Apply 1 application topically 2 (two) times daily. (Patient not taking: Reported on 11/19/2018), Disp: 30 g, Rfl: 0  No Known Allergies Past Medical History:  Diagnosis Date  . Cancer ([place]) 2015   rectal  . Chronic back pain   . COPD (chronic obstructive pulmonary disease) ([place])   . GERD (gastroesophageal reflux disease)   . Hematemesis   . Hemorrhoids   . Hyperlipidemia   . Hypotension   . Melena   . Melena 10/12/2017  .  Stroke [facility]) 2019    Observations/Objective: A&O  No respiratory distress or wheezing audible over the phone Mood, judgement, and thought processes all WNL  Assessment and Plan: 1. Neck pain, musculoskeletal - Encouraged to use heating pad 3-4 times a day.  Tylenol 1000 mg 3 times a day as needed.  Biofreeze 3-4 times a day as needed. - tiZANidine (ZANAFLEX) 2 MG tablet; Take 1 tablet (2 mg total) by mouth every 8 (eight) hours as needed for muscle spasms.  Dispense: 30 tablet; Refill: 0    Follow Up Instructions:  I discussed the assessment and treatment plan with the patient. The patient was provided an opportunity to ask questions and all were answered. The patient agreed with the plan and demonstrated an understanding of the instructions.   The patient was advised to call back or seek an in-person evaluation if the symptoms worsen or if the condition fails to improve as anticipated.  The above assessment and management plan was discussed with the patient. The patient verbalized understanding of and has agreed to the management plan. Patient is aware to call the clinic if symptoms persist or worsen. Patient is aware when to return to the clinic for a follow-up visit. Patient educated on when it is appropriate to go to the emergency department.   Time call ended: 4:36 PM  I provided 9 minutes of non-face-to-face time during this encounter.  Hendricks Limes, MSN, APRN, FNP-C [facility] 12/01/18

## 2018-12-04 ENCOUNTER — Telehealth: Payer: Self-pay | Admitting: Family Medicine

## 2018-12-04 ENCOUNTER — Other Ambulatory Visit: Payer: Medicare Other

## 2018-12-04 ENCOUNTER — Other Ambulatory Visit: Payer: Self-pay

## 2018-12-04 DIAGNOSIS — D649 Anemia, unspecified: Secondary | ICD-10-CM | POA: Diagnosis not present

## 2018-12-04 DIAGNOSIS — K8000 Calculus of gallbladder with acute cholecystitis without obstruction: Secondary | ICD-10-CM

## 2018-12-04 DIAGNOSIS — K824 Cholesterolosis of gallbladder: Secondary | ICD-10-CM

## 2018-12-04 NOTE — Telephone Encounter (Signed)
Placed referral for the patient. 

## 2018-12-04 NOTE — Telephone Encounter (Signed)
-----   Message from Zannie Cove, LPN sent at [id number]  5:02 PM EST ----- Pt aware - they would like to be referred to [facility] for general surgery. Please put in referral

## 2018-12-05 LAB — CBC WITH DIFFERENTIAL/PLATELET
Basophils Absolute: 0.1 10*3/uL (ref 0.0–0.2)
Basos: 1 %
EOS (ABSOLUTE): 0.1 10*3/uL (ref 0.0–0.4)
Eos: 2 %
Hematocrit: 31.5 % — ABNORMAL LOW (ref 37.5–51.0)
Hemoglobin: 9.8 g/dL — ABNORMAL LOW (ref 13.0–17.7)
Immature Grans (Abs): 0 10*3/uL (ref 0.0–0.1)
Immature Granulocytes: 0 %
Lymphocytes Absolute: 1.4 10*3/uL (ref 0.7–3.1)
Lymphs: 21 %
MCH: 25.7 pg — ABNORMAL LOW (ref 26.6–33.0)
MCHC: 31.1 g/dL — ABNORMAL LOW (ref 31.5–35.7)
MCV: 83 fL (ref 79–97)
Monocytes Absolute: 0.5 10*3/uL (ref 0.1–0.9)
Monocytes: 8 %
Neutrophils Absolute: 4.5 10*3/uL (ref 1.4–7.0)
Neutrophils: 68 %
Platelets: 409 10*3/uL (ref 150–450)
RBC: 3.82 x10E6/uL — ABNORMAL LOW (ref 4.14–5.80)
RDW: 14.1 % (ref 11.6–15.4)
WBC: 6.6 10*3/uL (ref 3.4–10.8)

## 2018-12-06 ENCOUNTER — Emergency Department (HOSPITAL_COMMUNITY)
Admission: EM | Admit: 2018-12-06 | Discharge: 2018-12-06 | Disposition: A | Discharge status: Home / Self Care | Payer: Medicare Other | Attending: Emergency Medicine | Admitting: Emergency Medicine

## 2018-12-06 ENCOUNTER — Encounter: Payer: Self-pay | Admitting: Physician Assistant

## 2018-12-06 ENCOUNTER — Ambulatory Visit (INDEPENDENT_AMBULATORY_CARE_PROVIDER_SITE_OTHER): Payer: Medicare Other | Admitting: Physician Assistant

## 2018-12-06 ENCOUNTER — Emergency Department (HOSPITAL_COMMUNITY): Payer: Medicare Other

## 2018-12-06 ENCOUNTER — Other Ambulatory Visit: Payer: Self-pay

## 2018-12-06 ENCOUNTER — Encounter (HOSPITAL_COMMUNITY): Payer: Self-pay

## 2018-12-06 DIAGNOSIS — N2889 Other specified disorders of kidney and ureter: Secondary | ICD-10-CM | POA: Diagnosis not present

## 2018-12-06 DIAGNOSIS — Z87891 Personal history of nicotine dependence: Secondary | ICD-10-CM | POA: Insufficient documentation

## 2018-12-06 DIAGNOSIS — J449 Chronic obstructive pulmonary disease, unspecified: Secondary | ICD-10-CM | POA: Diagnosis not present

## 2018-12-06 DIAGNOSIS — Z79899 Other long term (current) drug therapy: Secondary | ICD-10-CM | POA: Diagnosis not present

## 2018-12-06 DIAGNOSIS — I10 Essential (primary) hypertension: Secondary | ICD-10-CM | POA: Diagnosis not present

## 2018-12-06 DIAGNOSIS — R109 Unspecified abdominal pain: Secondary | ICD-10-CM | POA: Insufficient documentation

## 2018-12-06 DIAGNOSIS — N50812 Left testicular pain: Secondary | ICD-10-CM

## 2018-12-06 DIAGNOSIS — N5089 Other specified disorders of the male genital organs: Secondary | ICD-10-CM

## 2018-12-06 LAB — URINALYSIS, ROUTINE W REFLEX MICROSCOPIC
Bilirubin Urine: NEGATIVE
Glucose, UA: NEGATIVE mg/dL
Hgb urine dipstick: NEGATIVE
Ketones, ur: NEGATIVE mg/dL
Leukocytes,Ua: NEGATIVE
Nitrite: NEGATIVE
Protein, ur: NEGATIVE mg/dL
Specific Gravity, Urine: 1.005 (ref 1.005–1.030)
pH: 7 (ref 5.0–8.0)

## 2018-12-06 LAB — CBC WITH DIFFERENTIAL/PLATELET
Abs Immature Granulocytes: 0.04 10*3/uL (ref 0.00–0.07)
Basophils Absolute: 0.1 10*3/uL (ref 0.0–0.1)
Basophils Relative: 1 %
Eosinophils Absolute: 0.1 10*3/uL (ref 0.0–0.5)
Eosinophils Relative: 2 %
HCT: 32.3 % — ABNORMAL LOW (ref 39.0–52.0)
Hemoglobin: 9.9 g/dL — ABNORMAL LOW (ref 13.0–17.0)
Immature Granulocytes: 1 %
Lymphocytes Relative: 23 %
Lymphs Abs: 1.7 10*3/uL (ref 0.7–4.0)
MCH: 26.1 pg (ref 26.0–34.0)
MCHC: 30.7 g/dL (ref 30.0–36.0)
MCV: 85 fL (ref 80.0–100.0)
Monocytes Absolute: 0.6 10*3/uL (ref 0.1–1.0)
Monocytes Relative: 9 %
Neutro Abs: 4.8 10*3/uL (ref 1.7–7.7)
Neutrophils Relative %: 64 %
Platelets: 359 10*3/uL (ref 150–400)
RBC: 3.8 MIL/uL — ABNORMAL LOW (ref 4.22–5.81)
RDW: 13.6 % (ref 11.5–15.5)
WBC: 7.4 10*3/uL (ref 4.0–10.5)
nRBC: 0 % (ref 0.0–0.2)

## 2018-12-06 LAB — COMPREHENSIVE METABOLIC PANEL
ALT: 10 U/L (ref 0–44)
AST: 12 U/L — ABNORMAL LOW (ref 15–41)
Albumin: 3.7 g/dL (ref 3.5–5.0)
Alkaline Phosphatase: 76 U/L (ref 38–126)
Anion gap: 9 (ref 5–15)
BUN: 15 mg/dL (ref 8–23)
CO2: 26 mmol/L (ref 22–32)
Calcium: 9 mg/dL (ref 8.9–10.3)
Chloride: 104 mmol/L (ref 98–111)
Creatinine, Ser: 0.91 mg/dL (ref 0.61–1.24)
GFR calc Af Amer: >60 mL/min (ref >60)
GFR calc non Af Amer: >60 mL/min (ref >60)
Glucose, Bld: 88 mg/dL (ref 70–99)
Potassium: 3.3 mmol/L — ABNORMAL LOW (ref 3.5–5.1)
Sodium: 139 mmol/L (ref 135–145)
Total Bilirubin: 0.2 mg/dL — ABNORMAL LOW (ref 0.3–1.2)
Total Protein: 7.3 g/dL (ref 6.5–8.1)

## 2018-12-06 LAB — LACTIC ACID, PLASMA: Lactic Acid, Venous: 1.4 mmol/L (ref 0.5–1.9)

## 2018-12-06 MED ORDER — IOHEXOL 300 MG/ML  SOLN
100.0000 mL | Freq: Once | INTRAMUSCULAR | Status: AC | PRN
  Administered 2018-12-06: 15:00:00 100 mL via INTRAVENOUS

## 2018-12-06 MED ORDER — SODIUM CHLORIDE (PF) 0.9 % IJ SOLN
INTRAMUSCULAR | Status: AC
  Filled 2018-12-06: qty 50

## 2018-12-06 MED ORDER — SODIUM CHLORIDE 0.9 % IV BOLUS
500.0000 mL | Freq: Once | INTRAVENOUS | Status: AC
  Administered 2018-12-06: 13:00:00 500 mL via INTRAVENOUS

## 2018-12-06 NOTE — ED Triage Notes (Signed)
Pt has swelling and pain in his left testicle x2 days. Pt states that he is able to void. Pt has never had an issue like this before.

## 2018-12-06 NOTE — Discharge Instructions (Signed)
Follow up with PCP for reevaluation if pain returns

## 2018-12-06 NOTE — ED Provider Notes (Signed)
[facility] DEPT Provider Note   CSN: [id number] Arrival date & time: 12/06/18  1250   History   Chief Complaint Chief Complaint  Patient presents with   Testicle Pain   HPI Todd Berg is a 68 y.o. male with past medical history significant for chronic back pain, COPD, CVA who presents for evaluation of testicular swelling and pain. Patient states he has had left testicular pain x 2 days. Pain constant since onset. No prior hx abd surgeries or know hernias.  Fever, chills, nausea, vomiting, abdominal pain, penile discharge, rashes, lesions, diarrhea, constipation.  Patient states he is able to urinate without difficulty.  He has not taken anything for his pain.  He rates his current pain a 6/10.  Patient states he does not anything for his pain at this time.  Denies additional aggravating or alleviating factors.  Did a telehealth visit with his PCP this morning who told him to proceed to the emergency department to rule out torsion.  History obtained from patient and past medical records.  No interpreter is used.     HPI  Past Medical History:  Diagnosis Date   Cancer ([place]) 2015   rectal   Chronic back pain    COPD (chronic obstructive pulmonary disease) ([facility])    GERD (gastroesophageal reflux disease)    Hematemesis    Hemorrhoids    Hyperlipidemia    Hypotension    Melena    Melena 10/12/2017   Stroke ([place]) 2019    Patient Active Problem List   Diagnosis Date Noted   Memory changes 11/30/2018   H/O: CVA (cerebrovascular accident) 11/20/2018   GI bleed 11/20/2018   Upper GI bleed    Melena 11/19/2018   Infestation by bed bug 11/14/2018   Gallstones 11/14/2018   Vitamin B12 deficiency 08/03/2018   Numbness and tingling of both legs 08/03/2018   Anemia 10/10/2017   Fatigue 10/10/2017   Hypertension 03/21/2017   Generalized anxiety disorder 03/21/2017   Degenerative disc disease, lumbar 03/17/2017   Iron  deficiency anemia due to chronic blood loss 11/20/2016   Chronic back pain    COPD (chronic obstructive pulmonary disease) (HCC)    GERD (gastroesophageal reflux disease)    History of rectal cancer 09/13/2015    Past Surgical History:  Procedure Laterality Date   BACK SURGERY     BIOPSY  10/13/2017   Procedure: BIOPSY;  Surgeon: Laurence Spates, MD;  Location: [place];  Service: Endoscopy;;   BIOPSY  11/20/2018   Procedure: BIOPSY;  Surgeon: Daneil Dolin, MD;  [facility];  Service: Endoscopy;;   COLONOSCOPY  08/2015   Dr. [name]: normal   ESOPHAGEAL DILATION  11/20/2018   Procedure: ESOPHAGEAL DILATION;  Surgeon: Daneil Dolin, MD;  [facility];  Service: Endoscopy;;   ESOPHAGOGASTRODUODENOSCOPY N/A 06/09/2016   Dr. Wilford Corner: LA Grade A reflux esophagitis, non-bleeding gastric ulcers s/p epi. Large amount of old blood in dependent portions of stomach. H.pylori serology: negative.    ESOPHAGOGASTRODUODENOSCOPY (EGD) WITH PROPOFOL Left 11/21/2016   Dr. Paulita Fujita: small hiatal hernia, benign-appearing esophageal stenosis, non-bleeding gastric ulcer without stigmata of bleeding. 6 mm in largest dimension. Gastritis. Normal duodenum. Felt to be NSAID-induced.    ESOPHAGOGASTRODUODENOSCOPY (EGD) WITH PROPOFOL N/A 10/13/2017   Dr. Oletta Lamas: large hiatal hernia, one cratered gastric ulcer in preplyoric region of stomach, 8 mm in largest dimension s/p biopsy. Negative H.pylori   ESOPHAGOGASTRODUODENOSCOPY (EGD) WITH PROPOFOL N/A 11/20/2018   Procedure: ESOPHAGOGASTRODUODENOSCOPY (  EGD) WITH PROPOFOL;  Surgeon: Daneil Dolin, MD;  [facility];  Service: Endoscopy;  Laterality: N/A;   HEMORRHOID SURGERY     HERNIA REPAIR     RECTAL SURGERY        Home Medications    Prior to Admission medications   Medication Sig Start Date End Date Taking? Authorizing Provider  acetaminophen (TYLENOL) 500 MG tablet Take 1 tablet (500 mg total) by  mouth every 6 (six) hours as needed. Patient not taking: Reported on 11/19/2018 10/31/18   Rodell Perna A, PA-C  amLODipine (NORVASC) 2.5 MG tablet Take 1 tablet (2.5 mg total) by mouth daily. 05/04/18   Dettinger, [name], MD  Cobalamin Combinations (B-12) [phone] MCG SUBL Place 1 tablet under the tongue daily. 08/03/18   Baruch Gouty, FNP  gabapentin (NEURONTIN) 100 MG capsule Take 1 capsule (100 mg total) by mouth 3 (three) times daily. Patient taking differently: Take 100 mg by mouth daily as needed (for pain in legs). *May take one capsule 3 times daily as prescribed 09/14/18   Dettinger, [name], MD  nystatin ointment (MYCOSTATIN) Apply 1 application topically 2 (two) times daily. 09/22/18   Dettinger, [name], MD  ondansetron (ZOFRAN) 4 MG tablet Take 1 tablet (4 mg total) by mouth every 6 (six) hours as needed for nausea. 11/21/18   Roxan Hockey, MD  pantoprazole (PROTONIX) 40 MG tablet Take 1 tablet (40 mg total) by mouth 2 (two) times daily before a meal. 11/21/18   Emokpae, Courage, MD  potassium chloride SA (K-DUR) 20 MEQ tablet Take 1 tablet (20 mEq total) by mouth 2 (two) times daily. Patient not taking: Reported on [id number]   Delora Fuel, MD  sildenafil (REVATIO) 20 MG tablet Take 1-3 tablets (20-60 mg total) by mouth as needed. 09/13/18   Dettinger, [name], MD  tetrahydrozoline 0.05 % ophthalmic solution Place 1 drop into both eyes every other day.     [provider]  tiZANidine (ZANAFLEX) 2 MG tablet Take 1 tablet (2 mg total) by mouth every 8 (eight) hours as needed for muscle spasms. 12/01/18   [name], FNP  triamcinolone cream (KENALOG) 0.1 % Apply 1 application topically 2 (two) times daily. Patient not taking: Reported on 11/19/2018 09/22/18   Dettinger, [name], MD    Family History Family History  Problem Relation Age of Onset   COPD Mother    Obesity Mother    Stroke Father     Social History Social History   Tobacco Use    Smoking status: Former Smoker    Start date: 12/30/1964    Quit date: 06/19/2009    Years since quitting: 9.4   Smokeless tobacco: Former Systems developer    Types: Snuff    Quit date: 09/26/2005  Substance Use Topics   Alcohol use: No   Drug use: No    Allergies   Patient has no known allergies.  Review of Systems Review of Systems  Constitutional: Negative.   HENT: Negative.   Respiratory: Negative.   Cardiovascular: Negative.   Gastrointestinal: Negative.   Genitourinary: Positive for scrotal swelling and testicular pain. Negative for decreased urine volume, difficulty urinating, discharge, dysuria, frequency, genital sores, hematuria, penile pain, penile swelling and urgency.  Musculoskeletal: Negative.   Skin: Negative.   Neurological: Negative.   All other systems reviewed and are negative.   Physical Exam Updated Vital Signs BP (!) 146/63 (BP Location: Left Arm)    Pulse 60  Temp 98.3 F (36.8 C) (Oral)    Resp 18    SpO2 100%   Physical Exam Vitals signs and nursing note reviewed. Exam conducted with a chaperone present.  Constitutional:      General: He is not in acute distress.    Appearance: He is well-developed. He is not ill-appearing, toxic-appearing or diaphoretic.  HENT:     Head: Normocephalic and atraumatic.     Nose: Nose normal. No congestion.     Mouth/Throat:     Mouth: Mucous membranes are moist.     Pharynx: Oropharynx is clear.  Eyes:     Pupils: Pupils are equal, round, and reactive to light.  Neck:     Musculoskeletal: Normal range of motion and neck supple.  Cardiovascular:     Rate and Rhythm: Normal rate and regular rhythm.     Pulses: Normal pulses.     Heart sounds: Normal heart sounds.  Pulmonary:     Effort: Pulmonary effort is normal. No respiratory distress.     Breath sounds: Normal breath sounds.  Abdominal:     General: Bowel sounds are normal. There is no distension.     Palpations: Abdomen is soft.     Tenderness: There is no  abdominal tenderness. There is no right CVA tenderness, left CVA tenderness, guarding or rebound.     Hernia: No hernia is present.     Comments: Soft, nontender without rebound or guarding.  No evidence of gross hernias.  He does have tenderness to his left testes and unable to perform for left inguinal hernia secondary to pain.  Genitourinary:    Penis: Normal.      Scrotum/Testes: Cremasteric reflex is present.        Left: Tenderness and swelling present. Mass not present.     Epididymis:     Right: Normal.     Left: Normal.     Comments: Minimal swelling to left testes.  Cremasteric reflex present. No phimosis or paraphimosis. Nontender to palpation. [place] Garrison RN as chaparone. Musculoskeletal: Normal range of motion.     Comments: Moves all 4 extremities without difficulty  Skin:    General: Skin is warm and dry.     Comments: Brisk cap refill. No rashes or lesions.  Neurological:     Mental Status: He is alert.     Comments: Ambulatory without difficulty.    ED Treatments / Results  Labs (all labs ordered are listed, but only abnormal results are displayed) Labs Reviewed  CBC WITH DIFFERENTIAL/PLATELET - Abnormal; Notable for the following components:      Result Value   RBC 3.80 (*)    Hemoglobin 9.9 (*)    HCT 32.3 (*)    All other components within normal limits  COMPREHENSIVE METABOLIC PANEL - Abnormal; Notable for the following components:   Potassium 3.3 (*)    AST 12 (*)    Total Bilirubin 0.2 (*)    All other components within normal limits  URINALYSIS, ROUTINE W REFLEX MICROSCOPIC - Abnormal; Notable for the following components:   Color, Urine STRAW (*)    All other components within normal limits  CULTURE, BLOOD (ROUTINE X 2)  CULTURE, BLOOD (ROUTINE X 2)  URINE CULTURE  LACTIC ACID, PLASMA  LACTIC ACID, PLASMA    EKG None  Radiology Ct Abdomen Pelvis W Contrast  Result Date: 12/06/2018 CLINICAL DATA:  Left testicular pain/swelling EXAM: CT  ABDOMEN AND PELVIS WITH CONTRAST TECHNIQUE: Multidetector CT imaging of the abdomen  and pelvis was performed using the standard protocol following bolus administration of intravenous contrast. CONTRAST:  146mL OMNIPAQUE IOHEXOL 300 MG/ML  SOLN COMPARISON:  10/31/2018 FINDINGS: Lower chest: Lung bases are essentially clear. Hepatobiliary: Liver is notable for an 8 mm cyst in segment 4B. Gallbladder is underdistended. No intrahepatic or extrahepatic ductal dilatation. Pancreas: Within normal limits. Spleen: Within normal limits. Adrenals/Urinary Tract: Adrenal glands are within normal limits. 7 mm hyperdense lesion in the lateral right upper kidney, unchanged. Left renal cysts measuring up to 12 mm in the lateral left lower pole. No hydronephrosis. Bladder is within normal limits. Stomach/Bowel: Stomach is notable for a small hiatal hernia. No evidence of bowel obstruction. Normal appendix (series 3/image 70). Vascular/Lymphatic: No evidence of abdominal aortic aneurysm. No suspicious abdominopelvic lymphadenopathy. Reproductive: Prostate is grossly unremarkable, noting dystrophic calcifications. Other: No abdominopelvic ascites. Suspected prior left inguinal hernia repair. No evidence of inguinal hernia or findings to account for the patient's left groin/scrotal pain. Musculoskeletal: Mild degenerative changes of the visualized thoracolumbar spine. Postsurgical changes at L5-S1. IMPRESSION: No CT findings to account for the patient's left groin/scrotal pain. 7 mm hyperdense lesion in the lateral right upper kidney, unchanged. Small solid renal neoplasm remains possible. Given the small size, consider follow-up MRI abdomen with/without contrast in 6-12 months for further characterization. Additional stable ancillary findings as above. Electronically Signed   By: Julian Hy M.D.   On: 12/06/2018 15:59   US Scrotum W/doppler  Result Date: 12/06/2018 CLINICAL DATA:  68 year old with left testicular pain and  swelling. EXAM: SCROTAL ULTRASOUND DOPPLER ULTRASOUND OF THE TESTICLES TECHNIQUE: Complete ultrasound examination of the testicles, epididymis, and other scrotal structures was performed. Color and spectral Doppler ultrasound were also utilized to evaluate blood flow to the testicles. COMPARISON:  Scrotal ultrasound 05/17/2011 FINDINGS: Right testicle Measurements: 4.0 x 1.6 x 3.1 cm. Homogeneous echogenicity. Normal blood flow. No mass or microlithiasis visualized. Left testicle Measurements: 3.1 x 2.4 x 3.4 cm. Homogeneous echogenicity. Normal blood flow. No mass or microlithiasis visualized. Right epididymis:  Normal in size and appearance. Left epididymis:  Normal in size and appearance. Hydrocele: Small and slightly heterogeneous on the right. No left hydrocele. Varicocele: None visualized. Previous left varicocele not demonstrated. Pulsed Doppler interrogation of both testes demonstrates normal low resistance arterial and venous waveforms bilaterally. IMPRESSION: 1. Small minimally complex right hydrocele. 2. Normal sonographic appearance of the testes and epididymi with normal blood flow. Electronically Signed   By: Keith Rake M.D.   On: 12/06/2018 14:19    Procedures Procedures (including critical care time)  Medications Ordered in ED Medications  sodium chloride (PF) 0.9 % injection (has no administration in time range)  sodium chloride 0.9 % bolus 500 mL (0 mLs Intravenous Stopped 12/06/18 1453)  iohexol (OMNIPAQUE) 300 MG/ML solution 100 mL (100 mLs Intravenous Contrast Given 12/06/18 1529)    Initial Impression / Assessment and Plan / ED Course  I have reviewed the triage vital signs and the nursing notes.  Pertinent labs & imaging results that were available during my care of the patient were reviewed by me and considered in my medical decision making (see chart for details).  68 year old male appears otherwise well presents for evaluation of left-sided testicular pain and  swelling x2 days.  He is afebrile, nonseptic, non-ill-appearing.  No systemic symptoms.  He has been able to urinate without difficulty.  Abdomen soft, nontender without rebound or guarding.  He does have some minimal swelling however no acute tenderness ot evidence of infectious  process.  No palpable inguinal hernia secondary to pain however patient does not want anything for pain at this time.  Sent by ECP to rule out torsion.  DDx testicular torsion, epididymitis, Fournier's gangrene, incarcerated inguinal hernia, radiation of pain from renal stone, infectious process.  Will obtain labs, ultrasound and reassess.  Labs personally reviewed: CBC without leukocytosis, Hgb at baseline Lactic acid 1.4 Metabolic panel with mild hypokalemia at 3.3 Urinalysis negative for infection Urine culture pending Blood cultures pending  [place] with mild right hydrocele CT [facility] without acute findings. He does have hyperdense lesion to right kidney which patient previously aware of. Discussed recommended MR follow up.  1700: Patient reevaluation. No complaints. No pain at this time. No evidence of infectious process at this time. Unsure of cause of patients pain however low suspicion for acute infectious process, stone, mass, torsion, UTI, prostatis. Incarcerated or strangulated hernia, acute vascular compromise at this time. Will dc patient home and have him follow up outpatient.   Patient is nontoxic, nonseptic appearing, in no apparent distress.  Patient's pain and other symptoms adequately managed in emergency department.  Fluid bolus given.  Labs, imaging and vitals reviewed.  Patient does not meet the SIRS or Sepsis criteria.  On repeat exam patient does not have a surgical abdomin and there are no peritoneal signs.  No indication of appendicitis, bowel obstruction, bowel perforation, cholecystitis, diverticulitis.  Patient discharged home with symptomatic treatment and given strict instructions for follow-up with their  primary care physician.  I have also discussed reasons to return immediately to the ER.  Patient expresses understanding and agrees with plan.    Patient discussed and seen by attending Dr. Roslynn Amble who is in agreement with above treatment, plan and disposition.       Final Clinical Impressions(s) / ED Diagnoses   Final diagnoses:  Pain in left testicle    ED Discharge Orders    None       Quinnton Bury A, PA-C 12/06/18 1727    Lucrezia Starch, MD 12/08/18 [phone]

## 2018-12-06 NOTE — Progress Notes (Signed)
Telephone visit  Subjective: [id number] pain PCP: Dettinger, [name], MD [id number] Todd Berg is a 68 y.o. male calls for telephone consult today. Patient provides verbal consent for consult held via phone.  Patient is identified with 2 separate identifiers.  At this time the entire area is on COVID-19 social distancing and stay home orders are in place.  Patient is of higher risk and therefore we are performing this by a virtual method.  Location of patient: home Location of provider: HOME Others present for call: wife  For the past 2 days patient has had increasing pain in his left testicle with swelling.  He states it is just too painful to touch.  He denies seeing any redness or on the skin or breaks in the skin.  He has never had this problem before.  He denies any.  While I was talking to him on the phone he was in so much pain he had let his wife take over the conversation.  Had a discussion with him about going to [facility] in [place] because it is a urology hospital so that test could be run in case there is something like a torsion or infection.  They agree and will go to the hospital.   ROS: Per HPI  No Known Allergies Past Medical History:  Diagnosis Date  . Cancer ([place]) 2015   rectal  . Chronic back pain   . COPD (chronic obstructive pulmonary disease) ([place])   . GERD (gastroesophageal reflux disease)   . Hematemesis   . Hemorrhoids   . Hyperlipidemia   . Hypotension   . Melena   . Melena 10/12/2017  . Stroke [facility]) 2019    Current Outpatient Medications:  .  acetaminophen (TYLENOL) 500 MG tablet, Take 1 tablet (500 mg total) by mouth every 6 (six) hours as needed. (Patient not taking: Reported on 11/19/2018), Disp: 30 tablet, Rfl: 0 .  amLODipine (NORVASC) 2.5 MG tablet, Take 1 tablet (2.5 mg total) by mouth daily., Disp: 90 tablet, Rfl: 3 .  Cobalamin Combinations (B-12) [phone] MCG SUBL, Place 1 tablet under the tongue daily., Disp: 30  tablet, Rfl: 6 .  gabapentin (NEURONTIN) 100 MG capsule, Take 1 capsule (100 mg total) by mouth 3 (three) times daily. (Patient taking differently: Take 100 mg by mouth daily as needed (for pain in legs). *May take one capsule 3 times daily as prescribed), Disp: 90 capsule, Rfl: 3 .  nystatin ointment (MYCOSTATIN), Apply 1 application topically 2 (two) times daily., Disp: 30 g, Rfl: 0 .  ondansetron (ZOFRAN) 4 MG tablet, Take 1 tablet (4 mg total) by mouth every 6 (six) hours as needed for nausea., Disp: 20 tablet, Rfl: 0 .  pantoprazole (PROTONIX) 40 MG tablet, Take 1 tablet (40 mg total) by mouth 2 (two) times daily before a meal., Disp: 60 tablet, Rfl: 3 .  potassium chloride SA (K-DUR) 20 MEQ tablet, Take 1 tablet (20 mEq total) by mouth 2 (two) times daily. (Patient not taking: Reported on 11/19/2018), Disp: 10 tablet, Rfl: 0 .  sildenafil (REVATIO) 20 MG tablet, Take 1-3 tablets (20-60 mg total) by mouth as needed., Disp: 20 tablet, Rfl: 1 .  tetrahydrozoline 0.05 % ophthalmic solution, Place 1 drop into both eyes every other day. , Disp: , Rfl:  .  tiZANidine (ZANAFLEX) 2 MG tablet, Take 1 tablet (2 mg total) by mouth every 8 (eight) hours as needed for muscle spasms., Disp: 30 tablet, Rfl: 0 .  triamcinolone cream (KENALOG) 0.1 %, Apply 1 application topically 2 (two) times daily. (Patient not taking: Reported on 11/19/2018), Disp: 30 g, Rfl: 0  Assessment/ Plan: 68 y.o. male   1. Swelling of left testicle Go to [facility] emergency department  2. Pain in left testicle Go to [facility] long emergency department   No follow-ups on file.  Continue all other maintenance medications as listed above.  Start time: 10:39 AM End time: 10:48 AM  No orders of the defined types were placed in this encounter.   Particia Nearing PA-C [place] [phone]

## 2018-12-07 LAB — URINE CULTURE: Culture: <10000 — AB

## 2018-12-11 ENCOUNTER — Telehealth: Payer: Self-pay | Admitting: *Deleted

## 2018-12-11 DIAGNOSIS — K8000 Calculus of gallbladder with acute cholecystitis without obstruction: Secondary | ICD-10-CM

## 2018-12-11 LAB — CULTURE, BLOOD (ROUTINE X 2)
Culture: NO GROWTH
Special Requests: ADEQUATE

## 2018-12-11 NOTE — Telephone Encounter (Signed)
Patients wife says he doesn't want to see Dr Constance Haw again. He would like a referral to someone in [place]

## 2018-12-11 NOTE — Telephone Encounter (Signed)
Patient saw Dr. Constance Haw prior to getting the right upper quadrant abdominal ultrasound and she wanted to see him back after, now that he has had the ultrasound he can just give Dr. Blake Divine office a call, he does not need a new referral

## 2018-12-11 NOTE — Progress Notes (Signed)
It was a duplicate Ref - he had an appt at [facility] in [place] - but they had to close the clinic down because he came in with a bed bug infestation - so if they made him reschedule his appt he needs to call them and make a new appt.

## 2018-12-11 NOTE — Telephone Encounter (Signed)
Per his wife,  patient has not been seen by a surgeon but needs to have an appointment scheduled for gall bladder surgery.  Does provider need to do a new referral?

## 2018-12-13 NOTE — Telephone Encounter (Signed)
Have placed a new referral for the patient because he wants to go to somebody besides Dr. Blake Divine Caryl Pina, MD [place] 12/13/2018, 3:53 PM

## 2018-12-13 NOTE — Addendum Note (Signed)
Addended by: Caryl Pina on: 12/13/2018 03:53 PM   Modules accepted: Orders

## 2018-12-14 NOTE — Progress Notes (Signed)
Different Ref placed - Will send Patient to [facility] in [place] since he would like to stay in [place].

## 2018-12-14 NOTE — Telephone Encounter (Signed)
Okay sounds great

## 2018-12-14 NOTE — Telephone Encounter (Signed)
Will send new Ref to [facility] in [place] since Patient would like to stay in [facility].

## 2018-12-22 ENCOUNTER — Telehealth: Payer: Self-pay | Admitting: Family Medicine

## 2018-12-22 NOTE — Telephone Encounter (Signed)
Patient reports he has been itching all over for about two months now.  He has tried anti-itch creams and they do not seem to help.  He has not noticed any rash or lesions on his body, no shortness of breath.  Appointment made for patient with Monia Pouch on 12/25/18 at 8:05 am.

## 2018-12-25 ENCOUNTER — Other Ambulatory Visit: Payer: Self-pay

## 2018-12-25 ENCOUNTER — Telehealth: Payer: Self-pay | Admitting: Family Medicine

## 2018-12-25 ENCOUNTER — Encounter: Payer: Self-pay | Admitting: Family Medicine

## 2018-12-25 ENCOUNTER — Ambulatory Visit (INDEPENDENT_AMBULATORY_CARE_PROVIDER_SITE_OTHER): Payer: Medicare Other | Admitting: Family Medicine

## 2018-12-25 VITALS — BP 114/65 | HR 59 | Temp 98.0°F | Resp 20 | Ht 72.0 in | Wt 204.0 lb

## 2018-12-25 DIAGNOSIS — K219 Gastro-esophageal reflux disease without esophagitis: Secondary | ICD-10-CM

## 2018-12-25 DIAGNOSIS — L853 Xerosis cutis: Secondary | ICD-10-CM

## 2018-12-25 DIAGNOSIS — L299 Pruritus, unspecified: Secondary | ICD-10-CM

## 2018-12-25 MED ORDER — CETAPHIL MOISTURIZING EX CREA: TOPICAL_CREAM | Freq: Two times a day (BID) | CUTANEOUS | 3 refills | Status: DC

## 2018-12-25 NOTE — Telephone Encounter (Signed)
List up dated.

## 2018-12-25 NOTE — Patient Instructions (Signed)
Pruritus Pruritus is an itchy feeling on the skin. One of the most common causes is dry skin, but many different things can cause itching. Most cases of itching do not require medical attention. Sometimes itchy skin can turn into a rash. Follow these instructions at home: Skin care   Apply moisturizing lotion to your skin as needed. Lotion that contains petroleum jelly is best.  Take medicines or apply medicated creams only as told by your health care provider. This may include: ? Corticosteroid cream. ? Anti-itch lotions. ? Oral antihistamines.  Apply a cool, wet cloth (cool compress) to the affected areas.  Take baths with one of the following: ? Epsom salts. You can get these at your local pharmacy or grocery store. Follow the instructions on the packaging. ? Baking soda. Pour a small amount into the bath as told by your health care provider. ? Colloidal oatmeal. You can get this at your local pharmacy or grocery store. Follow the instructions on the packaging.  Apply baking soda paste to your skin. To make the paste, stir water into a small amount of baking soda until it reaches a paste-like consistency.  Do not scratch your skin.  Do not take hot showers or baths, which can make itching worse. A cool shower may help with itching as long as you apply moisturizing lotion after the shower.  Do not use scented soaps, detergents, perfumes, and cosmetic products. Instead, use gentle, unscented versions of these items. General instructions  Avoid wearing tight clothes.  Keep a journal to help find out what is causing your itching. Write down: ? What you eat and drink. ? What cosmetic products you use. ? What soaps or detergents you use. ? What you wear, including jewelry.  Use a humidifier. This keeps the air moist, which helps to prevent dry skin.  Be aware of any changes in your itchiness. Contact a health care provider if:  The itching does not go away after several  days.  You are unusually thirsty or urinating more than normal.  Your skin tingles or feels numb.  Your skin or the white parts of your eyes turn yellow (jaundice).  You feel weak.  You have any of the following: ? Night sweats. ? Tiredness (fatigue). ? Weight loss. ? Abdominal pain. Summary  Pruritus is an itchy feeling on the skin. One of the most common causes is dry skin, but many different conditions and factors can cause itching.  Apply moisturizing lotion to your skin as needed. Lotion that contains petroleum jelly is best.  Take medicines or apply medicated creams only as told by your health care provider.  Do not take hot showers or baths. Do not use scented soaps, detergents, perfumes, or cosmetic products. This information is not intended to replace advice given to you by your health care provider. Make sure you discuss any questions you have with your health care provider. Document Released: 09/09/2010 Document Revised: 01/11/2017 Document Reviewed: 01/11/2017 Elsevier Patient Education  2020 [employer].

## 2018-12-25 NOTE — Telephone Encounter (Signed)
Patient saw Dr Thayer Ohm for appt this morning and told his wife that Dr Thayer Ohm wanted a medication list that patient takes. Wife called and says that patient takes Pantoprazole, Amlodipine, Gabapentin, Tizanidine, and Cyanocobalamin.

## 2018-12-25 NOTE — Progress Notes (Signed)
Subjective:  Patient ID: Todd Berg, male    DOB: [birth year], 68 y.o.   MRN: [id number]  Patient Care Team: Dettinger, [name], MD as PCP - General (Family Medicine) Frann Rider, NP as Nurse Practitioner (Nurse Practitioner)   Chief Complaint:  Pruritis (legs only) and Gastroesophageal Reflux   HPI: Todd Berg is a 68 y.o. male presenting on 12/25/2018 for Pruritis (legs only) and Gastroesophageal Reflux   Gastroesophageal Reflux He complains of belching, dysphagia, globus sensation and heartburn. He reports no abdominal pain, no chest pain, no choking, no coughing, no early satiety, no hoarse voice, no nausea, no sore throat, no stridor, no tooth decay, no water brash or no wheezing. This is a chronic problem. The current episode started more than 1 year ago. The problem occurs frequently. The problem has been waxing and waning. The heartburn duration is less than a minute. The heartburn is located in the abdomen. The heartburn is of mild intensity. The heartburn does not wake him from sleep. The heartburn does not limit his activity. The heartburn doesn't change with position. The symptoms are aggravated by certain foods, caffeine and lying down. Pertinent negatives include no anemia, fatigue, melena, muscle weakness, orthopnea or weight loss. Risk factors include caffeine use and hiatal hernia. He has tried a PPI for the symptoms. The treatment provided moderate relief. Past procedures include an EGD.  Rash This is a recurrent problem. The current episode started 1 to 4 weeks ago. The problem has been waxing and waning since onset. The affected locations include the left lower leg and right lower leg. The rash is characterized by itchiness. He was exposed to nothing. Pertinent negatives include no anorexia, congestion, cough, diarrhea, eye pain, facial edema, fatigue, fever, joint pain, nail changes, rhinorrhea, shortness of breath, sore throat or vomiting. Past treatments  include anti-itch cream and topical steroids. The treatment provided mild relief.     Relevant past medical, surgical, family, and social history reviewed and updated as indicated.  Allergies and medications reviewed and updated. Date reviewed: Chart in Epic.   Past Medical History:  Diagnosis Date  . Cancer ([facility]) 2015   rectal  . Chronic back pain   . COPD (chronic obstructive pulmonary disease) ([place])   . GERD (gastroesophageal reflux disease)   . Hematemesis   . Hemorrhoids   . Hyperlipidemia   . Hypotension   . Melena   . Melena 10/12/2017  . Stroke [facility]) 2019    Past Surgical History:  Procedure Laterality Date  . BACK SURGERY    . BIOPSY  10/13/2017   Procedure: BIOPSY;  Surgeon: Laurence Spates, MD;  Location: [facility] ENDOSCOPY;  Service: Endoscopy;;  . BIOPSY  11/20/2018   Procedure: BIOPSY;  Surgeon: Daneil Dolin, MD;  Location: [facility] ENDO SUITE;  Service: Endoscopy;;  . COLONOSCOPY  08/2015   Dr. [name]: normal  . ESOPHAGEAL DILATION  11/20/2018   Procedure: ESOPHAGEAL DILATION;  Surgeon: Daneil Dolin, MD;  Location: [facility] ENDO SUITE;  Service: Endoscopy;;  . ESOPHAGOGASTRODUODENOSCOPY N/A 06/09/2016   Dr. Wilford Corner: LA Grade A reflux esophagitis, non-bleeding gastric ulcers s/p epi. Large amount of old blood in dependent portions of stomach. H.pylori serology: negative.   . ESOPHAGOGASTRODUODENOSCOPY (EGD) WITH PROPOFOL Left 11/21/2016   Dr. Paulita Fujita: small hiatal hernia, benign-appearing esophageal stenosis, non-bleeding gastric ulcer without stigmata of bleeding. 6 mm in largest dimension. Gastritis. Normal duodenum. Felt to be NSAID-induced.   . ESOPHAGOGASTRODUODENOSCOPY (EGD) WITH PROPOFOL N/A 10/13/2017  Dr. Oletta Lamas: large hiatal hernia, one cratered gastric ulcer in preplyoric region of stomach, 8 mm in largest dimension s/p biopsy. Negative H.pylori  . ESOPHAGOGASTRODUODENOSCOPY (EGD) WITH PROPOFOL N/A 11/20/2018   Procedure: ESOPHAGOGASTRODUODENOSCOPY (EGD) WITH  PROPOFOL;  Surgeon: Daneil Dolin, MD;  Location: [facility] ENDO SUITE;  Service: Endoscopy;  Laterality: N/A;  . HEMORRHOID SURGERY    . HERNIA REPAIR    . RECTAL SURGERY      Social History   Socioeconomic History  . Marital status: Married    Spouse name: Not on file  . Number of children: 5  . Years of education: Not on file  . Highest education level: Not on file  Occupational History  . Occupation: Retired  Tobacco Use  . Smoking status: Former Smoker    Start date: 12/30/1964    Quit date: 06/19/2009    Years since quitting: 9.5  . Smokeless tobacco: Former Systems developer    Types: Snuff    Quit date: 09/26/2005  Substance and Sexual Activity  . Alcohol use: No  . Drug use: No  . Sexual activity: Not on file  Other Topics Concern  . Not on file  Social History Narrative   Married, 5 children, several grandchildren   Social Determinants of Health   Financial Resource Strain: Low Risk   . Difficulty of Paying Living Expenses: Not hard at all  Food Insecurity:   . Worried About Charity fundraiser in the Last Year: Not on file  . Ran Out of Food in the Last Year: Not on file  Transportation Needs:   . Lack of Transportation (Medical): Not on file  . Lack of Transportation (Non-Medical): Not on file  Physical Activity:   . Days of Exercise per Week: Not on file  . Minutes of Exercise per Session: Not on file  Stress:   . Feeling of Stress : Not on file  Social Connections:   . Frequency of Communication with Friends and Family: Not on file  . Frequency of Social Gatherings with Friends and Family: Not on file  . Attends Religious Services: Not on file  . Active Member of Clubs or Organizations: Not on file  . Attends Archivist Meetings: Not on file  . Marital Status: Not on file  Intimate Partner Violence: Not At Risk  . Fear of Current or Ex-Partner: No  . Emotionally Abused: No  . Physically Abused: No  . Sexually Abused: No    Outpatient Encounter  Medications as of 12/25/2018  Medication Sig  . amLODipine (NORVASC) 2.5 MG tablet Take 1 tablet (2.5 mg total) by mouth daily.  Marland Kitchen gabapentin (NEURONTIN) 100 MG capsule Take 1 capsule (100 mg total) by mouth 3 (three) times daily. (Patient taking differently: Take 100 mg by mouth daily as needed (for pain in legs). *May take one capsule 3 times daily as prescribed)  . pantoprazole (PROTONIX) 40 MG tablet Take 1 tablet (40 mg total) by mouth 2 (two) times daily before a meal.  . potassium chloride SA (K-DUR) 20 MEQ tablet Take 1 tablet (20 mEq total) by mouth 2 (two) times daily.  Marland Kitchen acetaminophen (TYLENOL) 500 MG tablet Take 1 tablet (500 mg total) by mouth every 6 (six) hours as needed. (Patient not taking: Reported on 11/19/2018)  . Cobalamin Combinations (B-12) [phone] MCG SUBL Place 1 tablet under the tongue daily.  . Emollient (CETAPHIL) cream Apply topically 2 (two) times daily.  Marland Kitchen tetrahydrozoline 0.05 % ophthalmic solution Place 1 drop  into both eyes every other day.   Marland Kitchen tiZANidine (ZANAFLEX) 2 MG tablet Take 1 tablet (2 mg total) by mouth every 8 (eight) hours as needed for muscle spasms.  Marland Kitchen triamcinolone cream (KENALOG) 0.1 % Apply 1 application topically 2 (two) times daily. (Patient not taking: Reported on 11/19/2018)  . [DISCONTINUED] nystatin ointment (MYCOSTATIN) Apply 1 application topically 2 (two) times daily.  . [DISCONTINUED] ondansetron (ZOFRAN) 4 MG tablet Take 1 tablet (4 mg total) by mouth every 6 (six) hours as needed for nausea.  . [DISCONTINUED] sildenafil (REVATIO) 20 MG tablet Take 1-3 tablets (20-60 mg total) by mouth as needed.   No facility-administered encounter medications on file as of 12/25/2018.    No Known Allergies  Review of Systems  Constitutional: Negative for activity change, appetite change, chills, diaphoresis, fatigue, fever, unexpected weight change and weight loss.  HENT: Positive for trouble swallowing. Negative for congestion, hoarse voice,  rhinorrhea, sore throat and voice change.   Eyes: Negative.  Negative for photophobia, pain and visual disturbance.  Respiratory: Negative for cough, choking, chest tightness, shortness of breath and wheezing.   Cardiovascular: Negative for chest pain, palpitations and leg swelling.  Gastrointestinal: Positive for dysphagia and heartburn. Negative for abdominal distention, abdominal pain, anal bleeding, anorexia, blood in stool, constipation, diarrhea, melena, nausea, rectal pain and vomiting.  Endocrine: Negative.   Genitourinary: Negative for decreased urine volume, difficulty urinating, dysuria, frequency and urgency.  Musculoskeletal: Negative for arthralgias, joint pain, myalgias and muscle weakness.  Skin: Positive for rash. Negative for color change, nail changes, pallor and wound.  Allergic/Immunologic: Negative.   Neurological: Negative for dizziness, tremors, seizures, syncope, facial asymmetry, speech difficulty, weakness, light-headedness, numbness and headaches.  Hematological: Negative.   Psychiatric/Behavioral: Negative for confusion, hallucinations, sleep disturbance and suicidal ideas.  All other systems reviewed and are negative.       Objective:  BP 114/65   Pulse (!) 59   Temp 98 F (36.7 C)   Resp 20   Ht 6' (1.829 m)   Wt 204 lb (92.5 kg)   SpO2 100%   BMI 27.67 kg/m    Wt Readings from Last 3 Encounters:  12/25/18 204 lb (92.5 kg)  11/19/18 240 lb (108.9 kg)  11/14/18 203 lb (92.1 kg)    Physical Exam Vitals and nursing note reviewed.  Constitutional:      General: He is not in acute distress.    Appearance: Normal appearance. He is well-developed and well-groomed. He is not ill-appearing, toxic-appearing or diaphoretic.  HENT:     Head: Normocephalic and atraumatic.     Jaw: There is normal jaw occlusion.     Right Ear: Hearing normal.     Left Ear: Hearing normal.     Nose: Nose normal.     Mouth/Throat:     Lips: Pink.     Mouth: Mucous  membranes are moist.     Pharynx: Oropharynx is clear. Uvula midline.  Eyes:     General: Lids are normal.     Extraocular Movements: Extraocular movements intact.     Conjunctiva/sclera: Conjunctivae normal.     Pupils: Pupils are equal, round, and reactive to light.  Neck:     Thyroid: No thyroid mass, thyromegaly or thyroid tenderness.     Vascular: No carotid bruit or JVD.     Trachea: Trachea and phonation normal.  Cardiovascular:     Rate and Rhythm: Normal rate and regular rhythm.     Chest Wall: PMI is not  displaced.     Pulses: Normal pulses.     Heart sounds: Normal heart sounds. No murmur. No friction rub. No gallop.   Pulmonary:     Effort: Pulmonary effort is normal. No respiratory distress.     Breath sounds: Normal breath sounds. No wheezing.  Abdominal:     General: Bowel sounds are normal. There is no distension or abdominal bruit.     Palpations: Abdomen is soft. There is no hepatomegaly or splenomegaly.     Tenderness: There is no abdominal tenderness. There is no right CVA tenderness or left CVA tenderness.     Hernia: No hernia is present.  Musculoskeletal:        General: Normal range of motion.     Cervical back: Normal range of motion and neck supple.     Right lower leg: No edema.     Left lower leg: No edema.  Lymphadenopathy:     Cervical: No cervical adenopathy.  Skin:    General: Skin is warm and dry.     Capillary Refill: Capillary refill takes less than 2 seconds.     Coloration: Skin is not cyanotic, jaundiced or pale.     Findings: No rash.     Comments: No visible rash to lower extremities, xerosis of bilateral lower legs present. No redness, wounds, drainage, or edema.  Neurological:     General: No focal deficit present.     Mental Status: He is alert and oriented to person, place, and time.     Cranial Nerves: Cranial nerves are intact. No cranial nerve deficit.     Sensory: Sensation is intact. No sensory deficit.     Motor: Motor  function is intact. No weakness.     Coordination: Coordination is intact. Coordination normal.     Gait: Gait is intact. Gait normal.     Deep Tendon Reflexes: Reflexes are normal and symmetric. Reflexes normal.  Psychiatric:        Attention and Perception: Attention and perception normal.        Mood and Affect: Mood and affect normal.        Speech: Speech normal.        Behavior: Behavior normal. Behavior is cooperative.        Thought Content: Thought content normal.        Cognition and Memory: Cognition and memory normal.        Judgment: Judgment normal.     Results for orders placed or performed during the hospital encounter of 12/06/18  Blood culture (routine x 2)   Specimen: BLOOD  Result Value Ref Range   Specimen Description      BLOOD LEFT WRIST Performed at [facility] [address], [place], [place] [address]    Special Requests      BOTTLES DRAWN AEROBIC AND ANAEROBIC Blood Culture adequate volume Performed at [facility] [address] [place], [place] [address]    Culture      NO GROWTH 5 DAYS Performed at [facility], [place] [address] [place], [place] [address]    Report Status 12/11/2018 FINAL   Urine Culture   Specimen: Urine, Random  Result Value Ref Range   Specimen Description      URINE, RANDOM Performed at [facility] [address], [place], [place] [address]    Special Requests      NONE Performed at [facility], [place] [address] [place],  [place] [address]    Culture (A)     <10,000 COLONIES/mL INSIGNIFICANT GROWTH Performed at [facility] [address] [place], [place] [address]    Report Status 12/07/2018 FINAL   CBC with Differential  Result Value Ref Range   WBC 7.4 4.0 - 10.5 K/uL   RBC 3.80 (L) 4.22 - 5.81 MIL/uL   Hemoglobin 9.9 (L) 13.0 - 17.0 g/dL   HCT 32.3 (L) 39.0 - 52.0 %   MCV 85.0 80.0 - 100.0 fL   MCH 26.1 26.0 - 34.0  pg   MCHC 30.7 30.0 - 36.0 g/dL   RDW 13.6 11.5 - 15.5 %   Platelets 359 150 - 400 K/uL   nRBC 0.0 0.0 - 0.2 %   Neutrophils Relative % 64 %   Neutro Abs 4.8 1.7 - 7.7 K/uL   Lymphocytes Relative 23 %   Lymphs Abs 1.7 0.7 - 4.0 K/uL   Monocytes Relative 9 %   Monocytes Absolute 0.6 0.1 - 1.0 K/uL   Eosinophils Relative 2 %   Eosinophils Absolute 0.1 0.0 - 0.5 K/uL   Basophils Relative 1 %   Basophils Absolute 0.1 0.0 - 0.1 K/uL   Immature Granulocytes 1 %   Abs Immature Granulocytes 0.04 0.00 - 0.07 K/uL  Comprehensive metabolic panel  Result Value Ref Range   Sodium 139 135 - 145 mmol/L   Potassium 3.3 (L) 3.5 - 5.1 mmol/L   Chloride 104 98 - 111 mmol/L   CO2 26 22 - 32 mmol/L   Glucose, Bld 88 70 - 99 mg/dL   BUN 15 8 - 23 mg/dL   Creatinine, Ser 0.91 0.61 - 1.24 mg/dL   Calcium 9.0 8.9 - 10.3 mg/dL   Total Protein 7.3 6.5 - 8.1 g/dL   Albumin 3.7 3.5 - 5.0 g/dL   AST 12 (L) 15 - 41 U/L   ALT 10 0 - 44 U/L   Alkaline Phosphatase 76 38 - 126 U/L   Total Bilirubin 0.2 (L) 0.3 - 1.2 mg/dL   GFR calc non Af Amer >60 >60 mL/min   GFR calc Af Amer >60 >60 mL/min   Anion gap 9 5 - 15  Lactic acid, plasma  Result Value Ref Range   Lactic Acid, Venous 1.4 0.5 - 1.9 mmol/L  Urinalysis, Routine w reflex microscopic  Result Value Ref Range   Color, Urine STRAW (A) YELLOW   APPearance CLEAR CLEAR   Specific Gravity, Urine 1.005 1.005 - 1.030   pH 7.0 5.0 - 8.0   Glucose, UA NEGATIVE NEGATIVE mg/dL   Hgb urine dipstick NEGATIVE NEGATIVE   Bilirubin Urine NEGATIVE NEGATIVE   Ketones, ur NEGATIVE NEGATIVE mg/dL   Protein, ur NEGATIVE NEGATIVE mg/dL   Nitrite NEGATIVE NEGATIVE   Leukocytes,Ua NEGATIVE NEGATIVE       Pertinent labs & imaging results that were available during my care of the patient were reviewed by me and considered in my medical decision making.  Assessment & Plan:  Siva was seen today for pruritis and gastroesophageal reflux.  Diagnoses and all orders  for this visit:  Pruritus Xerosis cutis No visible rash to lower extremities causing pruritis. Xerosis noted. Will trial below at least twice daily. Pt aware to report any new or worsening symptoms.  -     Emollient (CETAPHIL) cream; Apply topically 2 (two) times daily.  Gastroesophageal reflux disease without esophagitis Followed by GI, pt aware to make follow up appointment if symptoms persist or worsen. No  red flags present. Diet discussed. Avoid fried, spicy, fatty, greasy, and acidic foods. Avoid caffeine, nicotine, and alcohol. Do not eat 2-3 hours before bedtime and stay upright for at least 1-2 hours after eating. Eat small frequent meals. Avoid NSAID's like motrin and aleve. Medications as prescribed. Report any new or worsening symptoms. Follow up as discussed or sooner if needed.       Continue all other maintenance medications.  Follow up plan: Return in about 2 weeks (around 01/08/2019), or if symptoms worsen or fail to improve, for PCP.  Continue healthy lifestyle choices, including diet (rich in fruits, vegetables, and lean proteins, and low in salt and simple carbohydrates) and exercise (at least 30 minutes of moderate physical activity daily).  Educational handout given for pruritis   The above assessment and management plan was discussed with the patient. The patient verbalized understanding of and has agreed to the management plan. Patient is aware to call the clinic if they develop any new symptoms or if symptoms persist or worsen. Patient is aware when to return to the clinic for a follow-up visit. Patient educated on when it is appropriate to go to the emergency department.   Monia Pouch, FNP-C [facility] [phone]

## 2019-01-09 ENCOUNTER — Other Ambulatory Visit: Payer: Self-pay | Admitting: Family Medicine

## 2019-01-09 DIAGNOSIS — M542 Cervicalgia: Secondary | ICD-10-CM

## 2019-01-18 ENCOUNTER — Telehealth: Payer: Self-pay | Admitting: Family Medicine

## 2019-01-18 NOTE — Telephone Encounter (Signed)
Wants to speak to Referrals regarding referral for galbladder.

## 2019-01-23 NOTE — Telephone Encounter (Signed)
Called Patient & phone just rang - was unable to [facility] - Patient's Ref was sent to [facility] in [place].

## 2019-01-24 ENCOUNTER — Other Ambulatory Visit: Payer: Self-pay | Admitting: Family Medicine

## 2019-01-24 MED ORDER — CYANOCOBALAMIN (VIT B-12) 5,000 MCG SUBLINGUAL TABLET
0 days
Start: 2019-01-24 — End: ?

## 2019-01-24 NOTE — Telephone Encounter (Signed)
Pt spouse rc about referral for gallbladder. Please call back

## 2019-01-25 NOTE — Telephone Encounter (Signed)
Called Patient for the second time and phone just rang and was unable to leave a VM - Patient's Ref was sent to [facility] in [place] per HIS request- He needs to call their office at [phone] and see where they are with scheduling patient.

## 2019-02-19 ENCOUNTER — Ambulatory Visit: Payer: Medicare Other

## 2019-02-20 ENCOUNTER — Encounter: Payer: Self-pay | Admitting: Family Medicine

## 2019-02-20 ENCOUNTER — Ambulatory Visit (INDEPENDENT_AMBULATORY_CARE_PROVIDER_SITE_OTHER): Payer: Medicare Other | Admitting: Family Medicine

## 2019-02-20 DIAGNOSIS — L299 Pruritus, unspecified: Secondary | ICD-10-CM

## 2019-02-20 MED ORDER — CETIRIZINE HCL 10 MG PO TABS: 10.0000 mg | ORAL_TABLET | Freq: Every day | ORAL | 3 refills | Status: DC

## 2019-02-20 MED ORDER — PREDNISONE 10 MG (21) PO TBPK: ORAL_TABLET | ORAL | 0 refills | Status: DC

## 2019-02-20 NOTE — Progress Notes (Signed)
Subjective:    Patient ID: Todd Berg, male    DOB: [birth year], 69 y.o.   MRN: [id number]   HPI: Todd Berg is a 69 y.o. male presenting for itching. Not sure of the cause. Been going on all my life. No rash. It is on legs, around knees. Some on his back. Pt. Can't remember any more so he asks that I call back later when  his wife is home. Wife called back later. He has a cream to use but that doesn't seem to help. She  Tells me that he has been forgetful since his stroke. She is concerned it is coming from his meds.  She says there is no rash.  Is been treated for dry skin with Cetaphil and another cream that had been recommended by provider.  He has also been prescribed triamcinolone without resolution or improvement.   Depression screen [facility] 2/9 12/25/2018 11/02/2018 10/31/2018 10/12/2018 09/22/2018  Decreased Interest 0 0 0 0 0  Down, Depressed, Hopeless 0 0 0 0 0  PHQ - 2 Score 0 0 0 0 0  Some recent data might be hidden     Relevant past medical, surgical, family and social history reviewed and updated as indicated.  Interim medical history since our last visit reviewed. Allergies and medications reviewed and updated.  ROS:  Review of Systems  Constitutional: Negative for fever.  HENT: Negative.   Skin: Negative for color change, rash and wound.     Social History   Tobacco Use  Smoking Status Former Smoker  . Start date: 12/30/1964  . Quit date: 06/19/2009  . Years since quitting: 9.6  Smokeless Tobacco Former Systems developer  . Types: Snuff  . Quit date: 09/26/2005       Objective:     Wt Readings from Last 3 Encounters:  12/25/18 204 lb (92.5 kg)  11/19/18 240 lb (108.9 kg)  11/14/18 203 lb (92.1 kg)     Exam deferred. Pt. Harboring due to COVID 19. Phone visit performed.   Assessment & Plan:   1. Pruritus     Meds ordered this encounter  Medications  . cetirizine (ZYRTEC) 10 MG tablet    Sig: Take 1 tablet (10 mg total) by mouth daily. For itching      Dispense:  90 tablet    Refill:  3  . predniSONE (STERAPRED UNI-PAK 21 TAB) 10 MG (21) TBPK tablet    Sig: Use as directed    Dispense:  1 each    Refill:  0   The patient and his wife were directed to stop using the gabapentin and Zanaflex as they seem to be the most likely candidates for causing pruritus as a side effect.  Additionally he was prescribed cetirizine for daily use and a Sterapred unit pack to break the cycle of itching.  He should follow-up with Dr. Warrick Parisian with regard to the leg pain should the discontinuation of either medicine lead to increasing pain.  He is only been using the gabapentin 100 mg daily according to his wife.  It is probably too small a dose to have impacted his leg pain anyway.     Diagnoses and all orders for this visit:  Pruritus  Other orders -     cetirizine (ZYRTEC) 10 MG tablet; Take 1 tablet (10 mg total) by mouth daily. For itching -     predniSONE (STERAPRED UNI-PAK 21 TAB) 10 MG (21) TBPK tablet; Use as directed    Virtual  Visit via telephone Note  I discussed the limitations, risks, security and privacy concerns of performing an evaluation and management service by telephone and the availability of in person appointments. The patient was identified with two identifiers. Pt.expressed understanding and agreed to proceed. Pt. Is at home. Dr. Livia Snellen is in his office.  Follow Up Instructions:   I discussed the assessment and treatment plan with the patient. The patient was provided an opportunity to ask questions and all were answered. The patient agreed with the plan and demonstrated an understanding of the instructions.   The patient was advised to call back or seek an in-person evaluation if the symptoms worsen or if the condition fails to improve as anticipated.   Total minutes including chart review and phone contact time: 18   Follow up plan: Return if symptoms worsen or fail to improve.  Claretta Fraise, MD [place]

## 2019-03-06 ENCOUNTER — Encounter: Admit: 2019-03-06 | Discharge: 2019-03-07 | Payer: MEDICARE | Attending: Surgery | Primary: Surgery

## 2019-03-06 DIAGNOSIS — K259 Gastric ulcer, unspecified as acute or chronic, without hemorrhage or perforation: Principal | ICD-10-CM

## 2019-03-06 DIAGNOSIS — K824 Cholesterolosis of gallbladder: Principal | ICD-10-CM

## 2019-03-08 ENCOUNTER — Telehealth: Payer: Self-pay | Admitting: Family Medicine

## 2019-03-08 NOTE — Telephone Encounter (Signed)
Faxed

## 2019-03-08 NOTE — Telephone Encounter (Signed)
Pt's wife called and said that pt recently saw Dr Paulita Fujita in [place] and was told that they need pt to have results from ultrasound performed at hospital to determine how to treat patient. Can fax [place] Results to [phone].

## 2019-03-16 DIAGNOSIS — R1110 Vomiting, unspecified: Secondary | ICD-10-CM | POA: Diagnosis not present

## 2019-03-16 DIAGNOSIS — R1012 Left upper quadrant pain: Secondary | ICD-10-CM | POA: Diagnosis not present

## 2019-03-16 DIAGNOSIS — R142 Eructation: Secondary | ICD-10-CM | POA: Diagnosis not present

## 2019-03-16 DIAGNOSIS — D649 Anemia, unspecified: Secondary | ICD-10-CM | POA: Diagnosis not present

## 2019-03-16 DIAGNOSIS — K279 Peptic ulcer, site unspecified, unspecified as acute or chronic, without hemorrhage or perforation: Secondary | ICD-10-CM | POA: Diagnosis not present

## 2019-03-27 ENCOUNTER — Other Ambulatory Visit: Payer: Self-pay | Admitting: Family Medicine

## 2019-03-27 ENCOUNTER — Other Ambulatory Visit: Payer: Self-pay

## 2019-03-27 ENCOUNTER — Other Ambulatory Visit: Payer: Medicare Other

## 2019-03-27 DIAGNOSIS — K254 Chronic or unspecified gastric ulcer with hemorrhage: Secondary | ICD-10-CM

## 2019-03-28 LAB — CMP14+EGFR
ALT: 10 IU/L (ref 0–44)
AST: 11 IU/L (ref 0–40)
Albumin/Globulin Ratio: 1.6 (ref 1.2–2.2)
Albumin: 4.1 g/dL (ref 3.8–4.8)
Alkaline Phosphatase: 86 IU/L (ref 39–117)
BUN/Creatinine Ratio: 16 (ref 10–24)
BUN: 15 mg/dL (ref 8–27)
Bilirubin Total: <0.2 mg/dL (ref 0.0–1.2)
CO2: 24 mmol/L (ref 20–29)
Calcium: 9.7 mg/dL (ref 8.6–10.2)
Chloride: 103 mmol/L (ref 96–106)
Creatinine, Ser: 0.95 mg/dL (ref 0.76–1.27)
GFR calc Af Amer: 93 mL/min/{1.73_m2} (ref >59)
GFR calc non Af Amer: 81 mL/min/{1.73_m2} (ref >59)
Globulin, Total: 2.6 g/dL (ref 1.5–4.5)
Glucose: 82 mg/dL (ref 65–99)
Potassium: 4.5 mmol/L (ref 3.5–5.2)
Sodium: 140 mmol/L (ref 134–144)
Total Protein: 6.7 g/dL (ref 6.0–8.5)

## 2019-03-28 LAB — CBC WITH DIFFERENTIAL/PLATELET
Basophils Absolute: 0.1 10*3/uL (ref 0.0–0.2)
Basos: 1 %
EOS (ABSOLUTE): 0.2 10*3/uL (ref 0.0–0.4)
Eos: 2 %
Hematocrit: 36.9 % — ABNORMAL LOW (ref 37.5–51.0)
Hemoglobin: 10.6 g/dL — ABNORMAL LOW (ref 13.0–17.7)
Immature Grans (Abs): 0 10*3/uL (ref 0.0–0.1)
Immature Granulocytes: 0 %
Lymphocytes Absolute: 1.9 10*3/uL (ref 0.7–3.1)
Lymphs: 28 %
MCH: 21.3 pg — ABNORMAL LOW (ref 26.6–33.0)
MCHC: 28.7 g/dL — ABNORMAL LOW (ref 31.5–35.7)
MCV: 74 fL — ABNORMAL LOW (ref 79–97)
Monocytes Absolute: 0.6 10*3/uL (ref 0.1–0.9)
Monocytes: 10 %
Neutrophils Absolute: 4 10*3/uL (ref 1.4–7.0)
Neutrophils: 59 %
Platelets: 358 10*3/uL (ref 150–450)
RBC: 4.97 x10E6/uL (ref 4.14–5.80)
RDW: 15.4 % (ref 11.6–15.4)
WBC: 6.7 10*3/uL (ref 3.4–10.8)

## 2019-03-28 LAB — LIPASE: Lipase: 36 U/L (ref 13–78)

## 2019-03-28 LAB — FERRITIN: Ferritin: 8 ng/mL — ABNORMAL LOW (ref 30–400)

## 2019-03-28 LAB — IRON: Iron: 17 ug/dL — ABNORMAL LOW (ref 38–169)

## 2019-03-30 ENCOUNTER — Telehealth: Payer: Self-pay | Admitting: Family Medicine

## 2019-03-30 NOTE — Telephone Encounter (Signed)
Pt and pt's wife returned missed call regarding pts lab results. Made pts wife aware of the lab results per Dr Neldon Mc notes. Wife voiced understanding and confirmed that pt is not currently taking any iron supplements so he will need the Ferrous Sulfate 325mg  sent to [facility].

## 2019-04-05 ENCOUNTER — Telehealth: Payer: Self-pay | Admitting: Family Medicine

## 2019-04-05 NOTE — Chronic Care Management (AMB) (Signed)
  Chronic Care Management   Outreach Note  04/05/2019 Name: Todd Berg MRN: [id number] DOB: 02/06/1950  Todd Berg is a 69 y.o. year old male who is a primary care patient of Dettinger, [name], MD. I reached out to Todd Berg by phone today in response to a referral sent by Todd Berg's health plan.     An unsuccessful telephone outreach was attempted today. The patient was referred to the case management team for assistance with care management and care coordination.   Follow Up Plan: A HIPPA compliant phone message was left for the patient providing contact information and requesting a return call.  The care management team will reach out to the patient again over the next 7 days.  If patient returns call to provider office, please advise to call [facility]  at [place], [place], [place], [place] [address] Direct Dial: [phone] [email or web address] Website: [email or web address]

## 2019-04-09 NOTE — Chronic Care Management (AMB) (Signed)
  Chronic Care Management   Outreach Note  04/09/2019 Name: Todd Berg MRN: [id number] DOB: 06/20/1950  Todd Berg is a 69 y.o. year old male who is a primary care patient of Dettinger, [name], MD. I reached out to Todd Berg by phone today in response to a referral sent by Todd Berg's health plan.     A second unsuccessful telephone outreach was attempted today. The patient was referred to the case management team for assistance with care management and care coordination.   Follow Up Plan: The care management team will reach out to the patient again over the next 7 days.  If patient returns call to provider office, please advise to call [facility] at [place], [place], [place], [place] [address] Direct Dial: [phone] [email or web address] Website: [email or web address]

## 2019-04-11 NOTE — Chronic Care Management (AMB) (Signed)
  Chronic Care Management   Outreach Note  04/11/2019 Name: Todd Berg MRN: [id number] DOB: 01-21-1950  Todd Berg is a 69 y.o. year old male who is a primary care patient of Dettinger, [name], MD. I reached out to [name] by phone today in response to a referral sent by [name]'s health plan.     Third unsuccessful telephone outreach was attempted today. The patient was referred to the case management team for assistance with care management and care coordination. The patient's primary care provider has been notified of our unsuccessful attempts to make or maintain contact with the patient. The care management team is pleased to engage with this patient at any time in the future should he/she be interested in assistance from the care management team.   Follow Up Plan: The care management team is available to follow up with the patient after provider conversation with the patient regarding recommendation for care management engagement and subsequent re-referral to the care management team.   Noreene Larsson, [place] [address], [place], [place] [address] Direct Dial: [phone] [email or web address] Website: [email or web address]

## 2019-06-05 ENCOUNTER — Ambulatory Visit (INDEPENDENT_AMBULATORY_CARE_PROVIDER_SITE_OTHER): Payer: Medicare Other | Admitting: Family Medicine

## 2019-06-05 ENCOUNTER — Other Ambulatory Visit: Payer: Self-pay

## 2019-06-05 ENCOUNTER — Encounter: Payer: Self-pay | Admitting: Family Medicine

## 2019-06-05 VITALS — BP 133/79 | HR 70 | Temp 98.0°F | Resp 18 | Ht 72.0 in | Wt 211.0 lb

## 2019-06-05 DIAGNOSIS — L299 Pruritus, unspecified: Secondary | ICD-10-CM

## 2019-06-05 DIAGNOSIS — K219 Gastro-esophageal reflux disease without esophagitis: Secondary | ICD-10-CM | POA: Diagnosis not present

## 2019-06-05 MED ORDER — CETIRIZINE HCL 10 MG PO TABS: 10.0000 mg | ORAL_TABLET | Freq: Every day | ORAL | 0 refills | Status: AC

## 2019-06-05 MED ORDER — PANTOPRAZOLE SODIUM 40 MG PO TBEC: 40.0000 mg | DELAYED_RELEASE_TABLET | Freq: Two times a day (BID) | ORAL | 0 refills | Status: AC

## 2019-06-05 NOTE — Progress Notes (Signed)
Assessment & Plan:  1. Gastroesophageal reflux disease without esophagitis - Refilled medication. Advised patient to let us know if symptoms do not improve being back on his medication.  - pantoprazole (PROTONIX) 40 MG tablet; Take 1 tablet (40 mg total) by mouth 2 (two) times daily before a meal.  Dispense: 180 tablet; Refill: 0  2. Pruritus - Refilled medication. Advised patient to let us know if symptoms do not improve being back on his medication.  - cetirizine (ZYRTEC) 10 MG tablet; Take 1 tablet (10 mg total) by mouth daily. For itching  Dispense: 90 tablet; Refill: 0   Follow up plan: Return with PCP, for follow-up of chronic medication conditions.  Hendricks Limes, MSN, APRN, FNP-C [facility]  Subjective:   Patient ID: Todd Berg, male    DOB: Dec 13, 1950, 69 y.o.   MRN: [id number]  HPI: Todd Berg is a 69 y.o. male presenting on 06/05/2019 for gi upset (vomits food back up after eating - going on for several months ) and dry skin on legs (itches - no rash )  Patient reports he has been vomiting food back up after eating for several months. He has been out of his Protonix for "a long time".  He is also concerned about itching to both legs. He has been out of his Zyrtec that he takes for itching for "a long time" as well.   ROS: Negative unless specifically indicated above in HPI.   Relevant past medical history reviewed and updated as indicated.   Allergies and medications reviewed and updated.   Current Outpatient Medications:  .  amLODipine (NORVASC) 2.5 MG tablet, Take 1 tablet (2.5 mg total) by mouth daily., Disp: 90 tablet, Rfl: 3 .  Cyanocobalamin (VITAMIN B-12) 5000 MCG SUBL, DISSOLVE 1 TABLET UNDER TONGUE DAILY, Disp: 110 tablet, Rfl: 0 .  cetirizine (ZYRTEC) 10 MG tablet, Take 1 tablet (10 mg total) by mouth daily. For itching, Disp: 90 tablet, Rfl: 0 .  pantoprazole (PROTONIX) 40 MG tablet, Take 1 tablet (40 mg total) by mouth 2  (two) times daily before a meal., Disp: 180 tablet, Rfl: 0  No Known Allergies  Objective:   BP 133/79 (BP Location: Right Arm, Cuff Size: Normal)   Pulse 70   Temp 98 F (36.7 C)   Resp 18   Ht 6' (1.829 m)   Wt 211 lb (95.7 kg)   SpO2 97%   BMI 28.62 kg/m    Physical Exam Vitals reviewed.  Constitutional:      General: He is not in acute distress.    Appearance: Normal appearance. He is overweight. He is not ill-appearing, toxic-appearing or diaphoretic.  HENT:     Head: Normocephalic and atraumatic.  Eyes:     General: No scleral icterus.       Right eye: No discharge.        Left eye: No discharge.     Conjunctiva/sclera: Conjunctivae normal.  Cardiovascular:     Rate and Rhythm: Normal rate and regular rhythm.     Heart sounds: Normal heart sounds. No murmur. No friction rub. No gallop.   Pulmonary:     Effort: Pulmonary effort is normal. No respiratory distress.     Breath sounds: Normal breath sounds. No stridor. No wheezing, rhonchi or rales.  Musculoskeletal:        General: Normal range of motion.     Cervical back: Normal range of motion.  Skin:    General: Skin is  warm and dry.  Neurological:     Mental Status: He is alert and oriented to person, place, and time. Mental status is at baseline.  Psychiatric:        Mood and Affect: Mood normal.        Behavior: Behavior normal.        Thought Content: Thought content normal.        Judgment: Judgment normal.

## 2019-07-06 ENCOUNTER — Encounter: Payer: Self-pay | Admitting: Physician Assistant

## 2019-07-06 ENCOUNTER — Ambulatory Visit (INDEPENDENT_AMBULATORY_CARE_PROVIDER_SITE_OTHER): Payer: Medicare Other | Admitting: Physician Assistant

## 2019-07-06 ENCOUNTER — Other Ambulatory Visit: Payer: Self-pay

## 2019-07-06 VITALS — BP 127/80 | HR 75 | Temp 98.0°F | Ht 72.0 in | Wt 216.0 lb

## 2019-07-06 DIAGNOSIS — L989 Disorder of the skin and subcutaneous tissue, unspecified: Secondary | ICD-10-CM

## 2019-07-06 DIAGNOSIS — R5383 Other fatigue: Secondary | ICD-10-CM | POA: Diagnosis not present

## 2019-07-06 DIAGNOSIS — D500 Iron deficiency anemia secondary to blood loss (chronic): Secondary | ICD-10-CM | POA: Diagnosis not present

## 2019-07-06 NOTE — Progress Notes (Signed)
°  Subjective:     Patient ID: Todd Berg, male   DOB: [birth year], 69 y.o.   MRN: [id number]  HPI Pt here due to 2 reasons #1- non healing lesion to the bridge of the noses States area will scab up and then return #2- states his wife wanted him seen due to fatigue States he sleeps fine going to bed ~ 9 and sleeps until the next day States family does not complain about snoring He will fall asleep watching his " cowboy show" but only for a short period of time Review of chart shows hx of anemia due to ulcers  Review of Systems  Constitutional: Positive for fatigue. Negative for activity change, appetite change, fever and unexpected weight change.  Respiratory: Negative.   Cardiovascular: Negative.   Gastrointestinal: Negative.        Objective:   Physical Exam Vitals and nursing note reviewed.  Constitutional:      General: He is not in acute distress.    Appearance: Normal appearance. He is normal weight. He is not ill-appearing or toxic-appearing.  Cardiovascular:     Rate and Rhythm: Normal rate and regular rhythm.     Pulses: Normal pulses.     Heart sounds: Normal heart sounds.  Pulmonary:     Effort: Pulmonary effort is normal.     Breath sounds: Normal breath sounds.  Abdominal:     General: There is no distension.     Palpations: Abdomen is soft. There is no mass.     Tenderness: There is no abdominal tenderness. There is no right CVA tenderness or left CVA tenderness.     Hernia: No hernia is present.  Skin:    Findings: Lesion present.     Comments: Scaling lesion to the bridge of the nose No drainage or ulceration noted No surrounding erythema,edema, or induration  Neurological:     Mental Status: He is alert.        Assessment:     1. Other fatigue   2. Non-healing skin lesion of nose   3. Iron deficiency anemia due to chronic blood loss        Plan:     Referral to derm regarding non healing lesion to the nose CMP/CBC due to hx of anemia  and c/o fatigue Good diet reviewed F/U pending labs

## 2019-07-06 NOTE — Patient Instructions (Signed)
Anemia  Anemia is a condition in which you do not have enough red blood cells or hemoglobin. Hemoglobin is a substance in red blood cells that carries oxygen. When you do not have enough red blood cells or hemoglobin (are anemic), your body cannot get enough oxygen and your organs may not work properly. As a result, you may feel very tired or have other problems. What are the causes? Common causes of anemia include:  Excessive bleeding. Anemia can be caused by excessive bleeding inside or outside the body, including bleeding from the intestine or from periods in women.  Poor nutrition.  Long-lasting (chronic) kidney, thyroid, and liver disease.  Bone marrow disorders.  Cancer and treatments for cancer.  HIV (human immunodeficiency virus) and AIDS (acquired immunodeficiency syndrome).  Treatments for HIV and AIDS.  Spleen problems.  Blood disorders.  Infections, medicines, and autoimmune disorders that destroy red blood cells. What are the signs or symptoms? Symptoms of this condition include:  Minor weakness.  Dizziness.  Headache.  Feeling heartbeats that are irregular or faster than normal (palpitations).  Shortness of breath, especially with exercise.  Paleness.  Cold sensitivity.  Indigestion.  Nausea.  Difficulty sleeping.  Difficulty concentrating. Symptoms may occur suddenly or develop slowly. If your anemia is mild, you may not have symptoms. How is this diagnosed? This condition is diagnosed based on:  Blood tests.  Your medical history.  A physical exam.  Bone marrow biopsy. Your health care provider may also check your stool (feces) for blood and may do additional testing to look for the cause of your bleeding. You may also have other tests, including:  Imaging tests, such as a CT scan or MRI.  Endoscopy.  Colonoscopy. How is this treated? Treatment for this condition depends on the cause. If you continue to lose a lot of blood, you may  need to be treated at a hospital. Treatment may include:  Taking supplements of iron, vitamin S31, or folic acid.  Taking a hormone medicine (erythropoietin) that can help to stimulate red blood cell growth.  Having a blood transfusion. This may be needed if you lose a lot of blood.  Making changes to your diet.  Having surgery to remove your spleen. Follow these instructions at home:  Take over-the-counter and prescription medicines only as told by your health care provider.  Take supplements only as told by your health care provider.  Follow any diet instructions that you were given.  Keep all follow-up visits as told by your health care provider. This is important. Contact a health care provider if:  You develop new bleeding anywhere in the body. Get help right away if:  You are very weak.  You are short of breath.  You have pain in your abdomen or chest.  You are dizzy or feel faint.  You have trouble concentrating.  You have bloody or black, tarry stools.  You vomit repeatedly or you vomit up blood. Summary  Anemia is a condition in which you do not have enough red blood cells or enough of a substance in your red blood cells that carries oxygen (hemoglobin).  Symptoms may occur suddenly or develop slowly.  If your anemia is mild, you may not have symptoms.  This condition is diagnosed with blood tests as well as a medical history and physical exam. Other tests may be needed.  Treatment for this condition depends on the cause of the anemia. This information is not intended to replace advice given to you by  your health care provider. Make sure you discuss any questions you have with your health care provider. Document Revised: 12/10/2016 Document Reviewed: 01/30/2016 Elsevier Patient Education  [place].

## 2019-07-07 LAB — CBC WITH DIFFERENTIAL/PLATELET
Basophils Absolute: 0.1 10*3/uL (ref 0.0–0.2)
Basos: 1 %
EOS (ABSOLUTE): 0.2 10*3/uL (ref 0.0–0.4)
Eos: 3 %
Hematocrit: 41.4 % (ref 37.5–51.0)
Hemoglobin: 13.1 g/dL (ref 13.0–17.7)
Immature Grans (Abs): 0 10*3/uL (ref 0.0–0.1)
Immature Granulocytes: 0 %
Lymphocytes Absolute: 1.5 10*3/uL (ref 0.7–3.1)
Lymphs: 23 %
MCH: 26.3 pg — ABNORMAL LOW (ref 26.6–33.0)
MCHC: 31.6 g/dL (ref 31.5–35.7)
MCV: 83 fL (ref 79–97)
Monocytes Absolute: 0.6 10*3/uL (ref 0.1–0.9)
Monocytes: 9 %
Neutrophils Absolute: 4.1 10*3/uL (ref 1.4–7.0)
Neutrophils: 64 %
Platelets: 279 10*3/uL (ref 150–450)
RBC: 4.98 x10E6/uL (ref 4.14–5.80)
RDW: 16.6 % — ABNORMAL HIGH (ref 11.6–15.4)
WBC: 6.5 10*3/uL (ref 3.4–10.8)

## 2019-07-07 LAB — CMP14+EGFR
ALT: 9 IU/L (ref 0–44)
AST: 9 IU/L (ref 0–40)
Albumin/Globulin Ratio: 1.6 (ref 1.2–2.2)
Albumin: 4 g/dL (ref 3.8–4.8)
Alkaline Phosphatase: 92 IU/L (ref 48–121)
BUN/Creatinine Ratio: 17 (ref 10–24)
BUN: 15 mg/dL (ref 8–27)
Bilirubin Total: 0.2 mg/dL (ref 0.0–1.2)
CO2: 25 mmol/L (ref 20–29)
Calcium: 8.8 mg/dL (ref 8.6–10.2)
Chloride: 107 mmol/L — ABNORMAL HIGH (ref 96–106)
Creatinine, Ser: 0.9 mg/dL (ref 0.76–1.27)
GFR calc Af Amer: 100 mL/min/{1.73_m2} (ref >59)
GFR calc non Af Amer: 86 mL/min/{1.73_m2} (ref >59)
Globulin, Total: 2.5 g/dL (ref 1.5–4.5)
Glucose: 113 mg/dL — ABNORMAL HIGH (ref 65–99)
Potassium: 3.6 mmol/L (ref 3.5–5.2)
Sodium: 144 mmol/L (ref 134–144)
Total Protein: 6.5 g/dL (ref 6.0–8.5)

## 2019-07-08 ENCOUNTER — Emergency Department (HOSPITAL_COMMUNITY)
Admission: EM | Admit: 2019-07-08 | Discharge: 2019-07-08 | Discharge status: Against Medical Advice | Payer: Medicare Other

## 2019-07-10 ENCOUNTER — Ambulatory Visit (INDEPENDENT_AMBULATORY_CARE_PROVIDER_SITE_OTHER): Payer: Medicare Other | Admitting: Family Medicine

## 2019-07-10 ENCOUNTER — Encounter: Payer: Self-pay | Admitting: Family Medicine

## 2019-07-10 ENCOUNTER — Other Ambulatory Visit: Payer: Self-pay

## 2019-07-10 ENCOUNTER — Ambulatory Visit (INDEPENDENT_AMBULATORY_CARE_PROVIDER_SITE_OTHER): Payer: Medicare Other

## 2019-07-10 VITALS — BP 139/82 | HR 70 | Temp 97.8°F | Ht 73.0 in | Wt 211.4 lb

## 2019-07-10 DIAGNOSIS — M5441 Lumbago with sciatica, right side: Secondary | ICD-10-CM

## 2019-07-10 DIAGNOSIS — M5442 Lumbago with sciatica, left side: Secondary | ICD-10-CM | POA: Diagnosis not present

## 2019-07-10 DIAGNOSIS — G8929 Other chronic pain: Secondary | ICD-10-CM | POA: Diagnosis not present

## 2019-07-10 DIAGNOSIS — M549 Dorsalgia, unspecified: Secondary | ICD-10-CM

## 2019-07-10 DIAGNOSIS — M5134 Other intervertebral disc degeneration, thoracic region: Secondary | ICD-10-CM | POA: Diagnosis not present

## 2019-07-10 DIAGNOSIS — M5137 Other intervertebral disc degeneration, lumbosacral region: Secondary | ICD-10-CM | POA: Diagnosis not present

## 2019-07-10 NOTE — Progress Notes (Signed)
   Assessment & Plan:  1-2. Chronic bilateral low back pain with bilateral sciatica/Mid back pain, chronic - Tylenol 1000 mg 3 times daily for pain. - DG Lumbar Spine 2-3 Views - Ambulatory referral to Physical Therapy - DG Thoracic Spine W/Swimmers   Follow up plan: Return if symptoms worsen or fail to improve.  Hendricks Limes, MSN, APRN, FNP-C [facility]  Subjective:   Patient ID: Todd Berg, male    DOB: 1950/03/10, 69 y.o.   MRN: [id number]  HPI: Todd Berg is a 69 y.o. male presenting on 07/10/2019 for Back Pain (Patient states that it has been ongoing and requested a x ray.)  Patient reports chronic low back pain bilaterally that radiates down his legs.  He reports it is sometimes hard to walk.  This pain has been ongoing for the past 4 to 5 months.  He does have a plate in his low back.  He is unable to rate the pain or describe it besides "it just hurts".   ROS: Negative unless specifically indicated above in HPI.   Relevant past medical history reviewed and updated as indicated.   Allergies and medications reviewed and updated.   Current Outpatient Medications:  .  amLODipine (NORVASC) 2.5 MG tablet, Take 1 tablet (2.5 mg total) by mouth daily., Disp: 90 tablet, Rfl: 3 .  cetirizine (ZYRTEC) 10 MG tablet, Take 1 tablet (10 mg total) by mouth daily. For itching, Disp: 90 tablet, Rfl: 0 .  Cyanocobalamin (VITAMIN B-12) 5000 MCG SUBL, DISSOLVE 1 TABLET UNDER TONGUE DAILY, Disp: 110 tablet, Rfl: 0 .  pantoprazole (PROTONIX) 40 MG tablet, Take 1 tablet (40 mg total) by mouth 2 (two) times daily before a meal., Disp: 180 tablet, Rfl: 0  No Known Allergies  Objective:   BP 139/82   Pulse 70   Temp 97.8 F (36.6 C) (Temporal)   Ht 6\' 1"  (1.854 m)   Wt 211 lb 6.4 oz (95.9 kg)   SpO2 95%   BMI 27.89 kg/m    Physical Exam Vitals reviewed.  Constitutional:      General: He is not in acute distress.    Appearance: Normal appearance. He  is not ill-appearing, toxic-appearing or diaphoretic.  HENT:     Head: Normocephalic and atraumatic.  Eyes:     General: No scleral icterus.       Right eye: No discharge.        Left eye: No discharge.     Conjunctiva/sclera: Conjunctivae normal.  Cardiovascular:     Rate and Rhythm: Normal rate.  Pulmonary:     Effort: Pulmonary effort is normal. No respiratory distress.  Musculoskeletal:        General: Normal range of motion.     Cervical back: Normal range of motion.     Lumbar back: Tenderness present.  Skin:    General: Skin is warm and dry.  Neurological:     Mental Status: He is alert and oriented to person, place, and time. Mental status is at baseline.  Psychiatric:        Mood and Affect: Mood normal.        Behavior: Behavior normal.        Thought Content: Thought content normal.        Judgment: Judgment normal.

## 2019-07-10 NOTE — Patient Instructions (Signed)
Tylenol 1,000 mg three times a day for pain.  Voltaren gel four times a day as needed - rub on joints.

## 2019-07-11 ENCOUNTER — Telehealth: Payer: Self-pay | Admitting: Family Medicine

## 2019-07-11 NOTE — Telephone Encounter (Signed)
Pts wife called requesting to speak with nurse to go over pts lab results. Wife said she asked pt about them but pt said he couldn't remember. Wife also wants to verify whether or not Karsten Fells is referring pt for PT. Wife says pt mentioned something about that yesterday after visit.

## 2019-07-11 NOTE — Telephone Encounter (Signed)
Patient did not have lab work completed yesterday. He did have x-rays that did not show any fracture. They did show disc space narrowing and the hardware from his previous surgery. He is being referred to PT but I have not closed his chart yet.

## 2019-07-11 NOTE — Telephone Encounter (Signed)
Can you review the labs patient had drawn on 07/06/2019 when he saw Osa Craver.  Those labs have not been reviewed yet.

## 2019-07-11 NOTE — Telephone Encounter (Signed)
Please review labs and advise.  Also, I did not see any mention of referring patient to PT.

## 2019-07-12 NOTE — Telephone Encounter (Signed)
Wife aware of lab results and verbalizes understanding per dpr.

## 2019-07-12 NOTE — Telephone Encounter (Signed)
NVM

## 2019-07-12 NOTE — Telephone Encounter (Signed)
No significant abnormalities in his lab work.

## 2019-07-16 ENCOUNTER — Encounter: Payer: Self-pay | Admitting: Family Medicine

## 2019-07-18 ENCOUNTER — Encounter: Payer: Self-pay | Admitting: *Deleted

## 2019-07-23 ENCOUNTER — Ambulatory Visit (INDEPENDENT_AMBULATORY_CARE_PROVIDER_SITE_OTHER): Payer: Medicare Other | Admitting: Family Medicine

## 2019-07-23 ENCOUNTER — Other Ambulatory Visit: Payer: Self-pay

## 2019-07-23 ENCOUNTER — Encounter: Payer: Self-pay | Admitting: Family Medicine

## 2019-07-23 VITALS — BP 128/75 | HR 74 | Temp 97.3°F | Ht 73.0 in | Wt 209.0 lb

## 2019-07-23 DIAGNOSIS — R413 Other amnesia: Secondary | ICD-10-CM | POA: Diagnosis not present

## 2019-07-23 NOTE — Progress Notes (Signed)
BP 128/75   Pulse 74   Temp (!) 97.3 F (36.3 C)   Ht 6\' 1"  (1.854 m)   Wt 209 lb (94.8 kg)   SpO2 97%   BMI 27.57 kg/m    Subjective:   Patient ID: Todd Berg, male    DOB: 11/13/1950, 69 y.o.   MRN: [id number]  HPI: Todd Berg is a 69 y.o. male presenting on 07/23/2019 for Medical Management of Chronic Issues and Dementia   HPI Memory issues Gradually increasing over the past 2 years.  He did have an MMSE score and 30 but today on his MMSE scores a 9 out of 30.  Patient has been worsening.  He is on his own and not he was a family member today.  Patient says his memory is worsening and he can remember his home address but he cannot state the year or the president or anything else. MMSE - Mini Mental State Exam 09/06/2017  Orientation to time 5  Orientation to Place 5  Registration 3  Attention/ Calculation 5  Recall 3  Language- name 2 objects 2  Language- repeat 1  Language- follow 3 step command 3  Language- read & follow direction 1  Write a sentence 1  Copy design 1  Total score 30     Relevant past medical, surgical, family and social history reviewed and updated as indicated. Interim medical history since our last visit reviewed. Allergies and medications reviewed and updated.  Review of Systems  Constitutional: Negative for chills and fever.  Eyes: Negative for visual disturbance.  Respiratory: Negative for shortness of breath and wheezing.   Cardiovascular: Negative for chest pain and leg swelling.  Musculoskeletal: Negative for back pain and gait problem.  Skin: Negative for rash.  Neurological: Negative for dizziness.  Psychiatric/Behavioral: Positive for confusion. Negative for self-injury, sleep disturbance and suicidal ideas. The patient is not nervous/anxious.   All other systems reviewed and are negative.   Per HPI unless specifically indicated above   Allergies as of 07/23/2019   No Known Allergies     Medication List        Accurate as of July 23, 2019  1:57 PM. If you have any questions, ask your nurse or doctor.        amLODipine 2.5 MG tablet Commonly known as: NORVASC Take 1 tablet (2.5 mg total) by mouth daily.   cetirizine 10 MG tablet Commonly known as: ZYRTEC Take 1 tablet (10 mg total) by mouth daily. For itching   pantoprazole 40 MG tablet Commonly known as: PROTONIX Take 1 tablet (40 mg total) by mouth 2 (two) times daily before a meal.   Vitamin B-12 5000 MCG Subl DISSOLVE 1 TABLET UNDER TONGUE DAILY        Objective:   BP 128/75   Pulse 74   Temp (!) 97.3 F (36.3 C)   Ht 6\' 1"  (1.854 m)   Wt 209 lb (94.8 kg)   SpO2 97%   BMI 27.57 kg/m   Wt Readings from Last 3 Encounters:  07/23/19 209 lb (94.8 kg)  07/10/19 211 lb 6.4 oz (95.9 kg)  07/06/19 216 lb (98 kg)    Physical Exam Vitals and nursing note reviewed.  Constitutional:      General: He is not in acute distress.    Appearance: He is well-developed. He is not diaphoretic.  Eyes:     General: No scleral icterus.    Conjunctiva/sclera: Conjunctivae normal.  Neck:  Thyroid: No thyromegaly.  Cardiovascular:     Rate and Rhythm: Normal rate and regular rhythm.     Heart sounds: Normal heart sounds. No murmur heard.   Pulmonary:     Effort: Pulmonary effort is normal. No respiratory distress.     Breath sounds: Normal breath sounds. No wheezing.  Musculoskeletal:        General: Normal range of motion.     Cervical back: Neck supple.  Lymphadenopathy:     Cervical: No cervical adenopathy.  Skin:    General: Skin is warm and dry.     Findings: No rash.  Neurological:     Mental Status: He is alert and oriented to person, place, and time.     Coordination: Coordination normal.  Psychiatric:        Behavior: Behavior normal.        Cognition and Memory: Memory is impaired. He exhibits impaired recent memory and impaired remote memory.       Assessment & Plan:   Problem List Items Addressed This  Visit    None    Visit Diagnoses    Memory deficits    -  Primary   Relevant Orders   MR Brain Wo Contrast   Ambulatory referral to Neurology      Patient has worsening memory, will send him to neurology and get an MRI, have ordered the MRI. Follow up plan: Return if symptoms worsen or fail to improve.  Counseling provided for all of the vaccine components No orders of the defined types were placed in this encounter.   Caryl Pina, MD [facility] 07/23/2019, 1:57 PM

## 2019-07-24 ENCOUNTER — Telehealth: Payer: Self-pay | Admitting: Family Medicine

## 2019-07-24 NOTE — Telephone Encounter (Signed)
Spoke to pt's wife and advised that Dr Warrick Parisian ordered MRI of the brain as well as sending pt to Neurology. Pt's wife voiced understanding.

## 2019-07-26 ENCOUNTER — Telehealth: Payer: Self-pay | Admitting: Family Medicine

## 2019-07-31 ENCOUNTER — Telehealth: Payer: Self-pay

## 2019-07-31 ENCOUNTER — Telehealth: Payer: Self-pay | Admitting: Family Medicine

## 2019-07-31 NOTE — Telephone Encounter (Signed)
Called and gave patient number to call the office as per note in chart they have attempt to contact patient

## 2019-07-31 NOTE — Telephone Encounter (Signed)
Patients wife called and said she needed to schedule an appt for her husband.

## 2019-08-17 ENCOUNTER — Ambulatory Visit (HOSPITAL_COMMUNITY)
Admission: RE | Admit: 2019-08-17 | Discharge: 2019-08-17 | Disposition: A | Discharge status: Home / Self Care | Payer: Medicare Other | Source: Ambulatory Visit | Attending: Family Medicine | Admitting: Family Medicine

## 2019-08-17 ENCOUNTER — Other Ambulatory Visit: Payer: Self-pay

## 2019-08-17 DIAGNOSIS — R413 Other amnesia: Secondary | ICD-10-CM | POA: Diagnosis not present

## 2019-08-17 DIAGNOSIS — I6381 Other cerebral infarction due to occlusion or stenosis of small artery: Secondary | ICD-10-CM | POA: Diagnosis not present

## 2019-08-17 DIAGNOSIS — J3489 Other specified disorders of nose and nasal sinuses: Secondary | ICD-10-CM | POA: Diagnosis not present

## 2019-08-17 DIAGNOSIS — I6501 Occlusion and stenosis of right vertebral artery: Secondary | ICD-10-CM | POA: Diagnosis not present

## 2019-08-17 DIAGNOSIS — I6782 Cerebral ischemia: Secondary | ICD-10-CM | POA: Diagnosis not present

## 2019-08-20 ENCOUNTER — Telehealth: Payer: Self-pay | Admitting: Family Medicine

## 2019-08-20 NOTE — Telephone Encounter (Signed)
I think the only thing that can be done at this point is to go see a neurologist, I think he already had a neurologist but if he has not then we need to put in an order for neurology.

## 2019-08-20 NOTE — Telephone Encounter (Signed)
Reviewed MRI results with pt's wife. Wife voiced understanding but said that they were unaware that pt has had another stroke since having his last MRI. Wife wants to know what are the next steps? Is there anything that can be done to help stop/slow the process of his memory loss?

## 2019-08-20 NOTE — Telephone Encounter (Signed)
Pt's wife states he has an appt with the neurologist 09/20/19 and was advised of provider feedback and voiced understanding.

## 2019-08-24 ENCOUNTER — Telehealth: Payer: Self-pay | Admitting: Family Medicine

## 2019-08-24 NOTE — Telephone Encounter (Signed)
That is something we are going to have to discuss with the neurologist, because of his history of the bleeding I do not know if he is a great candidate, if we were going to put him on something that would be aspirin but he has had all of the bleeding when he was on the aspirin before.  We need to discuss this with the neurologist, have they got an appointment with them yet? Caryl Pina, MD [facility] 08/24/2019, 12:47 PM

## 2019-08-24 NOTE — Telephone Encounter (Signed)
Wife informed. Neurology appt is scheduled for 09/20/19

## 2019-08-24 NOTE — Telephone Encounter (Signed)
Pts wife wants to know if pt should be on any blood thinners?

## 2019-08-24 NOTE — Telephone Encounter (Signed)
Is he on my schedule to call, does he want a phone appointment?  If not I would wait till the neurology appointment

## 2019-08-24 NOTE — Telephone Encounter (Signed)
Pt is not on the schedule to see you until November. The wife did not suggest an appt with you

## 2019-09-10 ENCOUNTER — Other Ambulatory Visit: Payer: Self-pay

## 2019-09-10 ENCOUNTER — Ambulatory Visit (INDEPENDENT_AMBULATORY_CARE_PROVIDER_SITE_OTHER): Payer: Medicare Other | Admitting: *Deleted

## 2019-09-10 DIAGNOSIS — Z0000 Encounter for general adult medical examination without abnormal findings: Secondary | ICD-10-CM | POA: Diagnosis not present

## 2019-09-10 NOTE — Progress Notes (Signed)
MEDICARE ANNUAL WELLNESS VISIT  09/10/2019  Telephone Visit Disclaimer This Medicare AWV was conducted by telephone due to national recommendations for restrictions regarding the COVID-19 Pandemic (e.g. social distancing).  I verified, using two identifiers, that I am speaking with Todd Berg or their authorized healthcare agent. I discussed the limitations, risks, security, and privacy concerns of performing an evaluation and management service by telephone and the potential availability of an in-person appointment in the future. The patient expressed understanding and agreed to proceed.   Subjective:  Todd Berg is a 69 y.o. male patient of Dettinger, [name], MD who had a Medicare Annual Wellness Visit today via telephone. [name] is Retired and lives with their spouse. he has 5 children. he reports that he is socially active and does interact with friends/family regularly. he is minimally physically active and enjoys reading his Bible, yard work and being outside.  Patient Care Team: Dettinger, [name], MD as PCP - General (Family Medicine) Frann Rider, NP as Nurse Practitioner (Nurse Practitioner)  Advanced Directives 09/10/2019 12/06/2018 11/19/2018 10/31/2018 10/04/2018 09/07/2018 01/27/2018  Does Patient Have a Medical Advance Directive? - No No No No No No  Would patient like information on creating a medical advance directive? No - Patient declined Yes (ED - Information included in AVS) - - Yes (ED - Information included in AVS) Yes (MAU/Ambulatory/Procedural Areas - Information given) No - Patient declined    Hospital Utilization Over the Past 12 Months: # of hospitalizations or ER visits: 4 # of surgeries: 0  Review of Systems    Patient reports that his overall health is worse compared to last year.  History obtained from chart review  Patient Reported Readings (BP, Pulse, CBG, Weight, etc) none  Pain Assessment Pain : No/denies pain     Current  Medications & Allergies (verified) Allergies as of 09/10/2019   No Known Allergies     Medication List       Accurate as of September 10, 2019  8:55 AM. If you have any questions, ask your nurse or doctor.        amLODipine 2.5 MG tablet Commonly known as: NORVASC Take 1 tablet (2.5 mg total) by mouth daily.   cetirizine 10 MG tablet Commonly known as: ZYRTEC Take 1 tablet (10 mg total) by mouth daily. For itching   pantoprazole 40 MG tablet Commonly known as: PROTONIX Take 1 tablet (40 mg total) by mouth 2 (two) times daily before a meal.   Vitamin B-12 5000 MCG Subl DISSOLVE 1 TABLET UNDER TONGUE DAILY       History (reviewed): Past Medical History:  Diagnosis Date  . Cancer ([place]) 2015   rectal  . Chronic back pain   . COPD (chronic obstructive pulmonary disease) ([place])   . GERD (gastroesophageal reflux disease)   . Hematemesis   . Hemorrhoids   . Hyperlipidemia   . Hypotension   . Melena   . Melena 10/12/2017  . Stroke [facility]) 2019   Past Surgical History:  Procedure Laterality Date  . BACK SURGERY    . BIOPSY  10/13/2017   Procedure: BIOPSY;  Surgeon: Laurence Spates, MD;  Location: [facility];  Service: Endoscopy;;  . BIOPSY  11/20/2018   Procedure: BIOPSY;  Surgeon: Daneil Dolin, MD;  [facility];  Service: Endoscopy;;  . COLONOSCOPY  08/2015   Dr. [name]: normal  . ESOPHAGEAL DILATION  11/20/2018   Procedure: ESOPHAGEAL DILATION;  Surgeon: Daneil Dolin, MD;  [facility];  Service: Endoscopy;;  . ESOPHAGOGASTRODUODENOSCOPY N/A 06/09/2016   Dr. Wilford Corner: LA Grade A reflux esophagitis, non-bleeding gastric ulcers s/p epi. Large amount of old blood in dependent portions of stomach. H.pylori serology: negative.   . ESOPHAGOGASTRODUODENOSCOPY (EGD) WITH PROPOFOL Left 11/21/2016   Dr. Paulita Fujita: small hiatal hernia, benign-appearing esophageal stenosis, non-bleeding gastric ulcer without stigmata of bleeding. 6 mm in largest  dimension. Gastritis. Normal duodenum. Felt to be NSAID-induced.   . ESOPHAGOGASTRODUODENOSCOPY (EGD) WITH PROPOFOL N/A 10/13/2017   Dr. Oletta Lamas: large hiatal hernia, one cratered gastric ulcer in preplyoric region of stomach, 8 mm in largest dimension s/p biopsy. Negative H.pylori  . ESOPHAGOGASTRODUODENOSCOPY (EGD) WITH PROPOFOL N/A 11/20/2018   Procedure: ESOPHAGOGASTRODUODENOSCOPY (EGD) WITH PROPOFOL;  Surgeon: Daneil Dolin, MD;  [facility];  Service: Endoscopy;  Laterality: N/A;  . HEMORRHOID SURGERY    . HERNIA REPAIR    . RECTAL SURGERY     Family History  Problem Relation Age of Onset  . COPD Mother   . Obesity Mother   . Stroke Father    Social History   Socioeconomic History  . Marital status: Married    Spouse name: [name]  . Number of children: 5  . Years of education: 8  . Highest education level: 8th grade  Occupational History  . Occupation: Retired  Tobacco Use  . Smoking status: Former Smoker    Start date: 12/30/1964    Quit date: 06/19/2009    Years since quitting: 10.2  . Smokeless tobacco: Former Systems developer    Types: Snuff    Quit date: 09/26/2005  Vaping Use  . Vaping Use: Never used  Substance and Sexual Activity  . Alcohol use: No  . Drug use: No  . Sexual activity: Not Currently  Other Topics Concern  . Not on file  Social History Narrative   Married, 5 children, several grandchildren   Social Determinants of Health   Financial Resource Strain: Low Risk   . Difficulty of Paying Living Expenses: Not hard at all  Food Insecurity: No Food Insecurity  . Worried About Charity fundraiser in the Last Year: Never true  . Ran Out of Food in the Last Year: Never true  Transportation Needs: No Transportation Needs  . Lack of Transportation (Medical): No  . Lack of Transportation (Non-Medical): No  Physical Activity: Inactive  . Days of Exercise per Week: 0 days  . Minutes of Exercise per Session: 0 min  Stress: No Stress Concern Present  .  Feeling of Stress : Not at all  Social Connections: Socially Integrated  . Frequency of Communication with Friends and Family: More than three times a week  . Frequency of Social Gatherings with Friends and Family: More than three times a week  . Attends Religious Services: More than 4 times per year  . Active Member of Clubs or Organizations: Yes  . Attends Archivist Meetings: More than 4 times per year  . Marital Status: Married    Activities of Daily Living In your present state of health, do you have any difficulty performing the following activities: 09/10/2019 11/19/2018  Hearing? N -  Vision? N -  Comment wears rx glasses-just had eye exam -  Difficulty concentrating or making decisions? Y -  Comment memory impaired -  Walking or climbing stairs? N -  Dressing or bathing? N -  Doing errands, shopping? N N  Preparing Food and eating ? N -  Using  the Toilet? N -  In the past six months, have you accidently leaked urine? N -  Do you have problems with loss of bowel control? N -  Managing your Medications? Y -  Comment wife reminds him to take his medications -  Managing your Finances? Y -  Comment wife manages all the finances -  Housekeeping or managing your Housekeeping? N -  Some recent data might be hidden    Patient Education/ Literacy How often do you need to have someone help you when you read instructions, pamphlets, or other written materials from your doctor or pharmacy?: 1 - Never What is the last grade level you completed in school?: 8th grade  Exercise Current Exercise Habits: The patient does not participate in regular exercise at present, Exercise limited by: neurologic condition(s);orthopedic condition(s);respiratory conditions(s)  Diet Patient reports consuming 2 meals a day and 2 snack(s) a day Patient reports that his primary diet is: Regular Patient reports that she does have regular access to food.   Depression Screen PHQ 2/9 Scores  09/10/2019 07/23/2019 07/10/2019 07/06/2019 06/05/2019 12/25/2018 11/02/2018  PHQ - 2 Score 0 0 0 0 0 0 0     Fall Risk Fall Risk  09/10/2019 07/23/2019 07/10/2019 07/06/2019 06/05/2019  Falls in the past year? 0 0 0 0 0  Number falls in past yr: - - - 0 -  Injury with Fall? - - - 0 -  Risk for fall due to : - - - No Fall Risks -  Follow up - - - Falls evaluation completed -     Objective:  Todd Berg seemed alert and oriented and he participated appropriately during our telephone visit.  Blood Pressure Weight BMI  BP Readings from Last 3 Encounters:  07/23/19 128/75  07/10/19 139/82  07/06/19 127/80   Wt Readings from Last 3 Encounters:  07/23/19 209 lb (94.8 kg)  07/10/19 211 lb 6.4 oz (95.9 kg)  07/06/19 216 lb (98 kg)   BMI Readings from Last 1 Encounters:  07/23/19 27.57 kg/m    *Unable to obtain current vital signs, weight, and BMI due to telephone visit type  Hearing/Vision  . Ca[place]an did not seem to have difficulty with hearing/understanding during the telephone conversation . Reports that he has had a formal eye exam by an eye care professional within the past year . Reports that he has not had a formal hearing evaluation within the past year *Unable to fully assess hearing and vision during telephone visit type  Cognitive Function: 6CIT Screen 09/07/2018 09/06/2017  What Year? 0 points 0 points  What month? 0 points 0 points  What time? 0 points 0 points  Count back from 20 0 points 0 points  Months in reverse 0 points 0 points  Repeat phrase 0 points 0 points  Total Score 0 0   (Normal:0-7, Significant for Dysfunction: >8)  Normal Cognitive Function Screening: No: pt declined screening over the phone today   Immunization & Health Maintenance Record Immunization History  Administered Date(s) Administered  . Fluad Quad(high Dose 65+) 10/12/2018  . Influenza, High Dose Seasonal PF 10/13/2017  . Pneumococcal Polysaccharide-23 10/13/2017  . Rabies, IM  09/14/2000, 10/11/2000, 10/18/2000, 11/02/2000    Health Maintenance  Topic Date Due  . INFLUENZA VACCINE  08/12/2019  . TETANUS/TDAP  09/13/2019 (Originally 12/31/1969)  . Hepatitis C Screening  10/12/2019 (Originally October 20, 1950)  . PNA vac Low Risk Adult (2 of 2 - PCV13) 01/14/2020 (Originally 10/14/2018)  . COLONOSCOPY  08/11/2025  .  COVID-19 Vaccine  Discontinued       Assessment  This is a routine wellness examination for Todd Berg.  Health Maintenance: Due or Overdue Health Maintenance Due  Topic Date Due  . INFLUENZA VACCINE  08/12/2019    Todd Berg does not need a referral for Community Assistance: Care Management:   no Social Work:    no Prescription Assistance:  no Nutrition/Diabetes Education:  no   Plan:  Personalized Goals Goals Addressed            This Visit's Progress   . Prevent falls        Personalized Health Maintenance & Screening Recommendations  Pneumococcal vaccine  Influenza vaccine Td vaccine Shingrix vaccine  COVID vaccine   Lung Cancer Screening Recommended: no (Low Dose CT Chest recommended if Age 57-80 years, 30 pack-year currently smoking OR have quit w/in past 15 years) Hepatitis C Screening recommended: no HIV Screening recommended: no  Advanced Directives: Written information was not prepared per patient's request.  Referrals & Orders No orders of the defined types were placed in this encounter.   Follow-up Plan . Follow-up with Dettinger, [name], MD as planned . Keep all appointments with your Specialists . Consider Flu, Prevnar 13, TDAP, Shingrix and COVID vaccines at your next visit with your PCP   I have personally reviewed and noted the following in the patient's chart:   . Medical and social history . Use of alcohol, tobacco or illicit drugs  . Current medications and supplements . Functional ability and status . Nutritional status . Physical activity . Advanced directives . List of other  physicians . Hospitalizations, surgeries, and ER visits in previous 12 months . Vitals . Screenings to include cognitive, depression, and falls . Referrals and appointments  In addition, I have reviewed and discussed with Todd Berg certain preventive protocols, quality metrics, and best practice recommendations. A written personalized care plan for preventive services as well as general preventive health recommendations is available and can be mailed to the patient at his request.      Milas Hock, LPN  [phone]

## 2019-09-10 NOTE — Patient Instructions (Signed)

## 2019-09-14 ENCOUNTER — Other Ambulatory Visit: Payer: Self-pay

## 2019-09-14 ENCOUNTER — Encounter (HOSPITAL_COMMUNITY): Payer: Self-pay

## 2019-09-14 DIAGNOSIS — J449 Chronic obstructive pulmonary disease, unspecified: Secondary | ICD-10-CM | POA: Insufficient documentation

## 2019-09-14 DIAGNOSIS — R109 Unspecified abdominal pain: Secondary | ICD-10-CM | POA: Diagnosis not present

## 2019-09-14 DIAGNOSIS — I10 Essential (primary) hypertension: Secondary | ICD-10-CM | POA: Diagnosis not present

## 2019-09-14 DIAGNOSIS — Z79899 Other long term (current) drug therapy: Secondary | ICD-10-CM | POA: Diagnosis not present

## 2019-09-14 DIAGNOSIS — Z87891 Personal history of nicotine dependence: Secondary | ICD-10-CM | POA: Insufficient documentation

## 2019-09-14 DIAGNOSIS — R195 Other fecal abnormalities: Secondary | ICD-10-CM | POA: Diagnosis not present

## 2019-09-14 DIAGNOSIS — K921 Melena: Secondary | ICD-10-CM | POA: Diagnosis not present

## 2019-09-14 DIAGNOSIS — Z85048 Personal history of other malignant neoplasm of rectum, rectosigmoid junction, and anus: Secondary | ICD-10-CM | POA: Insufficient documentation

## 2019-09-14 LAB — CBC
HCT: 44.9 % (ref 39.0–52.0)
Hemoglobin: 14.5 g/dL (ref 13.0–17.0)
MCH: 29.1 pg (ref 26.0–34.0)
MCHC: 32.3 g/dL (ref 30.0–36.0)
MCV: 90 fL (ref 80.0–100.0)
Platelets: 225 10*3/uL (ref 150–400)
RBC: 4.99 MIL/uL (ref 4.22–5.81)
RDW: 13.2 % (ref 11.5–15.5)
WBC: 3.3 10*3/uL — ABNORMAL LOW (ref 4.0–10.5)
nRBC: 0 % (ref 0.0–0.2)

## 2019-09-14 LAB — COMPREHENSIVE METABOLIC PANEL
ALT: 14 U/L (ref 0–44)
AST: 19 U/L (ref 15–41)
Albumin: 3.7 g/dL (ref 3.5–5.0)
Alkaline Phosphatase: 74 U/L (ref 38–126)
Anion gap: 12 (ref 5–15)
BUN: 15 mg/dL (ref 8–23)
CO2: 26 mmol/L (ref 22–32)
Calcium: 9 mg/dL (ref 8.9–10.3)
Chloride: 100 mmol/L (ref 98–111)
Creatinine, Ser: 0.9 mg/dL (ref 0.61–1.24)
GFR calc Af Amer: >60 mL/min (ref >60)
GFR calc non Af Amer: >60 mL/min (ref >60)
Glucose, Bld: 99 mg/dL (ref 70–99)
Potassium: 3 mmol/L — ABNORMAL LOW (ref 3.5–5.1)
Sodium: 138 mmol/L (ref 135–145)
Total Bilirubin: 0.8 mg/dL (ref 0.3–1.2)
Total Protein: 7.7 g/dL (ref 6.5–8.1)

## 2019-09-14 LAB — LIPASE, BLOOD: Lipase: 30 U/L (ref 11–51)

## 2019-09-14 NOTE — ED Notes (Signed)
Patient had labs drawn and is waiting for a room in his car Number is [phone]

## 2019-09-14 NOTE — ED Triage Notes (Signed)
Pt to er, pt states that he had a stroke about a year ago and now can't remember things. Pt states that he is here for some abd pain that he has had for a long time. Pt states that it is a constant pain.

## 2019-09-15 ENCOUNTER — Emergency Department (HOSPITAL_COMMUNITY)
Admission: EM | Admit: 2019-09-15 | Discharge: 2019-09-15 | Disposition: A | Discharge status: Home / Self Care | Payer: Medicare Other | Attending: Emergency Medicine | Admitting: Emergency Medicine

## 2019-09-15 ENCOUNTER — Other Ambulatory Visit: Payer: Self-pay

## 2019-09-15 ENCOUNTER — Emergency Department (HOSPITAL_COMMUNITY)
Admission: EM | Admit: 2019-09-15 | Discharge: 2019-09-16 | Disposition: A | Discharge status: Home / Self Care | Payer: Medicare Other | Source: Home / Self Care | Attending: Emergency Medicine | Admitting: Emergency Medicine

## 2019-09-15 ENCOUNTER — Encounter (HOSPITAL_COMMUNITY): Payer: Self-pay | Admitting: Emergency Medicine

## 2019-09-15 DIAGNOSIS — R195 Other fecal abnormalities: Secondary | ICD-10-CM | POA: Diagnosis not present

## 2019-09-15 DIAGNOSIS — K921 Melena: Secondary | ICD-10-CM

## 2019-09-15 DIAGNOSIS — Z85048 Personal history of other malignant neoplasm of rectum, rectosigmoid junction, and anus: Secondary | ICD-10-CM | POA: Insufficient documentation

## 2019-09-15 DIAGNOSIS — J449 Chronic obstructive pulmonary disease, unspecified: Secondary | ICD-10-CM | POA: Insufficient documentation

## 2019-09-15 DIAGNOSIS — Z87891 Personal history of nicotine dependence: Secondary | ICD-10-CM | POA: Insufficient documentation

## 2019-09-15 DIAGNOSIS — I10 Essential (primary) hypertension: Secondary | ICD-10-CM | POA: Insufficient documentation

## 2019-09-15 DIAGNOSIS — Z79899 Other long term (current) drug therapy: Secondary | ICD-10-CM | POA: Insufficient documentation

## 2019-09-15 LAB — COMPREHENSIVE METABOLIC PANEL
ALT: 15 U/L (ref 0–44)
AST: 19 U/L (ref 15–41)
Albumin: 3.3 g/dL — ABNORMAL LOW (ref 3.5–5.0)
Alkaline Phosphatase: 69 U/L (ref 38–126)
Anion gap: 12 (ref 5–15)
BUN: 13 mg/dL (ref 8–23)
CO2: 25 mmol/L (ref 22–32)
Calcium: 8.6 mg/dL — ABNORMAL LOW (ref 8.9–10.3)
Chloride: 99 mmol/L (ref 98–111)
Creatinine, Ser: 0.98 mg/dL (ref 0.61–1.24)
GFR calc Af Amer: >60 mL/min (ref >60)
GFR calc non Af Amer: >60 mL/min (ref >60)
Glucose, Bld: 100 mg/dL — ABNORMAL HIGH (ref 70–99)
Potassium: 3.2 mmol/L — ABNORMAL LOW (ref 3.5–5.1)
Sodium: 136 mmol/L (ref 135–145)
Total Bilirubin: 1 mg/dL (ref 0.3–1.2)
Total Protein: 6.8 g/dL (ref 6.5–8.1)

## 2019-09-15 LAB — CBC
HCT: 41.8 % (ref 39.0–52.0)
Hemoglobin: 13.2 g/dL (ref 13.0–17.0)
MCH: 28 pg (ref 26.0–34.0)
MCHC: 31.6 g/dL (ref 30.0–36.0)
MCV: 88.6 fL (ref 80.0–100.0)
Platelets: 222 10*3/uL (ref 150–400)
RBC: 4.72 MIL/uL (ref 4.22–5.81)
RDW: 13 % (ref 11.5–15.5)
WBC: 3.2 10*3/uL — ABNORMAL LOW (ref 4.0–10.5)
nRBC: 0 % (ref 0.0–0.2)

## 2019-09-15 LAB — TYPE AND SCREEN
ABO/RH(D): O NEG
Antibody Screen: NEGATIVE

## 2019-09-15 NOTE — ED Notes (Signed)
Attempted to call family to see if pt is at baseline. No answer

## 2019-09-15 NOTE — ED Triage Notes (Signed)
Pt presents to ED POv. Pt c/o GI bleeding x1W. Pt reports that he has had dark black stool. Pt states he had loss of control of bowels. Denies weakness. Disoriented to time

## 2019-09-16 LAB — HEMOGLOBIN AND HEMATOCRIT, BLOOD
HCT: 42.2 % (ref 39.0–52.0)
Hemoglobin: 13.7 g/dL (ref 13.0–17.0)

## 2019-09-16 MED ORDER — POTASSIUM CHLORIDE CRYS ER 20 MEQ PO TBCR
40.0000 meq | EXTENDED_RELEASE_TABLET | Freq: Once | ORAL | Status: AC
  Administered 2019-09-16: 40 meq via ORAL
  Filled 2019-09-16: qty 2

## 2019-09-16 MED ORDER — PANTOPRAZOLE SODIUM 40 MG IV SOLR
40.0000 mg | Freq: Once | INTRAVENOUS | Status: AC
  Administered 2019-09-16: 40 mg via INTRAVENOUS
  Filled 2019-09-16: qty 40

## 2019-09-16 NOTE — Discharge Instructions (Addendum)
Todd Berg, it was great to meet you. Your labs and tests in hospital show you do not have a bleed in the gut. Please continue to take the Protonix as this will prevent bleeding in the gut.  Please follow up with your primary care this week for a follow up appointment.   If you have persistent black stools, rectal bleeding, vomiting, dizziness, chest pain etc then please come back to the ED immediately.

## 2019-09-16 NOTE — Care Management (Signed)
Texted mobile phone that was listed for wife [name].   In that text told her that we had called and left massages several tomes and want to ,make sure she was alright., as well as aware of the patients discharge.

## 2019-09-16 NOTE — ED Notes (Signed)
No answer from family members called yet, will keep trying.

## 2019-09-16 NOTE — ED Provider Notes (Signed)
[facility] EMERGENCY DEPARTMENT Provider Note   CSN: [id number] Arrival date & time: 09/15/19  2005     History Chief Complaint  Patient presents with  . GI Bleeding    Todd Berg is a 69 y.o. male.  Todd Berg is a 69 yr old male who presents to the ED for melena for the last 3-4 week hx of melena. He describes the melena "dark as it can be". Having 2-3 times episodes a day. Denies abdo pain, nausea, vomiting, chest pain, dizziness, dyspnea or cough. Unable to give me a further history. He said his wife would be able to tell me more. Tried to contact wife on 3 numbers provided on Epic however no answer.           Past Medical History:  Diagnosis Date  . Cancer ([place]) 2015   rectal  . Chronic back pain   . COPD (chronic obstructive pulmonary disease) ([place])   . GERD (gastroesophageal reflux disease)   . Hematemesis   . Hemorrhoids   . Hyperlipidemia   . Hypotension   . Melena   . Melena 10/12/2017  . Stroke [facility]) 2019    Patient Active Problem List   Diagnosis Date Noted  . Memory changes 11/30/2018  . H/O: CVA (cerebrovascular accident) 11/20/2018  . GI bleed 11/20/2018  . Upper GI bleed   . Melena 11/19/2018  . Infestation by bed bug 11/14/2018  . Gallstones 11/14/2018  . Vitamin B12 deficiency 08/03/2018  . Numbness and tingling of both legs 08/03/2018  . Anemia 10/10/2017  . Fatigue 10/10/2017  . Hypertension 03/21/2017  . Generalized anxiety disorder 03/21/2017  . Degenerative disc disease, lumbar 03/17/2017  . Iron deficiency anemia due to chronic blood loss 11/20/2016  . Chronic back pain   . COPD (chronic obstructive pulmonary disease) ([place])   . GERD (gastroesophageal reflux disease)   . History of rectal cancer 09/13/2015    Past Surgical History:  Procedure Laterality Date  . BACK SURGERY    . BIOPSY  10/13/2017   Procedure: BIOPSY;  Surgeon: Laurence Spates, MD;  Location: [facility] ENDOSCOPY;  Service: Endoscopy;;  .  BIOPSY  11/20/2018   Procedure: BIOPSY;  Surgeon: Daneil Dolin, MD;  [facility];  Service: Endoscopy;;  . COLONOSCOPY  08/2015   Dr. [name]: normal  . ESOPHAGEAL DILATION  11/20/2018   Procedure: ESOPHAGEAL DILATION;  Surgeon: Daneil Dolin, MD;  [facility];  Service: Endoscopy;;  . ESOPHAGOGASTRODUODENOSCOPY N/A 06/09/2016   Dr. Wilford Corner: LA Grade A reflux esophagitis, non-bleeding gastric ulcers s/p epi. Large amount of old blood in dependent portions of stomach. H.pylori serology: negative.   . ESOPHAGOGASTRODUODENOSCOPY (EGD) WITH PROPOFOL Left 11/21/2016   Dr. Paulita Fujita: small hiatal hernia, benign-appearing esophageal stenosis, non-bleeding gastric ulcer without stigmata of bleeding. 6 mm in largest dimension. Gastritis. Normal duodenum. Felt to be NSAID-induced.   . ESOPHAGOGASTRODUODENOSCOPY (EGD) WITH PROPOFOL N/A 10/13/2017   Dr. Oletta Lamas: large hiatal hernia, one cratered gastric ulcer in preplyoric region of stomach, 8 mm in largest dimension s/p biopsy. Negative H.pylori  . ESOPHAGOGASTRODUODENOSCOPY (EGD) WITH PROPOFOL N/A 11/20/2018   Procedure: ESOPHAGOGASTRODUODENOSCOPY (EGD) WITH PROPOFOL;  Surgeon: Daneil Dolin, MD;  [facility];  Service: Endoscopy;  Laterality: N/A;  . HEMORRHOID SURGERY    . HERNIA REPAIR    . RECTAL SURGERY         Family History  Problem Relation Age of Onset  . COPD Mother   .  Obesity Mother   . Stroke Father     Social History   Tobacco Use  . Smoking status: Former Smoker    Start date: 12/30/1964    Quit date: 06/19/2009    Years since quitting: 10.2  . Smokeless tobacco: Former Systems developer    Types: Snuff    Quit date: 09/26/2005  Vaping Use  . Vaping Use: Never used  Substance Use Topics  . Alcohol use: No  . Drug use: No    Home Medications Prior to Admission medications   Medication Sig Start Date End Date Taking? Authorizing Provider  amLODipine (NORVASC) 2.5 MG tablet Take 1 tablet  (2.5 mg total) by mouth daily. 05/04/18   Dettinger, [name], MD  cetirizine (ZYRTEC) 10 MG tablet Take 1 tablet (10 mg total) by mouth daily. For itching 06/05/19   Hendricks Limes F, FNP  Cyanocobalamin (VITAMIN B-12) 5000 MCG SUBL DISSOLVE 1 TABLET UNDER TONGUE DAILY 01/24/19   Dettinger, [name], MD  pantoprazole (PROTONIX) 40 MG tablet Take 1 tablet (40 mg total) by mouth 2 (two) times daily before a meal. 06/05/19   [name], FNP    Allergies    Patient has no known allergies.  Review of Systems   Review of Systems  Physical Exam Updated Vital Signs BP (!) 159/82   Pulse (!) 57   Temp 98.9 F (37.2 C) (Oral)   Resp 16   SpO2 97%   Physical Exam Constitutional:      General: He is not in acute distress.    Appearance: Normal appearance. He is not ill-appearing or toxic-appearing.  HENT:     Head: Normocephalic and atraumatic.     Nose: No congestion or rhinorrhea.     Mouth/Throat:     Mouth: Mucous membranes are moist.     Pharynx: Oropharynx is clear.  Eyes:     Extraocular Movements: Extraocular movements intact.     Pupils: Pupils are equal, round, and reactive to light.  Cardiovascular:     Rate and Rhythm: Normal rate and regular rhythm.     Pulses: Normal pulses.  Pulmonary:     Effort: Pulmonary effort is normal.  Abdominal:     General: Abdomen is flat. Bowel sounds are normal.     Palpations: Abdomen is soft.     Comments: Rectal exam chaperoned by RN Arby Barrette. No fissues, hemorrhoids.Tenderness on exam. Unable to determine anal tone. No blood or melena on glove.   Musculoskeletal:     Cervical back: Normal range of motion and neck supple.  Skin:    General: Skin is warm and dry.  Neurological:     Mental Status: He is alert. He is disoriented.     Comments: ANO x1     ED Results / Procedures / Treatments   Labs (all labs ordered are listed, but only abnormal results are displayed) Labs Reviewed  COMPREHENSIVE METABOLIC PANEL - Abnormal;  Notable for the following components:      Result Value   Potassium 3.2 (*)    Glucose, Bld 100 (*)    Calcium 8.6 (*)    Albumin 3.3 (*)    All other components within normal limits  CBC - Abnormal; Notable for the following components:   WBC 3.2 (*)    All other components within normal limits  HEMOGLOBIN AND HEMATOCRIT, BLOOD  OCCULT BLOOD X 1 CARD TO LAB, STOOL  POC OCCULT BLOOD, ED  TYPE AND SCREEN    EKG None  Radiology No results found.  Procedures Procedures (including critical care time)  Medications Ordered in ED Medications  potassium chloride SA (KLOR-CON) CR tablet 40 mEq (has no administration in time range)  pantoprazole (PROTONIX) injection 40 mg (40 mg Intravenous Given 09/16/19 1025)    ED Course  I have reviewed the triage vital signs and the nursing notes.  Pertinent labs & imaging results that were available during my care of the patient were reviewed by me and considered in my medical decision making (see chart for details).    MDM Rules/Calculators/A&P                          Valton Schwartz is a 69 yr old male with PMH rectal cancer, stroke and previous GI bleeds presents today for 3-4 week history of melena. Pt is ANO X 1 and unable to give clear history. Attempted to call wife several times for history but no answerLabs: Hb 13.2, K 3.2, Glucose 100, calcium 8.6. Examination is unremarkable today. Rectal exam without  Blood/melena. FOBT negative. Repeat H&H 13.7  GI bleed unlikely. Pt with normal vital signs. No episodes of melena in the ED> Discharged with written safety precautions which pt can show his wife. Pt should follow up with PCP this week.     Final Clinical Impression(s) / ED Diagnoses Final diagnoses:  Black stools   Rx / DC Orders ED Discharge Orders    None       Lattie Haw, MD 09/16/19 1121    Blanchie Dessert, MD 09/16/19 1130

## 2019-09-16 NOTE — ED Notes (Signed)
Spoke with pt's daughter Peri Maris and was informed to update pt contact with her number... [phone].

## 2019-09-16 NOTE — Care Management (Signed)
Synetta Fail, patients wife has not returned any phone calls or text messages. [place] PD for well check

## 2019-09-16 NOTE — ED Notes (Signed)
Family cannot be contacted. Pt has been roomed in a different area in the ED while waiting on his family to come get him. This RN will keep trying to call his family.

## 2019-09-20 ENCOUNTER — Ambulatory Visit (INDEPENDENT_AMBULATORY_CARE_PROVIDER_SITE_OTHER): Payer: Medicare Other | Admitting: Neurology

## 2019-09-20 ENCOUNTER — Telehealth: Payer: Self-pay | Admitting: Neurology

## 2019-09-20 ENCOUNTER — Other Ambulatory Visit: Payer: Self-pay

## 2019-09-20 ENCOUNTER — Encounter: Payer: Self-pay | Admitting: Neurology

## 2019-09-20 VITALS — BP 121/76 | HR 80 | Ht 70.0 in | Wt 203.0 lb

## 2019-09-20 DIAGNOSIS — G301 Alzheimer's disease with late onset: Secondary | ICD-10-CM | POA: Diagnosis not present

## 2019-09-20 DIAGNOSIS — R413 Other amnesia: Secondary | ICD-10-CM | POA: Diagnosis not present

## 2019-09-20 DIAGNOSIS — I6990 Unspecified sequelae of unspecified cerebrovascular disease: Secondary | ICD-10-CM

## 2019-09-20 DIAGNOSIS — F0280 Dementia in other diseases classified elsewhere without behavioral disturbance: Secondary | ICD-10-CM

## 2019-09-20 MED ORDER — MEMANTINE HCL 28 X 5 MG & 21 X 10 MG PO TABS: ORAL_TABLET | ORAL | 12 refills | Status: DC

## 2019-09-20 NOTE — Patient Instructions (Signed)
I had a long discussion the patient and his daughter regarding his subacute memory and cognitive decline which likely represents Alzheimer's dementia.  I recommend further evaluation by checking lab work for reversible causes as well as EEG and CT angiogram of the brain and neck since he has had a new interval stroke on his recent MRI.  Trial of Namenda starter pack to be increased as tolerated for cognitive improvement.  We also discussed memory compensation strategies.  Continue aspirin for stroke prevention with aggressive risk factor modification strict control of hypertension with blood pressure goal below 130/90, lipids with LDL cholesterol goal below 70 mg percent and diabetes with hemoglobin A1c goal below 6.5%.  He will return for follow-up in the future in 2 months or call earlier if necessary. Memory Compensation Strategies  1. Use "WARM" strategy.  W= write it down  A= associate it  R= repeat it  M= make a mental note  2.   You can keep a Social worker.  Use a 3-ring notebook with sections for the following: calendar, important names and phone numbers,  medications, doctors' names/phone numbers, lists/reminders, and a section to journal what you did  each day.   3.    Use a calendar to write appointments down.  4.    Write yourself a schedule for the day.  This can be placed on the calendar or in a separate section of the Memory Notebook.  Keeping a  regular schedule can help memory.  5.    Use medication organizer with sections for each day or morning/evening pills.  You may need help loading it  6.    Keep a basket, or pegboard by the door.  Place items that you need to take out with you in the basket or on the pegboard.  You may also want to  include a message board for reminders.  7.    Use sticky notes.  Place sticky notes with reminders in a place where the task is performed.  For example: " turn off the  stove" placed by the stove, "lock the door" placed on the door at  eye level, " take your medications" on  the bathroom mirror or by the place where you normally take your medications.  8.    Use alarms/timers.  Use while cooking to remind yourself to check on food or as a reminder to take your medicine, or as a  reminder to make a call, or as a reminder to perform another task, etc. Yes.  Rocephin Alzheimer Disease Caregiver Guide  Alzheimer disease causes a person to lose the ability to remember things and make decisions. A person who has Alzheimer disease may not be able to take care of himself or herself. He or she may need help with simple tasks. The tips below can help you care for the person. What kind of changes does this condition cause? This condition makes a person:  Forget things.  Feel confused.  Act differently.  Have different moods. These things get worse with time. Tips to help with symptoms  Be calm and patient.  Respond with a simple, short answer.  Avoid correcting the person in a negative way.  Try not to take things personally, even if the person forgets your name.  Do not argue with the person. This may make the person more upset. Tips to lessen frustration  Make appointments and do daily tasks when the person is at his or her best.  Take your time.  Simple tasks may take longer. Allow plenty of time to complete tasks.  Limit choices for the person.  Involve the person in what you are doing.  Keep a daily routine.  Avoid new or crowded places, if possible.  Use simple words, short sentences, and a calm voice. Only give one direction at a time.  Buy clothes and shoes that are easy to put on and take off.  Organize medicines in a pillbox for each day of the week.  Keep a calendar in a central location to remind the person of meetings or other activities.  Let people help if they offer. Take a break when needed. Tips to prevent injury  Keep floors clear. Remove rugs, magazine racks, and floor lamps.  Keep  hallways well-lit.  Put a handrail and non-slip mat in the bathtub or shower.  Put childproof locks on cabinets that have dangerous items in them. These items include medicine, alcohol, guns, toxic cleaning items, sharp tools, matches, and lighters.  Put locks on doors where the person cannot see or reach them. This helps the person to not wander out of the house and get lost.  Be prepared for emergencies. Keep a list of emergency phone numbers and addresses close by.  Bracelets may be worn that track location and identify the person as having memory problems. This should be worn at all times for safety. Tips for the future  Discuss financial and legal planning early. People with this disease have trouble managing their money as the disease gets worse. Get help from a professional.  Talk about advance directives, safety, and daily care. Take these steps: ? Create a living will and choose a power of attorney. This is someone who can make decisions for the person with Alzheimer disease when he or she can no longer do so. ? Discuss driving safety and when to stop driving. The person's doctor can help with this. ? If the person lives alone, make sure he or she is safe. Some people need extra help at home. Other people need more care at a nursing home or care center. Where to find support You can find support by joining a support group near you. Some benefits of joining a support group include:  Learning ways to manage stress.  Sharing experiences with others.  Getting emotional comfort and support.  Learning about caregiving as the disease progresses.  Knowing what community resources are available and making use of them. Where to find more information  Alzheimer's Association: [employer] Contact a doctor if:  The person has a fever.  The person has a sudden behavior change that does not get better with calming strategies.  The person is not able to take care of himself or  herself at home.  The person threatens you or anyone else, including himself or herself.  You are no longer able to care for the person. Summary  Alzheimer disease causes a person to forget things and to be confused.  A person who has this condition may not be able to take care of himself or herself.  Take steps to keep the person from getting hurt. Plan for future care.  You can find support by joining a support group near you. This information is not intended to replace advice given to you by your health care provider. Make sure you discuss any questions you have with your health care provider. Document Revised: 04/18/2018 Document Reviewed: 12/23/2016 Elsevier Patient Education  2020 [employer].

## 2019-09-20 NOTE — Telephone Encounter (Signed)
Spoke to pt's daughter Harrell Lark over instructions for [employer]  All questions answered and voiced understanding

## 2019-09-20 NOTE — Telephone Encounter (Signed)
Pt's daughter, Peri Maris would like instructions clarification for memantine (NAMENDA TITRATION PAK) tablet pack. Would like a call back from the nurse.

## 2019-09-20 NOTE — Progress Notes (Signed)
[facility] [address] [place]. [place] [address] [phone]       OFFICE CONSULT NOTE  Mr. TRUTH BAROT Date of Birth:  [birth year] Medical Record Number:  [id number]   Referring MD: Vonna Kotyk Dettinger  Reason for Referral: Memory loss  HPI: Mr. Ramthun is a 69 year old Caucasian male seen today for office consultation visit for memory loss.  History is obtained from the patient and his daughter [name] was accompanying him as well as review of electronic medical records and I have reviewed personally available imaging films in PACS.  Mr. Ernsberger is a past medical history of rectal cancer, chronic back pain, hypertension, left thalamic lacunar infarct in May 2019 with no remaining residual deficits.  He has had cognitive decline he feels ever since his stroke 2 years ago.  This has been progressive in recent months seems to have impaired his functionality at home.  He gets quite forgetful even after being told several times recent information.  He gets agitated easily.  He can still remember things from the past well but recent incident seem difficult.  He has had not had any violent behavior and denies any delusions, hallucinations or unsafe behavior.  He is eats and sleeps well.  He can walk independently without assistance.  He has had no recurrent symptoms of new strokes.  He does have family history of dementia in his mother in her 29s.  Patient did have an outpatient MRI scan of the brain done on 08/27/2019 which I personally reviewed shows left thalamic infarcts of remote age and 2 lesions separated one of them was on a previous MRI from May 29, 2017 the other 1 appears to be in new but not recent.  There is diminished flow noted in the right vertebral artery.  Previously echocardiogram in May 2019 was unremarkable.  He has not had any recent dementia pending labs.  The family denies any history of witnessed seizure, head injury, loss of consciousness.  He denies history of  depression.  He apparently had Mini-Mental status done at the primary care physician's office recently which also showed 9/30.  ROS:   14 system review of systems is positive for memory loss, forgetfulness, getting lost,, disorientation, agitation and all other systems negative  PMH:  Past Medical History:  Diagnosis Date  . Cancer ([place]) 2015   rectal  . Chronic back pain   . COPD (chronic obstructive pulmonary disease) ([place])   . GERD (gastroesophageal reflux disease)   . Hematemesis   . Hemorrhoids   . Hyperlipidemia   . Hypotension   . Melena   . Melena 10/12/2017  . Stroke [facility]) 2019    Social History:  Social History   Socioeconomic History  . Marital status: Married    Spouse name: [name]  . Number of children: 5  . Years of education: 8  . Highest education level: 8th grade  Occupational History  . Occupation: Retired  Tobacco Use  . Smoking status: Former Smoker    Start date: 12/30/1964    Quit date: 06/19/2009    Years since quitting: 10.2  . Smokeless tobacco: Former Systems developer    Types: Snuff    Quit date: 09/26/2005  Vaping Use  . Vaping Use: Never used  Substance and Sexual Activity  . Alcohol use: No  . Drug use: No  . Sexual activity: Not Currently  Other Topics Concern  . Not on file  Social History Narrative   Married, 5 children, several grandchildren  Social Determinants of Health   Financial Resource Strain: Low Risk   . Difficulty of Paying Living Expenses: Not hard at all  Food Insecurity: No Food Insecurity  . Worried About Charity fundraiser in the Last Year: Never true  . Ran Out of Food in the Last Year: Never true  Transportation Needs: No Transportation Needs  . Lack of Transportation (Medical): No  . Lack of Transportation (Non-Medical): No  Physical Activity: Inactive  . Days of Exercise per Week: 0 days  . Minutes of Exercise per Session: 0 min  Stress: No Stress Concern Present  . Feeling of Stress : Not at all  Social  Connections: Socially Integrated  . Frequency of Communication with Friends and Family: More than three times a week  . Frequency of Social Gatherings with Friends and Family: More than three times a week  . Attends Religious Services: More than 4 times per year  . Active Member of Clubs or Organizations: Yes  . Attends Archivist Meetings: More than 4 times per year  . Marital Status: Married  Human resources officer Violence: Not At Risk  . Fear of Current or Ex-Partner: No  . Emotionally Abused: No  . Physically Abused: No  . Sexually Abused: No    Medications:   Current Outpatient Medications on File Prior to Visit  Medication Sig Dispense Refill  . amLODipine (NORVASC) 2.5 MG tablet Take 1 tablet (2.5 mg total) by mouth daily. 90 tablet 3  . cetirizine (ZYRTEC) 10 MG tablet Take 1 tablet (10 mg total) by mouth daily. For itching 90 tablet 0  . Cyanocobalamin (VITAMIN B-12) 5000 MCG SUBL DISSOLVE 1 TABLET UNDER TONGUE DAILY 110 tablet 0  . pantoprazole (PROTONIX) 40 MG tablet Take 1 tablet (40 mg total) by mouth 2 (two) times daily before a meal. 180 tablet 0   No current facility-administered medications on file prior to visit.    Allergies:  No Known Allergies  Physical Exam General: well developed, well nourished elderly Caucasian male, seated, in no evident distress Head: head normocephalic and atraumatic.   Neck: supple with no carotid or supraclavicular bruits Cardiovascular: regular rate and rhythm, no murmurs Musculoskeletal: no deformity Skin:  no rash/petichiae Vascular:  Normal pulses all extremities  Neurologic Exam Mental Status: Awake and fully alert. Oriented to place and time. Recent and remote memory poor t. Attention span, concentration and fund of knowledge diminished. Mood and affect appropriate.  Mini-Mental status exam score 9/30 with deficits in orientation, attention, calculation, following three-step commands and copying.  He was unable to copy  intersecting pentagons.  Clock drawing score was 1/4.  He was able to name only 7 animals which can walk on 4 legs.  Palmomental and grasp reflexes are absent.   Cranial Nerves: Fundoscopic exam reveals sharp disc margins. Pupils equal, briskly reactive to light. Extraocular movements full without nystagmus. Visual fields full to confrontation. Hearing intact. Facial sensation intact. Face, tongue, palate moves normally and symmetrically.  Motor: Normal bulk and tone. Normal strength in all tested extremity muscles.  Mild upper extremity action tremor left greater than right.  No cogwheel rigidity or pill-rolling. Sensory.: intact to touch , pinprick , position and vibratory sensation.  Coordination: Rapid alternating movements normal in all extremities. Finger-to-nose and heel-to-shin performed accurately bilaterally. Gait and Station: Arises from chair without difficulty. Stance is normal. Gait demonstrates mild ataxia with broad-based gait.  Unsteady on a narrow base and while standing on either foot unsupported.. Able to  heel, toe and tandem walk with moderate difficulty.  Reflexes: 1+ and symmetric. Toes downgoing.       ASSESSMENT: 69 year old Caucasian male with progressive memory and cognitive deterioration over the last 2 years following a small lacunar infarct likely due to Alzheimer's dementia.  History of left thalamic lacunar infarct in May 2019 and additional left thalamic infarct noted on subsequent MRI scan on 08/27/2019 clinically silent.  Vascular risk factors of hypertension and small vessel disease only     PLAN: I had a long discussion the patient and his daughter regarding his subacute memory and cognitive decline which likely represents Alzheimer's dementia.  I recommend further evaluation by checking lab work for reversible causes as well as EEG and CT angiogram of the brain and neck since he has had a new interval stroke on his recent MRI.  Trial of Namenda starter pack to be  increased as tolerated for cognitive improvement.  We also discussed memory compensation strategies.  Continue aspirin for stroke prevention with aggressive risk factor modification strict control of hypertension with blood pressure goal below 130/90, lipids with LDL cholesterol goal below 70 mg percent and diabetes with hemoglobin A1c goal below 6.5%.  Greater than 50% time during this 45-minute consultation visit was spent on counseling and coordination of care about his memory loss cognitive impairment and dementia and answering questions.  He will return for follow-up in the future in 2 months or call earlier if necessary. Antony Contras, MD Note: This document was prepared with digital dictation and possible smart phrase technology. Any transcriptional errors that result from this process are unintentional.

## 2019-09-20 NOTE — Telephone Encounter (Signed)
[employer] medicare order sent to GI. No auth they will reach out to the patient to schedule.  

## 2019-09-21 ENCOUNTER — Ambulatory Visit: Admit: 2019-09-21 | Discharge: 2019-10-01 | Disposition: A | Discharge status: Home Health | Payer: MEDICARE

## 2019-09-21 DIAGNOSIS — J1282 Pneumonia due to coronavirus disease 2019: Secondary | ICD-10-CM | POA: Diagnosis not present

## 2019-09-21 DIAGNOSIS — Z720 Tobacco use: Secondary | ICD-10-CM | POA: Diagnosis not present

## 2019-09-21 DIAGNOSIS — I10 Essential (primary) hypertension: Secondary | ICD-10-CM | POA: Diagnosis not present

## 2019-09-21 DIAGNOSIS — U071 COVID-19: Secondary | ICD-10-CM | POA: Diagnosis not present

## 2019-09-21 DIAGNOSIS — E876 Hypokalemia: Secondary | ICD-10-CM | POA: Diagnosis not present

## 2019-09-21 DIAGNOSIS — R0902 Hypoxemia: Secondary | ICD-10-CM | POA: Diagnosis not present

## 2019-09-21 DIAGNOSIS — Z8673 Personal history of transient ischemic attack (TIA), and cerebral infarction without residual deficits: Secondary | ICD-10-CM | POA: Diagnosis not present

## 2019-09-21 DIAGNOSIS — R05 Cough: Secondary | ICD-10-CM | POA: Diagnosis not present

## 2019-09-21 DIAGNOSIS — R531 Weakness: Secondary | ICD-10-CM | POA: Diagnosis not present

## 2019-09-21 DIAGNOSIS — R410 Disorientation, unspecified: Secondary | ICD-10-CM | POA: Diagnosis not present

## 2019-09-21 DIAGNOSIS — Z87891 Personal history of nicotine dependence: Secondary | ICD-10-CM | POA: Diagnosis not present

## 2019-09-21 DIAGNOSIS — K219 Gastro-esophageal reflux disease without esophagitis: Secondary | ICD-10-CM | POA: Diagnosis not present

## 2019-09-21 LAB — COMPREHENSIVE METABOLIC PANEL
ALT: 15 IU/L (ref 0–44)
AST: 15 IU/L (ref 0–40)
Albumin/Globulin Ratio: 1.2 (ref 1.2–2.2)
Albumin: 3.8 g/dL (ref 3.8–4.8)
Alkaline Phosphatase: 99 IU/L (ref 48–121)
BUN/Creatinine Ratio: 12 (ref 10–24)
BUN: 12 mg/dL (ref 8–27)
Bilirubin Total: 0.6 mg/dL (ref 0.0–1.2)
CO2: 26 mmol/L (ref 20–29)
Calcium: 9 mg/dL (ref 8.6–10.2)
Chloride: 100 mmol/L (ref 96–106)
Creatinine, Ser: 0.97 mg/dL (ref 0.76–1.27)
GFR calc Af Amer: 91 mL/min/{1.73_m2} (ref >59)
GFR calc non Af Amer: 79 mL/min/{1.73_m2} (ref >59)
Globulin, Total: 3.2 g/dL (ref 1.5–4.5)
Glucose: 97 mg/dL (ref 65–99)
Potassium: 3.3 mmol/L — ABNORMAL LOW (ref 3.5–5.2)
Sodium: 138 mmol/L (ref 134–144)
Total Protein: 7 g/dL (ref 6.0–8.5)

## 2019-09-21 LAB — CBC
Hematocrit: 42.9 % (ref 37.5–51.0)
Hemoglobin: 14.2 g/dL (ref 13.0–17.7)
MCH: 29 pg (ref 26.6–33.0)
MCHC: 33.1 g/dL (ref 31.5–35.7)
MCV: 88 fL (ref 79–97)
Platelets: 206 10*3/uL (ref 150–450)
RBC: 4.9 x10E6/uL (ref 4.14–5.80)
RDW: 13.3 % (ref 11.6–15.4)
WBC: 3.2 10*3/uL — ABNORMAL LOW (ref 3.4–10.8)

## 2019-09-21 LAB — DEMENTIA PANEL
Homocysteine: 9.6 umol/L (ref 0.0–17.2)
RPR Ser Ql: NONREACTIVE
TSH: 1.12 u[IU]/mL (ref 0.450–4.500)
Vitamin B-12: >2000 pg/mL — ABNORMAL HIGH (ref 232–1245)

## 2019-09-23 DIAGNOSIS — I10 Essential (primary) hypertension: Secondary | ICD-10-CM | POA: Diagnosis not present

## 2019-09-23 DIAGNOSIS — Z720 Tobacco use: Secondary | ICD-10-CM | POA: Diagnosis not present

## 2019-09-26 ENCOUNTER — Ambulatory Visit: Payer: Medicare Other

## 2019-09-27 ENCOUNTER — Encounter: Payer: Self-pay | Admitting: *Deleted

## 2019-09-29 DIAGNOSIS — I10 Essential (primary) hypertension: Secondary | ICD-10-CM | POA: Diagnosis not present

## 2019-10-01 DIAGNOSIS — R05 Cough: Secondary | ICD-10-CM | POA: Diagnosis not present

## 2019-10-01 MED ORDER — FAMOTIDINE 20 MG TABLET
ORAL_TABLET | Freq: Two times a day (BID) | ORAL | 0 refills | 30.00000 days | Status: CP
Start: 2019-10-01 — End: 2019-10-31

## 2019-10-01 NOTE — Progress Notes (Signed)
Kindly let the patient know that lab work for reversible causes of memory loss was all satisfactory.  Blood chemistries were significant only for slightly low potassium and he needs to see his primary care physician for potassium replacement as this appears to be old problem.  White blood cell count was slightly low but nothing to worry about and acceptable.

## 2019-10-02 ENCOUNTER — Telehealth: Payer: Self-pay | Admitting: *Deleted

## 2019-10-02 DIAGNOSIS — G252 Other specified forms of tremor: Secondary | ICD-10-CM | POA: Diagnosis not present

## 2019-10-02 DIAGNOSIS — I69318 Other symptoms and signs involving cognitive functions following cerebral infarction: Secondary | ICD-10-CM | POA: Diagnosis not present

## 2019-10-02 DIAGNOSIS — J1282 Pneumonia due to coronavirus disease 2019: Secondary | ICD-10-CM | POA: Diagnosis not present

## 2019-10-02 DIAGNOSIS — R410 Disorientation, unspecified: Secondary | ICD-10-CM | POA: Diagnosis not present

## 2019-10-02 DIAGNOSIS — Z87891 Personal history of nicotine dependence: Secondary | ICD-10-CM | POA: Diagnosis not present

## 2019-10-02 DIAGNOSIS — R531 Weakness: Secondary | ICD-10-CM | POA: Diagnosis not present

## 2019-10-02 DIAGNOSIS — I10 Essential (primary) hypertension: Secondary | ICD-10-CM | POA: Diagnosis not present

## 2019-10-02 DIAGNOSIS — U071 COVID-19: Secondary | ICD-10-CM | POA: Diagnosis not present

## 2019-10-02 MED ORDER — CHOLECALCIFEROL (VITAMIN D3) 25 MCG (1,000 UNIT) TABLET
ORAL_TABLET | Freq: Every day | ORAL | 2 refills | 30 days | Status: CP
Start: 2019-10-02 — End: 2019-12-31

## 2019-10-02 MED ORDER — POTASSIUM CHLORIDE ER 10 MEQ TABLET,EXTENDED RELEASE
Freq: Every day | ORAL | 0 days
Start: 2019-10-02 — End: ?

## 2019-10-02 MED ORDER — ASCORBIC ACID (VITAMIN C) 1,000 MG TABLET
ORAL_TABLET | Freq: Every day | ORAL | 2 refills | 30.00000 days | Status: CP
Start: 2019-10-02 — End: 2019-12-31

## 2019-10-02 MED ORDER — ZINC GLUCONATE 50 MG TABLET
ORAL_TABLET | Freq: Every day | ORAL | 0 refills | 30.00000 days | Status: CP
Start: 2019-10-02 — End: 2019-11-01

## 2019-10-02 MED ORDER — POTASSIUM CHLORIDE ER 10 MEQ PO TBCR: 10.0000 meq | EXTENDED_RELEASE_TABLET | Freq: Every day | ORAL | 2 refills | Status: AC

## 2019-10-02 NOTE — Telephone Encounter (Signed)
LVM requesting call back for lab results.  

## 2019-10-02 NOTE — Telephone Encounter (Signed)
Called home #, stated no longer working #. Called mobile #, no answer, no VM.

## 2019-10-02 NOTE — Telephone Encounter (Signed)
Transition Care Management Unsuccessful Follow-up Telephone Call  Date of discharge and from where:  10/01/2019  Attempts:  1st Attempt  Reason for unsuccessful TCM follow-up call:  Unable to leave message, 2nd phone number on file was disconnected.

## 2019-10-02 NOTE — Addendum Note (Signed)
Addended by: Caryl Pina on: 10/02/2019 09:10 PM   Modules accepted: Orders

## 2019-10-03 ENCOUNTER — Encounter: Payer: Self-pay | Admitting: *Deleted

## 2019-10-04 DIAGNOSIS — J1282 Pneumonia due to coronavirus disease 2019: Secondary | ICD-10-CM | POA: Diagnosis not present

## 2019-10-04 DIAGNOSIS — U071 COVID-19: Secondary | ICD-10-CM | POA: Diagnosis not present

## 2019-10-04 DIAGNOSIS — R410 Disorientation, unspecified: Secondary | ICD-10-CM | POA: Diagnosis not present

## 2019-10-04 DIAGNOSIS — I69318 Other symptoms and signs involving cognitive functions following cerebral infarction: Secondary | ICD-10-CM | POA: Diagnosis not present

## 2019-10-04 DIAGNOSIS — G252 Other specified forms of tremor: Secondary | ICD-10-CM | POA: Diagnosis not present

## 2019-10-04 DIAGNOSIS — I10 Essential (primary) hypertension: Secondary | ICD-10-CM | POA: Diagnosis not present

## 2019-10-04 DIAGNOSIS — R531 Weakness: Secondary | ICD-10-CM | POA: Diagnosis not present

## 2019-10-04 DIAGNOSIS — Z87891 Personal history of nicotine dependence: Secondary | ICD-10-CM | POA: Diagnosis not present

## 2019-10-08 ENCOUNTER — Other Ambulatory Visit: Payer: Medicare Other

## 2019-10-09 DIAGNOSIS — J1282 Pneumonia due to coronavirus disease 2019: Secondary | ICD-10-CM | POA: Diagnosis not present

## 2019-10-09 DIAGNOSIS — R410 Disorientation, unspecified: Secondary | ICD-10-CM | POA: Diagnosis not present

## 2019-10-09 DIAGNOSIS — G252 Other specified forms of tremor: Secondary | ICD-10-CM | POA: Diagnosis not present

## 2019-10-09 DIAGNOSIS — R531 Weakness: Secondary | ICD-10-CM | POA: Diagnosis not present

## 2019-10-09 DIAGNOSIS — I69318 Other symptoms and signs involving cognitive functions following cerebral infarction: Secondary | ICD-10-CM | POA: Diagnosis not present

## 2019-10-09 DIAGNOSIS — U071 COVID-19: Secondary | ICD-10-CM | POA: Diagnosis not present

## 2019-10-09 DIAGNOSIS — I10 Essential (primary) hypertension: Secondary | ICD-10-CM | POA: Diagnosis not present

## 2019-10-09 DIAGNOSIS — Z87891 Personal history of nicotine dependence: Secondary | ICD-10-CM | POA: Diagnosis not present

## 2019-10-09 NOTE — Telephone Encounter (Addendum)
Transition Care Management Unsuccessful Follow-up Telephone Call  Date of discharge and from where:  10/01/2019  Attempts:  2nd Attempt  Reason for unsuccessful TCM follow-up call:  Unable to reach patient

## 2019-10-10 DIAGNOSIS — U071 COVID-19: Secondary | ICD-10-CM | POA: Diagnosis not present

## 2019-10-10 DIAGNOSIS — R531 Weakness: Secondary | ICD-10-CM | POA: Diagnosis not present

## 2019-10-10 DIAGNOSIS — Z87891 Personal history of nicotine dependence: Secondary | ICD-10-CM | POA: Diagnosis not present

## 2019-10-10 DIAGNOSIS — R410 Disorientation, unspecified: Secondary | ICD-10-CM | POA: Diagnosis not present

## 2019-10-10 DIAGNOSIS — I10 Essential (primary) hypertension: Secondary | ICD-10-CM | POA: Diagnosis not present

## 2019-10-10 DIAGNOSIS — G252 Other specified forms of tremor: Secondary | ICD-10-CM | POA: Diagnosis not present

## 2019-10-10 DIAGNOSIS — J1282 Pneumonia due to coronavirus disease 2019: Secondary | ICD-10-CM | POA: Diagnosis not present

## 2019-10-10 DIAGNOSIS — I69318 Other symptoms and signs involving cognitive functions following cerebral infarction: Secondary | ICD-10-CM | POA: Diagnosis not present

## 2019-10-11 ENCOUNTER — Other Ambulatory Visit: Payer: Self-pay

## 2019-10-11 ENCOUNTER — Ambulatory Visit (INDEPENDENT_AMBULATORY_CARE_PROVIDER_SITE_OTHER): Payer: Medicare Other

## 2019-10-11 DIAGNOSIS — R410 Disorientation, unspecified: Secondary | ICD-10-CM

## 2019-10-11 DIAGNOSIS — J1282 Pneumonia due to coronavirus disease 2019: Secondary | ICD-10-CM | POA: Diagnosis not present

## 2019-10-11 DIAGNOSIS — Z8616 Personal history of COVID-19: Secondary | ICD-10-CM

## 2019-10-11 DIAGNOSIS — R531 Weakness: Secondary | ICD-10-CM

## 2019-10-11 DIAGNOSIS — I69318 Other symptoms and signs involving cognitive functions following cerebral infarction: Secondary | ICD-10-CM | POA: Diagnosis not present

## 2019-10-11 DIAGNOSIS — Z87891 Personal history of nicotine dependence: Secondary | ICD-10-CM

## 2019-10-11 DIAGNOSIS — G252 Other specified forms of tremor: Secondary | ICD-10-CM

## 2019-10-11 DIAGNOSIS — I10 Essential (primary) hypertension: Secondary | ICD-10-CM

## 2019-10-12 DIAGNOSIS — Z87891 Personal history of nicotine dependence: Secondary | ICD-10-CM | POA: Diagnosis not present

## 2019-10-12 DIAGNOSIS — U071 COVID-19: Secondary | ICD-10-CM | POA: Diagnosis not present

## 2019-10-12 DIAGNOSIS — R410 Disorientation, unspecified: Secondary | ICD-10-CM | POA: Diagnosis not present

## 2019-10-12 DIAGNOSIS — J1282 Pneumonia due to coronavirus disease 2019: Secondary | ICD-10-CM | POA: Diagnosis not present

## 2019-10-12 DIAGNOSIS — I10 Essential (primary) hypertension: Secondary | ICD-10-CM | POA: Diagnosis not present

## 2019-10-12 DIAGNOSIS — R531 Weakness: Secondary | ICD-10-CM | POA: Diagnosis not present

## 2019-10-12 DIAGNOSIS — I69318 Other symptoms and signs involving cognitive functions following cerebral infarction: Secondary | ICD-10-CM | POA: Diagnosis not present

## 2019-10-12 DIAGNOSIS — G252 Other specified forms of tremor: Secondary | ICD-10-CM | POA: Diagnosis not present

## 2019-10-15 DIAGNOSIS — R410 Disorientation, unspecified: Secondary | ICD-10-CM | POA: Diagnosis not present

## 2019-10-15 DIAGNOSIS — J1282 Pneumonia due to coronavirus disease 2019: Secondary | ICD-10-CM | POA: Diagnosis not present

## 2019-10-15 DIAGNOSIS — Z87891 Personal history of nicotine dependence: Secondary | ICD-10-CM | POA: Diagnosis not present

## 2019-10-15 DIAGNOSIS — I69318 Other symptoms and signs involving cognitive functions following cerebral infarction: Secondary | ICD-10-CM | POA: Diagnosis not present

## 2019-10-15 DIAGNOSIS — G252 Other specified forms of tremor: Secondary | ICD-10-CM | POA: Diagnosis not present

## 2019-10-15 DIAGNOSIS — I10 Essential (primary) hypertension: Secondary | ICD-10-CM | POA: Diagnosis not present

## 2019-10-15 DIAGNOSIS — U071 COVID-19: Secondary | ICD-10-CM | POA: Diagnosis not present

## 2019-10-15 DIAGNOSIS — R531 Weakness: Secondary | ICD-10-CM | POA: Diagnosis not present

## 2019-10-16 DIAGNOSIS — I10 Essential (primary) hypertension: Secondary | ICD-10-CM | POA: Diagnosis not present

## 2019-10-16 DIAGNOSIS — Z87891 Personal history of nicotine dependence: Secondary | ICD-10-CM | POA: Diagnosis not present

## 2019-10-16 DIAGNOSIS — J1282 Pneumonia due to coronavirus disease 2019: Secondary | ICD-10-CM | POA: Diagnosis not present

## 2019-10-16 DIAGNOSIS — R410 Disorientation, unspecified: Secondary | ICD-10-CM | POA: Diagnosis not present

## 2019-10-16 DIAGNOSIS — U071 COVID-19: Secondary | ICD-10-CM | POA: Diagnosis not present

## 2019-10-16 DIAGNOSIS — G252 Other specified forms of tremor: Secondary | ICD-10-CM | POA: Diagnosis not present

## 2019-10-16 DIAGNOSIS — R531 Weakness: Secondary | ICD-10-CM | POA: Diagnosis not present

## 2019-10-16 DIAGNOSIS — I69318 Other symptoms and signs involving cognitive functions following cerebral infarction: Secondary | ICD-10-CM | POA: Diagnosis not present

## 2019-10-17 DIAGNOSIS — R410 Disorientation, unspecified: Secondary | ICD-10-CM | POA: Diagnosis not present

## 2019-10-17 DIAGNOSIS — I10 Essential (primary) hypertension: Secondary | ICD-10-CM | POA: Diagnosis not present

## 2019-10-17 DIAGNOSIS — U071 COVID-19: Secondary | ICD-10-CM | POA: Diagnosis not present

## 2019-10-17 DIAGNOSIS — G252 Other specified forms of tremor: Secondary | ICD-10-CM | POA: Diagnosis not present

## 2019-10-17 DIAGNOSIS — R531 Weakness: Secondary | ICD-10-CM | POA: Diagnosis not present

## 2019-10-17 DIAGNOSIS — Z87891 Personal history of nicotine dependence: Secondary | ICD-10-CM | POA: Diagnosis not present

## 2019-10-17 DIAGNOSIS — I69318 Other symptoms and signs involving cognitive functions following cerebral infarction: Secondary | ICD-10-CM | POA: Diagnosis not present

## 2019-10-17 DIAGNOSIS — J1282 Pneumonia due to coronavirus disease 2019: Secondary | ICD-10-CM | POA: Diagnosis not present

## 2019-10-18 ENCOUNTER — Telehealth: Payer: Self-pay

## 2019-10-18 ENCOUNTER — Ambulatory Visit (INDEPENDENT_AMBULATORY_CARE_PROVIDER_SITE_OTHER): Payer: Medicare Other | Admitting: Nurse Practitioner

## 2019-10-18 ENCOUNTER — Encounter: Payer: Self-pay | Admitting: Nurse Practitioner

## 2019-10-18 DIAGNOSIS — R410 Disorientation, unspecified: Secondary | ICD-10-CM | POA: Diagnosis not present

## 2019-10-18 DIAGNOSIS — I10 Essential (primary) hypertension: Secondary | ICD-10-CM | POA: Diagnosis not present

## 2019-10-18 DIAGNOSIS — Z8616 Personal history of COVID-19: Secondary | ICD-10-CM | POA: Diagnosis not present

## 2019-10-18 DIAGNOSIS — Z87891 Personal history of nicotine dependence: Secondary | ICD-10-CM | POA: Diagnosis not present

## 2019-10-18 DIAGNOSIS — J1282 Pneumonia due to coronavirus disease 2019: Secondary | ICD-10-CM | POA: Diagnosis not present

## 2019-10-18 DIAGNOSIS — R531 Weakness: Secondary | ICD-10-CM | POA: Diagnosis not present

## 2019-10-18 DIAGNOSIS — I69318 Other symptoms and signs involving cognitive functions following cerebral infarction: Secondary | ICD-10-CM | POA: Diagnosis not present

## 2019-10-18 DIAGNOSIS — G252 Other specified forms of tremor: Secondary | ICD-10-CM | POA: Diagnosis not present

## 2019-10-18 DIAGNOSIS — U071 COVID-19: Secondary | ICD-10-CM | POA: Diagnosis not present

## 2019-10-18 MED ORDER — PREDNISONE 20 MG PO TABS: ORAL_TABLET | ORAL | 0 refills | Status: DC

## 2019-10-18 NOTE — Progress Notes (Addendum)
Virtual Visit via telephone Note Due to COVID-19 pandemic this visit was conducted virtually. This visit type was conducted due to national recommendations for restrictions regarding the COVID-19 Pandemic (e.g. social distancing, sheltering in place) in an effort to limit this patient's exposure and mitigate transmission in our community. All issues noted in this document were discussed and addressed.  A physical exam was not performed with this format.  I connected with Todd Berg on 10/18/19 at 3:40 by telephone and verified that I am speaking with the correct person using two identifiers. Todd Berg is currently located at home and his daughter is currently with him during visit. The provider, Mary-Margaret Hassell Done, FNP is located in their office at time of visit.  I discussed the limitations, risks, security and privacy concerns of performing an evaluation and management service by telephone and the availability of in person appointments. I also discussed with the patient that there may be a patient responsible charge related to this service. The patient expressed understanding and agreed to proceed.   History and Present Illness:   Chief Complaint: Covid Exposure   HPI Patients daughter called in stating that patient (has altzhimers). He has a home health nurse that comes to see him weekly. He had covid 2 weeks ago. Today he has been laying around today. The home health nurse said that she heard some wheezes in his lungs this evening. His daughter says he has no cough, no fever and no congestion. Appetite is good. He is on zinc , vitamin d and b12. He is not complaining of anything. He does not act like he is SOB. Daughter says he is acting normal   Review of Systems  Constitutional: Negative for diaphoresis and weight loss.  Eyes: Negative for blurred vision, double vision and pain.  Respiratory: Negative for shortness of breath.   Cardiovascular: Negative for chest pain,  palpitations, orthopnea and leg swelling.  Gastrointestinal: Negative for abdominal pain.  Skin: Negative for rash.  Neurological: Negative for dizziness, sensory change, loss of consciousness, weakness and headaches.  Endo/Heme/Allergies: Negative for polydipsia. Does not bruise/bleed easily.  Psychiatric/Behavioral: Negative for memory loss. The patient does not have insomnia.   All other systems reviewed and are negative.    Observations/Objective: Could not speak with patient due to altzhimers  Assessment and Plan: Todd Berg in today with chief complaint of Covid Exposure   1. Personal history of COVID-19 Watch for any change in symptoms If develops fever or sob needs  To go back to the ER   Meds ordered this encounter  Medications  . predniSONE (DELTASONE) 20 MG tablet    Sig: 2 po at sametime daily for 5 days    Dispense:  10 tablet    Refill:  0    Order Specific Question:   Supervising Provider    Answer:   Caryl Pina A [[id number]]     Follow Up Instructions: prn    I discussed the assessment and treatment plan with the patient. The patient was provided an opportunity to ask questions and all were answered. The patient agreed with the plan and demonstrated an understanding of the instructions.   The patient was advised to call back or seek an in-person evaluation if the symptoms worsen or if the condition fails to improve as anticipated.  The above assessment and management plan was discussed with the patient. The patient verbalized understanding of and has agreed to the management plan. Patient is aware to call  the clinic if symptoms persist or worsen. Patient is aware when to return to the clinic for a follow-up visit. Patient educated on when it is appropriate to go to the emergency department.   Time call ended:  4:05  I provided 25 minutes of non-face-to-face time during this encounter.    Mary-Margaret Hassell Done, FNP   * Home health nurse said  called stating that family called wanting her to get there immediately. When she got there he could not get out of bed. Sweating and chiling but temporal temp was 98.9. wheezing and sounded "nasal".

## 2019-10-18 NOTE — Addendum Note (Signed)
Addended by: Chevis Pretty on: 10/18/2019 04:44 PM   Modules accepted: Orders

## 2019-10-22 ENCOUNTER — Other Ambulatory Visit: Payer: Medicare Other

## 2019-10-22 DIAGNOSIS — I69318 Other symptoms and signs involving cognitive functions following cerebral infarction: Secondary | ICD-10-CM | POA: Diagnosis not present

## 2019-10-22 DIAGNOSIS — U071 COVID-19: Secondary | ICD-10-CM | POA: Diagnosis not present

## 2019-10-22 DIAGNOSIS — R531 Weakness: Secondary | ICD-10-CM | POA: Diagnosis not present

## 2019-10-22 DIAGNOSIS — G252 Other specified forms of tremor: Secondary | ICD-10-CM | POA: Diagnosis not present

## 2019-10-22 DIAGNOSIS — Z87891 Personal history of nicotine dependence: Secondary | ICD-10-CM | POA: Diagnosis not present

## 2019-10-22 DIAGNOSIS — R410 Disorientation, unspecified: Secondary | ICD-10-CM | POA: Diagnosis not present

## 2019-10-22 DIAGNOSIS — I10 Essential (primary) hypertension: Secondary | ICD-10-CM | POA: Diagnosis not present

## 2019-10-22 DIAGNOSIS — J1282 Pneumonia due to coronavirus disease 2019: Secondary | ICD-10-CM | POA: Diagnosis not present

## 2019-10-23 ENCOUNTER — Encounter: Payer: Self-pay | Admitting: Neurology

## 2019-10-24 DIAGNOSIS — I69318 Other symptoms and signs involving cognitive functions following cerebral infarction: Secondary | ICD-10-CM | POA: Diagnosis not present

## 2019-10-24 DIAGNOSIS — U071 COVID-19: Secondary | ICD-10-CM | POA: Diagnosis not present

## 2019-10-24 DIAGNOSIS — R410 Disorientation, unspecified: Secondary | ICD-10-CM | POA: Diagnosis not present

## 2019-10-24 DIAGNOSIS — R531 Weakness: Secondary | ICD-10-CM | POA: Diagnosis not present

## 2019-10-24 DIAGNOSIS — J1282 Pneumonia due to coronavirus disease 2019: Secondary | ICD-10-CM | POA: Diagnosis not present

## 2019-10-24 DIAGNOSIS — Z87891 Personal history of nicotine dependence: Secondary | ICD-10-CM | POA: Diagnosis not present

## 2019-10-24 DIAGNOSIS — I10 Essential (primary) hypertension: Secondary | ICD-10-CM | POA: Diagnosis not present

## 2019-10-24 DIAGNOSIS — G252 Other specified forms of tremor: Secondary | ICD-10-CM | POA: Diagnosis not present

## 2019-10-25 ENCOUNTER — Telehealth: Payer: Self-pay | Admitting: *Deleted

## 2019-10-25 NOTE — Telephone Encounter (Signed)
If the pain continues then he may need to come in and be seen.

## 2019-10-25 NOTE — Telephone Encounter (Signed)
Spoke with Nurse at [employer]. She states that pt was fine yesterday. Nurse gave daughters number. Levada [phone]. Wife has passed away.   Levada Dy states that pt is having bilateral leg pain. Pt has been scheduled 10/29/2019. Instructed to go to ER as Dr. Warrick Parisian instructed if pain becomes worse.

## 2019-10-25 NOTE — Telephone Encounter (Signed)
VM from [facility]/ [facility] Amy received message from daughter Levada Dy Pt has been in bed all day, says legs hurt so bad, crying, hurt too much to get out of bed Amy did see patient yesterday and they walked, and everything was ok, she is not sure why his legs hurt or he is in pain

## 2019-10-29 ENCOUNTER — Ambulatory Visit: Payer: Medicare Other | Admitting: Family Medicine

## 2019-10-29 ENCOUNTER — Other Ambulatory Visit: Payer: Self-pay | Admitting: Family Medicine

## 2019-10-29 DIAGNOSIS — J1282 Pneumonia due to coronavirus disease 2019: Secondary | ICD-10-CM | POA: Diagnosis not present

## 2019-10-29 DIAGNOSIS — I10 Essential (primary) hypertension: Secondary | ICD-10-CM

## 2019-10-29 DIAGNOSIS — U071 COVID-19: Secondary | ICD-10-CM | POA: Diagnosis not present

## 2019-10-29 DIAGNOSIS — R531 Weakness: Secondary | ICD-10-CM | POA: Diagnosis not present

## 2019-10-29 DIAGNOSIS — I69318 Other symptoms and signs involving cognitive functions following cerebral infarction: Secondary | ICD-10-CM | POA: Diagnosis not present

## 2019-10-29 DIAGNOSIS — G252 Other specified forms of tremor: Secondary | ICD-10-CM | POA: Diagnosis not present

## 2019-10-29 DIAGNOSIS — Z87891 Personal history of nicotine dependence: Secondary | ICD-10-CM | POA: Diagnosis not present

## 2019-10-29 DIAGNOSIS — R410 Disorientation, unspecified: Secondary | ICD-10-CM | POA: Diagnosis not present

## 2019-10-30 DIAGNOSIS — Z87891 Personal history of nicotine dependence: Secondary | ICD-10-CM | POA: Diagnosis not present

## 2019-10-30 DIAGNOSIS — R410 Disorientation, unspecified: Secondary | ICD-10-CM | POA: Diagnosis not present

## 2019-10-30 DIAGNOSIS — R531 Weakness: Secondary | ICD-10-CM | POA: Diagnosis not present

## 2019-10-30 DIAGNOSIS — G252 Other specified forms of tremor: Secondary | ICD-10-CM | POA: Diagnosis not present

## 2019-10-30 DIAGNOSIS — I69318 Other symptoms and signs involving cognitive functions following cerebral infarction: Secondary | ICD-10-CM | POA: Diagnosis not present

## 2019-10-30 DIAGNOSIS — I10 Essential (primary) hypertension: Secondary | ICD-10-CM | POA: Diagnosis not present

## 2019-10-30 DIAGNOSIS — J1282 Pneumonia due to coronavirus disease 2019: Secondary | ICD-10-CM | POA: Diagnosis not present

## 2019-10-30 DIAGNOSIS — U071 COVID-19: Secondary | ICD-10-CM | POA: Diagnosis not present

## 2019-10-31 ENCOUNTER — Encounter: Payer: Self-pay | Admitting: Family Medicine

## 2019-10-31 DIAGNOSIS — R531 Weakness: Secondary | ICD-10-CM | POA: Diagnosis not present

## 2019-10-31 DIAGNOSIS — J1282 Pneumonia due to coronavirus disease 2019: Secondary | ICD-10-CM | POA: Diagnosis not present

## 2019-10-31 DIAGNOSIS — U071 COVID-19: Secondary | ICD-10-CM | POA: Diagnosis not present

## 2019-10-31 DIAGNOSIS — Z87891 Personal history of nicotine dependence: Secondary | ICD-10-CM | POA: Diagnosis not present

## 2019-10-31 DIAGNOSIS — G252 Other specified forms of tremor: Secondary | ICD-10-CM | POA: Diagnosis not present

## 2019-10-31 DIAGNOSIS — I10 Essential (primary) hypertension: Secondary | ICD-10-CM | POA: Diagnosis not present

## 2019-10-31 DIAGNOSIS — I69318 Other symptoms and signs involving cognitive functions following cerebral infarction: Secondary | ICD-10-CM | POA: Diagnosis not present

## 2019-10-31 DIAGNOSIS — R410 Disorientation, unspecified: Secondary | ICD-10-CM | POA: Diagnosis not present

## 2019-11-06 ENCOUNTER — Other Ambulatory Visit: Payer: Self-pay | Admitting: Family Medicine

## 2019-11-06 DIAGNOSIS — R531 Weakness: Secondary | ICD-10-CM | POA: Diagnosis not present

## 2019-11-06 DIAGNOSIS — Z87891 Personal history of nicotine dependence: Secondary | ICD-10-CM | POA: Diagnosis not present

## 2019-11-06 DIAGNOSIS — I10 Essential (primary) hypertension: Secondary | ICD-10-CM | POA: Diagnosis not present

## 2019-11-06 DIAGNOSIS — U071 COVID-19: Secondary | ICD-10-CM | POA: Diagnosis not present

## 2019-11-06 DIAGNOSIS — R410 Disorientation, unspecified: Secondary | ICD-10-CM | POA: Diagnosis not present

## 2019-11-06 DIAGNOSIS — J1282 Pneumonia due to coronavirus disease 2019: Secondary | ICD-10-CM | POA: Diagnosis not present

## 2019-11-06 DIAGNOSIS — G252 Other specified forms of tremor: Secondary | ICD-10-CM | POA: Diagnosis not present

## 2019-11-06 DIAGNOSIS — I69318 Other symptoms and signs involving cognitive functions following cerebral infarction: Secondary | ICD-10-CM | POA: Diagnosis not present

## 2019-11-07 DIAGNOSIS — I69318 Other symptoms and signs involving cognitive functions following cerebral infarction: Secondary | ICD-10-CM | POA: Diagnosis not present

## 2019-11-07 DIAGNOSIS — Z87891 Personal history of nicotine dependence: Secondary | ICD-10-CM | POA: Diagnosis not present

## 2019-11-07 DIAGNOSIS — R410 Disorientation, unspecified: Secondary | ICD-10-CM | POA: Diagnosis not present

## 2019-11-07 DIAGNOSIS — I10 Essential (primary) hypertension: Secondary | ICD-10-CM | POA: Diagnosis not present

## 2019-11-07 DIAGNOSIS — G252 Other specified forms of tremor: Secondary | ICD-10-CM | POA: Diagnosis not present

## 2019-11-07 DIAGNOSIS — J1282 Pneumonia due to coronavirus disease 2019: Secondary | ICD-10-CM | POA: Diagnosis not present

## 2019-11-07 DIAGNOSIS — R531 Weakness: Secondary | ICD-10-CM | POA: Diagnosis not present

## 2019-11-07 DIAGNOSIS — U071 COVID-19: Secondary | ICD-10-CM | POA: Diagnosis not present

## 2019-11-26 ENCOUNTER — Ambulatory Visit: Payer: Medicare Other | Admitting: Family Medicine

## 2019-11-28 ENCOUNTER — Encounter: Payer: Self-pay | Admitting: Family Medicine

## 2019-12-10 ENCOUNTER — Ambulatory Visit (INDEPENDENT_AMBULATORY_CARE_PROVIDER_SITE_OTHER): Payer: Medicare Other | Admitting: Neurology

## 2019-12-10 ENCOUNTER — Encounter: Payer: Self-pay | Admitting: Neurology

## 2019-12-10 ENCOUNTER — Other Ambulatory Visit: Payer: Self-pay

## 2019-12-10 VITALS — BP 146/84 | HR 79 | Ht 69.0 in | Wt 213.2 lb

## 2019-12-10 DIAGNOSIS — F0391 Unspecified dementia with behavioral disturbance: Secondary | ICD-10-CM

## 2019-12-10 DIAGNOSIS — F0151 Vascular dementia with behavioral disturbance: Secondary | ICD-10-CM | POA: Diagnosis not present

## 2019-12-10 DIAGNOSIS — G4701 Insomnia due to medical condition: Secondary | ICD-10-CM | POA: Diagnosis not present

## 2019-12-10 DIAGNOSIS — F03911 Unspecified dementia, unspecified severity, with agitation: Secondary | ICD-10-CM

## 2019-12-10 DIAGNOSIS — F01518 Vascular dementia, unspecified severity, with other behavioral disturbance: Secondary | ICD-10-CM

## 2019-12-10 MED ORDER — ASPIRIN EC 81 MG PO TBEC: 81.0000 mg | DELAYED_RELEASE_TABLET | Freq: Every day | ORAL | 2 refills | Status: DC

## 2019-12-10 NOTE — Progress Notes (Signed)
[facility] [address] [place]. [place] [address] [phone]       OFFICE FOLLOW UP VISIT NOTE  [name] Date of Birth:  [birth year] Medical Record Number:  [id number]   Referring MD: Vonna Kotyk Dettinger  Reason for Referral: Memory loss  HPI: Initial consultation note 09/20/2019 Todd Berg is a 69 year old Caucasian male seen today for office consultation visit for memory loss.  History is obtained from the patient and his daughter Todd Berg was accompanying him as well as review of electronic medical records and I have reviewed personally available imaging films in PACS.  Todd Berg is a past medical history of rectal cancer, chronic back pain, hypertension, left thalamic lacunar infarct in May 2019 with no remaining residual deficits.  He has had cognitive decline he feels ever since his stroke 2 years ago.  This has been progressive in recent months seems to have impaired his functionality at home.  He gets quite forgetful even after being told several times recent information.  He gets agitated easily.  He can still remember things from the past well but recent incident seem difficult.  He has had not had any violent behavior and denies any delusions, hallucinations or unsafe behavior.  He is eats and sleeps well.  He can walk independently without assistance.  He has had no recurrent symptoms of new strokes.  He does have family history of dementia in his mother in her 68s.  Patient did have an outpatient MRI scan of the brain done on 08/27/2019 which I personally reviewed shows left thalamic infarcts of remote age and 2 lesions separated one of them was on a previous MRI from May 29, 2017 the other 1 appears to be in new but not recent.  There is diminished flow noted in the right vertebral artery.  Previously echocardiogram in May 2019 was unremarkable.  He has not had any recent dementia pending labs.  The family denies any history of witnessed seizure, head injury,  loss of consciousness.  He denies history of depression.  He apparently had Mini-Mental status done at the primary care physician's office recently which also showed 9/30. Update 12/10/2019: He returns for follow-up after last visit 2 months ago.  Patient is accompanied by his daughter Todd Berg.  Patient got Covid infection in September along with his wife who unfortunately later died of Covid complications.  Patient has been able to tolerate Namenda 10 mg twice daily but family feels that his still has significant cognitive impairment.  Requires 24-hour supervision.  He has been getting agitated more frequently.  He has trouble sleeping at night despite taking melatonin which he takes at bedtime.  Is also gets scared easily and sometimes paranoid.  He has had longstanding tremors in his hands.  There have been no unsafe behaviors, delusions or hallucinations.  Blood pressure usually well controlled today it is 146/84 in office.  He has not had any recurrent stroke or TIA symptoms.  He is unable to tolerate aspirin because of previous history of GI hemorrhage and hence is not taking it ROS:   14 system review of systems is positive for memory loss, forgetfulness, getting lost,, disorientation, agitation and all other systems negative  PMH:  Past Medical History:  Diagnosis Date  . Cancer ([place]) 2015   rectal  . Chronic back pain   . COPD (chronic obstructive pulmonary disease) ([place])   . GERD (gastroesophageal reflux disease)   . Hematemesis   . Hemorrhoids   . Hyperlipidemia   .  Hypotension   . Melena   . Melena 10/12/2017  . Stroke [facility]) 2019    Social History:  Social History   Socioeconomic History  . Marital status: Married    Spouse name: Todd Berg  . Number of children: 5  . Years of education: 8  . Highest education level: 8th grade  Occupational History  . Occupation: Retired  Tobacco Use  . Smoking status: Former Smoker    Start date: 12/30/1964    Quit date: 06/19/2009    Years  since quitting: 10.4  . Smokeless tobacco: Former Systems developer    Types: Snuff    Quit date: 09/26/2005  Vaping Use  . Vaping Use: Never used  Substance and Sexual Activity  . Alcohol use: No  . Drug use: No  . Sexual activity: Not Currently  Other Topics Concern  . Not on file  Social History Narrative   Lives with daughter and son in law   Right Handed   Drinks 3-5 cups caffeine daily   Social Determinants of Health   Financial Resource Strain: Low Risk   . Difficulty of Paying Living Expenses: Not hard at all  Food Insecurity: No Food Insecurity  . Worried About Charity fundraiser in the Last Year: Never true  . Ran Out of Food in the Last Year: Never true  Transportation Needs: No Transportation Needs  . Lack of Transportation (Medical): No  . Lack of Transportation (Non-Medical): No  Physical Activity: Inactive  . Days of Exercise per Week: 0 days  . Minutes of Exercise per Session: 0 min  Stress: No Stress Concern Present  . Feeling of Stress : Not at all  Social Connections: Socially Integrated  . Frequency of Communication with Friends and Family: More than three times a week  . Frequency of Social Gatherings with Friends and Family: More than three times a week  . Attends Religious Services: More than 4 times per year  . Active Member of Clubs or Organizations: Yes  . Attends Archivist Meetings: More than 4 times per year  . Marital Status: Married  Human resources officer Violence: Not At Risk  . Fear of Current or Ex-Partner: No  . Emotionally Abused: No  . Physically Abused: No  . Sexually Abused: No    Medications:   Current Outpatient Medications on File Prior to Visit  Medication Sig Dispense Refill  . amLODipine (NORVASC) 2.5 MG tablet TAKE 1 TABLET ONCE DIALY 90 tablet 0  . cetirizine (ZYRTEC) 10 MG tablet Take 1 tablet (10 mg total) by mouth daily. For itching 90 tablet 0  . Cyanocobalamin (VITAMIN B-12) 5000 MCG SUBL DISSOLVE 1 TABLET UNDER TONGUE  DAILY 110 tablet 0  . memantine (NAMENDA TITRATION PAK) tablet pack 5 mg/day for =1 week; 5 mg twice daily for =1 week; 15 mg/day given in 5 mg and 10 mg separated doses for =1 week; then 10 mg twice daily 49 tablet 12  . pantoprazole (PROTONIX) 40 MG tablet Take 1 tablet (40 mg total) by mouth 2 (two) times daily before a meal. 180 tablet 0  . potassium chloride (KLOR-CON) 10 MEQ tablet Take 1 tablet (10 mEq total) by mouth daily. 30 tablet 2  . predniSONE (DELTASONE) 20 MG tablet 2 po at sametime daily for 5 days 10 tablet 0   No current facility-administered medications on file prior to visit.    Allergies:  No Known Allergies  Physical Exam General: well developed, well nourished elderly Caucasian male, seated,  in no evident distress Head: head normocephalic and atraumatic.   Neck: supple with no carotid or supraclavicular bruits Cardiovascular: regular rate and rhythm, no murmurs Musculoskeletal: no deformity Skin:  no rash/petichiae Vascular:  Normal pulses all extremities  Neurologic Exam Mental Status: Awake and fully alert. Oriented to place and time. Recent and remote memory poor t. Attention span, concentration and fund of knowledge diminished. Mood and affect appropriate.  Mini-Mental status exam score 9/30 with deficits in orientation, attention, calculation, following three-step commands and copying.  He was unable to copy intersecting pentagons.  Clock drawing score was 1/4.  He was able to name only 7 animals which can walk on 4 legs.  He was unable to copy intersecting pentagons.  Palmomental and grasp reflexes are absent.   Cranial Nerves: Fundoscopic exam not done pupils equal, briskly reactive to light. Extraocular movements full without nystagmus. Visual fields full to confrontation. Hearing intact. Facial sensation intact. Face, tongue, palate moves normally and symmetrically.  Motor: Normal bulk and tone. Normal strength in all tested extremity muscles.  Mild upper  extremity action tremor left greater than right.  No cogwheel rigidity or pill-rolling. Sensory.: intact to touch , pinprick , position and vibratory sensation.  Coordination: Rapid alternating movements normal in all extremities. Finger-to-nose and heel-to-shin performed accurately bilaterally. Gait and Station: Arises from chair without difficulty. Stance is normal. Gait demonstrates mild ataxia with broad-based gait.  Unsteady on a narrow base and while standing on either foot unsupported.. Able to heel, toe and tandem walk with moderate difficulty.  Reflexes: 1+ and symmetric. Toes downgoing.       ASSESSMENT: 69 year old Caucasian male with progressive memory and cognitive deterioration over the last 2 years following a small lacunar infarct likely due to Alzheimer's dementia.  He has tolerated Namenda and remain cognitively stable.  History of left thalamic lacunar infarct in May 2019 and additional left thalamic infarct noted on subsequent MRI scan on 08/27/2019 clinically silent.  Vascular risk factors of hypertension and small vessel disease only     PLAN: I had a long discussion with the patient and his daughter regarding his dementia and agitation and insomnia and answered questions.  Recommend he check EEG as well as CT angiogram of the brain and neck which I had suggested but not done it.  I recommend he continue Namenda 10 mg twice daily and the current dose as well as add melatonin 10 mg at sunset and use Benadryl or Tylenol PM in addition at bedtime to help him sleep.  He does not want to take aspirin due to history of GI hemorrhage in the past.Recommend maintain strict control of hypertension with blood pressure goal below 130/90.  He will return for follow-up in 3 months with my nurse practitioner Todd Berg or call earlier if necessary. Continue aspirin for stroke prevention with aggressive risk factor modification strict control of hypertension with blood pressure goal below 130/90,  lipids with LDL cholesterol goal below 70 mg percent and diabetes with hemoglobin A1c goal below 6.5%.  Greater than 50% time during this 25-minutevisit was spent on counseling and coordination of care about his memory loss cognitive impairment and dementia and answering questions.  Antony Contras, MD Note: This document was prepared with digital dictation and possible smart phrase technology. Any transcriptional errors that result from this process are unintentional.

## 2019-12-10 NOTE — Patient Instructions (Addendum)
I had a long discussion with the patient and his daughter regarding his dementia and agitation and insomnia and answered questions.  Recommend he check EEG as well as CT angiogram of the brain and neck which had suggested but not done it.  I recommend he continue Namenda 10 mg twice daily and the current dose as well as add melatonin 10 mg at sunset and use Benadryl or Tylenol PM in addition at bedtime to help him sleep.  He does not want to take aspirin due to history of GI hemorrhage in the past.Recommend maintain strict control of hypertension with blood pressure goal below 130/90.  He will return for follow-up in 3 months with my nurse practitioner [name] or call earlier if necessary.

## 2019-12-21 ENCOUNTER — Other Ambulatory Visit: Payer: Self-pay | Admitting: Neurology

## 2019-12-26 ENCOUNTER — Encounter (HOSPITAL_COMMUNITY): Payer: Self-pay | Admitting: Emergency Medicine

## 2019-12-26 ENCOUNTER — Emergency Department (HOSPITAL_COMMUNITY)
Admission: EM | Admit: 2019-12-26 | Discharge: 2019-12-26 | Disposition: A | Discharge status: Home / Self Care | Payer: Medicare Other | Attending: Emergency Medicine | Admitting: Emergency Medicine

## 2019-12-26 ENCOUNTER — Emergency Department (HOSPITAL_COMMUNITY): Payer: Medicare Other

## 2019-12-26 ENCOUNTER — Other Ambulatory Visit: Payer: Self-pay

## 2019-12-26 DIAGNOSIS — Z85048 Personal history of other malignant neoplasm of rectum, rectosigmoid junction, and anus: Secondary | ICD-10-CM | POA: Insufficient documentation

## 2019-12-26 DIAGNOSIS — R1084 Generalized abdominal pain: Secondary | ICD-10-CM | POA: Insufficient documentation

## 2019-12-26 DIAGNOSIS — F0390 Unspecified dementia without behavioral disturbance: Secondary | ICD-10-CM | POA: Diagnosis not present

## 2019-12-26 DIAGNOSIS — K219 Gastro-esophageal reflux disease without esophagitis: Secondary | ICD-10-CM | POA: Insufficient documentation

## 2019-12-26 DIAGNOSIS — K5900 Constipation, unspecified: Secondary | ICD-10-CM | POA: Insufficient documentation

## 2019-12-26 DIAGNOSIS — R109 Unspecified abdominal pain: Secondary | ICD-10-CM | POA: Diagnosis not present

## 2019-12-26 DIAGNOSIS — J449 Chronic obstructive pulmonary disease, unspecified: Secondary | ICD-10-CM | POA: Insufficient documentation

## 2019-12-26 DIAGNOSIS — R1030 Lower abdominal pain, unspecified: Secondary | ICD-10-CM | POA: Diagnosis present

## 2019-12-26 DIAGNOSIS — Z79899 Other long term (current) drug therapy: Secondary | ICD-10-CM | POA: Insufficient documentation

## 2019-12-26 DIAGNOSIS — Z87891 Personal history of nicotine dependence: Secondary | ICD-10-CM | POA: Insufficient documentation

## 2019-12-26 LAB — URINALYSIS, ROUTINE W REFLEX MICROSCOPIC
Bilirubin Urine: NEGATIVE
Glucose, UA: NEGATIVE mg/dL
Hgb urine dipstick: NEGATIVE
Ketones, ur: NEGATIVE mg/dL
Leukocytes,Ua: NEGATIVE
Nitrite: NEGATIVE
Protein, ur: 30 mg/dL — AB
Specific Gravity, Urine: 1.021 (ref 1.005–1.030)
pH: 6 (ref 5.0–8.0)

## 2019-12-26 LAB — CBC
HCT: 45.1 % (ref 39.0–52.0)
Hemoglobin: 14.7 g/dL (ref 13.0–17.0)
MCH: 29 pg (ref 26.0–34.0)
MCHC: 32.6 g/dL (ref 30.0–36.0)
MCV: 89 fL (ref 80.0–100.0)
Platelets: 294 10*3/uL (ref 150–400)
RBC: 5.07 MIL/uL (ref 4.22–5.81)
RDW: 12.7 % (ref 11.5–15.5)
WBC: 7.3 10*3/uL (ref 4.0–10.5)
nRBC: 0 % (ref 0.0–0.2)

## 2019-12-26 LAB — COMPREHENSIVE METABOLIC PANEL
ALT: 14 U/L (ref 0–44)
AST: 14 U/L — ABNORMAL LOW (ref 15–41)
Albumin: 3.7 g/dL (ref 3.5–5.0)
Alkaline Phosphatase: 76 U/L (ref 38–126)
Anion gap: 10 (ref 5–15)
BUN: 16 mg/dL (ref 8–23)
CO2: 25 mmol/L (ref 22–32)
Calcium: 9 mg/dL (ref 8.9–10.3)
Chloride: 103 mmol/L (ref 98–111)
Creatinine, Ser: 0.88 mg/dL (ref 0.61–1.24)
GFR, Estimated: >60 mL/min (ref >60)
Glucose, Bld: 102 mg/dL — ABNORMAL HIGH (ref 70–99)
Potassium: 3.4 mmol/L — ABNORMAL LOW (ref 3.5–5.1)
Sodium: 138 mmol/L (ref 135–145)
Total Bilirubin: 0.6 mg/dL (ref 0.3–1.2)
Total Protein: 7.2 g/dL (ref 6.5–8.1)

## 2019-12-26 LAB — LIPASE, BLOOD: Lipase: 29 U/L (ref 11–51)

## 2019-12-26 MED ORDER — POLYETHYLENE GLYCOL 3350 17 GM/SCOOP PO POWD: 17.0000 g | ORAL | 0 refills | Status: DC | PRN

## 2019-12-26 MED ORDER — IOHEXOL 300 MG/ML  SOLN
100.0000 mL | Freq: Once | INTRAMUSCULAR | Status: AC | PRN
  Administered 2019-12-26: 100 mL via INTRAVENOUS

## 2019-12-26 MED ORDER — DOCUSATE SODIUM 100 MG PO CAPS: 100.0000 mg | ORAL_CAPSULE | Freq: Every day | ORAL | 0 refills | Status: AC | PRN

## 2019-12-26 NOTE — ED Triage Notes (Signed)
Pt c/o abdominal pain " for a long time."   Pt says he is unable to eat due to the pain. Denies N/V/D/F

## 2019-12-26 NOTE — ED Notes (Signed)
Patient discharged to daughter 54.  All discharge instructions reviewed.  Patient daughter verbalized understanding via teachback method.  Ambulatory out of ED.

## 2019-12-26 NOTE — Discharge Instructions (Signed)
Take MiraLAX and Colace daily as needed for constipation. Recheck with your doctor this week, return to the ER for worsening or concerning symptoms. Your CT scan today shows possible fat necrosis in the left lower abdomen, recommend following up with your doctor, consider repeat noncontrast CT in 6 months per radiology recommendation.

## 2019-12-26 NOTE — ED Provider Notes (Signed)
[facility] EMERGENCY DEPARTMENT Provider Note   CSN: [id number] Arrival date & time: 12/26/19  1341     History Chief Complaint  Patient presents with  . Abdominal Pain    Todd Berg is a 69 y.o. male.  69 year old male with history of rectal cancer, CVA, GI bleed, presents with complaint of lower abdominal pain. Patient is a poor historian, is alert to person and place, otherwise struggles to answer questions. States his stomach feels worse to the touch, no pain with walking. Denies changes in bladder/bowel habits, nausea, vomiting. Unable to say how long this has been going on for.         Past Medical History:  Diagnosis Date  . Cancer ([place]) 2015   rectal  . Chronic back pain   . COPD (chronic obstructive pulmonary disease) ([place])   . GERD (gastroesophageal reflux disease)   . Hematemesis   . Hemorrhoids   . Hyperlipidemia   . Hypotension   . Melena   . Melena 10/12/2017  . Stroke [facility]) 2019    Patient Active Problem List   Diagnosis Date Noted  . Memory changes 11/30/2018  . H/O: CVA (cerebrovascular accident) 11/20/2018  . GI bleed 11/20/2018  . Upper GI bleed   . Melena 11/19/2018  . Infestation by bed bug 11/14/2018  . Gallstones 11/14/2018  . Vitamin B12 deficiency 08/03/2018  . Numbness and tingling of both legs 08/03/2018  . Anemia 10/10/2017  . Fatigue 10/10/2017  . Hypertension 03/21/2017  . Generalized anxiety disorder 03/21/2017  . Degenerative disc disease, lumbar 03/17/2017  . Iron deficiency anemia due to chronic blood loss 11/20/2016  . Chronic back pain   . COPD (chronic obstructive pulmonary disease) ([place])   . GERD (gastroesophageal reflux disease)   . History of rectal cancer 09/13/2015    Past Surgical History:  Procedure Laterality Date  . BACK SURGERY    . BIOPSY  10/13/2017   Procedure: BIOPSY;  Surgeon: Laurence Spates, MD;  Location: [facility] ENDOSCOPY;  Service: Endoscopy;;  . BIOPSY  11/20/2018   Procedure: BIOPSY;   Surgeon: Daneil Dolin, MD;  Location: [facility] ENDO SUITE;  Service: Endoscopy;;  . COLONOSCOPY  08/2015   Dr. [name]: normal  . ESOPHAGEAL DILATION  11/20/2018   Procedure: ESOPHAGEAL DILATION;  Surgeon: Daneil Dolin, MD;  Location: [facility] ENDO SUITE;  Service: Endoscopy;;  . ESOPHAGOGASTRODUODENOSCOPY N/A 06/09/2016   Dr. Wilford Corner: LA Grade A reflux esophagitis, non-bleeding gastric ulcers s/p epi. Large amount of old blood in dependent portions of stomach. H.pylori serology: negative.   . ESOPHAGOGASTRODUODENOSCOPY (EGD) WITH PROPOFOL Left 11/21/2016   Dr. Paulita Fujita: small hiatal hernia, benign-appearing esophageal stenosis, non-bleeding gastric ulcer without stigmata of bleeding. 6 mm in largest dimension. Gastritis. Normal duodenum. Felt to be NSAID-induced.   . ESOPHAGOGASTRODUODENOSCOPY (EGD) WITH PROPOFOL N/A 10/13/2017   Dr. Oletta Lamas: large hiatal hernia, one cratered gastric ulcer in preplyoric region of stomach, 8 mm in largest dimension s/p biopsy. Negative H.pylori  . ESOPHAGOGASTRODUODENOSCOPY (EGD) WITH PROPOFOL N/A 11/20/2018   Procedure: ESOPHAGOGASTRODUODENOSCOPY (EGD) WITH PROPOFOL;  Surgeon: Daneil Dolin, MD;  Location: [facility] ENDO SUITE;  Service: Endoscopy;  Laterality: N/A;  . HEMORRHOID SURGERY    . HERNIA REPAIR    . RECTAL SURGERY         Family History  Problem Relation Age of Onset  . COPD Mother   . Obesity Mother   . Stroke Father     Social History   Tobacco  Use  . Smoking status: Former Smoker    Start date: 12/30/1964    Quit date: 06/19/2009    Years since quitting: 10.5  . Smokeless tobacco: Former Systems developer    Types: Snuff    Quit date: 09/26/2005  Vaping Use  . Vaping Use: Never used  Substance Use Topics  . Alcohol use: No  . Drug use: No    Home Medications Prior to Admission medications   Medication Sig Start Date End Date Taking? Authorizing Provider  amLODipine (NORVASC) 2.5 MG tablet TAKE 1 TABLET ONCE DIALY Patient taking differently:  Take 2.5 mg by mouth daily. 11/06/19  Yes Dettinger, [name], MD  cetirizine (ZYRTEC) 10 MG tablet Take 1 tablet (10 mg total) by mouth daily. For itching 06/05/19  Yes Hendricks Limes F, FNP  diphenhydramine-acetaminophen (TYLENOL PM) 25-500 MG TABS tablet Take 2 tablets by mouth at bedtime as needed.   Yes [provider]  Melatonin 10 MG TBCR Take 1 tablet by mouth See admin instructions. Take 1 tablet by mouth every day at 5:30 pm   Yes [provider]  memantine (NAMENDA TITRATION PACK) tablet pack Take 1 tablet (10 mg) twice daily. 12/25/19  Yes Garvin Fila, MD  pantoprazole (PROTONIX) 40 MG tablet Take 1 tablet (40 mg total) by mouth 2 (two) times daily before a meal. 06/05/19  Yes Hendricks Limes F, FNP  predniSONE (DELTASONE) 20 MG tablet 2 po at sametime daily for 5 days 10/18/19  Yes Hassell Done, Mary-Margaret, FNP  psyllium (METAMUCIL) 58.6 % packet Take 1 packet by mouth daily.   Yes [provider]  Cyanocobalamin (VITAMIN B-12) 5000 MCG SUBL DISSOLVE 1 TABLET UNDER TONGUE DAILY Patient not taking: No sig reported 01/24/19   Dettinger, [name], MD  docusate sodium (COLACE) 100 MG capsule Take 1 capsule (100 mg total) by mouth daily as needed for mild constipation. 12/26/19   Tacy Learn, PA-C  polyethylene glycol powder (MIRALAX) 17 GM/SCOOP powder Take 17 g by mouth as needed. Once daily as needed 12/26/19   Suella Broad A, PA-C  potassium chloride (KLOR-CON) 10 MEQ tablet Take 1 tablet (10 mEq total) by mouth daily. Patient not taking: Reported on 12/26/2019 10/02/19   Dettinger, [name], MD    Allergies    Patient has no known allergies.  Review of Systems   Review of Systems  Unable to perform ROS: Dementia  Gastrointestinal: Positive for abdominal pain.    Physical Exam Updated Vital Signs BP (!) 142/67 (BP Location: Right Arm)   Pulse 79   Temp 97.9 F (36.6 C) (Oral)   Resp 18   Ht 5\' 9"  (1.753 m)   Wt 96.7 kg   SpO2 100%   BMI 31.48  kg/m   Physical Exam Vitals and nursing note reviewed.  Constitutional:      General: He is not in acute distress.    Appearance: He is well-developed and well-nourished. He is not diaphoretic.  HENT:     Head: Normocephalic and atraumatic.  Cardiovascular:     Rate and Rhythm: Normal rate and regular rhythm.     Heart sounds: Normal heart sounds.  Pulmonary:     Effort: Pulmonary effort is normal.     Breath sounds: Normal breath sounds.  Abdominal:     Palpations: Abdomen is soft.     Tenderness: There is generalized abdominal tenderness. There is guarding.  Skin:    General: Skin is warm and dry.     Findings:  No erythema or rash.  Neurological:     Mental Status: He is alert.     GCS: GCS eye subscore is 4. GCS verbal subscore is 4. GCS motor subscore is 6.  Psychiatric:        Mood and Affect: Mood and affect normal.        Behavior: Behavior normal.     ED Results / Procedures / Treatments   Labs (all labs ordered are listed, but only abnormal results are displayed) Labs Reviewed  COMPREHENSIVE METABOLIC PANEL - Abnormal; Notable for the following components:      Result Value   Potassium 3.4 (*)    Glucose, Bld 102 (*)    AST 14 (*)    All other components within normal limits  URINALYSIS, ROUTINE W REFLEX MICROSCOPIC - Abnormal; Notable for the following components:   APPearance HAZY (*)    Protein, ur 30 (*)    Bacteria, UA RARE (*)    All other components within normal limits  LIPASE, BLOOD  CBC    EKG None  Radiology CT Abdomen Pelvis W Contrast  Result Date: 12/26/2019 CLINICAL DATA:  Abdominal pain EXAM: CT ABDOMEN AND PELVIS WITH CONTRAST TECHNIQUE: Multidetector CT imaging of the abdomen and pelvis was performed using the standard protocol following bolus administration of intravenous contrast. CONTRAST:  111mL OMNIPAQUE IOHEXOL 300 MG/ML  SOLN COMPARISON:  12/06/2018 FINDINGS: Lower chest: No acute abnormality. Hepatobiliary: Small hypodensity  in the left hepatic lobe is stable compatible with cyst. Small calcification along the superior gallbladder wall, non dependent. This could reflect adherent stone or punctate gallbladder wall calcification. No wall thickening or biliary ductal dilatation. Pancreas: No focal abnormality or ductal dilatation. Spleen: No focal abnormality.  Normal size. Adrenals/Urinary Tract: In No adrenal abnormality. No focal renal abnormality. No stones or hydronephrosis. Urinary bladder is unremarkable. Small cyst in the midpole of the left kidney is stable since prior study. Stomach/Bowel: Moderate stool burden throughout the colon. Normal appendix. Stomach, large and small bowel grossly unremarkable. Vascular/Lymphatic: No evidence of aneurysm or adenopathy. Reproductive: No visible focal abnormality. Other: No free fluid or free air. There is haziness within the intraperitoneal fat laterally in, anterior to the descending colon. This is new since prior study and could reflect fat necrosis. Musculoskeletal: No acute bony abnormality. Postoperative changes in the lower lumbar spine. IMPRESSION: Punctate non dependent calcification along the superior gallbladder wall could reflect a 1-2 mm adherent stone or punctate wall calcification. No other evidence for gallbladder disease. Moderate stool in the colon. Areas of haziness/stranding in the left lateral intraperitoneal fat anterior to the descending colon. This is new since prior study. This may reflect areas of fat necrosis. This could be followed with repeat noncontrast CT in 6 months to ensure stability. Electronically Signed   By: Rolm Baptise M.D.   On: 12/26/2019 19:13    Procedures Procedures (including critical care time)  Medications Ordered in ED Medications  iohexol (OMNIPAQUE) 300 MG/ML solution 100 mL (100 mLs Intravenous Contrast Given 12/26/19 1832)    ED Course  I have reviewed the triage vital signs and the nursing notes.  Pertinent labs & imaging  results that were available during my care of the patient were reviewed by me and considered in my medical decision making (see chart for details).  Clinical Course as of 12/26/19 1930  Wed Dec 15, 552  661 69 year old male with history of dementia presents with complaint of abdominal pain. Patient is well-appearing, found to have  generalized abdominal tenderness with guarding. Lab work without significant findings including CBC, CMP, lipase and urinalysis. CT with possible fat necrosis left lower abdomen, recommends consideration for repeat Noncon CT in 6 months. Also found to have moderate stool burden. Discussed results with patient, recommend Colace and MiraLAX daily as needed. Recommend recheck with PCP in 2 days, return to ER for worsening or concerning symptoms. [LM]    Clinical Course User Index [LM] Roque Lias   MDM Rules/Calculators/A&P                          Final Clinical Impression(s) / ED Diagnoses Final diagnoses:  Generalized abdominal pain  Constipation, unspecified constipation type    Rx / DC Orders ED Discharge Orders         Ordered    docusate sodium (COLACE) 100 MG capsule  Daily PRN        12/26/19 1927    polyethylene glycol powder (MIRALAX) 17 GM/SCOOP powder  As needed        12/26/19 1927           Tacy Learn, PA-C 12/26/19 1930    Long, Wonda Old[facility], MD 01/01/20 [phone]

## 2020-01-07 ENCOUNTER — Encounter (HOSPITAL_COMMUNITY): Payer: Self-pay | Admitting: Emergency Medicine

## 2020-01-07 ENCOUNTER — Other Ambulatory Visit: Payer: Self-pay

## 2020-01-07 ENCOUNTER — Emergency Department (HOSPITAL_COMMUNITY)
Admission: EM | Admit: 2020-01-07 | Discharge: 2020-01-07 | Disposition: A | Discharge status: Home / Self Care | Payer: Medicare Other | Attending: Emergency Medicine | Admitting: Emergency Medicine

## 2020-01-07 DIAGNOSIS — R109 Unspecified abdominal pain: Secondary | ICD-10-CM | POA: Diagnosis not present

## 2020-01-07 DIAGNOSIS — R1031 Right lower quadrant pain: Secondary | ICD-10-CM | POA: Diagnosis not present

## 2020-01-07 DIAGNOSIS — Z5321 Procedure and treatment not carried out due to patient leaving prior to being seen by health care provider: Secondary | ICD-10-CM | POA: Diagnosis not present

## 2020-01-07 DIAGNOSIS — R1032 Left lower quadrant pain: Secondary | ICD-10-CM | POA: Diagnosis not present

## 2020-01-07 HISTORY — DX: Unspecified dementia, unspecified severity, without behavioral disturbance, psychotic disturbance, mood disturbance, and anxiety: F03.90

## 2020-01-07 NOTE — ED Triage Notes (Signed)
Pt c/o abdominal pain "for a long time"  Pt states he is unable to eat.  Denies N/V/D/F

## 2020-01-07 NOTE — ED Notes (Signed)
Daughter Truddie Coco [phone] call with update

## 2020-01-08 ENCOUNTER — Other Ambulatory Visit: Payer: Self-pay | Admitting: Urology

## 2020-01-08 ENCOUNTER — Encounter (HOSPITAL_COMMUNITY): Payer: Self-pay

## 2020-01-08 ENCOUNTER — Emergency Department (HOSPITAL_COMMUNITY)
Admission: EM | Admit: 2020-01-08 | Discharge: 2020-01-08 | Disposition: A | Discharge status: Home / Self Care | Payer: Medicare Other | Attending: Emergency Medicine | Admitting: Emergency Medicine

## 2020-01-08 DIAGNOSIS — R109 Unspecified abdominal pain: Secondary | ICD-10-CM | POA: Insufficient documentation

## 2020-01-08 DIAGNOSIS — I10 Essential (primary) hypertension: Secondary | ICD-10-CM | POA: Diagnosis not present

## 2020-01-08 DIAGNOSIS — J449 Chronic obstructive pulmonary disease, unspecified: Secondary | ICD-10-CM | POA: Insufficient documentation

## 2020-01-08 DIAGNOSIS — Z79899 Other long term (current) drug therapy: Secondary | ICD-10-CM | POA: Diagnosis not present

## 2020-01-08 DIAGNOSIS — G8929 Other chronic pain: Secondary | ICD-10-CM | POA: Diagnosis not present

## 2020-01-08 DIAGNOSIS — R630 Anorexia: Secondary | ICD-10-CM | POA: Insufficient documentation

## 2020-01-08 DIAGNOSIS — R1084 Generalized abdominal pain: Secondary | ICD-10-CM | POA: Diagnosis not present

## 2020-01-08 DIAGNOSIS — Z85048 Personal history of other malignant neoplasm of rectum, rectosigmoid junction, and anus: Secondary | ICD-10-CM | POA: Diagnosis not present

## 2020-01-08 DIAGNOSIS — K219 Gastro-esophageal reflux disease without esophagitis: Secondary | ICD-10-CM | POA: Insufficient documentation

## 2020-01-08 DIAGNOSIS — D3000 Benign neoplasm of unspecified kidney: Secondary | ICD-10-CM

## 2020-01-08 DIAGNOSIS — Z87891 Personal history of nicotine dependence: Secondary | ICD-10-CM | POA: Insufficient documentation

## 2020-01-08 LAB — COMPREHENSIVE METABOLIC PANEL
ALT: 11 U/L (ref 0–44)
AST: 17 U/L (ref 15–41)
Albumin: 3.6 g/dL (ref 3.5–5.0)
Alkaline Phosphatase: 86 U/L (ref 38–126)
Anion gap: 12 (ref 5–15)
BUN: 10 mg/dL (ref 8–23)
CO2: 24 mmol/L (ref 22–32)
Calcium: 9.3 mg/dL (ref 8.9–10.3)
Chloride: 102 mmol/L (ref 98–111)
Creatinine, Ser: 1.04 mg/dL (ref 0.61–1.24)
GFR, Estimated: >60 mL/min (ref >60)
Glucose, Bld: 103 mg/dL — ABNORMAL HIGH (ref 70–99)
Potassium: 3.3 mmol/L — ABNORMAL LOW (ref 3.5–5.1)
Sodium: 138 mmol/L (ref 135–145)
Total Bilirubin: 0.7 mg/dL (ref 0.3–1.2)
Total Protein: 6.8 g/dL (ref 6.5–8.1)

## 2020-01-08 LAB — CBC
HCT: 45.3 % (ref 39.0–52.0)
Hemoglobin: 15 g/dL (ref 13.0–17.0)
MCH: 28.6 pg (ref 26.0–34.0)
MCHC: 33.1 g/dL (ref 30.0–36.0)
MCV: 86.5 fL (ref 80.0–100.0)
Platelets: 314 10*3/uL (ref 150–400)
RBC: 5.24 MIL/uL (ref 4.22–5.81)
RDW: 12.3 % (ref 11.5–15.5)
WBC: 7 10*3/uL (ref 4.0–10.5)
nRBC: 0 % (ref 0.0–0.2)

## 2020-01-08 LAB — URINALYSIS, ROUTINE W REFLEX MICROSCOPIC
Bilirubin Urine: NEGATIVE
Glucose, UA: NEGATIVE mg/dL
Hgb urine dipstick: NEGATIVE
Ketones, ur: NEGATIVE mg/dL
Leukocytes,Ua: NEGATIVE
Nitrite: NEGATIVE
Protein, ur: NEGATIVE mg/dL
Specific Gravity, Urine: 1.013 (ref 1.005–1.030)
pH: 6 (ref 5.0–8.0)

## 2020-01-08 LAB — LIPASE, BLOOD: Lipase: 32 U/L (ref 11–51)

## 2020-01-08 LAB — POC OCCULT BLOOD, ED: Fecal Occult Bld: NEGATIVE

## 2020-01-08 MED ORDER — SODIUM CHLORIDE 0.9 % IV BOLUS
1000.0000 mL | Freq: Once | INTRAVENOUS | Status: AC
  Administered 2020-01-08: 12:00:00 1000 mL via INTRAVENOUS

## 2020-01-08 MED ORDER — FAMOTIDINE 20 MG PO TABS: 20.0000 mg | ORAL_TABLET | Freq: Two times a day (BID) | ORAL | 0 refills | Status: DC

## 2020-01-08 MED ORDER — ALUM & MAG HYDROXIDE-SIMETH 200-200-20 MG/5ML PO SUSP
30.0000 mL | Freq: Once | ORAL | Status: AC
  Administered 2020-01-08: 10:00:00 30 mL via ORAL
  Filled 2020-01-08: qty 30

## 2020-01-08 NOTE — ED Provider Notes (Signed)
[facility] EMERGENCY DEPARTMENT Provider Note   CSN: [id number] Arrival date & time: 01/08/20  0455     History Chief Complaint  Patient presents with   Abdominal Pain    Todd Berg is a 69 y.o. male with a past medical history of GERD, hypertension, prior GI bleed, presenting to the ED with a chief complaint of abdominal pain.  States that he has had persistent generalized abdominal pain and decreased appetite.  Patient somewhat poor historian and really does not give me a straightforward answer to any of my questions.  States that he has had about 5 visits to various providers for this pain.  I do know if anyone has ever given him a formal diagnosis.  He states that he continues to have the pain today as well as decreased appetite which prompted his visit to the ER again today.  He denies any dysuria, vomiting, diarrhea or constipation, bloody stools, sick contacts with similar symptoms.  States that his daughter has been giving him "some kind of pill" to help with his symptoms.  He does not know the name of it.  No prior abdominal surgeries.  HPI     Past Medical History:  Diagnosis Date   Cancer ([place]) 2015   rectal   Chronic back pain    COPD (chronic obstructive pulmonary disease) (HCC)    Dementia (HCC)    GERD (gastroesophageal reflux disease)    Hematemesis    Hemorrhoids    Hyperlipidemia    Hypotension    Melena    Melena 10/12/2017   Stroke ([place]) 2019    Patient Active Problem List   Diagnosis Date Noted   Memory changes 11/30/2018   H/O: CVA (cerebrovascular accident) 11/20/2018   GI bleed 11/20/2018   Upper GI bleed    Melena 11/19/2018   Infestation by bed bug 11/14/2018   Gallstones 11/14/2018   Vitamin B12 deficiency 08/03/2018   Numbness and tingling of both legs 08/03/2018   Anemia 10/10/2017   Fatigue 10/10/2017   Hypertension 03/21/2017   Generalized anxiety disorder 03/21/2017   Degenerative  disc disease, lumbar 03/17/2017   Iron deficiency anemia due to chronic blood loss 11/20/2016   Chronic back pain    COPD (chronic obstructive pulmonary disease) (HCC)    GERD (gastroesophageal reflux disease)    History of rectal cancer 09/13/2015    Past Surgical History:  Procedure Laterality Date   BACK SURGERY     BIOPSY  10/13/2017   Procedure: BIOPSY;  Surgeon: Laurence Spates, MD;  Location: [facility];  Service: Endoscopy;;   BIOPSY  11/20/2018   Procedure: BIOPSY;  Surgeon: Daneil Dolin, MD;  Location: [facility];  Service: Endoscopy;;   COLONOSCOPY  08/2015   Dr. [name]: normal   ESOPHAGEAL DILATION  11/20/2018   Procedure: ESOPHAGEAL DILATION;  Surgeon: Daneil Dolin, MD;  Location: [facility];  Service: Endoscopy;;   ESOPHAGOGASTRODUODENOSCOPY N/A 06/09/2016   Dr. Wilford Corner: LA Grade A reflux esophagitis, non-bleeding gastric ulcers s/p epi. Large amount of old blood in dependent portions of stomach. H.pylori serology: negative.    ESOPHAGOGASTRODUODENOSCOPY (EGD) WITH PROPOFOL Left 11/21/2016   Dr. Paulita Fujita: small hiatal hernia, benign-appearing esophageal stenosis, non-bleeding gastric ulcer without stigmata of bleeding. 6 mm in largest dimension. Gastritis. Normal duodenum. Felt to be NSAID-induced.    ESOPHAGOGASTRODUODENOSCOPY (EGD) WITH PROPOFOL N/A 10/13/2017   Dr. Oletta Lamas: large hiatal hernia, one cratered gastric ulcer in preplyoric region of stomach, 8 mm  in largest dimension s/p biopsy. Negative H.pylori   ESOPHAGOGASTRODUODENOSCOPY (EGD) WITH PROPOFOL N/A 11/20/2018   Procedure: ESOPHAGOGASTRODUODENOSCOPY (EGD) WITH PROPOFOL;  Surgeon: Daneil Dolin, MD;  [facility];  Service: Endoscopy;  Laterality: N/A;   HEMORRHOID SURGERY     HERNIA REPAIR     RECTAL SURGERY         Family History  Problem Relation Age of Onset   COPD Mother    Obesity Mother    Stroke Father     Social History   Tobacco Use    Smoking status: Former Smoker    Start date: 12/30/1964    Quit date: 06/19/2009    Years since quitting: 10.5   Smokeless tobacco: Former Systems developer    Types: Snuff    Quit date: 09/26/2005  Vaping Use   Vaping Use: Never used  Substance Use Topics   Alcohol use: No   Drug use: No    Home Medications Prior to Admission medications   Medication Sig Start Date End Date Taking? Authorizing Provider  amLODipine (NORVASC) 2.5 MG tablet TAKE 1 TABLET ONCE DIALY Patient taking differently: Take 2.5 mg by mouth daily. 11/06/19  Yes Dettinger, [name], MD  cetirizine (ZYRTEC) 10 MG tablet Take 1 tablet (10 mg total) by mouth daily. For itching 06/05/19  Yes Hendricks Limes F, FNP  diphenhydramine-acetaminophen (TYLENOL PM) 25-500 MG TABS tablet Take 2 tablets by mouth at bedtime as needed (mild pain).   Yes [provider]  docusate sodium (COLACE) 100 MG capsule Take 1 capsule (100 mg total) by mouth daily as needed for mild constipation. 12/26/19  Yes Tacy Learn, PA-C  famotidine (PEPCID) 20 MG tablet Take 1 tablet (20 mg total) by mouth 2 (two) times daily. 01/08/20  Yes Raden Byington, PA-C  Melatonin 10 MG TBCR Take 1 tablet by mouth every evening.   Yes [provider]  memantine (NAMENDA) 10 MG tablet Take 10 mg by mouth 2 (two) times daily. 12/25/19  Yes [provider]  pantoprazole (PROTONIX) 40 MG tablet Take 1 tablet (40 mg total) by mouth 2 (two) times daily before a meal. 06/05/19  Yes Hendricks Limes F, FNP  polyethylene glycol powder (MIRALAX) 17 GM/SCOOP powder Take 17 g by mouth as needed. Once daily as needed Patient taking differently: Take 17 g by mouth as needed for mild constipation. 12/26/19  Yes Tacy Learn, PA-C  psyllium (METAMUCIL) 58.6 % packet Take 1 packet by mouth daily.   Yes [provider]  Cyanocobalamin (VITAMIN B-12) 5000 MCG SUBL DISSOLVE 1 TABLET UNDER TONGUE DAILY Patient not taking: No sig reported 01/24/19   Dettinger,  [name], MD  memantine [facility] TITRATION PACK) tablet pack Take 1 tablet (10 mg) twice daily. Patient not taking: No sig reported 12/25/19   Garvin Fila, MD  potassium chloride (KLOR-CON) 10 MEQ tablet Take 1 tablet (10 mEq total) by mouth daily. Patient not taking: No sig reported 10/02/19   Dettinger, [name], MD    Allergies    Patient has no known allergies.  Review of Systems   Review of Systems  Constitutional: Negative for appetite change, chills and fever.  HENT: Negative for ear pain, rhinorrhea, sneezing and sore throat.   Eyes: Negative for photophobia and visual disturbance.  Respiratory: Negative for cough, chest tightness, shortness of breath and wheezing.   Cardiovascular: Negative for chest pain and palpitations.  Gastrointestinal: Positive for abdominal pain. Negative for blood in stool, constipation, diarrhea,  nausea and vomiting.  Genitourinary: Negative for dysuria, hematuria and urgency.  Musculoskeletal: Negative for myalgias.  Skin: Negative for rash.  Neurological: Negative for dizziness, weakness and light-headedness.    Physical Exam Updated Vital Signs BP (!) 173/94    Pulse 75    Temp 97.7 F (36.5 C) (Oral)    Resp 18    Ht 5\' 9"  (1.753 m)    Wt 96.7 kg    SpO2 98%    BMI 31.48 kg/m   Physical Exam Vitals and nursing note reviewed. Exam conducted with a chaperone present.  Constitutional:      General: He is not in acute distress.    Appearance: He is well-developed and well-nourished.  HENT:     Head: Normocephalic and atraumatic.     Nose: Nose normal.  Eyes:     General: No scleral icterus.       Left eye: No discharge.     Extraocular Movements: EOM normal.     Conjunctiva/sclera: Conjunctivae normal.  Cardiovascular:     Rate and Rhythm: Normal rate and regular rhythm.     Pulses: Intact distal pulses.     Heart sounds: Normal heart sounds. No murmur heard. No friction rub. No gallop.   Pulmonary:     Effort: Pulmonary effort is  normal. No respiratory distress.     Breath sounds: Normal breath sounds.  Abdominal:     General: Bowel sounds are normal. There is no distension.     Palpations: Abdomen is soft.     Tenderness: There is generalized abdominal tenderness. There is no guarding.  Genitourinary:    Comments: DRE with brown stool.  No tenderness or external abnormalities. Musculoskeletal:        General: No edema. Normal range of motion.     Cervical back: Normal range of motion and neck supple.  Skin:    General: Skin is warm and dry.     Findings: No rash.  Neurological:     Mental Status: He is alert.     Motor: No abnormal muscle tone.     Coordination: Coordination normal.  Psychiatric:        Mood and Affect: Mood and affect normal.     ED Results / Procedures / Treatments   Labs (all labs ordered are listed, but only abnormal results are displayed) Labs Reviewed  COMPREHENSIVE METABOLIC PANEL - Abnormal; Notable for the following components:      Result Value   Potassium 3.3 (*)    Glucose, Bld 103 (*)    All other components within normal limits  URINE CULTURE  LIPASE, BLOOD  CBC  URINALYSIS, ROUTINE W REFLEX MICROSCOPIC  POC OCCULT BLOOD, ED    EKG None  Radiology No results found.  Procedures Procedures (including critical care time)  Medications Ordered in ED Medications  alum & mag hydroxide-simeth (MAALOX/MYLANTA) 200-200-20 MG/5ML suspension 30 mL (30 mLs Oral Given 01/08/20 0934)  sodium chloride 0.9 % bolus 1,000 mL (0 mLs Intravenous Stopped 01/08/20 1248)    ED Course  I have reviewed the triage vital signs and the nursing notes.  Pertinent labs & imaging results that were available during my care of the patient were reviewed by me and considered in my medical decision making (see chart for details).  Clinical Course as of 01/08/20 1502  Tue Jan 08, 2020  5597 Attempted to call daughter Todd Berg listed emergency contact without success [HK]  1014 Additional  history provided by Todd Berg, daughter at  the bedside.  States that he has had this intermittent abdominal pain for the past several months.  He has not seen GI for this.  She does not believe it is due to constipation as he has a bowel movement daily and has been taking the MiraLAX.  He complains of dark and tarry stools yesterday.  He also feels like he has dysuria "my penis feels like it's closing up."  [HK]  1015 Hemoglobin: 15.0 [HK]  1048 Fecal Occult Blood, POC: NEGATIVE [HK]    Clinical Course User Index [HK] Dietrich Pates, PA-C   MDM Rules/Calculators/A&P                          69 year old male presenting to the ED for continued abdominal pain.  He is a poor historian and really does not answer my questions straightforward.  He states that he has had this generalized abdominal pain and decreased appetite for several months with several visits to various providers.  He does not know if anyone has ever diagnosed him with anything.  Denies any vomiting, diarrhea, constipation, bloody stools, chest pain, shortness of breath or urinary symptoms.  On exam he has generalized tenderness without any focal tenderness, rebound or guarding.  Chart review shows that he had an ED visit about 2 weeks ago for similar symptoms.  His CT scan of his abdomen at the time showed moderate stool burden, possible fat necrosis (recommend repeat CT in 6 months).  He was prescribed MiraLAX.  He is unsure if he is taking this.  Labs today show unremarkable CBC, CMP, lipase.  Will obtain urinalysis, give GI cocktail and reassess.  Urinalysis without evidence of infection.  Daughter was concerned about dark and tarry stools.  DRE with brown stool which is Hemoccult negative.  He reports significant improvement with GI cocktail and IV fluids.  I am unsure exactly what is causing his symptoms but with his reassuring work-up 2 weeks ago as well as his work-up today feel that he will benefit from GI follow-up.  I do not see any  need to reimage at this time as his symptoms are essentially unchanged.  I informed his daughter of this as well.  She is agreeable to following up with GI.  In the meantime she is requesting medication to help with the symptoms.  Will prescribe Pepcid in case this is gastritis.  Return precautions given.   Patient is hemodynamically stable, in NAD, and able to ambulate in the ED. Evaluation does not show pathology that would require ongoing emergent intervention or inpatient treatment. I explained the diagnosis to the patient. Pain has been managed and has no complaints prior to discharge. Patient is comfortable with above plan and is stable for discharge at this time. All questions were answered prior to disposition. Strict return precautions for returning to the ED were discussed. Encouraged follow up with PCP.   An After Visit Summary was printed and given to the patient.   Portions of this note were generated with Scientist, clinical (histocompatibility and immunogenetics). Dictation errors may occur despite best attempts at proofreading.  Final Clinical Impression(s) / ED Diagnoses Final diagnoses:  Chronic abdominal pain    Rx / DC Orders ED Discharge Orders         Ordered    famotidine (PEPCID) 20 MG tablet  2 times daily        01/08/20 1459           Breea Loncar, [place],  PA-C 01/08/20 1502    Pattricia Boss, MD 01/09/20 1004

## 2020-01-08 NOTE — Discharge Instructions (Signed)
Take the medications as needed to help with pain. Follow-up with the GI specialist listed below for further evaluation. Return to the ER if you start to experience worsening abdominal pain, chest pain, shortness of breath or bloody stools.

## 2020-01-08 NOTE — ED Triage Notes (Signed)
Pt states that he has been having generalized abd pain for a long time and feels like something "busted in him" denies n/v/d.

## 2020-01-09 LAB — URINE CULTURE: Culture: NO GROWTH

## 2020-01-13 DIAGNOSIS — R1084 Generalized abdominal pain: Secondary | ICD-10-CM | POA: Diagnosis not present

## 2020-01-17 ENCOUNTER — Ambulatory Visit (INDEPENDENT_AMBULATORY_CARE_PROVIDER_SITE_OTHER): Payer: Medicare Other | Admitting: Family Medicine

## 2020-01-17 ENCOUNTER — Other Ambulatory Visit: Payer: Self-pay

## 2020-01-17 ENCOUNTER — Encounter: Payer: Self-pay | Admitting: Family Medicine

## 2020-01-17 ENCOUNTER — Ambulatory Visit (INDEPENDENT_AMBULATORY_CARE_PROVIDER_SITE_OTHER): Payer: Medicare Other

## 2020-01-17 VITALS — BP 151/82 | HR 71 | Ht 69.0 in | Wt 210.0 lb

## 2020-01-17 DIAGNOSIS — R1032 Left lower quadrant pain: Secondary | ICD-10-CM

## 2020-01-17 DIAGNOSIS — G4709 Other insomnia: Secondary | ICD-10-CM

## 2020-01-17 DIAGNOSIS — Z23 Encounter for immunization: Secondary | ICD-10-CM | POA: Diagnosis not present

## 2020-01-17 DIAGNOSIS — R109 Unspecified abdominal pain: Secondary | ICD-10-CM | POA: Diagnosis not present

## 2020-01-17 DIAGNOSIS — Z1159 Encounter for screening for other viral diseases: Secondary | ICD-10-CM

## 2020-01-17 MED ORDER — TRAZODONE HCL 50 MG PO TABS
25.0000 mg | ORAL_TABLET | Freq: Every evening | ORAL | 3 refills | Status: DC | PRN
Start: 2020-01-17 — End: 2020-02-11

## 2020-01-17 NOTE — Progress Notes (Signed)
BP (!) 151/82   Pulse 71   Ht 5\' 9"  (1.753 m)   Wt 210 lb (95.3 kg)   SpO2 98%   BMI 31.01 kg/m    Subjective:   Patient ID: Todd Berg, male    DOB: August 27, 1950, 70 y.o.   MRN: [id number]  HPI: Todd Berg is a 70 y.o. male presenting on 01/17/2020 for Insomnia   HPI Patient is coming in today with complaints of left lower quadrant abdominal pain and early satiety and pain increased with eating.  He has not had any nausea or vomiting.  His daughter who is here with him gives a lot of the history because he does have some mild dementia.  She said that they have been told that he has been constipated for and started to the MiraLAX and did some but did not sit it was improved then although when he was on the MiraLAX he was having bowel movements every day and now is back to having bowel movements every 2 to 3 days.  They deny any blood in his stool or in vomiting  Patient is also complaining of difficulty sleeping.  They did go see a neurologist who did diagnose him with dementia that is gradually worsening memory changes and now he is sundowning and agitated and not sleeping well especially since his wife passed away a month and a half ago from [place] and his daughter says that since then he is really been a lot more agitated and not sleeping well at all.  Relevant past medical, surgical, family and social history reviewed and updated as indicated. Interim medical history since our last visit reviewed. Allergies and medications reviewed and updated.  Review of Systems  Constitutional: Negative for chills and fever.  Eyes: Negative for visual disturbance.  Respiratory: Negative for shortness of breath and wheezing.   Cardiovascular: Negative for chest pain and leg swelling.  Musculoskeletal: Negative for back pain and gait problem.  Skin: Negative for rash.  Neurological: Negative for dizziness, weakness and light-headedness.  All other systems reviewed and are  negative.   Per HPI unless specifically indicated above   Allergies as of 01/17/2020   No Known Allergies     Medication List       Accurate as of January 17, 2020  1:52 PM. If you have any questions, ask your nurse or doctor.        amLODipine 2.5 MG tablet Commonly known as: NORVASC TAKE 1 TABLET ONCE DIALY What changed: See the new instructions.   cetirizine 10 MG tablet Commonly known as: ZYRTEC Take 1 tablet (10 mg total) by mouth daily. For itching   diphenhydramine-acetaminophen 25-500 MG Tabs tablet Commonly known as: TYLENOL PM Take 2 tablets by mouth at bedtime as needed (mild pain).   docusate sodium 100 MG capsule Commonly known as: COLACE Take 1 capsule (100 mg total) by mouth daily as needed for mild constipation.   famotidine 20 MG tablet Commonly known as: PEPCID Take 1 tablet (20 mg total) by mouth 2 (two) times daily.   Melatonin 10 MG Tbcr Take 1 tablet by mouth every evening.   memantine tablet pack Commonly known as: NAMENDA TITRATION PACK Take 1 tablet (10 mg) twice daily.   memantine 10 MG tablet Commonly known as: NAMENDA Take 10 mg by mouth 2 (two) times daily.   pantoprazole 40 MG tablet Commonly known as: PROTONIX Take 1 tablet (40 mg total) by mouth 2 (two) times daily before a  meal.   polyethylene glycol powder 17 GM/SCOOP powder Commonly known as: MiraLax Take 17 g by mouth as needed. Once daily as needed What changed:   reasons to take this  additional instructions   potassium chloride 10 MEQ tablet Commonly known as: KLOR-CON Take 1 tablet (10 mEq total) by mouth daily.   psyllium 58.6 % packet Commonly known as: METAMUCIL Take 1 packet by mouth daily.   traZODone 50 MG tablet Commonly known as: DESYREL Take 0.5-1 tablets (25-50 mg total) by mouth at bedtime as needed for sleep. Started by: Nils Pyle, MD   Vitamin B-12 5000 MCG Subl DISSOLVE 1 TABLET UNDER TONGUE DAILY        Objective:   BP (!)  151/82   Pulse 71   Ht 5\' 9"  (1.753 m)   Wt 210 lb (95.3 kg)   SpO2 98%   BMI 31.01 kg/m   Wt Readings from Last 3 Encounters:  01/17/20 210 lb (95.3 kg)  01/08/20 213 lb 3 oz (96.7 kg)  12/26/19 213 lb 3.2 oz (96.7 kg)    Physical Exam Vitals and nursing note reviewed.  Constitutional:      General: He is not in acute distress.    Appearance: He is well-developed and well-nourished. He is not diaphoretic.  Eyes:     General: No scleral icterus.    Extraocular Movements: EOM normal.     Conjunctiva/sclera: Conjunctivae normal.  Neck:     Thyroid: No thyromegaly.  Cardiovascular:     Rate and Rhythm: Normal rate and regular rhythm.     Pulses: Intact distal pulses.     Heart sounds: Normal heart sounds. No murmur heard.   Pulmonary:     Effort: Pulmonary effort is normal. No respiratory distress.     Breath sounds: Normal breath sounds. No wheezing.  Musculoskeletal:        General: No edema. Normal range of motion.     Cervical back: Neck supple.  Lymphadenopathy:     Cervical: No cervical adenopathy.  Skin:    General: Skin is warm and dry.     Findings: No rash.  Neurological:     Mental Status: He is alert and oriented to person, place, and time.     Coordination: Coordination normal.  Psychiatric:        Mood and Affect: Mood and affect normal.        Behavior: Behavior normal.     KUB: full is full of stool  Assessment & Plan:   Problem List Items Addressed This Visit   None   Visit Diagnoses    Left lower quadrant abdominal pain    -  Primary   Relevant Orders   DG Abd 1 View   Encounter for hepatitis C screening test for low risk patient       Other insomnia          Will start trazodone for insomnia, will see what the KUB shows but it sounds like he is still constipated and may need to increase to MiraLAX twice a day to clear him out. Follow up plan: Return in about 3 months (around 04/16/2020), or if symptoms worsen or fail to improve, for GERD  and insomnia recheck.  And hypertension.  Counseling provided for all of the vaccine components Orders Placed This Encounter  Procedures  . DG Abd 1 View    06/16/2020, MD [facility] 01/17/2020, 1:52 PM

## 2020-01-19 DIAGNOSIS — I700 Atherosclerosis of aorta: Principal | ICD-10-CM

## 2020-01-19 DIAGNOSIS — I119 Hypertensive heart disease without heart failure: Principal | ICD-10-CM

## 2020-01-19 DIAGNOSIS — K219 Gastro-esophageal reflux disease without esophagitis: Principal | ICD-10-CM

## 2020-01-19 DIAGNOSIS — Z79899 Other long term (current) drug therapy: Principal | ICD-10-CM

## 2020-01-19 DIAGNOSIS — K56609 Unspecified intestinal obstruction, unspecified as to partial versus complete obstruction: Principal | ICD-10-CM

## 2020-01-19 DIAGNOSIS — Z20822 Contact with and (suspected) exposure to covid-19: Principal | ICD-10-CM

## 2020-01-19 DIAGNOSIS — K449 Diaphragmatic hernia without obstruction or gangrene: Principal | ICD-10-CM

## 2020-01-19 DIAGNOSIS — R109 Unspecified abdominal pain: Principal | ICD-10-CM

## 2020-01-19 DIAGNOSIS — K6389 Other specified diseases of intestine: Secondary | ICD-10-CM | POA: Diagnosis not present

## 2020-01-19 DIAGNOSIS — Z4682 Encounter for fitting and adjustment of non-vascular catheter: Secondary | ICD-10-CM | POA: Diagnosis not present

## 2020-01-20 ENCOUNTER — Encounter
Admit: 2020-01-20 | Discharge: 2020-01-20 | Disposition: A | Discharge status: Other Acute Inpatient Hospital | Payer: MEDICARE

## 2020-01-20 ENCOUNTER — Emergency Department
Admit: 2020-01-20 | Discharge: 2020-01-20 | Disposition: A | Discharge status: Other Acute Inpatient Hospital | Payer: MEDICARE

## 2020-01-20 ENCOUNTER — Encounter
Admit: 2020-01-20 | Discharge: 2020-02-01 | Disposition: A | Discharge status: Home Health | Payer: MEDICARE | Source: Other Acute Inpatient Hospital

## 2020-01-20 ENCOUNTER — Ambulatory Visit
Admit: 2020-01-20 | Discharge: 2020-02-01 | Disposition: A | Discharge status: Home Health | Payer: MEDICARE | Source: Other Acute Inpatient Hospital

## 2020-01-20 ENCOUNTER — Encounter
Admit: 2020-01-20 | Discharge: 2020-02-01 | Disposition: A | Discharge status: Home Health | Payer: MEDICARE | Source: Other Acute Inpatient Hospital | Attending: Student in an Organized Health Care Education/Training Program

## 2020-01-20 DIAGNOSIS — Z8701 Personal history of pneumonia (recurrent): Principal | ICD-10-CM

## 2020-01-20 DIAGNOSIS — Z20822 Contact with and (suspected) exposure to covid-19: Principal | ICD-10-CM

## 2020-01-20 DIAGNOSIS — C187 Malignant neoplasm of sigmoid colon: Principal | ICD-10-CM

## 2020-01-20 DIAGNOSIS — Z8673 Personal history of transient ischemic attack (TIA), and cerebral infarction without residual deficits: Principal | ICD-10-CM

## 2020-01-20 DIAGNOSIS — K219 Gastro-esophageal reflux disease without esophagitis: Principal | ICD-10-CM

## 2020-01-20 DIAGNOSIS — E876 Hypokalemia: Principal | ICD-10-CM

## 2020-01-20 DIAGNOSIS — Z8616 Personal history of COVID-19: Principal | ICD-10-CM

## 2020-01-20 DIAGNOSIS — I10 Essential (primary) hypertension: Principal | ICD-10-CM

## 2020-01-20 DIAGNOSIS — Z79899 Other long term (current) drug therapy: Principal | ICD-10-CM

## 2020-01-20 DIAGNOSIS — C772 Secondary and unspecified malignant neoplasm of intra-abdominal lymph nodes: Principal | ICD-10-CM

## 2020-01-20 DIAGNOSIS — K56609 Unspecified intestinal obstruction, unspecified as to partial versus complete obstruction: Principal | ICD-10-CM

## 2020-01-20 DIAGNOSIS — K449 Diaphragmatic hernia without obstruction or gangrene: Principal | ICD-10-CM

## 2020-01-20 DIAGNOSIS — R109 Unspecified abdominal pain: Principal | ICD-10-CM

## 2020-01-20 DIAGNOSIS — I700 Atherosclerosis of aorta: Principal | ICD-10-CM

## 2020-01-20 DIAGNOSIS — I119 Hypertensive heart disease without heart failure: Principal | ICD-10-CM

## 2020-01-20 DIAGNOSIS — R410 Disorientation, unspecified: Secondary | ICD-10-CM | POA: Diagnosis not present

## 2020-01-20 DIAGNOSIS — M79604 Pain in right leg: Secondary | ICD-10-CM | POA: Diagnosis not present

## 2020-01-20 DIAGNOSIS — R911 Solitary pulmonary nodule: Secondary | ICD-10-CM | POA: Diagnosis not present

## 2020-01-20 DIAGNOSIS — Z4682 Encounter for fitting and adjustment of non-vascular catheter: Secondary | ICD-10-CM | POA: Diagnosis not present

## 2020-01-20 DIAGNOSIS — K56699 Other intestinal obstruction unspecified as to partial versus complete obstruction: Secondary | ICD-10-CM | POA: Diagnosis not present

## 2020-01-20 DIAGNOSIS — R140 Abdominal distension (gaseous): Secondary | ICD-10-CM | POA: Diagnosis not present

## 2020-01-20 DIAGNOSIS — Z452 Encounter for adjustment and management of vascular access device: Secondary | ICD-10-CM | POA: Diagnosis not present

## 2020-01-20 DIAGNOSIS — K5900 Constipation, unspecified: Secondary | ICD-10-CM | POA: Diagnosis not present

## 2020-01-20 DIAGNOSIS — K6389 Other specified diseases of intestine: Secondary | ICD-10-CM | POA: Diagnosis not present

## 2020-01-20 DIAGNOSIS — Z8719 Personal history of other diseases of the digestive system: Secondary | ICD-10-CM | POA: Diagnosis not present

## 2020-01-20 DIAGNOSIS — M79605 Pain in left leg: Secondary | ICD-10-CM | POA: Diagnosis not present

## 2020-01-21 DIAGNOSIS — C772 Secondary and unspecified malignant neoplasm of intra-abdominal lymph nodes: Principal | ICD-10-CM

## 2020-01-21 DIAGNOSIS — E876 Hypokalemia: Principal | ICD-10-CM

## 2020-01-21 DIAGNOSIS — Z8673 Personal history of transient ischemic attack (TIA), and cerebral infarction without residual deficits: Principal | ICD-10-CM

## 2020-01-21 DIAGNOSIS — Z8616 Personal history of COVID-19: Principal | ICD-10-CM

## 2020-01-21 DIAGNOSIS — C187 Malignant neoplasm of sigmoid colon: Principal | ICD-10-CM

## 2020-01-21 DIAGNOSIS — K219 Gastro-esophageal reflux disease without esophagitis: Principal | ICD-10-CM

## 2020-01-21 DIAGNOSIS — Z8701 Personal history of pneumonia (recurrent): Principal | ICD-10-CM

## 2020-01-21 DIAGNOSIS — I10 Essential (primary) hypertension: Principal | ICD-10-CM

## 2020-01-21 DIAGNOSIS — R410 Disorientation, unspecified: Secondary | ICD-10-CM | POA: Diagnosis not present

## 2020-01-21 DIAGNOSIS — K56609 Unspecified intestinal obstruction, unspecified as to partial versus complete obstruction: Secondary | ICD-10-CM | POA: Diagnosis not present

## 2020-01-21 DIAGNOSIS — R911 Solitary pulmonary nodule: Secondary | ICD-10-CM | POA: Diagnosis not present

## 2020-01-22 DIAGNOSIS — C772 Secondary and unspecified malignant neoplasm of intra-abdominal lymph nodes: Principal | ICD-10-CM

## 2020-01-22 DIAGNOSIS — Z8701 Personal history of pneumonia (recurrent): Principal | ICD-10-CM

## 2020-01-22 DIAGNOSIS — E876 Hypokalemia: Principal | ICD-10-CM

## 2020-01-22 DIAGNOSIS — I10 Essential (primary) hypertension: Principal | ICD-10-CM

## 2020-01-22 DIAGNOSIS — Z8616 Personal history of COVID-19: Principal | ICD-10-CM

## 2020-01-22 DIAGNOSIS — K219 Gastro-esophageal reflux disease without esophagitis: Principal | ICD-10-CM

## 2020-01-22 DIAGNOSIS — C187 Malignant neoplasm of sigmoid colon: Principal | ICD-10-CM

## 2020-01-22 DIAGNOSIS — Z8673 Personal history of transient ischemic attack (TIA), and cerebral infarction without residual deficits: Principal | ICD-10-CM

## 2020-01-22 DIAGNOSIS — K56609 Unspecified intestinal obstruction, unspecified as to partial versus complete obstruction: Secondary | ICD-10-CM | POA: Diagnosis not present

## 2020-01-22 DIAGNOSIS — R410 Disorientation, unspecified: Secondary | ICD-10-CM | POA: Diagnosis not present

## 2020-01-23 DIAGNOSIS — E876 Hypokalemia: Secondary | ICD-10-CM | POA: Diagnosis not present

## 2020-01-23 DIAGNOSIS — K56609 Unspecified intestinal obstruction, unspecified as to partial versus complete obstruction: Secondary | ICD-10-CM | POA: Diagnosis not present

## 2020-01-23 DIAGNOSIS — I10 Essential (primary) hypertension: Secondary | ICD-10-CM | POA: Diagnosis not present

## 2020-01-24 DIAGNOSIS — Z8701 Personal history of pneumonia (recurrent): Principal | ICD-10-CM

## 2020-01-24 DIAGNOSIS — C772 Secondary and unspecified malignant neoplasm of intra-abdominal lymph nodes: Principal | ICD-10-CM

## 2020-01-24 DIAGNOSIS — Z8616 Personal history of COVID-19: Principal | ICD-10-CM

## 2020-01-24 DIAGNOSIS — C187 Malignant neoplasm of sigmoid colon: Principal | ICD-10-CM

## 2020-01-24 DIAGNOSIS — K219 Gastro-esophageal reflux disease without esophagitis: Principal | ICD-10-CM

## 2020-01-24 DIAGNOSIS — E876 Hypokalemia: Principal | ICD-10-CM

## 2020-01-24 DIAGNOSIS — I10 Essential (primary) hypertension: Principal | ICD-10-CM

## 2020-01-24 DIAGNOSIS — Z8673 Personal history of transient ischemic attack (TIA), and cerebral infarction without residual deficits: Principal | ICD-10-CM

## 2020-01-25 DIAGNOSIS — Z8673 Personal history of transient ischemic attack (TIA), and cerebral infarction without residual deficits: Principal | ICD-10-CM

## 2020-01-25 DIAGNOSIS — I10 Essential (primary) hypertension: Principal | ICD-10-CM

## 2020-01-25 DIAGNOSIS — Z8701 Personal history of pneumonia (recurrent): Principal | ICD-10-CM

## 2020-01-25 DIAGNOSIS — C772 Secondary and unspecified malignant neoplasm of intra-abdominal lymph nodes: Principal | ICD-10-CM

## 2020-01-25 DIAGNOSIS — C187 Malignant neoplasm of sigmoid colon: Principal | ICD-10-CM

## 2020-01-25 DIAGNOSIS — E876 Hypokalemia: Principal | ICD-10-CM

## 2020-01-25 DIAGNOSIS — Z8616 Personal history of COVID-19: Principal | ICD-10-CM

## 2020-01-25 DIAGNOSIS — K219 Gastro-esophageal reflux disease without esophagitis: Principal | ICD-10-CM

## 2020-01-25 DIAGNOSIS — K56609 Unspecified intestinal obstruction, unspecified as to partial versus complete obstruction: Secondary | ICD-10-CM | POA: Diagnosis not present

## 2020-01-25 HISTORY — PX: COLON SURGERY: SHX602

## 2020-01-29 DIAGNOSIS — K219 Gastro-esophageal reflux disease without esophagitis: Principal | ICD-10-CM

## 2020-01-29 DIAGNOSIS — I10 Essential (primary) hypertension: Principal | ICD-10-CM

## 2020-01-29 DIAGNOSIS — Z8701 Personal history of pneumonia (recurrent): Principal | ICD-10-CM

## 2020-01-29 DIAGNOSIS — C772 Secondary and unspecified malignant neoplasm of intra-abdominal lymph nodes: Principal | ICD-10-CM

## 2020-01-29 DIAGNOSIS — Z8616 Personal history of COVID-19: Principal | ICD-10-CM

## 2020-01-29 DIAGNOSIS — E876 Hypokalemia: Principal | ICD-10-CM

## 2020-01-29 DIAGNOSIS — C187 Malignant neoplasm of sigmoid colon: Principal | ICD-10-CM

## 2020-01-29 DIAGNOSIS — Z8673 Personal history of transient ischemic attack (TIA), and cerebral infarction without residual deficits: Principal | ICD-10-CM

## 2020-01-29 DIAGNOSIS — K56609 Unspecified intestinal obstruction, unspecified as to partial versus complete obstruction: Secondary | ICD-10-CM | POA: Diagnosis not present

## 2020-01-29 DIAGNOSIS — M79604 Pain in right leg: Secondary | ICD-10-CM | POA: Diagnosis not present

## 2020-01-29 DIAGNOSIS — M79605 Pain in left leg: Secondary | ICD-10-CM | POA: Diagnosis not present

## 2020-02-01 DIAGNOSIS — K56609 Unspecified intestinal obstruction, unspecified as to partial versus complete obstruction: Secondary | ICD-10-CM | POA: Diagnosis not present

## 2020-02-01 MED ORDER — OXYCODONE 5 MG TABLET
ORAL_TABLET | ORAL | 0 refills | 0.00000 days | Status: CP
Start: 2020-02-01 — End: ?
  Filled 2020-02-01: qty 10, 4d supply, fill #0

## 2020-02-01 MED ORDER — AMLODIPINE 2.5 MG TABLET
ORAL_TABLET | Freq: Every day | ORAL | 3 refills | 30.00000 days | Status: CP
Start: 2020-02-01 — End: ?
  Filled 2020-02-01: qty 60, 30d supply, fill #0

## 2020-02-02 MED ORDER — ENOXAPARIN 40 MG/0.4 ML SUBCUTANEOUS SYRINGE
SUBCUTANEOUS | 0 refills | 30.00000 days | Status: CP
Start: 2020-02-02 — End: 2020-03-03
  Filled 2020-02-01: qty 12, 30d supply, fill #0

## 2020-02-04 DIAGNOSIS — C187 Malignant neoplasm of sigmoid colon: Principal | ICD-10-CM

## 2020-02-04 DIAGNOSIS — Z87891 Personal history of nicotine dependence: Secondary | ICD-10-CM | POA: Diagnosis not present

## 2020-02-04 DIAGNOSIS — Z483 Aftercare following surgery for neoplasm: Secondary | ICD-10-CM | POA: Diagnosis not present

## 2020-02-04 DIAGNOSIS — Z8673 Personal history of transient ischemic attack (TIA), and cerebral infarction without residual deficits: Secondary | ICD-10-CM | POA: Diagnosis not present

## 2020-02-04 DIAGNOSIS — K5900 Constipation, unspecified: Secondary | ICD-10-CM | POA: Diagnosis not present

## 2020-02-04 DIAGNOSIS — C779 Secondary and unspecified malignant neoplasm of lymph node, unspecified: Secondary | ICD-10-CM | POA: Diagnosis not present

## 2020-02-04 DIAGNOSIS — E876 Hypokalemia: Secondary | ICD-10-CM | POA: Diagnosis not present

## 2020-02-04 DIAGNOSIS — K219 Gastro-esophageal reflux disease without esophagitis: Secondary | ICD-10-CM | POA: Diagnosis not present

## 2020-02-04 DIAGNOSIS — Z8616 Personal history of COVID-19: Secondary | ICD-10-CM | POA: Diagnosis not present

## 2020-02-04 DIAGNOSIS — I10 Essential (primary) hypertension: Secondary | ICD-10-CM | POA: Diagnosis not present

## 2020-02-04 DIAGNOSIS — Z4801 Encounter for change or removal of surgical wound dressing: Secondary | ICD-10-CM | POA: Diagnosis not present

## 2020-02-05 DIAGNOSIS — Z4801 Encounter for change or removal of surgical wound dressing: Secondary | ICD-10-CM | POA: Diagnosis not present

## 2020-02-05 DIAGNOSIS — I10 Essential (primary) hypertension: Secondary | ICD-10-CM | POA: Diagnosis not present

## 2020-02-05 DIAGNOSIS — K5900 Constipation, unspecified: Secondary | ICD-10-CM | POA: Diagnosis not present

## 2020-02-05 DIAGNOSIS — Z8616 Personal history of COVID-19: Secondary | ICD-10-CM | POA: Diagnosis not present

## 2020-02-05 DIAGNOSIS — C187 Malignant neoplasm of sigmoid colon: Secondary | ICD-10-CM | POA: Diagnosis not present

## 2020-02-05 DIAGNOSIS — Z483 Aftercare following surgery for neoplasm: Secondary | ICD-10-CM | POA: Diagnosis not present

## 2020-02-05 DIAGNOSIS — E876 Hypokalemia: Secondary | ICD-10-CM | POA: Diagnosis not present

## 2020-02-05 DIAGNOSIS — K219 Gastro-esophageal reflux disease without esophagitis: Secondary | ICD-10-CM | POA: Diagnosis not present

## 2020-02-05 DIAGNOSIS — Z8673 Personal history of transient ischemic attack (TIA), and cerebral infarction without residual deficits: Secondary | ICD-10-CM | POA: Diagnosis not present

## 2020-02-05 DIAGNOSIS — C779 Secondary and unspecified malignant neoplasm of lymph node, unspecified: Secondary | ICD-10-CM | POA: Diagnosis not present

## 2020-02-05 DIAGNOSIS — Z87891 Personal history of nicotine dependence: Secondary | ICD-10-CM | POA: Diagnosis not present

## 2020-02-06 ENCOUNTER — Telehealth: Payer: Self-pay | Admitting: Neurology

## 2020-02-06 DIAGNOSIS — Z483 Aftercare following surgery for neoplasm: Secondary | ICD-10-CM | POA: Diagnosis not present

## 2020-02-06 DIAGNOSIS — E876 Hypokalemia: Secondary | ICD-10-CM | POA: Diagnosis not present

## 2020-02-06 DIAGNOSIS — Z4801 Encounter for change or removal of surgical wound dressing: Secondary | ICD-10-CM | POA: Diagnosis not present

## 2020-02-06 DIAGNOSIS — Z87891 Personal history of nicotine dependence: Secondary | ICD-10-CM | POA: Diagnosis not present

## 2020-02-06 DIAGNOSIS — Z8673 Personal history of transient ischemic attack (TIA), and cerebral infarction without residual deficits: Secondary | ICD-10-CM | POA: Diagnosis not present

## 2020-02-06 DIAGNOSIS — Z8616 Personal history of COVID-19: Secondary | ICD-10-CM | POA: Diagnosis not present

## 2020-02-06 DIAGNOSIS — C187 Malignant neoplasm of sigmoid colon: Secondary | ICD-10-CM | POA: Diagnosis not present

## 2020-02-06 DIAGNOSIS — I10 Essential (primary) hypertension: Secondary | ICD-10-CM | POA: Diagnosis not present

## 2020-02-06 DIAGNOSIS — K219 Gastro-esophageal reflux disease without esophagitis: Secondary | ICD-10-CM | POA: Diagnosis not present

## 2020-02-06 DIAGNOSIS — K5900 Constipation, unspecified: Secondary | ICD-10-CM | POA: Diagnosis not present

## 2020-02-06 DIAGNOSIS — C779 Secondary and unspecified malignant neoplasm of lymph node, unspecified: Secondary | ICD-10-CM | POA: Diagnosis not present

## 2020-02-06 NOTE — Telephone Encounter (Signed)
Left message for a return call to the office.

## 2020-02-06 NOTE — Telephone Encounter (Signed)
Was able to offer an earlier appointment with [name].  Daughter accepted and expressed appreciation

## 2020-02-06 NOTE — Telephone Encounter (Signed)
Pt's daughter, Peri Maris ( not on [place]) called, Alzheimer's getting worse, started to throw things, trying drive. Can the nurse work him in any early? Would like a call from the nurse.

## 2020-02-07 DIAGNOSIS — Z8616 Personal history of COVID-19: Secondary | ICD-10-CM | POA: Diagnosis not present

## 2020-02-07 DIAGNOSIS — K5900 Constipation, unspecified: Secondary | ICD-10-CM | POA: Diagnosis not present

## 2020-02-07 DIAGNOSIS — Z8673 Personal history of transient ischemic attack (TIA), and cerebral infarction without residual deficits: Secondary | ICD-10-CM | POA: Diagnosis not present

## 2020-02-07 DIAGNOSIS — Z4801 Encounter for change or removal of surgical wound dressing: Secondary | ICD-10-CM | POA: Diagnosis not present

## 2020-02-07 DIAGNOSIS — Z483 Aftercare following surgery for neoplasm: Secondary | ICD-10-CM | POA: Diagnosis not present

## 2020-02-07 DIAGNOSIS — C779 Secondary and unspecified malignant neoplasm of lymph node, unspecified: Secondary | ICD-10-CM | POA: Diagnosis not present

## 2020-02-07 DIAGNOSIS — K219 Gastro-esophageal reflux disease without esophagitis: Secondary | ICD-10-CM | POA: Diagnosis not present

## 2020-02-07 DIAGNOSIS — Z87891 Personal history of nicotine dependence: Secondary | ICD-10-CM | POA: Diagnosis not present

## 2020-02-07 DIAGNOSIS — E876 Hypokalemia: Secondary | ICD-10-CM | POA: Diagnosis not present

## 2020-02-07 DIAGNOSIS — C187 Malignant neoplasm of sigmoid colon: Secondary | ICD-10-CM | POA: Diagnosis not present

## 2020-02-07 DIAGNOSIS — I10 Essential (primary) hypertension: Secondary | ICD-10-CM | POA: Diagnosis not present

## 2020-02-08 DIAGNOSIS — C187 Malignant neoplasm of sigmoid colon: Secondary | ICD-10-CM | POA: Diagnosis not present

## 2020-02-08 DIAGNOSIS — K5900 Constipation, unspecified: Secondary | ICD-10-CM | POA: Diagnosis not present

## 2020-02-08 DIAGNOSIS — K219 Gastro-esophageal reflux disease without esophagitis: Secondary | ICD-10-CM | POA: Diagnosis not present

## 2020-02-08 DIAGNOSIS — Z87891 Personal history of nicotine dependence: Secondary | ICD-10-CM | POA: Diagnosis not present

## 2020-02-08 DIAGNOSIS — E876 Hypokalemia: Secondary | ICD-10-CM | POA: Diagnosis not present

## 2020-02-08 DIAGNOSIS — Z8673 Personal history of transient ischemic attack (TIA), and cerebral infarction without residual deficits: Secondary | ICD-10-CM | POA: Diagnosis not present

## 2020-02-08 DIAGNOSIS — C779 Secondary and unspecified malignant neoplasm of lymph node, unspecified: Secondary | ICD-10-CM | POA: Diagnosis not present

## 2020-02-08 DIAGNOSIS — Z483 Aftercare following surgery for neoplasm: Secondary | ICD-10-CM | POA: Diagnosis not present

## 2020-02-08 DIAGNOSIS — Z8616 Personal history of COVID-19: Secondary | ICD-10-CM | POA: Diagnosis not present

## 2020-02-08 DIAGNOSIS — I10 Essential (primary) hypertension: Secondary | ICD-10-CM | POA: Diagnosis not present

## 2020-02-08 DIAGNOSIS — Z4801 Encounter for change or removal of surgical wound dressing: Secondary | ICD-10-CM | POA: Diagnosis not present

## 2020-02-11 ENCOUNTER — Encounter: Payer: Self-pay | Admitting: Adult Health

## 2020-02-11 ENCOUNTER — Ambulatory Visit (INDEPENDENT_AMBULATORY_CARE_PROVIDER_SITE_OTHER): Payer: Medicare Other | Admitting: Adult Health

## 2020-02-11 VITALS — BP 140/80 | HR 71 | Ht 72.0 in | Wt 203.0 lb

## 2020-02-11 DIAGNOSIS — Z4801 Encounter for change or removal of surgical wound dressing: Secondary | ICD-10-CM | POA: Diagnosis not present

## 2020-02-11 DIAGNOSIS — K219 Gastro-esophageal reflux disease without esophagitis: Secondary | ICD-10-CM | POA: Diagnosis not present

## 2020-02-11 DIAGNOSIS — Z483 Aftercare following surgery for neoplasm: Secondary | ICD-10-CM | POA: Diagnosis not present

## 2020-02-11 DIAGNOSIS — I10 Essential (primary) hypertension: Secondary | ICD-10-CM | POA: Diagnosis not present

## 2020-02-11 DIAGNOSIS — Z8673 Personal history of transient ischemic attack (TIA), and cerebral infarction without residual deficits: Secondary | ICD-10-CM

## 2020-02-11 DIAGNOSIS — E876 Hypokalemia: Secondary | ICD-10-CM | POA: Diagnosis not present

## 2020-02-11 DIAGNOSIS — C187 Malignant neoplasm of sigmoid colon: Secondary | ICD-10-CM

## 2020-02-11 DIAGNOSIS — F0151 Vascular dementia with behavioral disturbance: Secondary | ICD-10-CM | POA: Diagnosis not present

## 2020-02-11 DIAGNOSIS — K5900 Constipation, unspecified: Secondary | ICD-10-CM | POA: Diagnosis not present

## 2020-02-11 DIAGNOSIS — F01518 Vascular dementia, unspecified severity, with other behavioral disturbance: Secondary | ICD-10-CM

## 2020-02-11 DIAGNOSIS — C779 Secondary and unspecified malignant neoplasm of lymph node, unspecified: Secondary | ICD-10-CM | POA: Diagnosis not present

## 2020-02-11 DIAGNOSIS — Z8616 Personal history of COVID-19: Secondary | ICD-10-CM | POA: Diagnosis not present

## 2020-02-11 DIAGNOSIS — Z87891 Personal history of nicotine dependence: Secondary | ICD-10-CM | POA: Diagnosis not present

## 2020-02-11 MED ORDER — TRAZODONE HCL 50 MG PO TABS: 75.0000 mg | ORAL_TABLET | Freq: Every evening | ORAL | 3 refills | Status: AC | PRN

## 2020-02-11 NOTE — Progress Notes (Signed)
I agree with the above plan 

## 2020-02-11 NOTE — Patient Instructions (Addendum)
Your Plan:  Recommend trailing increase of trazodone to 75mg  nightly for continued insomnia and agitation - please ensure follow up visit scheduled with PCP for further adjustment   Contact [facility] at [phone] to schedule imaging   Schedule EEG today when you check out    Follow up in 3 months or call earlier if needed    Thank you for coming to see [place] at [facility]. I hope we have been able to provide you high quality care today.  You may receive a patient satisfaction survey over the next few weeks. We would appreciate your feedback and comments so that we may continue to improve ourselves and the health of our patients.    Vascular Dementia Dementia is a condition in which a person has problems with thinking, memory, and behavior that are severe enough to interfere with daily life. Vascular dementia is a type of dementia. It results from brain damage that is caused by the brain not getting enough blood. This condition may also be called vascular cognitive impairment. What are the causes? Vascular dementia is caused by conditions that reduce blood flow to the brain. Common causes of this condition include:  Multiple small strokes. These may happen without symptoms (silent stroke).  Major stroke.  Damage to small blood vessels in the brain (cerebral small vessel disease). What increases the risk? The following factors may make you more likely to develop this condition:  Having had a stroke.  Having high blood pressure (hypertension) or high cholesterol.  Having a disease that affects the heart or blood vessels.  Smoking.  Not being active.  Being over age 82.  Having any of these conditions: ? Diabetes. ? Metabolic syndrome. ? Obesity. ? Depression. ? A genetic condition that leads to stroke, such as CADASIL (cerebral autosomal dominant arteriopathy with subcortical infarcts and leukoencephalopathy). What are the signs or  symptoms? Symptoms of vascular dementia can vary from one person to another. Symptoms may be mild or severe depending on the amount of damage and which parts of the brain have been affected. Symptoms may begin suddenly or may develop slowly. Mental symptoms may include:  Confusion and memory problems.  Poor attention and concentration.  Trouble understanding speech.  Depression.  Personality changes.  Trouble recognizing familiar people.  Agitation or aggression.  Paranoia or false beliefs (delusions).  Hallucinations. These involve seeing, hearing, tasting, smelling, or feeling things that are not real. Physical symptoms may include:  Weakness.  Poor balance.  Loss of bladder or bowel control (incontinence).  Unsteady walking (gait).  Speaking problems. Behavioral symptoms may include:  Getting lost in familiar places.  Problems with planning and judgment.  Trouble following instructions.  Social problems.  Emotional outbursts.  Trouble with daily activities and self-care.  Problems handling money. Symptoms may remain stable, or they may get worse over time. Symptoms of vascular dementia may be similar to those of Alzheimer's disease. The two conditions can occur together (mixed dementia). How is this diagnosed? This condition may be diagnosed based on:  Your medical history and a physical exam.  Symptoms or changes that are reported by friends and family.  Lab tests or other tests that check brain and nervous system function. Tests may include: ? Blood tests. ? Brain imaging tests. ? Tests of movement, speech, and other daily activities (neurological exam). ? Tests of memory, thinking, and problem-solving (neuropsychological or neurocognitive testing). There is not a specific test to diagnose vascular dementia. Diagnosis may involve several specialists.  These may include:  A health care provider who specializes in the brain and nervous system  (neurologist).  A health care provider who specializes in understanding how problems in the brain can alter behavior and cognitive function (neuropsychologist). How is this treated? There is no cure for vascular dementia. Brain damage that has already occurred cannot be reversed. Treatment depends on:  How severe the condition is.  Which parts of your brain have been affected.  Your overall health. Treatment measures aim to:  Treat the underlying cause of vascular dementia and manage risk factors. This may include: ? Controlling blood pressure or lowering cholesterol. ? Treating diabetes. ? Making lifestyle changes, such as quitting smoking or losing weight.  Manage symptoms.  Prevent further brain damage.  Improve the person's health and quality of life. Treatment for dementia may involve a team of health care providers, including:  A neurologist.  A provider who specializes in disorders of the mind (psychiatrist).  A provider who specializes in helping people learn daily living skills (occupational therapist).  A provider who focuses on speech and language changes (Electrical engineer).  A heart specialist (cardiologist).  A provider who helps people learn how to manage physical changes, such as movement and walking (exercise physiologist or physical therapist). Follow these instructions at home: Medicines  Take over-the-counter and prescription medicines only as told by your health care provider.  Use a pill organizer or pill reminder to help you manage your medicines. Lifestyle  Do not use any products that contain nicotine or tobacco, such as cigarettes, e-cigarettes, and chewing tobacco. If you need help quitting, ask your health care provider.  Eat a healthy, balanced diet.  Maintain a healthy weight, or lose weight if needed.  Be physically active as told by your health care provider. General instructions  Work with your health care provider to determine  what you need help with and what your safety needs are.  Follow the health care provider's instructions for treating the condition that caused the dementia.  Keep all follow-up visits. This is important. If you are the caregiver: People with vascular dementia may need regular help at home or daily care from a family member or home health care worker. Home care for a person with vascular dementia depends on what caused the condition and how severe the symptoms are. General guidelines for caregivers include:  Help the person with dementia remember people, appointments, and daily activities.  Help the person with dementia manage his or her medicines.  Help family and friends learn about ways to communicate with the person with dementia.  Create a safe living space to reduce the risk of injury or falls.  Find a support group to help caregivers and family cope with the effects of dementia.   Where to find more information  [employer] of Neurological Disorders and Stroke: MasterBoxes.it Contact a health care provider if:  New behavioral problems develop.  Problems with swallowing develop.  Confusion gets worse.  Sleepiness gets worse. Get help right away if:  Loss of consciousness occurs.  There is a sudden loss of speech, balance, or thinking ability.  New numbness or paralysis occurs.  Sudden, severe headache occurs.  Vision is lost or suddenly gets worse in one or both eyes. If you ever feel like the person with dementia may hurt himself or herself or others, or if he or she shares thoughts about taking his or her own life, get help right away. You can go to your nearest emergency  department or:  Call your local emergency services (911 in the [place]  Call a suicide crisis helpline, such as the [place] at 606-409-2386. This is open 24 hours a day in the [place]  Text the Crisis Text Line at 807-757-8941 (in the [place].). Summary  Vascular  dementia is a type of dementia. It results from brain damage that is caused by the brain not getting enough blood.  Vascular dementia is caused by conditions that reduce blood flow to the brain. Common causes of this condition include stroke and damage to small blood vessels in the brain.  Treatment focuses on treating the underlying cause of vascular dementia and managing any risk factors.  People with vascular dementia may need regular help at home or daily care from a family member or home health care worker.  Contact a health care provider if you or your caregiver notices any new symptoms. This information is not intended to replace advice given to you by your health care provider. Make sure you discuss any questions you have with your health care provider. Document Revised: 05/14/2019 Document Reviewed: 05/14/2019 Elsevier Patient Education  2021 [place].  Dementia Caregiver Guide Dementia is a term used to describe a number of symptoms that affect memory and thinking. The most common symptoms include:  Memory loss.  Trouble with language and communication.  Trouble concentrating.  Poor judgment and problems with reasoning.  Wandering from home or public places.  Extreme anxiety or depression.  Being suspicious or having angry outbursts and accusations.  Child-like behavior and language. Dementia can be frightening and confusing. And taking care of someone with dementia can be challenging. This guide provides tips to help you when providing care for a person with dementia. How to help manage lifestyle changes Dementia usually gets worse slowly over time. In the early stages, people with dementia can stay independent and safe with some help. In later stages, they need help with daily tasks such as dressing, grooming, and using the bathroom. There are actions you can take to help a person manage his or her life while living with this condition. Communicating  When the  person is talking or seems frustrated, make eye contact and hold the person's hand.  Ask specific questions that need yes or no answers.  Use simple words, short sentences, and a calm voice. Only give one direction at a time.  When offering choices, limit the person to just one or two.  Avoid correcting the person in a negative way.  If the person is struggling to find the right words, gently try to help him or her. Preventing injury  Keep floors clear of clutter. Remove rugs, magazine racks, and floor lamps.  Keep hallways well lit, especially at night.  Put a handrail and nonslip mat in the bathtub or shower.  Put childproof locks on cabinets that contain dangerous items, such as medicines, alcohol, guns, toxic cleaning items, sharp tools or utensils, matches, and lighters.  For doors to the outside of the house, put the locks in places where the person cannot see or reach them easily. This will help ensure that the person does not wander out of the house and get lost.  Be prepared for emergencies. Keep a list of emergency phone numbers and addresses in a convenient area.  Remove car keys and lock garage doors so that the person does not try to get in the car and drive.  Have the person wear a bracelet that tracks locations  and identifies the person as having memory problems. This should be worn at all times for safety.   Helping with daily life  Keep the person on track with his or her routine.  Try to identify areas where the person may need help.  Be supportive, patient, calm, and encouraging.  Gently remind the person that adjusting to changes takes time.  Help with the tasks that the person has asked for help with.  Keep the person involved in daily tasks and decisions as much as possible.  Encourage conversation, but try not to get frustrated if the person struggles to find words or does not seem to appreciate your help.   How to recognize stress Look for signs of  stress in yourself and in the person you are caring for. If you notice signs of stress, take steps to manage it. Symptoms of stress include:  Feeling anxious, irritable, frustrated, or angry.  Denying that the person has dementia or that his or her symptoms will not improve.  Feeling depressed, hopeless, or unappreciated.  Difficulty sleeping.  Difficulty concentrating.  Developing stress-related health problems.  Feeling like you have too little time for your own life. Follow these instructions at home: Take care of your health Make sure that you and the person you are caring for:  Get regular sleep.  Exercise regularly.  Eat regular, nutritious meals.  Take over-the-counter and prescription medicines only as told by your health care providers.  Drink enough fluid to keep your urine pale yellow.  Attend all scheduled health care appointments.   General instructions  Join a support group with others who are caregivers.  Ask about respite care resources. Respite care can provide short-term care for the person so that you can have a regular break from the stress of caregiving.  Consider any safety risks and take steps to avoid them.  Organize medicines in a pill box for each day of the week.  Create a plan to handle any legal or financial matters. Get legal or financial advice if needed.  Keep a calendar in a central location to remind the person of appointments or other activities. Where to find support: Many individuals and organizations offer support. These include:  Support groups for people with dementia.  Support groups for caregivers.  Counselors or therapists.  Home health care services.  Adult day care centers. Where to find more information  Centers for Disease Control and Prevention: http://www.wolf.info/  Alzheimer's Association: CapitalMile.co.nz  Family Caregiver Alliance: www.caregiver.[place]: www.alzfdn.org Contact a health  care provider if:  The person's health is rapidly getting worse.  You are no longer able to care for the person.  Caring for the person is affecting your physical and emotional health.  You are feeling depressed or anxious about caring for the person. Get help right away if:  The person threatens himself or herself, you, or anyone else.  You feel depressed or sad, or feel that you want to harm yourself. If you ever feel like your loved one may hurt himself or herself or others, or if he or she shares thoughts about taking his or her own life, get help right away. You can go to your nearest emergency department or:  Call your local emergency services (911 in the [place]  Call a suicide crisis helpline, such as the [place] at (443) 883-7979. This is open 24 hours a day in the [place]  Text the Crisis Text Line at 320-659-0225 (in  the [place] Summary  Dementia is a term used to describe a number of symptoms that affect memory and thinking.  Dementia usually gets worse slowly over time.  Take steps to reduce the person's risk of injury and to plan for future care.  Caregivers need support, relief from caregiving, and time for their own lives. This information is not intended to replace advice given to you by your health care provider. Make sure you discuss any questions you have with your health care provider. Document Revised: 05/14/2019 Document Reviewed: 05/14/2019 Elsevier Patient Education  [place].

## 2020-02-11 NOTE — Progress Notes (Signed)
[facility] [address] [place]. [place] [address] [phone]       OFFICE FOLLOW UP VISIT NOTE  [name] Date of Birth:  1950-10-19 Medical Record Number:  [id number]   Referring MD: Vonna Kotyk Dettinger  Reason for Referral: Memory loss  Chief Complaint  Patient presents with  . Follow-up    Rm 14 with daughter (tammy) Pt is well, daughter states he has some hallucinations and memory declines.     HPI:  Today, 02/11/2020, Todd Berg is being seen for sooner visit per family request due to worsening cognition. He has had multiple ED admissions since prior visit due to lower abdominal pain with concern of primary colonic malignancy with functional closed-loop large bowel obstruction as evidenced on CT abdomen/pelvis and transferred from [facility] ED to [facility] in [place], [place] where he underwent sigmoidoscopy and stent placement on 01/20/2020 and then laparoscopic sigmoidectomy on 01/25/2020 and eventually discharged home on 02/01/2020. Per review of [name] lab work, biopsy came back with moderately differentiated invasive adenocarcinoma, lymphovascular invasion extensively present and 9/12 lymph nodes positive for metastatic carcinoma.  He has been referred to oncology for further evaluation.  Daughter called office on 02/06/2020 requesting sooner visit due to worsening cognition since he returned back home from hospital.  He has had greater difficulty with insomnia, agitation, aggression and hallucinations.  He has been attempting to continue to drive despite family telling him not to.  Daughter is requesting assistance to revoke his license as he will likely not try to drive if he is told that his license is revoked.  He continues to forget about the passing of his wife and when family reminds him of her passing, he becomes very upset as he is reliving this loss over and over.  PCP initiated trazodone for insomnia prior to recent admission and has remained  on 50 mg nightly with daughter reporting initial benefit but no benefit recently.  He will take occasional naps during the day but not necessarily sleeping throughout the entire day.  Limited physical activity and exercise especially since hospital discharge but currently working with [facility] PT. his daughter [name] is his primary caregiver and is with him 24/7.  He has remained on Namenda 10 mg twice daily tolerating without side effects.  MMSE today unable to be completed (prior 9/30 2 mo ago).  CTA head/neck and EEG ordered back in September not yet scheduled.  No further concerns at this time.   History provided for reference purposes only Update 12/10/2019 Dr. Leonie Man: He returns for follow-up after last visit 2 months ago.  Patient is accompanied by his daughter [name].  Patient got Covid infection in September along with his wife who unfortunately later died of Covid complications.  Patient has been able to tolerate Namenda 10 mg twice daily but family feels that his still has significant cognitive impairment.  Requires 24-hour supervision.  He has been getting agitated more frequently.  He has trouble sleeping at night despite taking melatonin which he takes at bedtime.  Is also gets scared easily and sometimes paranoid.  He has had longstanding tremors in his hands.  There have been no unsafe behaviors, delusions or hallucinations.  Blood pressure usually well controlled today it is 146/84 in office.  He has not had any recurrent stroke or TIA symptoms.  He is unable to tolerate aspirin because of previous history of GI hemorrhage and hence is not taking it   Initial consultation 09/20/2019 Dr. Leonie Man:  Todd Berg is a 70 year old Caucasian male seen today for office consultation visit for memory loss.  History is obtained from the patient and his daughter [name] was accompanying him as well as review of electronic medical records and I have reviewed personally available imaging films in PACS.  Todd Berg is a past  medical history of rectal cancer, chronic back pain, hypertension, left thalamic lacunar infarct in May 2019 with no remaining residual deficits.  He has had cognitive decline he feels ever since his stroke 2 years ago.  This has been progressive in recent months seems to have impaired his functionality at home.  He gets quite forgetful even after being told several times recent information.  He gets agitated easily.  He can still remember things from the past well but recent incident seem difficult.  He has had not had any violent behavior and denies any delusions, hallucinations or unsafe behavior.  He is eats and sleeps well.  He can walk independently without assistance.  He has had no recurrent symptoms of new strokes.  He does have family history of dementia in his mother in her 31s.  Patient did have an outpatient MRI scan of the brain done on 08/27/2019 which I personally reviewed shows left thalamic infarcts of remote age and 2 lesions separated one of them was on a previous MRI from May 29, 2017 the other 1 appears to be in new but not recent.  There is diminished flow noted in the right vertebral artery.  Previously echocardiogram in May 2019 was unremarkable.  He has not had any recent dementia pending labs.  The family denies any history of witnessed seizure, head injury, loss of consciousness.  He denies history of depression.  He apparently had Mini-Mental status done at the primary care physician's office recently which also showed 9/30.    ROS:   N/A d/t cognitive impairment  PMH:  Past Medical History:  Diagnosis Date  . Cancer ([place]) 2015   rectal  . Chronic back pain   . COPD (chronic obstructive pulmonary disease) ([place])   . Dementia ([place])   . GERD (gastroesophageal reflux disease)   . Hematemesis   . Hemorrhoids   . Hyperlipidemia   . Hypotension   . Melena   . Melena 10/12/2017  . Stroke [facility]) 2019    Social History:  Social History   Socioeconomic History  . Marital  status: Married    Spouse name: [name]  . Number of children: 5  . Years of education: 8  . Highest education level: 8th grade  Occupational History  . Occupation: Retired  Tobacco Use  . Smoking status: Former Smoker    Start date: 12/30/1964    Quit date: 06/19/2009    Years since quitting: 10.6  . Smokeless tobacco: Former Systems developer    Types: Snuff    Quit date: 09/26/2005  Vaping Use  . Vaping Use: Never used  Substance and Sexual Activity  . Alcohol use: No  . Drug use: No  . Sexual activity: Not Currently  Other Topics Concern  . Not on file  Social History Narrative   Lives with daughter and son in law   Right Handed   Drinks 3-5 cups caffeine daily   Social Determinants of Health   Financial Resource Strain: Low Risk   . Difficulty of Paying Living Expenses: Not hard at all  Food Insecurity: No Food Insecurity  . Worried About Charity fundraiser in the Last Year: Never true  .  Ran Out of Food in the Last Year: Never true  Transportation Needs: No Transportation Needs  . Lack of Transportation (Medical): No  . Lack of Transportation (Non-Medical): No  Physical Activity: Inactive  . Days of Exercise per Week: 0 days  . Minutes of Exercise per Session: 0 min  Stress: No Stress Concern Present  . Feeling of Stress : Not at all  Social Connections: Socially Integrated  . Frequency of Communication with Friends and Family: More than three times a week  . Frequency of Social Gatherings with Friends and Family: More than three times a week  . Attends Religious Services: More than 4 times per year  . Active Member of Clubs or Organizations: Yes  . Attends Archivist Meetings: More than 4 times per year  . Marital Status: Married  Human resources officer Violence: Not At Risk  . Fear of Current or Ex-Partner: No  . Emotionally Abused: No  . Physically Abused: No  . Sexually Abused: No    Medications:   Current Outpatient Medications on File Prior to Visit   Medication Sig Dispense Refill  . amLODipine (NORVASC) 2.5 MG tablet TAKE 1 TABLET ONCE DIALY (Patient taking differently: Take 2.5 mg by mouth daily.) 90 tablet 0  . cetirizine (ZYRTEC) 10 MG tablet Take 1 tablet (10 mg total) by mouth daily. For itching 90 tablet 0  . Cyanocobalamin (VITAMIN B-12) 5000 MCG SUBL DISSOLVE 1 TABLET UNDER TONGUE DAILY 110 tablet 0  . diphenhydramine-acetaminophen (TYLENOL PM) 25-500 MG TABS tablet Take 2 tablets by mouth at bedtime as needed (mild pain).    Marland Kitchen docusate sodium (COLACE) 100 MG capsule Take 1 capsule (100 mg total) by mouth daily as needed for mild constipation. 30 capsule 0  . famotidine (PEPCID) 20 MG tablet Take 1 tablet (20 mg total) by mouth 2 (two) times daily. 30 tablet 0  . Melatonin 10 MG TBCR Take 1 tablet by mouth every evening.    . memantine (NAMENDA) 10 MG tablet Take 10 mg by mouth 2 (two) times daily.    . pantoprazole (PROTONIX) 40 MG tablet Take 1 tablet (40 mg total) by mouth 2 (two) times daily before a meal. 180 tablet 0  . potassium chloride (KLOR-CON) 10 MEQ tablet Take 1 tablet (10 mEq total) by mouth daily. 30 tablet 2  . psyllium (METAMUCIL) 58.6 % packet Take 1 packet by mouth daily.     No current facility-administered medications on file prior to visit.    Allergies:  No Known Allergies  Physical Exam Today's Vitals   02/11/20 1533  BP: 140/80  Pulse: 71  Weight: 203 lb (92.1 kg)  Height: 6' (1.829 m)   Body mass index is 27.53 kg/m.   General: well developed, well nourished very pleasant elderly Caucasian male, seated, in no evident distress Head: head normocephalic and atraumatic.   Neck: supple with no carotid or supraclavicular bruits Cardiovascular: regular rate and rhythm, no murmurs Musculoskeletal: no deformity Skin:  no rash/petichiae Vascular:  Normal pulses all extremities  Neurologic Exam Mental Status: Awake and fully alert. Recent and remote memory poor. Attention span, concentration and  fund of knowledge diminished with daughter providing history. Mood and affect appropriate and cooperative with exam.  MMSE unable to be completed today MMSE - Mini Mental State Exam 02/11/2020 12/10/2019 09/20/2019  Not completed: Unable to complete - -  Orientation to time - 0 0  Orientation to Place - 1 0  Registration - 3 3  Attention/  Calculation - 0 0  Recall - 0 0  Language- name 2 objects - 2 1  Language- repeat - 1 1  Language- follow 3 step command - 2 1  Language- read & follow direction - 0 0  Write a sentence - 0 0  Copy design - 0 0  Total score - 9 6   Cranial Nerves:  pupils equal, briskly reactive to light. Extraocular movements full without nystagmus. Visual fields full to confrontation. Hearing intact. Facial sensation intact. Face, tongue, palate moves normally and symmetrically.  Motor: Normal bulk and tone. Normal strength in all tested extremity muscles.  Mild upper extremity action tremor left greater than right.  No cogwheel rigidity or pill-rolling. Sensory.: intact to touch , pinprick , position and vibratory sensation.  Coordination: Rapid alternating movements normal in all extremities. Finger-to-nose and heel-to-shin performed accurately bilaterally. Gait and Station: Arises from chair without difficulty. Stance is slightly hunched. Gait demonstrates mild ataxia with broad-based gait with decreased step height and step length.  Ambulates without assistive device.  Tandem walk and heel toe not attempted. Reflexes: 1+ and symmetric. Toes downgoing.       ASSESSMENT/PLAN: 70 year old Caucasian male with progressive memory and cognitive deterioration over the last 2 years following a small lacunar infarct likely due to vascular dementia with recent worsening after he was found to have large bowel obstruction s/p sigmoidectomy 01/25/2020 and biopsy confirming suspicion of adenocarcinoma.  History of left thalamic lacunar infarct in May 2019 and additional left thalamic  infarct noted on subsequent MRI scan on 08/27/2019 clinically silent.  Vascular risk factors of hypertension and small vessel disease      1. Vascular dementia w/ behaviors: Recent progression likely in setting of hospitalization undergoing sigmoidectomy and new diagnosis of colon cancer.  MMSE unable to be completed.  Continued insomnia despite trazodone usage therefore recommend trialing increase from 50 mg to 75 mg nightly.  Dosage increase may also provide benefit with agitation and aggression.  Discussed potential side effects and daughter wishes to proceed.  She will call office with any need of increase or difficulty tolerating.  Also discussed importance of daytime routine and activity with avoidance of frequent naps which may help with nighttime insomnia.  Discussed avoidance of driving and a letter will be sent to [facility] per daughter request requesting license to be revoked.  Recommend continued melatonin 10 mg nightly as well as Namenda 10 mg twice daily.  Advised to schedule EEG today upon checkout as previously recommended by Dr. Leonie Man 2. Hx of prior strokes: Discussed importance of secondary stroke prevention measures and close PCP f/u for aggressive stroke risk factor management including HTN with BP goal<130/90.  He declines interest in aspirin due to history of GI bleed.  Provider number to [facility] imaging to schedule recommended CTA head/neck and daughter plans on calling to schedule appointment 3. Colon cancer: Recent diagnosis.  Has initial evaluation scheduled on 02/20/2020.  Discussed risk versus benefit of chemo and radiation if indicated in regards to his vascular dementia and highly encouraged further discussion with oncologist regarding risk versus benefit.   Follow-up in 3 months or call earlier if needed   CC:  [facility] provider: Dr. Leonie Man Dettinger, [name], MD   I spent 30 minutes of face-to-face and non-face-to-face time with patient and daughter.  This included previsit  chart review, lab review, study review, order entry, electronic health record documentation, patient and daughter education and discussion regarding vascular dementia and recent progression, importance of managing  stroke risk factors, recent diagnosis of colon cancer and benefit versus risk of chemo and radiation, and answered all other questions to patient and daughter satisfaction  Frann Rider, AGNP-BC  [facility] [address] [place], [place] [address]  Phone [phone] Fax [phone] Note: This document was prepared with digital dictation and possible smart phrase technology. Any transcriptional errors that result from this process are unintentional.

## 2020-02-12 DIAGNOSIS — Z8616 Personal history of COVID-19: Secondary | ICD-10-CM | POA: Diagnosis not present

## 2020-02-12 DIAGNOSIS — K219 Gastro-esophageal reflux disease without esophagitis: Secondary | ICD-10-CM | POA: Diagnosis not present

## 2020-02-12 DIAGNOSIS — Z483 Aftercare following surgery for neoplasm: Secondary | ICD-10-CM | POA: Diagnosis not present

## 2020-02-12 DIAGNOSIS — K5900 Constipation, unspecified: Secondary | ICD-10-CM | POA: Diagnosis not present

## 2020-02-12 DIAGNOSIS — Z8673 Personal history of transient ischemic attack (TIA), and cerebral infarction without residual deficits: Secondary | ICD-10-CM | POA: Diagnosis not present

## 2020-02-12 DIAGNOSIS — C187 Malignant neoplasm of sigmoid colon: Secondary | ICD-10-CM | POA: Diagnosis not present

## 2020-02-12 DIAGNOSIS — Z87891 Personal history of nicotine dependence: Secondary | ICD-10-CM | POA: Diagnosis not present

## 2020-02-12 DIAGNOSIS — Z4801 Encounter for change or removal of surgical wound dressing: Secondary | ICD-10-CM | POA: Diagnosis not present

## 2020-02-12 DIAGNOSIS — C779 Secondary and unspecified malignant neoplasm of lymph node, unspecified: Secondary | ICD-10-CM | POA: Diagnosis not present

## 2020-02-12 DIAGNOSIS — I10 Essential (primary) hypertension: Secondary | ICD-10-CM | POA: Diagnosis not present

## 2020-02-12 DIAGNOSIS — E876 Hypokalemia: Secondary | ICD-10-CM | POA: Diagnosis not present

## 2020-02-13 DIAGNOSIS — Z87891 Personal history of nicotine dependence: Secondary | ICD-10-CM | POA: Diagnosis not present

## 2020-02-13 DIAGNOSIS — K219 Gastro-esophageal reflux disease without esophagitis: Secondary | ICD-10-CM | POA: Diagnosis not present

## 2020-02-13 DIAGNOSIS — C187 Malignant neoplasm of sigmoid colon: Secondary | ICD-10-CM | POA: Diagnosis not present

## 2020-02-13 DIAGNOSIS — Z8673 Personal history of transient ischemic attack (TIA), and cerebral infarction without residual deficits: Secondary | ICD-10-CM | POA: Diagnosis not present

## 2020-02-13 DIAGNOSIS — K5900 Constipation, unspecified: Secondary | ICD-10-CM | POA: Diagnosis not present

## 2020-02-13 DIAGNOSIS — E876 Hypokalemia: Secondary | ICD-10-CM | POA: Diagnosis not present

## 2020-02-13 DIAGNOSIS — Z8616 Personal history of COVID-19: Secondary | ICD-10-CM | POA: Diagnosis not present

## 2020-02-13 DIAGNOSIS — Z483 Aftercare following surgery for neoplasm: Secondary | ICD-10-CM | POA: Diagnosis not present

## 2020-02-13 DIAGNOSIS — C779 Secondary and unspecified malignant neoplasm of lymph node, unspecified: Secondary | ICD-10-CM | POA: Diagnosis not present

## 2020-02-13 DIAGNOSIS — Z4801 Encounter for change or removal of surgical wound dressing: Secondary | ICD-10-CM | POA: Diagnosis not present

## 2020-02-13 DIAGNOSIS — I10 Essential (primary) hypertension: Secondary | ICD-10-CM | POA: Diagnosis not present

## 2020-02-14 DIAGNOSIS — K5900 Constipation, unspecified: Secondary | ICD-10-CM | POA: Diagnosis not present

## 2020-02-14 DIAGNOSIS — Z4801 Encounter for change or removal of surgical wound dressing: Secondary | ICD-10-CM | POA: Diagnosis not present

## 2020-02-14 DIAGNOSIS — Z8616 Personal history of COVID-19: Secondary | ICD-10-CM | POA: Diagnosis not present

## 2020-02-14 DIAGNOSIS — Z8673 Personal history of transient ischemic attack (TIA), and cerebral infarction without residual deficits: Secondary | ICD-10-CM | POA: Diagnosis not present

## 2020-02-14 DIAGNOSIS — E876 Hypokalemia: Secondary | ICD-10-CM | POA: Diagnosis not present

## 2020-02-14 DIAGNOSIS — Z483 Aftercare following surgery for neoplasm: Secondary | ICD-10-CM | POA: Diagnosis not present

## 2020-02-14 DIAGNOSIS — Z87891 Personal history of nicotine dependence: Secondary | ICD-10-CM | POA: Diagnosis not present

## 2020-02-14 DIAGNOSIS — C187 Malignant neoplasm of sigmoid colon: Secondary | ICD-10-CM | POA: Diagnosis not present

## 2020-02-14 DIAGNOSIS — C779 Secondary and unspecified malignant neoplasm of lymph node, unspecified: Secondary | ICD-10-CM | POA: Diagnosis not present

## 2020-02-14 DIAGNOSIS — K219 Gastro-esophageal reflux disease without esophagitis: Secondary | ICD-10-CM | POA: Diagnosis not present

## 2020-02-14 DIAGNOSIS — I10 Essential (primary) hypertension: Secondary | ICD-10-CM | POA: Diagnosis not present

## 2020-02-15 ENCOUNTER — Telehealth: Payer: Self-pay

## 2020-02-15 NOTE — Telephone Encounter (Signed)
Spoke with patient's daughter [name] and scheduled an in-person Palliative Consult for 02/26/20 @ 1PM  COVID screening was negative. No pets in home. Patient's daughter and son in law live with him.   Consent obtained; updated Outlook/Netsmart/Team List and Epic.  Family is aware they will be receiving a call from NP the day before or day of to confirm appointment.

## 2020-02-18 DIAGNOSIS — Z4801 Encounter for change or removal of surgical wound dressing: Secondary | ICD-10-CM | POA: Diagnosis not present

## 2020-02-18 DIAGNOSIS — C187 Malignant neoplasm of sigmoid colon: Secondary | ICD-10-CM | POA: Diagnosis not present

## 2020-02-18 DIAGNOSIS — Z8616 Personal history of COVID-19: Secondary | ICD-10-CM | POA: Diagnosis not present

## 2020-02-18 DIAGNOSIS — I10 Essential (primary) hypertension: Secondary | ICD-10-CM | POA: Diagnosis not present

## 2020-02-18 DIAGNOSIS — K5900 Constipation, unspecified: Secondary | ICD-10-CM | POA: Diagnosis not present

## 2020-02-18 DIAGNOSIS — K219 Gastro-esophageal reflux disease without esophagitis: Secondary | ICD-10-CM | POA: Diagnosis not present

## 2020-02-18 DIAGNOSIS — Z483 Aftercare following surgery for neoplasm: Secondary | ICD-10-CM | POA: Diagnosis not present

## 2020-02-18 DIAGNOSIS — Z87891 Personal history of nicotine dependence: Secondary | ICD-10-CM | POA: Diagnosis not present

## 2020-02-18 DIAGNOSIS — Z8673 Personal history of transient ischemic attack (TIA), and cerebral infarction without residual deficits: Secondary | ICD-10-CM | POA: Diagnosis not present

## 2020-02-18 DIAGNOSIS — E876 Hypokalemia: Secondary | ICD-10-CM | POA: Diagnosis not present

## 2020-02-18 DIAGNOSIS — C779 Secondary and unspecified malignant neoplasm of lymph node, unspecified: Secondary | ICD-10-CM | POA: Diagnosis not present

## 2020-02-19 ENCOUNTER — Other Ambulatory Visit (INDEPENDENT_AMBULATORY_CARE_PROVIDER_SITE_OTHER): Payer: Medicare Other | Admitting: Neurology

## 2020-02-19 ENCOUNTER — Telehealth: Payer: Self-pay | Admitting: *Deleted

## 2020-02-19 DIAGNOSIS — R410 Disorientation, unspecified: Secondary | ICD-10-CM

## 2020-02-19 DIAGNOSIS — E876 Hypokalemia: Secondary | ICD-10-CM | POA: Diagnosis not present

## 2020-02-19 DIAGNOSIS — K219 Gastro-esophageal reflux disease without esophagitis: Secondary | ICD-10-CM | POA: Diagnosis not present

## 2020-02-19 DIAGNOSIS — Z8673 Personal history of transient ischemic attack (TIA), and cerebral infarction without residual deficits: Secondary | ICD-10-CM | POA: Diagnosis not present

## 2020-02-19 DIAGNOSIS — K5900 Constipation, unspecified: Secondary | ICD-10-CM | POA: Diagnosis not present

## 2020-02-19 DIAGNOSIS — Z87891 Personal history of nicotine dependence: Secondary | ICD-10-CM | POA: Diagnosis not present

## 2020-02-19 DIAGNOSIS — C779 Secondary and unspecified malignant neoplasm of lymph node, unspecified: Secondary | ICD-10-CM | POA: Diagnosis not present

## 2020-02-19 DIAGNOSIS — I10 Essential (primary) hypertension: Secondary | ICD-10-CM | POA: Diagnosis not present

## 2020-02-19 DIAGNOSIS — Z483 Aftercare following surgery for neoplasm: Secondary | ICD-10-CM | POA: Diagnosis not present

## 2020-02-19 DIAGNOSIS — Z8616 Personal history of COVID-19: Secondary | ICD-10-CM | POA: Diagnosis not present

## 2020-02-19 DIAGNOSIS — Z4801 Encounter for change or removal of surgical wound dressing: Secondary | ICD-10-CM | POA: Diagnosis not present

## 2020-02-19 DIAGNOSIS — C187 Malignant neoplasm of sigmoid colon: Secondary | ICD-10-CM | POA: Diagnosis not present

## 2020-02-20 ENCOUNTER — Ambulatory Visit: Admit: 2020-02-20 | Discharge: 2020-02-20 | Payer: MEDICARE | Attending: Internal Medicine | Primary: Internal Medicine

## 2020-02-20 ENCOUNTER — Encounter: Admit: 2020-02-20 | Discharge: 2020-02-20 | Payer: MEDICARE | Attending: Internal Medicine | Primary: Internal Medicine

## 2020-02-20 ENCOUNTER — Encounter: Admit: 2020-02-20 | Discharge: 2020-02-20 | Payer: MEDICARE

## 2020-02-20 ENCOUNTER — Other Ambulatory Visit: Payer: Self-pay

## 2020-02-20 ENCOUNTER — Ambulatory Visit (INDEPENDENT_AMBULATORY_CARE_PROVIDER_SITE_OTHER): Payer: Medicare Other

## 2020-02-20 ENCOUNTER — Other Ambulatory Visit: Payer: Self-pay | Admitting: Neurology

## 2020-02-20 DIAGNOSIS — C187 Malignant neoplasm of sigmoid colon: Principal | ICD-10-CM

## 2020-02-20 DIAGNOSIS — Z1159 Encounter for screening for other viral diseases: Principal | ICD-10-CM

## 2020-02-20 DIAGNOSIS — Z09 Encounter for follow-up examination after completed treatment for conditions other than malignant neoplasm: Principal | ICD-10-CM

## 2020-02-20 DIAGNOSIS — Z483 Aftercare following surgery for neoplasm: Secondary | ICD-10-CM

## 2020-02-20 DIAGNOSIS — E876 Hypokalemia: Secondary | ICD-10-CM | POA: Diagnosis not present

## 2020-02-20 DIAGNOSIS — I10 Essential (primary) hypertension: Secondary | ICD-10-CM | POA: Diagnosis not present

## 2020-02-20 DIAGNOSIS — Z9049 Acquired absence of other specified parts of digestive tract: Secondary | ICD-10-CM

## 2020-02-20 DIAGNOSIS — Z8673 Personal history of transient ischemic attack (TIA), and cerebral infarction without residual deficits: Secondary | ICD-10-CM

## 2020-02-20 DIAGNOSIS — K219 Gastro-esophageal reflux disease without esophagitis: Secondary | ICD-10-CM | POA: Diagnosis not present

## 2020-02-20 DIAGNOSIS — C779 Secondary and unspecified malignant neoplasm of lymph node, unspecified: Secondary | ICD-10-CM | POA: Diagnosis not present

## 2020-02-20 DIAGNOSIS — Z4801 Encounter for change or removal of surgical wound dressing: Secondary | ICD-10-CM

## 2020-02-20 DIAGNOSIS — K5900 Constipation, unspecified: Secondary | ICD-10-CM

## 2020-02-20 DIAGNOSIS — Z79891 Long term (current) use of opiate analgesic: Secondary | ICD-10-CM

## 2020-02-20 DIAGNOSIS — Z87891 Personal history of nicotine dependence: Secondary | ICD-10-CM | POA: Diagnosis not present

## 2020-02-20 DIAGNOSIS — Z8701 Personal history of pneumonia (recurrent): Secondary | ICD-10-CM

## 2020-02-20 DIAGNOSIS — Z8616 Personal history of COVID-19: Secondary | ICD-10-CM

## 2020-02-20 DIAGNOSIS — R419 Unspecified symptoms and signs involving cognitive functions and awareness: Secondary | ICD-10-CM

## 2020-02-20 DIAGNOSIS — Z7901 Long term (current) use of anticoagulants: Secondary | ICD-10-CM

## 2020-02-20 DIAGNOSIS — F0280 Dementia in other diseases classified elsewhere without behavioral disturbance: Secondary | ICD-10-CM

## 2020-02-20 MED ORDER — FAMOTIDINE 20 MG PO TABS: 20.0000 mg | ORAL_TABLET | Freq: Two times a day (BID) | ORAL | 1 refills | Status: AC

## 2020-02-20 NOTE — Telephone Encounter (Signed)
Sent refill for famotidine for the patient.

## 2020-02-21 ENCOUNTER — Telehealth: Payer: Self-pay | Admitting: Adult Health

## 2020-02-21 DIAGNOSIS — K5900 Constipation, unspecified: Secondary | ICD-10-CM | POA: Diagnosis not present

## 2020-02-21 DIAGNOSIS — Z8616 Personal history of COVID-19: Secondary | ICD-10-CM | POA: Diagnosis not present

## 2020-02-21 DIAGNOSIS — C779 Secondary and unspecified malignant neoplasm of lymph node, unspecified: Secondary | ICD-10-CM | POA: Diagnosis not present

## 2020-02-21 DIAGNOSIS — C187 Malignant neoplasm of sigmoid colon: Secondary | ICD-10-CM | POA: Diagnosis not present

## 2020-02-21 DIAGNOSIS — Z87891 Personal history of nicotine dependence: Secondary | ICD-10-CM | POA: Diagnosis not present

## 2020-02-21 DIAGNOSIS — I10 Essential (primary) hypertension: Secondary | ICD-10-CM | POA: Diagnosis not present

## 2020-02-21 DIAGNOSIS — Z483 Aftercare following surgery for neoplasm: Secondary | ICD-10-CM | POA: Diagnosis not present

## 2020-02-21 DIAGNOSIS — Z8673 Personal history of transient ischemic attack (TIA), and cerebral infarction without residual deficits: Secondary | ICD-10-CM | POA: Diagnosis not present

## 2020-02-21 DIAGNOSIS — K219 Gastro-esophageal reflux disease without esophagitis: Secondary | ICD-10-CM | POA: Diagnosis not present

## 2020-02-21 DIAGNOSIS — E876 Hypokalemia: Secondary | ICD-10-CM | POA: Diagnosis not present

## 2020-02-21 DIAGNOSIS — Z4801 Encounter for change or removal of surgical wound dressing: Secondary | ICD-10-CM | POA: Diagnosis not present

## 2020-02-21 MED ORDER — LOPERAMIDE 2 MG CAPSULE
ORAL_CAPSULE | 11 refills | 0 days | Status: CP
Start: 2020-02-21 — End: ?

## 2020-02-21 MED ORDER — ONDANSETRON HCL 8 MG TABLET
ORAL_TABLET | Freq: Three times a day (TID) | ORAL | 2 refills | 10 days | Status: CP | PRN
Start: 2020-02-21 — End: ?

## 2020-02-21 MED ORDER — PROCHLORPERAZINE MALEATE 10 MG TABLET
ORAL_TABLET | Freq: Four times a day (QID) | ORAL | 2 refills | 8 days | Status: CP | PRN
Start: 2020-02-21 — End: ?

## 2020-02-21 MED ORDER — SODIUM CHLORIDE 0.9 % (FLUSH) INJECTION SYRINGE
INTRAVENOUS | PRN refills | 0.00000 days
Start: 2020-02-21 — End: ?

## 2020-02-21 MED ORDER — HEPARIN, PORCINE (PF) 100 UNIT/ML INTRAVENOUS SYRINGE
INTRAVENOUS | PRN refills | 0 days
Start: 2020-02-21 — End: ?

## 2020-02-21 NOTE — Telephone Encounter (Signed)
Called daughter [name] and informed her EEG was done yesterday and results can take a week . Advised she'll get a call when results are ready. She verbalized understanding, appreciation.

## 2020-02-21 NOTE — Telephone Encounter (Signed)
Pt.'s daughter Jones Broom is on [place]. She is asking for dad's EEG results. Please advise.

## 2020-02-25 ENCOUNTER — Telehealth: Payer: Self-pay

## 2020-02-25 NOTE — Telephone Encounter (Signed)
Patients daughter called and asked if patients traZODone can be increased to 100mg . Patient has not slept well in 2 weeks. Patient would also like a call back to discuss. [name] [phone]

## 2020-02-26 ENCOUNTER — Other Ambulatory Visit: Payer: Medicare Other | Admitting: Nurse Practitioner

## 2020-02-26 ENCOUNTER — Other Ambulatory Visit: Payer: Self-pay

## 2020-02-26 DIAGNOSIS — C801 Malignant (primary) neoplasm, unspecified: Secondary | ICD-10-CM | POA: Diagnosis not present

## 2020-02-26 DIAGNOSIS — Z515 Encounter for palliative care: Secondary | ICD-10-CM

## 2020-02-26 NOTE — Progress Notes (Signed)
Kindly inform the patient that brainwave study showed mild slowing of brain activity which can be seen with old-age memory loss or other variety of the conditions but no seizure activity or any worrisome finding was noted

## 2020-02-26 NOTE — Progress Notes (Signed)
[facility] Consult Note Telephone: [phone]  Fax: [phone]  PATIENT NAME: Todd Berg [place] [address] [phone] (home)  DOB: 07/16/1950 MRN: [id number]  PRIMARY CARE PROVIDER:    Dettinger, [name], MD,  [place] [address] [phone]  REFERRING PROVIDER:   Dettinger, [name], MD [address],  [place] [address] [phone]  RESPONSIBLE PARTY:   Extended Emergency Contact Information Primary Emergency Contact: Peri Maris Mobile Phone: [phone] Relation: Daughter Preferred language: English Interpreter needed? No  I met face to face with patient in home.   ASSESSMENT AND RECOMMENDATIONS:   Advance Care Planning: Today's visit consisted of building trust and discussions on Palliative care medicine as specialized medical care for people living with serious illness, aimed at facilitating improved quality of life through symptoms relief, assisting with advance care planning and establishing goals of care. Daughter [name] present at visit, patient unable to substantively participate in discussion due to poor cognition related to advance dementia. Family expressed appreciation for education provided on Palliative care and how it differs from Hospice service. Goal of care: Per daughter, patient's goal of care is comfort. Family desires for patient to be comfortable and enjoy the rest of his life with his family. Directives: After discussions on ramifications and implications of code status. [name] report patient would not want to be resuscitated in the event of cardiac or respiratory arrest. DNR form signed and given to [name] to keep in home, copy uploaded to [facility].The need to complete a MOST form was discussed, [name] expressed interest in completing a MOST form, she however want to discuss it with her siblings. Blank copy of MOST form given to [name] today, we will  complete at next visit. Palliative care will continue to provided support to patient, family and medical team.  Symptom Management:  Pain: Patient with complaint of generalized pain. Unable to quantify or describe pain. Daughter report pain has been progressively worse with patient having difficulty walking or getting in and out of bed. Patient currently takes Ibuprofen 238m two to three times a day as needed for pain.  Recommendation: Discussed limiting use of Ibuprofen due to cardiovascular risk. Recommend starting patient on Tylenol 6573mby mouth twice a day, may give additional dose in the afternoon if needed.  Weight loss: related to poor oral intake secondary to weakness and dementia. Daughter report 25-50% meal intake. Recommendation: Advised starting patient on Ensure nutritional supplement, give one to two cans a day. Continue to offer healthy meal choices from a variety of food groups. Encourage snacking between meals. Complex decision making discussion held regarding patient's stage 3 cancer diagnosis, we discussed patient's current illness and what it means in the larger context of patient's ongoing co-morbidities including advance dementia (FAST 6d).  I attempted to elicit values and goals of care important to the patient and his family. Daughter verbalized desire for patient to be comfortable and not in pain as he lives out the rest of his life, she verbalized fear and concern for how patient would tolerate chemotherapy in his current health condition, saying patient is already weak and not eating. Validation provided.  The difference between aggressive medical intervention and comfort care was considered in light of patient's goals of care of comfort.Made aware that chemotherapy may worsens patient's current condition. Home hospice services and philosophy was discussed and explained in detail. [name] expressed interest in home hospice, she however want  to discuss it with rest of her family.  Saying some of her siblings does not want patient to pursue chemotherapy. Questions and concerns were addressed. [name] was encouraged to call with questions and/or concerns. My business card was provided. Palliative care will continue to provide support to patient, family and the medical team.   Follow up Palliative Care Visit: Palliative care will continue to follow for goals of care clarification and symptom management. Return in about 4 weeks or prn.  Family /Caregiver/Community Supports: Patient lives at home, daughter lives with him, she is his main caregiver, has 4 children, wife died last year from Covid-19 infection. Twin visits twice a week for wound care, now will be coming once a week.  Cognitive / Functional decline: Patient awake, confused and weak, oriented to self only.  Ambulates with standby assist, has a walker that he does not use. Requires assistance getting in and out of bed. Requires assistance with bathing and dressing. No report of recent fall.  I spent 60 minutes providing this consultation, time includes time spent with patient and amily, chart review, provider coordination, and documentation. More than 50% of the time in this consultation was spent counseling and coordinating communication.   CHIEF COMPLAINT: Generalized pain  History obtained from review of EMR, discussion with primary team, and interview with family. Records reviewed and summarized bellow.  HISTORY OF PRESENT ILLNESS:  Todd Berg is a 70 y.o. year old male with multiple medical problems including vascular dementia (FAST 6d), COPD, GERD, HLD, history of rectal cancer, chronic back pain, hypertension, left thalamic lacunar infarct in May 2019 with no remaining residual deficits (not on Aspirin due to history of GI bleed). Patient with diagnosis with stage 3 colon cancer last month, his biopsy was positive for moderately differentiated invasive adenocarcinoma, lymphovascular invasion  extensively present and 9/12 lymph nodes positive for metastatic carcinoma. Patient is followed by Dr. Ricki Rodriguez of [facility]. Patient is s/p sigmoidectomy on 01/25/2020 with plan to start chemotherapy. Patient also noted with pulmonary nodule CT chest 01/20/2020 that is in determinant. Palliative Care was asked to follow this patient by consultation request of Dettinger, [name], MD to help address advance care planning and goals of care. This is an initial visit.  CODE STATUS: DNR  PPS: 40%  HOSPICE ELIGIBILITY/DIAGNOSIS: TBD  PHYSICAL EXAM / ROS:   Current and past weights: 201lbs down from 240lbs baseline weight, ht 21f2, BMI 25.93kg/m2 General: chronically ill and frail appearing, confused, lying in bed with eyes closed. Cardiovascular: no chest pain reported, no edema  Pulmonary: no cough, no increased SOB, room air GI: no swallowing issues reported, appetite poor, no report of constipation, continent of bowel GU: no report of dysuria, incontinent of urine MSK:  no joint and ROM abnormalities, ambulatory with standby assist Skin: dressing on abdominal surgical wound dry and intact Neurological: Weakness, confused Psych: non -anxious affect  PAST MEDICAL HISTORY:  Past Medical History:  Diagnosis Date  . Cancer ([facility] 2015   rectal  . Chronic back pain   . COPD (chronic obstructive pulmonary disease) ([facility]   . Dementia ([facility]   . GERD (gastroesophageal reflux disease)   . Hematemesis   . Hemorrhoids   . Hyperlipidemia   . Hypotension   . Melena   . Melena 10/12/2017  . Stroke ([facility] 2019    SOCIAL HX:  Social History   Tobacco Use  . Smoking status: Former Smoker    Start date: 12/30/1964  Quit date: 06/19/2009    Years since quitting: 10.6  . Smokeless tobacco: Former Systems developer    Types: Snuff    Quit date: 09/26/2005  Substance Use Topics  . Alcohol use: No   FAMILY HX:  Family History  Problem Relation Age of Onset  . COPD Mother   . Obesity Mother   . Stroke  Father     ALLERGIES: No Known Allergies   PERTINENT MEDICATIONS:  Outpatient Encounter Medications as of 02/26/2020  Medication Sig  . amLODipine (NORVASC) 2.5 MG tablet TAKE 1 TABLET ONCE DIALY (Patient taking differently: Take 2.5 mg by mouth daily.)  . cetirizine (ZYRTEC) 10 MG tablet Take 1 tablet (10 mg total) by mouth daily. For itching  . Cyanocobalamin (VITAMIN B-12) 5000 MCG SUBL DISSOLVE 1 TABLET UNDER TONGUE DAILY  . diphenhydramine-acetaminophen (TYLENOL PM) 25-500 MG TABS tablet Take 2 tablets by mouth at bedtime as needed (mild pain).  Marland Kitchen docusate sodium (COLACE) 100 MG capsule Take 1 capsule (100 mg total) by mouth daily as needed for mild constipation.  . famotidine (PEPCID) 20 MG tablet Take 1 tablet (20 mg total) by mouth 2 (two) times daily.  . Melatonin 10 MG TBCR Take 1 tablet by mouth every evening.  . memantine (NAMENDA) 10 MG tablet Take 10 mg by mouth 2 (two) times daily.  . pantoprazole (PROTONIX) 40 MG tablet Take 1 tablet (40 mg total) by mouth 2 (two) times daily before a meal.  . potassium chloride (KLOR-CON) 10 MEQ tablet Take 1 tablet (10 mEq total) by mouth daily.  . psyllium (METAMUCIL) 58.6 % packet Take 1 packet by mouth daily.  . traZODone (DESYREL) 50 MG tablet Take 1.5 tablets (75 mg total) by mouth at bedtime as needed for sleep.   No facility-administered encounter medications on file as of 02/26/2020.    Thank you for the opportunity to participate in the care of Mr. Kendal Raffo. The palliative care team will continue to follow. Please call our office at [phone] if we can be of additional assistance.  Jari Favre, DNP, AGPCNP-BC

## 2020-02-29 DIAGNOSIS — K219 Gastro-esophageal reflux disease without esophagitis: Secondary | ICD-10-CM | POA: Diagnosis not present

## 2020-02-29 DIAGNOSIS — C187 Malignant neoplasm of sigmoid colon: Secondary | ICD-10-CM | POA: Diagnosis not present

## 2020-02-29 DIAGNOSIS — Z87891 Personal history of nicotine dependence: Secondary | ICD-10-CM | POA: Diagnosis not present

## 2020-02-29 DIAGNOSIS — Z483 Aftercare following surgery for neoplasm: Secondary | ICD-10-CM | POA: Diagnosis not present

## 2020-02-29 DIAGNOSIS — I10 Essential (primary) hypertension: Secondary | ICD-10-CM | POA: Diagnosis not present

## 2020-02-29 DIAGNOSIS — Z4801 Encounter for change or removal of surgical wound dressing: Secondary | ICD-10-CM | POA: Diagnosis not present

## 2020-02-29 DIAGNOSIS — Z8673 Personal history of transient ischemic attack (TIA), and cerebral infarction without residual deficits: Secondary | ICD-10-CM | POA: Diagnosis not present

## 2020-02-29 DIAGNOSIS — K5900 Constipation, unspecified: Secondary | ICD-10-CM | POA: Diagnosis not present

## 2020-02-29 DIAGNOSIS — E876 Hypokalemia: Secondary | ICD-10-CM | POA: Diagnosis not present

## 2020-02-29 DIAGNOSIS — C779 Secondary and unspecified malignant neoplasm of lymph node, unspecified: Secondary | ICD-10-CM | POA: Diagnosis not present

## 2020-02-29 DIAGNOSIS — Z8616 Personal history of COVID-19: Secondary | ICD-10-CM | POA: Diagnosis not present

## 2020-02-29 MED ORDER — HYDROCODONE 5 MG-ACETAMINOPHEN 325 MG TABLET
ORAL_TABLET | Freq: Four times a day (QID) | ORAL | 0 refills | 8 days | Status: CP | PRN
Start: 2020-02-29 — End: ?

## 2020-03-03 ENCOUNTER — Telehealth: Payer: Self-pay | Admitting: *Deleted

## 2020-03-03 NOTE — Telephone Encounter (Signed)
Todd Berg (on DPR) called me back and we discussed the EEG results as noted below by Dr Leonie Man. Todd Berg stated she knows the patient has moderate to severe Alzheimer's. She also stated the patient has stage 3 colon cancer and they were told the chemo could progress the Alzheimer's. Todd Berg is asking if Dr Leonie Man thinks this would indeed affect the Alzheimer's and make it progress? I let her know I would send a message to Dr Leonie Man and call her back.

## 2020-03-03 NOTE — Telephone Encounter (Signed)
Garvin Fila, MD  02/26/2020 4:15 PM EST Back to Top     Kindly inform the patient that brainwave study showed mild slowing of brain activity which can be seen with old-age memory loss or other variety of the conditions but no seizure activity or any worrisome finding was noted

## 2020-03-03 NOTE — Telephone Encounter (Signed)
Called pt's main # on chart (no number for [name] on DPR) and LVM with office number asking for call back.

## 2020-03-04 NOTE — Telephone Encounter (Signed)
It is certainly possible but hard to say wether it is natural h/o ALZ progression versus chemo effect. Chemo effect should be short lived whereas ALZ progression will be long term

## 2020-03-05 ENCOUNTER — Other Ambulatory Visit: Payer: Medicare Other

## 2020-03-05 ENCOUNTER — Other Ambulatory Visit: Payer: Self-pay

## 2020-03-05 DIAGNOSIS — Z515 Encounter for palliative care: Secondary | ICD-10-CM | POA: Diagnosis not present

## 2020-03-05 DIAGNOSIS — Z8616 Personal history of COVID-19: Secondary | ICD-10-CM | POA: Diagnosis not present

## 2020-03-05 DIAGNOSIS — C779 Secondary and unspecified malignant neoplasm of lymph node, unspecified: Secondary | ICD-10-CM | POA: Diagnosis not present

## 2020-03-05 DIAGNOSIS — Z87891 Personal history of nicotine dependence: Secondary | ICD-10-CM | POA: Diagnosis not present

## 2020-03-05 DIAGNOSIS — Z4801 Encounter for change or removal of surgical wound dressing: Secondary | ICD-10-CM | POA: Diagnosis not present

## 2020-03-05 DIAGNOSIS — K5900 Constipation, unspecified: Secondary | ICD-10-CM | POA: Diagnosis not present

## 2020-03-05 DIAGNOSIS — C187 Malignant neoplasm of sigmoid colon: Secondary | ICD-10-CM | POA: Diagnosis not present

## 2020-03-05 DIAGNOSIS — Z8673 Personal history of transient ischemic attack (TIA), and cerebral infarction without residual deficits: Secondary | ICD-10-CM | POA: Diagnosis not present

## 2020-03-05 DIAGNOSIS — K219 Gastro-esophageal reflux disease without esophagitis: Secondary | ICD-10-CM | POA: Diagnosis not present

## 2020-03-05 DIAGNOSIS — E876 Hypokalemia: Secondary | ICD-10-CM | POA: Diagnosis not present

## 2020-03-05 DIAGNOSIS — I10 Essential (primary) hypertension: Secondary | ICD-10-CM | POA: Diagnosis not present

## 2020-03-05 DIAGNOSIS — Z483 Aftercare following surgery for neoplasm: Secondary | ICD-10-CM | POA: Diagnosis not present

## 2020-03-05 NOTE — Progress Notes (Signed)
03/05/2020 @10AM : Palliative care SW visited patient and his family in patient home to assist with HCPOA forms.   SW provided [facility] advance directives form to patient and his daughter, [name]. Patient was coherent and understood purpose of visit. SW provided notary for documents.

## 2020-03-05 NOTE — Telephone Encounter (Signed)
Spoke with pt's daughter and discussed the response from Dr Leonie Man. She verbalized understanding and her questions were answered.

## 2020-03-06 ENCOUNTER — Encounter: Admit: 2020-03-06 | Discharge: 2020-03-07 | Payer: MEDICARE | Attending: Internal Medicine | Primary: Internal Medicine

## 2020-03-06 DIAGNOSIS — C189 Malignant neoplasm of colon, unspecified: Secondary | ICD-10-CM | POA: Diagnosis not present

## 2020-03-07 ENCOUNTER — Ambulatory Visit: Admit: 2020-03-07 | Discharge: 2020-03-07 | Payer: MEDICARE

## 2020-03-07 ENCOUNTER — Encounter: Admit: 2020-03-07 | Discharge: 2020-03-07 | Payer: MEDICARE

## 2020-03-07 DIAGNOSIS — C187 Malignant neoplasm of sigmoid colon: Principal | ICD-10-CM

## 2020-03-07 DIAGNOSIS — C189 Malignant neoplasm of colon, unspecified: Secondary | ICD-10-CM | POA: Diagnosis not present

## 2020-03-07 DIAGNOSIS — Z452 Encounter for adjustment and management of vascular access device: Secondary | ICD-10-CM | POA: Diagnosis not present

## 2020-03-10 ENCOUNTER — Telehealth: Payer: Self-pay | Admitting: Nurse Practitioner

## 2020-03-13 DIAGNOSIS — Z483 Aftercare following surgery for neoplasm: Secondary | ICD-10-CM | POA: Diagnosis not present

## 2020-03-13 DIAGNOSIS — Z4801 Encounter for change or removal of surgical wound dressing: Secondary | ICD-10-CM | POA: Diagnosis not present

## 2020-03-13 DIAGNOSIS — C779 Secondary and unspecified malignant neoplasm of lymph node, unspecified: Secondary | ICD-10-CM | POA: Diagnosis not present

## 2020-03-13 DIAGNOSIS — C187 Malignant neoplasm of sigmoid colon: Secondary | ICD-10-CM | POA: Diagnosis not present

## 2020-03-13 DIAGNOSIS — Z8673 Personal history of transient ischemic attack (TIA), and cerebral infarction without residual deficits: Secondary | ICD-10-CM | POA: Diagnosis not present

## 2020-03-13 DIAGNOSIS — I10 Essential (primary) hypertension: Secondary | ICD-10-CM | POA: Diagnosis not present

## 2020-03-13 DIAGNOSIS — Z87891 Personal history of nicotine dependence: Secondary | ICD-10-CM | POA: Diagnosis not present

## 2020-03-13 DIAGNOSIS — Z8616 Personal history of COVID-19: Secondary | ICD-10-CM | POA: Diagnosis not present

## 2020-03-13 DIAGNOSIS — K5900 Constipation, unspecified: Secondary | ICD-10-CM | POA: Diagnosis not present

## 2020-03-13 DIAGNOSIS — K219 Gastro-esophageal reflux disease without esophagitis: Secondary | ICD-10-CM | POA: Diagnosis not present

## 2020-03-13 DIAGNOSIS — E876 Hypokalemia: Secondary | ICD-10-CM | POA: Diagnosis not present

## 2020-03-14 ENCOUNTER — Telehealth: Payer: Self-pay

## 2020-03-14 NOTE — Telephone Encounter (Signed)
30 minute appt scheduled with Dettinger 03/20/20 to discuss medical equipment.  Since last visit with Dettinger in Jan. Pt has had bowel blockage and diagnosed with Stage III colon cancer per daughter, [name].

## 2020-03-19 ENCOUNTER — Ambulatory Visit: Admit: 2020-03-19 | Discharge: 2020-03-19 | Payer: MEDICARE | Attending: Internal Medicine | Primary: Internal Medicine

## 2020-03-19 ENCOUNTER — Encounter: Admit: 2020-03-19 | Discharge: 2020-03-19 | Payer: MEDICARE

## 2020-03-19 DIAGNOSIS — Z1159 Encounter for screening for other viral diseases: Principal | ICD-10-CM

## 2020-03-19 DIAGNOSIS — C187 Malignant neoplasm of sigmoid colon: Principal | ICD-10-CM

## 2020-03-19 DIAGNOSIS — R140 Abdominal distension (gaseous): Secondary | ICD-10-CM | POA: Diagnosis not present

## 2020-03-19 DIAGNOSIS — F17200 Nicotine dependence, unspecified, uncomplicated: Secondary | ICD-10-CM | POA: Diagnosis not present

## 2020-03-19 DIAGNOSIS — I10 Essential (primary) hypertension: Secondary | ICD-10-CM | POA: Diagnosis not present

## 2020-03-19 DIAGNOSIS — G309 Alzheimer's disease, unspecified: Secondary | ICD-10-CM | POA: Diagnosis not present

## 2020-03-19 DIAGNOSIS — Z5111 Encounter for antineoplastic chemotherapy: Secondary | ICD-10-CM | POA: Diagnosis not present

## 2020-03-19 MED ORDER — POTASSIUM CHLORIDE ER 10 MEQ CAPSULE,EXTENDED RELEASE
ORAL_CAPSULE | Freq: Two times a day (BID) | ORAL | 0 refills | 30.00000 days | Status: CP
Start: 2020-03-19 — End: 2020-04-18

## 2020-03-20 ENCOUNTER — Ambulatory Visit: Payer: Medicare Other | Admitting: Family Medicine

## 2020-03-20 DIAGNOSIS — Z8673 Personal history of transient ischemic attack (TIA), and cerebral infarction without residual deficits: Secondary | ICD-10-CM | POA: Diagnosis not present

## 2020-03-20 DIAGNOSIS — Z8616 Personal history of COVID-19: Secondary | ICD-10-CM | POA: Diagnosis not present

## 2020-03-20 DIAGNOSIS — Z87891 Personal history of nicotine dependence: Secondary | ICD-10-CM | POA: Diagnosis not present

## 2020-03-20 DIAGNOSIS — Z483 Aftercare following surgery for neoplasm: Secondary | ICD-10-CM | POA: Diagnosis not present

## 2020-03-20 DIAGNOSIS — E876 Hypokalemia: Secondary | ICD-10-CM | POA: Diagnosis not present

## 2020-03-20 DIAGNOSIS — Z4801 Encounter for change or removal of surgical wound dressing: Secondary | ICD-10-CM | POA: Diagnosis not present

## 2020-03-20 DIAGNOSIS — C187 Malignant neoplasm of sigmoid colon: Secondary | ICD-10-CM | POA: Diagnosis not present

## 2020-03-20 DIAGNOSIS — I10 Essential (primary) hypertension: Secondary | ICD-10-CM | POA: Diagnosis not present

## 2020-03-20 DIAGNOSIS — K219 Gastro-esophageal reflux disease without esophagitis: Secondary | ICD-10-CM | POA: Diagnosis not present

## 2020-03-20 DIAGNOSIS — C779 Secondary and unspecified malignant neoplasm of lymph node, unspecified: Secondary | ICD-10-CM | POA: Diagnosis not present

## 2020-03-20 DIAGNOSIS — K5900 Constipation, unspecified: Secondary | ICD-10-CM | POA: Diagnosis not present

## 2020-03-21 ENCOUNTER — Telehealth: Payer: Self-pay | Admitting: *Deleted

## 2020-03-21 NOTE — Telephone Encounter (Signed)
VM from [place] w/ [facility] Pt is having decreased appetite, wife is wanting to know if there is an appetite stimulant that can be prescribed He started chemo on Wednesday

## 2020-03-24 MED ORDER — MIRTAZAPINE 15 MG PO TABS: 15.0000 mg | ORAL_TABLET | Freq: Every day | ORAL | 1 refills | Status: AC

## 2020-03-24 NOTE — Addendum Note (Signed)
Addended by: Caryl Pina on: 03/24/2020 09:52 AM   Modules accepted: Orders

## 2020-03-24 NOTE — Telephone Encounter (Signed)
I sent mirtazapine for the patient to see if that could help.  Take it in the evenings, he may have to take it instead of trazodone because it will make him sleepy. Caryl Pina, MD [facility] 03/24/2020, 9:52 AM

## 2020-03-25 ENCOUNTER — Other Ambulatory Visit: Payer: Medicare Other | Admitting: Nurse Practitioner

## 2020-03-25 ENCOUNTER — Other Ambulatory Visit: Payer: Self-pay

## 2020-03-25 DIAGNOSIS — Z515 Encounter for palliative care: Secondary | ICD-10-CM | POA: Diagnosis not present

## 2020-03-25 DIAGNOSIS — R52 Pain, unspecified: Secondary | ICD-10-CM | POA: Diagnosis not present

## 2020-03-25 NOTE — Progress Notes (Addendum)
[facility] Consult Note Telephone: [phone]  Fax: [phone]  PATIENT NAME: Todd Berg [place] [address] [phone] (home)  DOB: 1950/07/13 MRN: [id number]  PRIMARY CARE PROVIDER:    Dettinger, [name], MD,  [place] [address] [phone]  REFERRING PROVIDER:   Dettinger, [name], MD [place],  [place] [address] [phone]  RESPONSIBLE PARTY:   Extended Emergency Contact Information Primary Emergency Contact: Todd Berg Mobile Phone: [phone] Relation: Daughter Preferred language: English Interpreter needed? No  I met face to face with patient and daughter in home.  ASSESSMENT AND RECOMMENDATIONS:   Advance Care Planning: Goal of care: patient's goal of care is comfort while preserving function. Directives: Signed DNR form in home, copy on [facility] [place] EMR.   Symptom Management:  Bilateral knee pain: Patient report pain to bilateral knee and upper leg, unable to quantify pain on scale of 1-10. Described pain as ache and painful. Patient currently on Hydrocodone-Acetaminophen (Norco) 5-331m every 6hrs as needed with some relief. Daughter report patient takes it 1-2 times a day. FROM with some crepitus noted to bilateral knees on exam, no edema, no redness. Recommendation: Recommend stating patient on scheduled Tylenol 10070mby mouth twice a day and continue Norco as needed. Weakness: Likely from poor oral intake and cancer treatment. Daughter report occasional falls from weak lower extremities, saying "his legs sometimes gives out on him". Patient has a bedside commode, daughter report patient has order for Hospital bed- [facility] followed by nutritionist,notes reviewed noted [facility] he takes about 2-3 cans a day to supplement his  meals. He was on Mirtazapine 1555mdaughter report he ran out and needs refill.   Recommendation: continue current plan of care. Phone call made to patient's pharmacy, was made aware that patient has no more refill on Mirtazapine, was told refill request has been sent to patient's PCP today. Patient would need a shower chair, to prevent falls while taking shower. Provided general support and encouragement, no other unmet needs identified at this time. Addendum 03/27/2020 Donated shower chair given to patient per daughter's request. Equipment dropped off at patient's home on 03/26/2020.  Follow up Palliative Care Visit: Palliative care will continue to follow for complex decision making and symptom management. Return in about 4 weeks or prn.  Family /Caregiver/Community Supports: Patient lives with his daughter Todd Smokeo is his main caregiver. He receives home health nurse visits [facility] a week from [facility].  Cognitive / Functional decline: Patient awake, confused and weak, oriented to self only.  Ambulates with standby assist, has a walker that he does not use. Requires assistance getting in and out of bed. Requires assistance with bathing and dressing. No report of recent fall.  I spent 48 minutes providing this consultation. More than 50% of the time in this consultation was spent counseling and coordinating communication.   CHIEF COMPLAINT: Bilateral knee pain  History obtained from review of EMR and discussion with patient and family. Records reviewed and summarized bellow.  HISTORY OF PRESENT ILLNESS:  Todd Berg a 69 59o. year old male with multiple medical problems including vascular dementia (FAST 6d), COPD, GERD, HLD, history of rectal cancer, chronic back pain, hypertension, left thalamic lacunar infarct in May 2019 with no remaining residual deficits (not on Aspirin due to  history of GI bleed). Patient with diagnosis with stage 3 colon cancer January 2022, his biopsy was  positive for moderately differentiated invasive adenocarcinoma,lymphovascular invasion extensively present and 9/12 lymph nodes positive for metastatic carcinoma. Patient also noted with pulmonary nodule CT chest 01/20/2020 that is in determinant. Patient is followed by Oncologist Dr. Ricki Rodriguez of [facility]. Patient is s/p laparoscopic sigmoid colectomy on 01/25/2020. Started Adjuvant Chemotherapy on 03/19/2020, s/p 3 infusions. Patient tolerating chemo without report of nausea, vomiting, diarrhea or constipation. Palliative Care was asked to help address advance care planning and goals of care. This is a follow up visit.  CODE STATUS: DNR  PPS: 50%  HOSPICE ELIGIBILITY/DIAGNOSIS: TBD  ROS/staff/patient   Constitutional: denied fever, denied chills, endorsed fatigue EYES: denies vision changes ENMT: denies dysphagia Cardiovascular: denies chest pain, denies palpitation Pulmonary: denies cough, denies SOB Abdomen: endorses poor appetite, denies constipation, denies incontinence of bowel GU: denies dysuria, endorses incontinence of urine MSK:  endorses ROM limitations, endorses falls  Skin: denies rashes or wounds Neurological: endorses weakness, endorses insomnia Psych: Endorses positive mood Heme/lymph/immuno: denies bruises, abnormal bleeding   Physical Exam: Vital signs: BP I42/90, P 70, RR 16, Sats 97% on room air Current and past weights: 199lbs down from 202lbs on 02/20/2020, BMI 27.06kg/m2 General: frail appearing, cooperative, sitting on a couch in NAD EYES: anicteric sclera, lids intact, no discharge  ENMT: intact hearing, oral mucous membranes moist CV:  no LE edema Pulmonary: no increased work of breathing, no cough, no audible wheezes, room air Abdomen: intake 25-50%,  no ascites GU: deferred MSK:  no contractures of LE,  ambulatory Skin: warm and dry, no rashes or wounds on visible skin Neuro: Generalized weakness, mild cognitive impairment Psych: non-anxious  affect today, A and O x 2 Hem/lymph/immuno: no widespread bruising   PAST MEDICAL HISTORY:  Past Medical History:  Diagnosis Date  . Cancer ([place]) 2015   rectal  . Chronic back pain   . COPD (chronic obstructive pulmonary disease) ([place])   . Dementia ([place])   . GERD (gastroesophageal reflux disease)   . Hematemesis   . Hemorrhoids   . Hyperlipidemia   . Hypotension   . Melena   . Melena 10/12/2017  . Stroke [facility]) 2019    SOCIAL HX:  Social History   Tobacco Use  . Smoking status: Former Smoker    Start date: 12/30/1964    Quit date: 06/19/2009    Years since quitting: 10.7  . Smokeless tobacco: Former Systems developer    Types: Snuff    Quit date: 09/26/2005  Substance Use Topics  . Alcohol use: No   FAMILY HX:  Family History  Problem Relation Age of Onset  . COPD Mother   . Obesity Mother   . Stroke Father     ALLERGIES: No Known Allergies   PERTINENT MEDICATIONS:  Outpatient Encounter Medications as of 03/25/2020  Medication Sig  . amLODipine (NORVASC) 2.5 MG tablet TAKE 1 TABLET ONCE DIALY (Patient taking differently: Take 2.5 mg by mouth daily.)  . cetirizine (ZYRTEC) 10 MG tablet Take 1 tablet (10 mg total) by mouth daily. For itching  . Cyanocobalamin (VITAMIN B-12) 5000 MCG SUBL DISSOLVE 1 TABLET UNDER TONGUE DAILY  . diphenhydramine-acetaminophen (TYLENOL PM) 25-500 MG TABS tablet Take 2 tablets by mouth at bedtime as needed (mild pain).  Marland Kitchen docusate sodium (COLACE) 100 MG capsule Take 1 capsule (100 mg total) by mouth daily as needed for mild constipation.  . famotidine (PEPCID) 20 MG tablet  Take 1 tablet (20 mg total) by mouth 2 (two) times daily.  . Melatonin 10 MG TBCR Take 1 tablet by mouth every evening.  . memantine (NAMENDA) 10 MG tablet Take 10 mg by mouth 2 (two) times daily.  . mirtazapine (REMERON) 15 MG tablet Take 1 tablet (15 mg total) by mouth at bedtime.  . pantoprazole (PROTONIX) 40 MG tablet Take 1 tablet (40 mg total) by mouth 2 (two) times daily before  a meal.  . potassium chloride (KLOR-CON) 10 MEQ tablet Take 1 tablet (10 mEq total) by mouth daily.  . psyllium (METAMUCIL) 58.6 % packet Take 1 packet by mouth daily.  . traZODone (DESYREL) 50 MG tablet Take 1.5 tablets (75 mg total) by mouth at bedtime as needed for sleep.   No facility-administered encounter medications on file as of 03/25/2020.    Thank you for the opportunity to participate in the care of Mr Kevante Lunt. The palliative care team will continue to follow. Please call our office at [phone] if we can be of additional assistance.  Jari Favre, DNP, AGPCNP-BC

## 2020-03-26 DIAGNOSIS — C187 Malignant neoplasm of sigmoid colon: Principal | ICD-10-CM

## 2020-03-27 DIAGNOSIS — I10 Essential (primary) hypertension: Secondary | ICD-10-CM | POA: Diagnosis not present

## 2020-03-27 DIAGNOSIS — Z483 Aftercare following surgery for neoplasm: Secondary | ICD-10-CM | POA: Diagnosis not present

## 2020-03-27 DIAGNOSIS — C187 Malignant neoplasm of sigmoid colon: Secondary | ICD-10-CM | POA: Diagnosis not present

## 2020-03-27 DIAGNOSIS — Z8673 Personal history of transient ischemic attack (TIA), and cerebral infarction without residual deficits: Secondary | ICD-10-CM | POA: Diagnosis not present

## 2020-03-27 DIAGNOSIS — E876 Hypokalemia: Secondary | ICD-10-CM | POA: Diagnosis not present

## 2020-03-27 DIAGNOSIS — K219 Gastro-esophageal reflux disease without esophagitis: Secondary | ICD-10-CM | POA: Diagnosis not present

## 2020-03-27 DIAGNOSIS — Z4801 Encounter for change or removal of surgical wound dressing: Secondary | ICD-10-CM | POA: Diagnosis not present

## 2020-03-27 DIAGNOSIS — Z8616 Personal history of COVID-19: Secondary | ICD-10-CM | POA: Diagnosis not present

## 2020-03-27 DIAGNOSIS — K5900 Constipation, unspecified: Secondary | ICD-10-CM | POA: Diagnosis not present

## 2020-03-27 DIAGNOSIS — C779 Secondary and unspecified malignant neoplasm of lymph node, unspecified: Secondary | ICD-10-CM | POA: Diagnosis not present

## 2020-03-27 DIAGNOSIS — Z87891 Personal history of nicotine dependence: Secondary | ICD-10-CM | POA: Diagnosis not present

## 2020-03-29 ENCOUNTER — Ambulatory Visit
Admit: 2020-03-29 | Discharge: 2020-03-30 | Disposition: A | Discharge status: Home / Self Care | Payer: MEDICARE | Admitting: Family Medicine

## 2020-03-29 DIAGNOSIS — E871 Hypo-osmolality and hyponatremia: Secondary | ICD-10-CM | POA: Diagnosis not present

## 2020-03-29 DIAGNOSIS — G9389 Other specified disorders of brain: Secondary | ICD-10-CM | POA: Diagnosis not present

## 2020-03-29 DIAGNOSIS — I6789 Other cerebrovascular disease: Secondary | ICD-10-CM | POA: Diagnosis not present

## 2020-03-29 DIAGNOSIS — E876 Hypokalemia: Secondary | ICD-10-CM | POA: Diagnosis not present

## 2020-03-29 DIAGNOSIS — Z20822 Contact with and (suspected) exposure to covid-19: Secondary | ICD-10-CM | POA: Diagnosis not present

## 2020-03-29 DIAGNOSIS — I10 Essential (primary) hypertension: Secondary | ICD-10-CM | POA: Diagnosis not present

## 2020-03-29 DIAGNOSIS — C187 Malignant neoplasm of sigmoid colon: Secondary | ICD-10-CM | POA: Diagnosis not present

## 2020-03-29 DIAGNOSIS — U071 COVID-19: Secondary | ICD-10-CM | POA: Diagnosis not present

## 2020-03-29 DIAGNOSIS — R509 Fever, unspecified: Secondary | ICD-10-CM | POA: Diagnosis not present

## 2020-03-29 DIAGNOSIS — N390 Urinary tract infection, site not specified: Secondary | ICD-10-CM | POA: Diagnosis not present

## 2020-03-29 DIAGNOSIS — Z87891 Personal history of nicotine dependence: Secondary | ICD-10-CM | POA: Diagnosis not present

## 2020-03-29 DIAGNOSIS — Z7901 Long term (current) use of anticoagulants: Secondary | ICD-10-CM | POA: Diagnosis not present

## 2020-03-29 DIAGNOSIS — K219 Gastro-esophageal reflux disease without esophagitis: Secondary | ICD-10-CM | POA: Diagnosis not present

## 2020-03-29 DIAGNOSIS — Z8616 Personal history of COVID-19: Secondary | ICD-10-CM | POA: Diagnosis not present

## 2020-03-29 DIAGNOSIS — Z8673 Personal history of transient ischemic attack (TIA), and cerebral infarction without residual deficits: Secondary | ICD-10-CM | POA: Diagnosis not present

## 2020-03-29 DIAGNOSIS — A419 Sepsis, unspecified organism: Secondary | ICD-10-CM | POA: Diagnosis not present

## 2020-03-29 DIAGNOSIS — Z79899 Other long term (current) drug therapy: Secondary | ICD-10-CM | POA: Diagnosis not present

## 2020-03-29 DIAGNOSIS — R410 Disorientation, unspecified: Secondary | ICD-10-CM | POA: Diagnosis not present

## 2020-03-29 DIAGNOSIS — C189 Malignant neoplasm of colon, unspecified: Secondary | ICD-10-CM | POA: Diagnosis not present

## 2020-03-29 DIAGNOSIS — Z66 Do not resuscitate: Secondary | ICD-10-CM | POA: Diagnosis not present

## 2020-03-31 DIAGNOSIS — C187 Malignant neoplasm of sigmoid colon: Principal | ICD-10-CM

## 2020-03-31 DIAGNOSIS — Z1159 Encounter for screening for other viral diseases: Principal | ICD-10-CM

## 2020-03-31 DIAGNOSIS — K5900 Constipation, unspecified: Secondary | ICD-10-CM | POA: Diagnosis not present

## 2020-03-31 DIAGNOSIS — Z87891 Personal history of nicotine dependence: Secondary | ICD-10-CM | POA: Diagnosis not present

## 2020-03-31 DIAGNOSIS — I10 Essential (primary) hypertension: Secondary | ICD-10-CM | POA: Diagnosis not present

## 2020-03-31 DIAGNOSIS — C779 Secondary and unspecified malignant neoplasm of lymph node, unspecified: Secondary | ICD-10-CM | POA: Diagnosis not present

## 2020-03-31 DIAGNOSIS — Z483 Aftercare following surgery for neoplasm: Secondary | ICD-10-CM | POA: Diagnosis not present

## 2020-03-31 DIAGNOSIS — K219 Gastro-esophageal reflux disease without esophagitis: Secondary | ICD-10-CM | POA: Diagnosis not present

## 2020-03-31 DIAGNOSIS — Z8673 Personal history of transient ischemic attack (TIA), and cerebral infarction without residual deficits: Secondary | ICD-10-CM | POA: Diagnosis not present

## 2020-03-31 DIAGNOSIS — Z8616 Personal history of COVID-19: Secondary | ICD-10-CM | POA: Diagnosis not present

## 2020-03-31 DIAGNOSIS — Z4801 Encounter for change or removal of surgical wound dressing: Secondary | ICD-10-CM | POA: Diagnosis not present

## 2020-03-31 DIAGNOSIS — E876 Hypokalemia: Secondary | ICD-10-CM | POA: Diagnosis not present

## 2020-04-01 ENCOUNTER — Ambulatory Visit: Payer: Medicare Other | Admitting: Adult Health

## 2020-04-01 ENCOUNTER — Telehealth: Payer: Self-pay | Admitting: *Deleted

## 2020-04-01 DIAGNOSIS — C187 Malignant neoplasm of sigmoid colon: Principal | ICD-10-CM

## 2020-04-01 MED ORDER — CAPECITABINE 150 MG TABLET
ORAL_TABLET | Freq: Two times a day (BID) | ORAL | 6 refills | 14.00000 days | Status: CP
Start: 2020-04-01 — End: ?
  Filled 2020-04-01: qty 84, 21d supply, fill #0
  Filled 2020-04-01: qty 56, 21d supply, fill #0

## 2020-04-01 MED ORDER — CAPECITABINE 500 MG TABLET
ORAL_TABLET | Freq: Two times a day (BID) | ORAL | 6 refills | 14 days | Status: CP
Start: 2020-04-01 — End: ?

## 2020-04-01 MED ORDER — ALBUTEROL SULFATE HFA 90 MCG/ACTUATION AEROSOL INHALER
0 days
Start: 2020-04-01 — End: ?

## 2020-04-01 MED ORDER — ALBUTEROL SULFATE HFA 108 (90 BASE) MCG/ACT IN AERS: 2.0000 | INHALATION_SPRAY | Freq: Four times a day (QID) | RESPIRATORY_TRACT | 0 refills | Status: AC | PRN

## 2020-04-01 NOTE — Telephone Encounter (Signed)
Ok to send Albuterol HFA 2 puffs every 6 hours as needed for wheezing/ SOB.  However, I reviewed the hospital note and it stated pt returned to baseline after IV fluids.  If he is having difficulty breathing, consider reevaluation during [facility] clinic vs evaluation at [facility] for repeat pulmonary check.

## 2020-04-01 NOTE — Telephone Encounter (Signed)
LM detailed with [facility]  - Stephanie. Aware will send in alb hfa and if worse needs covid clinic appt ( to call)

## 2020-04-01 NOTE — Addendum Note (Signed)
Addended by: Zannie Cove on: 04/01/2020 02:26 PM   Modules accepted: Orders

## 2020-04-01 NOTE — Telephone Encounter (Signed)
Vm from [place] w/ [facility] Pt was seen at [facility] over weekend for UTI, and daughter said pneumonia No ABX was prescribed, maybe waiting on culture, pt does have wheezing in all lobes, was wanting to know if he could get an inhaler

## 2020-04-01 NOTE — Unmapped (Signed)
[facility]   Patient Onboarding/Medication Counseling    Spoke with daughter Danny Hunt - treatment is delayed. She is aware to start capecitabine in the evening of the 1st day of infusion.    Danny Hunt is a 70 y.o. male with Malignant neoplasm of sigmoid colon who I am counseling today on initiation of therapy.  I am speaking to the patient's family member, daughter Danny Hunt.    Was a Nurse, learning disability used for this call? No    Verified patient's date of birth / HIPAA.    Specialty medication(s) to be sent: Hematology/Oncology: Capecitabine 150mg , directions: See below and Capecitabine 500mg , directions: Take 3 tablets (1,500 mg) of the 500 mg strength and 2 tablets (300 mg total) of th 150 mg strength by mouth Two (2) times a day (1,800 mg total dose). Take Days 1 through 14, then 1 week off.    Non-specialty medications/supplies to be sent: none    Medications not needed at this time: none     Xeloda (capecitabine)    Medication & Administration     Dosage: Hematology/Oncology: Take 3 tablets (1,500 mg) of the 500 mg strength and 2 tablets (300 mg total) of th 150 mg strength by mouth Two (2) times a day (1,800 mg total dose). Take Days 1 through 14, then 1 week off.    Administration: I reviewed the importance of taking with a full glass of water within 30 minutes of a meal (at least 1 cup of food). Discussed that tablet should be swallowed whole and cannot be crushed or chewed.    Adherence/Missed dose instruction: If a dose is missed, do not take an extra dose or two doses at one time. Simply take your next dose at the regularly scheduled time and record any missed doses so our team is aware.    Goals of Therapy     Prevent disease progression    Side Effects & Monitoring Parameters     ??? Nausea/vomiting  ??? Diarrhea/constipation  ??? Infection precautions  ??? Fatigue  ??? Mouth sores or irritation  ??? Hand/foot syndrome (tingling, numbness, pain, redness, peeling or blistering) Use Urea 20% cream, Aquaphor, Eucerin, Cetaphil, or CeraVe twice daily on hands and feet as preventative  ??? Sun precautions  ??? Decrease appetite/ taste changes  ??? Bleeding precautions (bruising easily, nose bleeds, gums bleed)  ??? Headache  ??? Dry skin  ??? Hair loss    The following side effects should be reported to the provider:  ??? Diarrhea not controlled by anti-diarrheals  ??? Swelling, warmth, numbness, change of color or pain in a leg or arm  ??? Signs of infection (fever >100.4, chills, sore throat, sputum production)  ??? Signs of liver problems (dark urine, abdominal pain, light-colored stools, vomiting, yellow skin or eyes)  ??? Signs of bleeding (vomiting or coughing up blood, blood that looks like coffee grounds, blood in the urine or black, red tarry stools, bruising that gets bigger without reason, any persistent or severe bleeding)  ??? Skin rash or signs of skin infection (cracking, peeling, blistering, bleeding skin, red or irritated eyes)  ??? Signs of fluid and electrolyte problems (mood changes, confusion, abnormal/fast  heartbeat, severe dizziness, passing out, increased thirst, seizures, loss of strength and energy, lack of appetite, unable to pass urine or change in amount of urine passed, dry mouth, dry eyes, or nausea or vomiting.  ??? Signs of hand foot syndrome (redness or irritation on the palms of the hands or soles of  the feet)  ??? Signs of mucositis (red or swollen mouth/gums, sores in the mouth, gums or tongue, soreness or pain in the mouth or throat, difficulty swallowing or talking, dryness, mild burning, or pain when eating food white patches or pus in the mouth or on the tongue, increased mucus or thicker saliva)  ??? Signs of anaphylaxis (wheezing, chest tightness, swelling of face, lips, tongue or throat)    Monitoring parameters: Renal function should be estimated at baseline to determine initial dose. During therapy, CBC with differential, hepatic function, and renal function should be monitored. Monitor INR closely if receiving concomitant warfarin. Pregnancy test prior to treatment initiation (in females of reproductive potential). Monitor for diarrhea, dehydration, hand-foot syndrome, Stevens-Johnson syndrome, toxic epidermal necrolysis, stomatitis, and cardiotoxicity. Monitor adherence.    Contraindications, Warnings & Precautions     [place] Boxed Warning: Capecitabine may increase the anticoagulant effects of warfarin; bleeding events, including death, have occurred with concomitant use. Clinically significant increases in prothrombin time (PT) and INR have occurred within several days to months after capecitabine initiation (in patients previously stabilized on anticoagulants), and may continue up to 1 month after capecitabine discontinuation. May occur in patients with or without liver metastases. Monitor PT and INR frequently and adjust anticoagulation dosing accordingly. An increased risk of coagulopathy is correlated with a cancer diagnosis and age >60 years.    Contraindications:  ??? Known hypersensitivity to capecitabine, fluorouracil, or any component of the formulation;   ??? Severe renal impairment (CrCl <30 mL/minute)    Warnings and Precautions:  ??? Bone marrow suppression  ??? Cardiotoxicity: Myocardial infarction, ischemia, angina, dysrhythmias, cardiac arrest, cardiac failure, sudden death, ECG changes, and cardiomyopathy  ??? Mouth sores-discussed use of baking soda/salt water rinses  ??? Dermatologic toxicity: Stevens-Johnson syndrome and toxic epidermal necrolysis   ??? Hand/foot prevention (moisturizers, luke warm hand washes, patting hands/feet dry, decrease rubbing on hands/feet)  ??? GI toxicity: May cause diarrhea (may be severe); Importance of good nutrition to help minimize diarrhea (high protein, BRAT, yogurt, avoid greasy and spicy foods).  Importance of hydration if having frequent diarrhea  ??? Reproductive concerns: Evaluate pregnancy status prior to therapy in females of reproductive potential. Females of reproductive potential should use effective contraception during treatment and for 6 months after the last dose. Males with male partners of reproductive potential should use effective contraception during treatment and for 3 months after the last dose. Based on the mechanism of action and data from animal reproduction studies, in utero exposure to capecitabine may cause fetal harm.   ??? Breast feeding considerations: It is not known if capecitabine is present in breast milk. Due to the potential for serious adverse reactions in the breastfed infant, breastfeeding is not recommended by the manufacturer during treatment and for 2 weeks after the last dose.    Drug/Food Interactions     ??? Medication list reviewed in Epic. The patient was instructed to inform the care team before taking any new medications or supplements. No drug interactions identified.   ??? Avoid live vaccines.    Storage, Handling Precautions, & Disposal     ??? This medication should be stored at room temperature and in a dry location. Keep out of reach of others including children and pets. Keep the medicine in the original container with a child-proof top (no pillboxes). Do not throw away or flush unused medication down the toilet or sink. This drug is considered hazardous and should be handled as little as possible.  If someone else  helps with medication administration, they should wear gloves.    Current Medications (including OTC/herbals), Comorbidities and Allergies     Current Outpatient Medications   Medication Sig Dispense Refill   ??? amLODIPine (NORVASC) 2.5 MG tablet Take 2 tablets (5 mg total) by mouth daily. 60 tablet 3   ??? capecitabine (XELODA) 150 MG tablet Take 2 tablets (300 mg total) by mouth Two (2) times a day  with 1 other capecitabine prescription for 1,800 mg total. Take Days 1 through 14, then 1 week off. 56 tablet 6   ??? capecitabine (XELODA) 500 MG tablet Take 3 tablets (1,500 mg total) by mouth Two (2) times a day  with 1 other capecitabine prescription for 1,800 mg total. Take Days 1 through 14, then 1 week off. 84 tablet 6   ??? cetirizine (ZYRTEC) 10 MG tablet Take 10 mg by mouth.     ??? diphenhydrAMINE-acetaminophen (TYLENOL PM) 25-500 mg Tab Take 2 tablets by mouth.     ??? docusate sodium (COLACE) 100 MG capsule Take 100 mg by mouth.      ??? famotidine (PEPCID) 20 MG tablet Take 20 mg by mouth.     ??? heparin, porcine, PF, 100 unit/mL Syrg Infuse 5 mL into a venous catheter every fourteen (14) days. For use while patient is on fluorouracil (5-FU) home infusion.   Flush IV catheter with heparin 5 mL as directed after final saline flush per SASH method and as needed for line maintenance. 4 each PRN   ??? HYDROcodone-acetaminophen (NORCO) 5-325 mg per tablet Take 1 tablet by mouth every six (6) hours as needed for pain. 30 tablet 0   ??? loperamide (IMODIUM) 2 mg capsule Take 2 capsules to start, then 1 capsule every 2 hours until diarrhea free for 12 hours. 60 capsule 11   ??? melatonin 10 mg TbMP Take 1 tablet by mouth every evening.     ??? memantine (NAMENDA) 10 MG tablet Take 10 mg by mouth Two (2) times a day.     ??? mirtazapine (REMERON) 15 MG tablet Take 15 mg by mouth.     ??? ondansetron (ZOFRAN) 8 MG tablet Take 1 tablet (8 mg total) by mouth every eight (8) hours as needed for nausea. 30 tablet 2   ??? oxyCODONE (ROXICODONE) 5 MG immediate release tablet Take 1/2 tablet (2.5mg ) by mouth every 4-6 hours as needed for pain 10 tablet 0   ??? potassium chloride (MICRO-K) 10 mEq CR capsule Take 1 capsule (10 mEq total) by mouth Two (2) times a day. 60 capsule 0   ??? prochlorperazine (COMPAZINE) 10 MG tablet Take 1 tablet (10 mg total) by mouth every six (6) hours as needed (nausea). 30 tablet 2   ??? sodium chloride (NS) 0.9 % injection Infuse 10 mL into a venous catheter every fourteen (14) days. For use while patient is on fluorouracil (5-FU) home infusion.   Flush IV catheter with normal saline 10 mL prior to and after infusion followed by heparin flush per SASH method and as needed for line maintenance. 6 each PRN   ??? traZODone (DESYREL) 50 MG tablet Take 75 mg by mouth.       No current facility-administered medications for this visit.       No Known Allergies    Patient Active Problem List   Diagnosis   ??? Gallbladder polyp   ??? GERD (gastroesophageal reflux disease)   ??? Hypertension   ??? H/O: CVA (cerebrovascular accident)   ??? History of  tobacco abuse   ??? Large bowel obstruction (CMS-HCC)   ??? Malignant neoplasm of sigmoid colon (CMS-HCC)   ??? Encounter for screening for other viral diseases    ??? Anemia   ??? Belching   ??? Chronic back pain   ??? COPD (chronic obstructive pulmonary disease) (CMS-HCC)   ??? Degenerative disc disease, lumbar   ??? History of malignant neoplasm of colon   ??? Generalized anxiety disorder   ??? Iron deficiency anemia due to chronic blood loss   ??? Left upper quadrant pain   ??? Melena   ??? Malignant tumor of colon (CMS-HCC)   ??? Altered mental status   ??? Hyponatremia       Reviewed and up to date in Epic.    Appropriateness of Therapy     Acute infection status:  No active infections  If patient is seen at an outside health-system, please confirm with patient if they have an acute infection.    Is medication and dose appropriate based on diagnosis and infection status? Yes    Prescription has been clinically reviewed: Yes      Baseline Quality of Life Assessment      How many days over the past month did your Malignant neoplasm of sigmoid colon  keep you from your normal activities? For example, brushing your teeth or getting up in the morning. 0    Financial Information     Medication Assistance provided: None Required    Anticipated copay of $16.33 (150 mg strength) and $29.51 (500 mg strength) / 21 days reviewed with patient. Verified delivery address.    Delivery Information     Scheduled delivery date: 04/02/20    Expected start date: 04/03/20    Medication will be delivered via UPS to the prescription address in [facility].  This shipment will not require a signature.      Explained the services we provide at [facility] and that each month we would call to set up refills.  Stressed importance of returning phone calls so that we could ensure they receive their medications in time each month.  Informed patient that we should be setting up refills 7-10 days prior to when they will run out of medication.  A pharmacist will reach out to perform a clinical assessment periodically.  Informed patient that a welcome packet, containing information about our pharmacy and other support services, a Notice of Privacy Practices, and a drug information handout will be sent.      Patient verbalized understanding of the above information as well as how to contact the pharmacy at [phone] option [phone] with any questions/concerns.  The pharmacy is open Monday through Friday 8:30am-4:30pm.  A pharmacist is available 24/7 via pager to answer any clinical questions they may have.    Patient Specific Needs     - Does the patient have any physical, cognitive, or cultural barriers? No    - Patient prefers to have medications discussed with  Family Member     - Is the patient or caregiver able to read and understand education materials at a high school level or above? Yes    - Patient's primary language is  English     - Is the patient high risk? Yes, patient is taking oral chemotherapy. Appropriateness of therapy as been assessed    - Does the patient require a Care Management Plan? No     - Does the patient require physician intervention or other additional services (i.e.  nutrition, smoking cessation, social work)? No      Kashayla Ungerer [facility] Specialty Pharmacist

## 2020-04-01 NOTE — Unmapped (Signed)
[facility] Specialty Medication Onboarding    Specialty Medication: Capecitabine 150mg  tablets  Prior Authorization: Not Required   Financial Assistance: No - copay  <$25  Final Copay/Day Supply: $16.33 / 21 days    Insurance Restrictions: Yes - max 1 month supply      Notes to Pharmacist:     The triage team has completed the benefits investigation and has determined that the patient is able to fill this medication at [facility]. Please contact the patient to complete the onboarding or follow up with the prescribing physician as needed.  Marland Kitchen  [facility] Specialty Medication Onboarding    Specialty Medication: Capecitabine 500mg  tablets  Prior Authorization: Not Required   Financial Assistance: No - copay  <$25  Final Copay/Day Supply: $29.51 / 21 days    Insurance Restrictions: None     Notes to Pharmacist:     The triage team has completed the benefits investigation and has determined that the patient is able to fill this medication at [facility]. Please contact the patient to complete the onboarding or follow up with the prescribing physician as needed.

## 2020-04-01 NOTE — Unmapped (Signed)
Hello ladies,  Please reach out to this pt's daughter to reschedule.    Thanks,  [employer]

## 2020-04-02 NOTE — Unmapped (Signed)
Called and spoke to patient's daughter, Babette Relic. [name] states that on Saturday, patient was freezing and couldn't move his right arm, was shaking. Transported to hospital in ambulance. He spent one night in hospital and was discharged. [name] states she was informed that patient had UTI and pneumonia. They were not prescribed any antibiotics, advised that they were waiting on lab results. Home health NP came to home on Monday and stated that she was going to look into antibiotics and also setting up MD visit with PCP for follow up and inhaler prescription. [name] canceled patient's infusion appointment as advised by home health NP.     NN expressed concern to [name] that if patient was diagnosed with these infections, he should be on antibiotics. Advised her to call home health RN to check on status and then follow up with NN today. Knowing the course of antibiotics will allow [place] to reschedule chemotherapy appointment and oncology follow up appropriately. [name] verbalized understanding and was in agreement with the plan. She expressed appreciation for the call.

## 2020-04-03 DIAGNOSIS — C187 Malignant neoplasm of sigmoid colon: Principal | ICD-10-CM

## 2020-04-03 DIAGNOSIS — K5900 Constipation, unspecified: Secondary | ICD-10-CM | POA: Diagnosis not present

## 2020-04-03 DIAGNOSIS — I10 Essential (primary) hypertension: Secondary | ICD-10-CM | POA: Diagnosis not present

## 2020-04-03 DIAGNOSIS — K219 Gastro-esophageal reflux disease without esophagitis: Secondary | ICD-10-CM | POA: Diagnosis not present

## 2020-04-03 DIAGNOSIS — Z483 Aftercare following surgery for neoplasm: Secondary | ICD-10-CM | POA: Diagnosis not present

## 2020-04-03 DIAGNOSIS — E876 Hypokalemia: Secondary | ICD-10-CM | POA: Diagnosis not present

## 2020-04-03 DIAGNOSIS — Z4801 Encounter for change or removal of surgical wound dressing: Secondary | ICD-10-CM | POA: Diagnosis not present

## 2020-04-03 DIAGNOSIS — Z8673 Personal history of transient ischemic attack (TIA), and cerebral infarction without residual deficits: Secondary | ICD-10-CM | POA: Diagnosis not present

## 2020-04-03 DIAGNOSIS — Z8616 Personal history of COVID-19: Secondary | ICD-10-CM | POA: Diagnosis not present

## 2020-04-03 DIAGNOSIS — Z87891 Personal history of nicotine dependence: Secondary | ICD-10-CM | POA: Diagnosis not present

## 2020-04-03 DIAGNOSIS — C779 Secondary and unspecified malignant neoplasm of lymph node, unspecified: Secondary | ICD-10-CM | POA: Diagnosis not present

## 2020-04-03 NOTE — Unmapped (Signed)
Called and spoke to patient's daughter, Danny Hunt, to check in about hospital discharge follow up and PCP follow up. Patient states that she reach out to PCP and they are supposed to set up an appointment with PCP next week. She states that the hospital was supposed to call with results (about antibiotics), and she has not heard from them. I encouraged Danny Hunt to call phone number listed on discharge paperwork to follow up. She said she would. Patient states that home health NP mentioned referral to pain clinic or pain management as patient is in chronic pain. I encouraged Danny Hunt to reach out to PCP about pain management or a referral. They have a palliative care RN that comes out once per month.     Danny Hunt would prefer to keep scheduled appointment on 4/6 for MD visit + Infusion as patient is supposed to follow up with PCP next week. NN will let care team know.     Danny Hunt states that medical supply company is sending a bed lift but she does not need that at this time. She is requesting a Metallurgist chair. I said that I would reach out to the supply company about this request.     Order for Total Chair Lift faxed to [employer]  Phone: [phone]  Fax: [phone]

## 2020-04-04 ENCOUNTER — Telehealth: Payer: Self-pay | Admitting: *Deleted

## 2020-04-04 DIAGNOSIS — M545 Low back pain, unspecified: Secondary | ICD-10-CM

## 2020-04-04 DIAGNOSIS — M5136 Other intervertebral disc degeneration, lumbar region: Secondary | ICD-10-CM

## 2020-04-04 DIAGNOSIS — C187 Malignant neoplasm of sigmoid colon: Secondary | ICD-10-CM

## 2020-04-04 NOTE — Telephone Encounter (Signed)
VM from [place] w/ [facility] pt had a fall on the 20th no injuries he was was ambulating without his walker Famiy would like referral to pain clinic, he is having pain in legs & rectal area

## 2020-04-07 DIAGNOSIS — C187 Malignant neoplasm of sigmoid colon: Secondary | ICD-10-CM | POA: Diagnosis not present

## 2020-04-08 DIAGNOSIS — I10 Essential (primary) hypertension: Secondary | ICD-10-CM | POA: Diagnosis not present

## 2020-04-08 DIAGNOSIS — K219 Gastro-esophageal reflux disease without esophagitis: Secondary | ICD-10-CM | POA: Diagnosis not present

## 2020-04-08 DIAGNOSIS — E871 Hypo-osmolality and hyponatremia: Secondary | ICD-10-CM | POA: Diagnosis not present

## 2020-04-08 DIAGNOSIS — Z8673 Personal history of transient ischemic attack (TIA), and cerebral infarction without residual deficits: Secondary | ICD-10-CM | POA: Diagnosis not present

## 2020-04-08 DIAGNOSIS — N390 Urinary tract infection, site not specified: Secondary | ICD-10-CM | POA: Diagnosis not present

## 2020-04-08 DIAGNOSIS — C187 Malignant neoplasm of sigmoid colon: Secondary | ICD-10-CM | POA: Diagnosis not present

## 2020-04-08 DIAGNOSIS — Z7901 Long term (current) use of anticoagulants: Secondary | ICD-10-CM | POA: Diagnosis not present

## 2020-04-08 DIAGNOSIS — Z87891 Personal history of nicotine dependence: Secondary | ICD-10-CM | POA: Diagnosis not present

## 2020-04-08 DIAGNOSIS — Z8616 Personal history of COVID-19: Secondary | ICD-10-CM | POA: Diagnosis not present

## 2020-04-08 DIAGNOSIS — C779 Secondary and unspecified malignant neoplasm of lymph node, unspecified: Secondary | ICD-10-CM | POA: Diagnosis not present

## 2020-04-08 DIAGNOSIS — Z9049 Acquired absence of other specified parts of digestive tract: Secondary | ICD-10-CM | POA: Diagnosis not present

## 2020-04-09 NOTE — Addendum Note (Signed)
Addended by: Caryl Pina on: 04/09/2020 07:56 AM   Modules accepted: Orders

## 2020-04-09 NOTE — Telephone Encounter (Signed)
Placed referral to pain management

## 2020-04-11 DIAGNOSIS — C779 Secondary and unspecified malignant neoplasm of lymph node, unspecified: Secondary | ICD-10-CM | POA: Diagnosis not present

## 2020-04-11 DIAGNOSIS — K219 Gastro-esophageal reflux disease without esophagitis: Secondary | ICD-10-CM | POA: Diagnosis not present

## 2020-04-11 DIAGNOSIS — Z87891 Personal history of nicotine dependence: Secondary | ICD-10-CM | POA: Diagnosis not present

## 2020-04-11 DIAGNOSIS — Z8616 Personal history of COVID-19: Secondary | ICD-10-CM | POA: Diagnosis not present

## 2020-04-11 DIAGNOSIS — Z8673 Personal history of transient ischemic attack (TIA), and cerebral infarction without residual deficits: Secondary | ICD-10-CM | POA: Diagnosis not present

## 2020-04-11 DIAGNOSIS — I10 Essential (primary) hypertension: Secondary | ICD-10-CM | POA: Diagnosis not present

## 2020-04-11 DIAGNOSIS — Z7901 Long term (current) use of anticoagulants: Secondary | ICD-10-CM | POA: Diagnosis not present

## 2020-04-11 DIAGNOSIS — E871 Hypo-osmolality and hyponatremia: Secondary | ICD-10-CM | POA: Diagnosis not present

## 2020-04-11 DIAGNOSIS — Z9049 Acquired absence of other specified parts of digestive tract: Secondary | ICD-10-CM | POA: Diagnosis not present

## 2020-04-11 DIAGNOSIS — C187 Malignant neoplasm of sigmoid colon: Secondary | ICD-10-CM | POA: Diagnosis not present

## 2020-04-11 DIAGNOSIS — N390 Urinary tract infection, site not specified: Secondary | ICD-10-CM | POA: Diagnosis not present

## 2020-04-15 DIAGNOSIS — C187 Malignant neoplasm of sigmoid colon: Principal | ICD-10-CM

## 2020-04-15 DIAGNOSIS — Z1159 Encounter for screening for other viral diseases: Principal | ICD-10-CM

## 2020-04-16 ENCOUNTER — Ambulatory Visit: Admit: 2020-04-16 | Discharge: 2020-04-16 | Payer: MEDICARE

## 2020-04-16 ENCOUNTER — Encounter: Admit: 2020-04-16 | Discharge: 2020-04-16 | Payer: MEDICARE

## 2020-04-16 ENCOUNTER — Encounter: Admit: 2020-04-16 | Discharge: 2020-04-16 | Payer: MEDICARE | Attending: Internal Medicine | Primary: Internal Medicine

## 2020-04-16 DIAGNOSIS — Z1159 Encounter for screening for other viral diseases: Principal | ICD-10-CM

## 2020-04-16 DIAGNOSIS — C187 Malignant neoplasm of sigmoid colon: Principal | ICD-10-CM

## 2020-04-16 DIAGNOSIS — G309 Alzheimer's disease, unspecified: Secondary | ICD-10-CM | POA: Diagnosis not present

## 2020-04-16 DIAGNOSIS — Z9049 Acquired absence of other specified parts of digestive tract: Secondary | ICD-10-CM | POA: Diagnosis not present

## 2020-04-16 DIAGNOSIS — I69322 Dysarthria following cerebral infarction: Secondary | ICD-10-CM | POA: Diagnosis not present

## 2020-04-16 DIAGNOSIS — I10 Essential (primary) hypertension: Secondary | ICD-10-CM | POA: Diagnosis not present

## 2020-04-16 LAB — COMPREHENSIVE METABOLIC PANEL
ALBUMIN: 3.5 g/dL (ref 3.4–5.0)
ALKALINE PHOSPHATASE: 102 U/L (ref 46–116)
ALT (SGPT): 10 U/L (ref 10–49)
ANION GAP: 8 mmol/L (ref 5–14)
AST (SGOT): 15 U/L (ref <=34)
BILIRUBIN TOTAL: 0.3 mg/dL (ref 0.3–1.2)
BLOOD UREA NITROGEN: 14 mg/dL (ref 9–23)
BUN / CREAT RATIO: 17
CALCIUM: 9.4 mg/dL (ref 8.7–10.4)
CHLORIDE: 106 mmol/L (ref 98–107)
CO2: 25.5 mmol/L (ref 20.0–31.0)
CREATININE: 0.82 mg/dL
EGFR CKD-EPI AA MALE: >90 mL/min/{1.73_m2} (ref >=60)
EGFR CKD-EPI NON-AA MALE: 89 mL/min/{1.73_m2} (ref >=60)
GLUCOSE RANDOM: 132 mg/dL (ref 70–179)
POTASSIUM: 4 mmol/L (ref 3.4–4.8)
PROTEIN TOTAL: 7.1 g/dL (ref 5.7–8.2)
SODIUM: 139 mmol/L (ref 135–145)

## 2020-04-16 LAB — CBC W/ AUTO DIFF
BASOPHILS ABSOLUTE COUNT: 0.1 10*9/L (ref 0.0–0.1)
BASOPHILS RELATIVE PERCENT: 1.3 %
EOSINOPHILS ABSOLUTE COUNT: 0.2 10*9/L (ref 0.0–0.5)
EOSINOPHILS RELATIVE PERCENT: 2.9 %
HEMATOCRIT: 39.6 % (ref 39.0–48.0)
HEMOGLOBIN: 13 g/dL (ref 12.9–16.5)
LYMPHOCYTES ABSOLUTE COUNT: 1.6 10*9/L (ref 1.1–3.6)
LYMPHOCYTES RELATIVE PERCENT: 23.9 %
MEAN CORPUSCULAR HEMOGLOBIN CONC: 32.9 g/dL (ref 32.0–36.0)
MEAN CORPUSCULAR HEMOGLOBIN: 27.6 pg (ref 25.9–32.4)
MEAN CORPUSCULAR VOLUME: 84 fL (ref 77.6–95.7)
MEAN PLATELET VOLUME: 7.3 fL (ref 6.8–10.7)
MONOCYTES ABSOLUTE COUNT: 0.6 10*9/L (ref 0.3–0.8)
MONOCYTES RELATIVE PERCENT: 8.6 %
NEUTROPHILS ABSOLUTE COUNT: 4.2 10*9/L (ref 1.8–7.8)
NEUTROPHILS RELATIVE PERCENT: 63.3 %
NUCLEATED RED BLOOD CELLS: 0 /100{WBCs} (ref <=4)
PLATELET COUNT: 254 10*9/L (ref 150–450)
RED BLOOD CELL COUNT: 4.71 10*12/L (ref 4.26–5.60)
RED CELL DISTRIBUTION WIDTH: 14.7 % (ref 12.2–15.2)
WBC ADJUSTED: 6.6 10*9/L (ref 3.6–11.2)

## 2020-04-16 LAB — CEA: CARCINOEMBRYONIC ANTIGEN: 1.2 ng/mL (ref 0.0–5.0)

## 2020-04-16 LAB — MAGNESIUM: MAGNESIUM: 2 mg/dL (ref 1.6–2.6)

## 2020-04-16 MED ORDER — MIRTAZAPINE 15 MG TABLET
ORAL_TABLET | Freq: Every evening | ORAL | 0 refills | 45 days | Status: CP
Start: 2020-04-16 — End: ?

## 2020-04-16 NOTE — Unmapped (Signed)
[facility] MEDICAL ONCOLOG[facility]     REFERRING PROVIDER:   Gibson Ramp, Md  [address]  [place],  [place] [address]    PRIMARY CARE PROVIDER:  Lesly[facility]ERN  [address]  [place] [address]    CONSULTING PROVIDERS    Danny Dubs, MD, [facility] gastrointestinal surgery   ____________________________________________________________________    CANCER HISTORY  [id number] Stage IIIC MSS sigmoid colon adenocarcinoma, diagnosed 01/25/20 (s/p laparoscopic sigmoidectomy)    CANCER TREATMENT  Adjuvant 5-FU/LV  03-19-20 C1 (stopped after pump malfunction 2-10am)  04-16-20  C1 capecitabine alone  ____________________________________________________________________    ASSESSMENT  1. Stage IIIC sigmoid colon adenocarcinoma s/p laparoscopic sigmoidectomy 01/25/20  2. Pre-operative complete colonic obstruction  3. ECOG 3 postop  4. History of CVA with subsequent dysathric speech (remote) and BLE weakness  5. Underlying Alzheimer's dementia    RECOMMENDATIONS  1. Proceed with C1 capecitabine today  2. Colonoscopy within 3 months as none preoperatively  3. Home health referral  4. PT/OT referral   5. Increase remeron to 30mg      DISCUSSION  Plan to start capecitabine (delayed initiation due to 5-FU pump malfunctioning and subsequent issues with R arm numbness). RTC in 3 weeks for MD visit and consideration of C2 capeox. Calendar (chemo) given today. Continue KCl supplementation.    He will benefit from ongoing PT and DME recs per PT; he improved while he was receiving PT until 2 weeks back. I am concerned that [facility] is not a good historian due to underlying dementia, and it will be important to hear how he is doing from his daughter and son-in-law in addition. Low threshold to discontinue chemotherapy.          ______________________________________________________________________  HISTORY     [facility] is seen today at [facility] for consultation regarding Stage IIIC sigmoid colon adenocarcinoma  at the request of Dr. Milbert Hunt.      [facility] presented to [facility] with complaints of abdominal pain and distention due to constipation. CT abdomen/pelvis was concerning for primary colonic malignancy with functional closed-loop large bowel obstruction.??He was transferred to [facility] in [place], [place] under the care of the colorectal surgery team for further management. Due to his significant electrolyte abnormalities, he was placed on remote telemetry.  ??  [facility] gastro[facility]ology service was consulted on admission for sigmoidoscopy and stent placement, which was completed on 01/20/2019. His flexible sigmoidoscopy was significant for complete colonic obstruction. The patient tolerated the procedure well but was noted to have AMS afterwards requiring a telesitter. He underwent laparoscopic sigmoidectomy on 01/25/2020. He tolerated the procedure well. He was discharged on 02/01/2020.     Of note, he has a prior colonoscopy showing colon cancer more than 5 years ago at a different [facility]; he received no therapy and was told this resolved without intervention.    INTERVAL HISTORY:  S/p 5-FU pump malfunction 3-9 (pump stopped working within 12 hours).  He then presented to his local ED one week later with R arm weakness/numbness; he was diagnosed with pneumonia but did not receive any treatment post ED visit (abx, etc).  His daughter reports a CT scan of the head was done.    Today, [name] is accompanied by his daughter. His R arm weakness has resolved. He and his daughter feel he is at his baseline, and actually he is stronger than prior visits (not in a wheelchair). Denies fevers,  chills, CP, SOB, cough, abdominal pain, nausea, emesis, diarrhea, constipation, melena, hematochezia.  He can perform ADLs including toileting without assistance, but requires help with meal preparation.        MEDICAL HISTORY  1. Hypertension  2. prior CVA 2019, residual effect with dysarthric speech   3. Alzheimer's dementia   Past Medical History:   Diagnosis Date   ??? Cancer (CMS-HCC)    ??? Cognitive impairment    ??? Dementia (CMS-HCC)    ??? GERD (gastroesophageal reflux disease)    ??? History of stroke 09/22/2019   ??? History of tobacco abuse 09/22/2019   ??? Hypertension 09/22/2019   ??? Hypokalemia 09/22/2019   ??? Peptic ulcer    ??? Pneumonia due to COVID-19 virus 09/22/2019     Past Surgical History:   Procedure Laterality Date   ??? COLECTOMY     ??? IR INSERT PORT AGE GREATER THAN 5 YRS  03/07/2020    IR INSERT PORT AGE GREATER THAN 5 YRS 03/07/2020 Danny Frame, NP IMG [facility]   ??? LAPAROSCOPIC CHOLECYSTECTOMY     ??? PR COLONOSCOPY FLX WITH ENDOSCOPIC STENT PLACEMENT N/A 01/20/2020    Procedure: SIGMOIDOSCOPY, FLEXIBLE; WITH ENDOSCOPIC STENT PLACEMENT (INCLUDES PRE- AND POST-DILATION AND GUIDE WIRE PASSAGE, WHEN PERFORMED);  Surgeon: Danny Antigua, MD;  Location: MAIN OR [facility];  Service: Gastroenterology   ??? PR LAP,SURG,COLECTOMY, PARTIAL, W/ANAST N/A 01/25/2020    Procedure: LAPAROSCOPY, SURGICAL; COLECTOMY, PARTIAL WITH ANASTOMOSIS;  Surgeon: Danny Lull, MD;  Location: MAIN OR [place];  Service: Gastrointestinal         No Known Allergies  Medications reviewed in the EMR  Current Outpatient Medications on File Prior to Visit   Medication Sig Dispense Refill   ??? amLODIPine (NORVASC) 2.5 MG tablet Take 2 tablets (5 mg total) by mouth daily. 60 tablet 3   ??? diphenhydrAMINE-acetaminophen (TYLENOL PM) 25-500 mg Tab Take 2 tablets by mouth.     ??? docusate sodium (COLACE) 100 MG capsule Take 100 mg by mouth.      ??? famotidine (PEPCID) 20 MG tablet Take 20 mg by mouth.     ??? heparin, porcine, PF, 100 unit/mL Syrg Infuse 5 mL into a venous catheter every fourteen (14) days. For use while patient is on fluorouracil (5-FU) home infusion.   Flush IV catheter with heparin 5 mL as directed after final saline flush per SASH method and as needed for line maintenance. 4 each PRN   ??? HYDROcodone-acetaminophen (NORCO) 5-325 mg per tablet Take 1 tablet by mouth every six (6) hours as needed for pain. 30 tablet 0   ??? melatonin 10 mg TbMP Take 1 tablet by mouth every evening.     ??? memantine (NAMENDA) 10 MG tablet Take 10 mg by mouth Two (2) times a day.     ??? mirtazapine (REMERON) 15 MG tablet Take 15 mg by mouth.     ??? potassium chloride (MICRO-K) 10 mEq CR capsule Take 1 capsule (10 mEq total) by mouth Two (2) times a day. 60 capsule 0   ??? sodium chloride (NS) 0.9 % injection Infuse 10 mL into a venous catheter every fourteen (14) days. For use while patient is on fluorouracil (5-FU) home infusion.   Flush IV catheter with normal saline 10 mL prior to and after infusion followed by heparin flush per SASH method and as needed for line maintenance. 6 each PRN   ??? traZODone (DESYREL) 50 MG tablet Take 75 mg by mouth.     ???  capecitabine (XELODA) 150 MG tablet Take 2 tablets (300 mg total) by mouth Two (2) times a day  with 1 other capecitabine prescription for 1,800 mg total. Take Days 1 through 14, then 1 week off. (Patient not taking: Reported on 04/16/2020) 56 tablet 6   ??? capecitabine (XELODA) 500 MG tablet Take 3 tablets (1,500 mg total) by mouth Two (2) times a day  with 1 other capecitabine prescription for 1,800 mg total. Take Days 1 through 14, then 1 week off. (Patient not taking: Reported on 04/16/2020) 84 tablet 6   ??? cetirizine (ZYRTEC) 10 MG tablet Take 10 mg by mouth. (Patient not taking: Reported on 04/16/2020)     ??? loperamide (IMODIUM) 2 mg capsule Take 2 capsules to start, then 1 capsule every 2 hours until diarrhea free for 12 hours. (Patient not taking: Reported on 04/16/2020) 60 capsule 11   ??? ondansetron (ZOFRAN) 8 MG tablet Take 1 tablet (8 mg total) by mouth every eight (8) hours as needed for nausea. (Patient not taking: Reported on 04/16/2020) 30 tablet 2   ??? oxyCODONE (ROXICODONE) 5 MG immediate release tablet Take 1/2 tablet (2.5mg ) by mouth every 4-6 hours as needed for pain (Patient not taking: Reported on 04/16/2020) 10 tablet 0   ??? prochlorperazine (COMPAZINE) 10 MG tablet Take 1 tablet (10 mg total) by mouth every six (6) hours as needed (nausea). (Patient not taking: Reported on 04/16/2020) 30 tablet 2     No current facility-administered medications on file prior to visit.         FAMILY HISTORY    No known family history of cancer. He has 4 children all healthy.     SOCIAL HISTORY    Presents with his daughter Babette Relic. She lives with him. 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 No prior tobacco history or recent alcohol use. No other illicit drug use. He enjoys spending time in the yard. He spends most of his day watching TV. His wife passed October 03, 2019 from COVID.     REVIEW OF SYSTEMS    The remainder of a comprehensive 10 systems review is negative.    PHYSICAL EXAM  BP 137/90  - Pulse 80  - Temp 37.1 ??C (98.8 ??F) (Temporal)  - Resp 16  - Wt 91.2 kg (201 lb 1.6 oz)  - SpO2 100%  - BMI 28.85 kg/m??    Wt Readings from Last 3 Encounters:   04/16/20 91.2 kg (201 lb 1.6 oz)   03/29/20 88.7 kg (195 lb 8 oz)   03/19/20 90.4 kg (199 lb 4.7 oz)       GENERAL:frail (no walker), dysathric speech, AO x 3  PSYCH: full and appropriate range of affect with good insight and judgement  HEENT: NCAT, pupils equal, sclerae anicteric, OP exam deferred with mask in place,  neck supple without thyromegaly or masses;   LYMPH: no cervical, supraclavicular, periumbilical adenopathy  PULM: normal resp rate, clear bilaterally without wheezes, rhonchi, rales  COR: regular without murmur, rub, gallop  GI: abdomen soft, nontender, nondistended, no palpable hepatosplenomegaly.   EXT: warm and well perfused, no edema  SKIN: no rash or ulcer  NEURO: dysarthric speech    OBJECTIVE DATA  Lab Results   Component Value Date    WBC 6.6 04/16/2020    HGB 13.0 04/16/2020    HCT 39.6 04/16/2020    PLT 254 04/16/2020       Lab Results   Component Value Date  NA 139 04/16/2020    K 4.0 04/16/2020    CL 106 04/16/2020    CO2 25.5 04/16/2020    BUN 14 04/16/2020    CREATININE 0.82 04/16/2020    GLU 132 04/16/2020    CALCIUM 9.4 04/16/2020    MG 2.0 04/16/2020    PHOS 3.2 02/01/2020       Lab Results   Component Value Date    BILITOT 0.3 04/16/2020    PROT 7.1 04/16/2020    ALBUMIN 3.5 04/16/2020    ALT 10 04/16/2020    AST 15 04/16/2020    ALKPHOS 102 04/16/2020       Lab Results   Component Value Date    PT 10.4 03/29/2020    INR 1.02 03/29/2020    APTT 23.0 03/29/2020       01/20/20 Flexible Sigmoidoscopy:  Findings:       A malignant-appearing, intrinsic severe stenosis measuring 2 mm (inner        diameter) was found in the proximal sigmoid colon and was non-traversed.        Biopsies were taken with a cold forceps for histology. Placement of a        0.025 inch x 450 cm angled Visiglide wire was attempted. This passed        successfully. This was stented with a 25 mm x 9 cm WallFlex stent under        fluoroscopic guidance. Stool flowed through the stent. The patient's        abdomen was soft but distended at the end of the procedure.                                                                                   Impression:            - Complete colonic obstruction due to malignancy.                          Biopsied. Prosthesis placed.    PATHOLOGY:  01/25/20:  Final Diagnosis   A: Sigmoid colon, sigmoidectomy  - Moderately differentiated invasive adenocarcinoma invading through the muscularis propria into pericolonic tissue (pT3)  - Lymphovascular invasion extensively present  - 9 of 21 lymph nodes positive for metastatic carcinoma (pN2b, including part A and part B of the specimen)  - All surgical margins free of carcinoma involvement  - Please see synoptic report  ??  B: Colon, proximal sigmoid, colectomy  - Segment of colonic mucosa with no evidence of carcinoma involvement  ??  This electronic signature is attestation that the pathologist personally reviewed the submitted material(s) and the final diagnosis reflects that evaluation.   Electronically signed by Karene Fry, MD on 01/30/2020 at 1704   Synoptic Report     COLON AND RECTUM: Resection, Including Transanal Disk Excision of Rectal Neoplasms  8th Edition - Protocol posted: 03/08/2018  COLON AND RECTUM: RESECTION - A  SPECIMEN   Procedure  Sigmoidectomy    TUMOR   Tumor Site  Sigmoid colon    Histologic Type  Adenocarcinoma    Histologic Grade  G2: Moderately differentiated  Tumor Size  Greatest dimension (Centimeters): 8.5 cm   Additional Dimension (Centimeters)  6 cm   Tumor Extension  Tumor invades through the muscularis propria into pericolorectal tissue    Macroscopic Tumor Perforation  Not identified    Lymphovascular Invasion  Present    Lymphovascular Invasion Type  Small vessel lymphovascular invasion      Large vessel (venous) invasion      Intramural      Extramural    Perineural Invasion  Not identified    Tumor Budding  Number of tumor buds in one ???hotspot??? field (total number in area = 0.785 mm2): 2      Low score (0-4)    Type of Polyp in Which Invasive Carcinoma Arose  Sessile serrated adenoma / sessile serrated polyp    Treatment Effect  No known presurgical therapy    MARGINS   Margins  All margins are uninvolved by invasive carcinoma, high grade dysplasia / intramucosal carcinoma, and low grade dysplasia    Margins Examined  Proximal      Distal      Radial (circumferential) or Mesenteric    Closest Margin  Proximal    LYMPH NODES   Number of Lymph Nodes Involved  9    Number of Lymph Nodes Examined  21    Tumor Deposits  Not identified    PATHOLOGIC STAGE CLASSIFICATION (pTNM, AJCC 8th Edition)      Primary Tumor (pT)  pT3    Regional Lymph Nodes (pN)  pN2b           RADIOLOGY RESULTS (scans are personally reviewed by me)  01/21/20 CT chest:  Subcentimeter pulmonary nodule, indeterminate. Attention on follow-up.  Otherwise no evidence of thoracic metastatic disease.    01/19/20 CT abdomen pelvis:  -moderately distended colon with focal transition at the proximal sigmoid colon with hyperenhancing mural thickening concerning for apple-core type lesion with adjacent stranding and adenopathy in the mesenteric fat  -mild mural thickening of the more proximal colonic segments with pericolonic inflammation  -omental fat stranding in LLQ  -two small hypoattenuating hepatic lesions   -mild emphysema, bilateral scattered calcified granulomas  -subcentimeter LUL pulmonary nodule

## 2020-04-16 NOTE — Unmapped (Signed)
Clinical Pharmacist Note:      Reason for Visit:    [name] was seen at the request of Dr. Tye Maryland to assist with capecitabine start. Discussed logistics with daughter.  We previously discussed possible side effects.      BP 137/90  - Pulse 80  - Temp 37.1 ??C (98.8 ??F) (Temporal)  - Resp 16  - Wt 91.2 kg (201 lb 1.6 oz)  - SpO2 100%  - BMI 28.85 kg/m??     DISCUSSION     ??? Patient has received capecitabine in the mail and daughter is ready to help start therapy.    ??? I provided the calendar below that shows capecitabine dosing for Month of April.  Capecitabine is given twice daily for 14 days in a row, followed by 7 days off.    ??? We discussed timing of capecitabine.  Plan is for patient to complete 1 cycle of capecitabine and then add on oxaliplatin for the next cycle.      I spent approximately 15 minutes with the patient in direct care/counseling.  Patient had no further questions at this time.      Elaina Pattee, PharmD, BCOP, CPP      ? March April 2022 May ?   Wynelle Link EAV Tue Wed Thu Fri Sat        1    2      3    4    5    6     **START CAPECITABINE**    Capecitabine 150 mg Take 2 tablets (300 mg) once in the evening.      Capecitabine 500 mg   Take 3 tablets (1500 mg) once in the evening 7   Capecitabine 150 mg Take 2 tablets (300 mg) two times a day..      Capecitabine 500 mg   Take 3 tablets (1500 mg) two times a day.    (1800 mg total dose twice daily) 8   Capecitabine 150 mg Take 2 tablets (300 mg) two times a day..      Capecitabine 500 mg   Take 3 tablets (1500 mg) two times a day.    (1800 mg total dose twice daily) 9   Capecitabine 150 mg Take 2 tablets (300 mg) two times a day..      Capecitabine 500 mg   Take 3 tablets (1500 mg) two times a day.    (1800 mg total dose twice daily)   10   Capecitabine 150 mg Take 2 tablets (300 mg) two times a day..      Capecitabine 500 mg   Take 3 tablets (1500 mg) two times a day.    (1800 mg total dose twice daily) 11   Capecitabine 150 mg Take 2 tablets (300 mg) two times a day..      Capecitabine 500 mg   Take 3 tablets (1500 mg) two times a day.    (1800 mg total dose twice daily) 12   Capecitabine 150 mg Take 2 tablets (300 mg) two times a day..      Capecitabine 500 mg   Take 3 tablets (1500 mg) two times a day.    (1800 mg total dose twice daily) 13   Capecitabine 150 mg Take 2 tablets (300 mg) two times a day..      Capecitabine 500 mg   Take 3 tablets (1500 mg) two times a day.    (1800 mg total dose twice daily) 14 Holy  Thursday  Capecitabine 150 mg Take 2 tablets (300 mg) two times a day..      Capecitabine 500 mg   Take 3 tablets (1500 mg) two times a day.    (1800 mg total dose twice daily) 15 Good Friday  Capecitabine 150 mg Take 2 tablets (300 mg) two times a day..      Capecitabine 500 mg   Take 3 tablets (1500 mg) two times a day.    (1800 mg total dose twice daily) 16   Capecitabine 150 mg Take 2 tablets (300 mg) two times a day..      Capecitabine 500 mg   Take 3 tablets (1500 mg) two times a day.    (1800 mg total dose twice daily)   17 Easter  Capecitabine 150 mg Take 2 tablets (300 mg) two times a day..      Capecitabine 500 mg   Take 3 tablets (1500 mg) two times a day.    (1800 mg total dose twice daily) 18 Tax Day (Taxes Due)  Capecitabine 150 mg Take 2 tablets (300 mg) two times a day..      Capecitabine 500 mg   Take 3 tablets (1500 mg) two times a day.    (1800 mg total dose twice daily) 19   Capecitabine 150 mg Take 2 tablets (300 mg) two times a day..      Capecitabine 500 mg   Take 3 tablets (1500 mg) two times a day.    (1800 mg total dose twice daily) 20   Capecitabine 150 mg Take 2 tablets (300 mg) once in the morning.      Capecitabine 500 mg   Take 3 tablets (1500 mg) once in the morning.    **Start 7 day break**   21   No Capecitabine  22 Earth Day  No Capecitabine 23   No Capecitabine   24   No Capecitabine 25   No Capecitabine 26   No Capecitabine 27   At [place]:  Oxaliplatin infusion    Capecitabine 150 mg Take 2 tablets (300 mg) once in the evening.      Capecitabine 500 mg   Take 3 tablets (1500 mg) once in the evening     28   Capecitabine 150 mg Take 2 tablets (300 mg) two times a day..      Capecitabine 500 mg   Take 3 tablets (1500 mg) two times a day.    (1800 mg total dose twice daily) 29 Arbor Day  Capecitabine 150 mg Take 2 tablets (300 mg) two times a day..      Capecitabine 500 mg   Take 3 tablets (1500 mg) two times a day.    (1800 mg total dose twice daily) 30   Capecitabine 150 mg Take 2 tablets (300 mg) two times a day..      Capecitabine 500 mg   Take 3 tablets (1500 mg) two times a day.    (1800 mg total dose twice daily)     Take dose twice daily by mouth in the morning and evening. Doses should be administered with water within 30 minutes after a meal. Swallow tablets whole. Avoid cutting or crushing tablets.

## 2020-04-16 NOTE — Unmapped (Signed)
Per Dr. Tye Maryland, referral for home PT/OT entered.    Patient is under the care of [facility] and was receiving physical therapy services with them, discharged from that service in February. Spoke to [place] at [employer]. NN to fax new PT/OT referral to [facility] at:   Phone: [phone]  Fax: [phone]

## 2020-04-16 NOTE — Unmapped (Signed)
Patient seen today with daughter post MD visit. He is not receiving infusion today as treatment being switched to capecitabine. Weight slightly up since last nutrition visit by about 2 pounds. Daughter reports patient continues to drink about 2-3 Ensure shakes per day. She has not yet had a chance to go to the main hospital to pick up the Ensure shakes through the supplement program. Discussed that the [facility] location now has access to the Ensure through the supplement program and 3 cases of Ensure Plus provided to patient today since he is drinking up to 3 per day and will not return for another 3 weeks. Each Ensure Plus will provide ~350 calories and ~13gm protein.     Recommend continuing with Ensure Plus to supplement oral intake. Will continue to remain available as needed, as desired by patient.  Reviewed how to obtain more Ensure Plus in the future.    Time spent with patient <8 minutes.    Melynda Keller MS, RD, CSO, LDN  Outpatient Nutrition Services, [facility]  p [phone]  [email or web address]

## 2020-04-17 DIAGNOSIS — Z87891 Personal history of nicotine dependence: Secondary | ICD-10-CM | POA: Diagnosis not present

## 2020-04-17 DIAGNOSIS — Z8673 Personal history of transient ischemic attack (TIA), and cerebral infarction without residual deficits: Secondary | ICD-10-CM | POA: Diagnosis not present

## 2020-04-17 DIAGNOSIS — Z7901 Long term (current) use of anticoagulants: Secondary | ICD-10-CM | POA: Diagnosis not present

## 2020-04-17 DIAGNOSIS — E871 Hypo-osmolality and hyponatremia: Secondary | ICD-10-CM | POA: Diagnosis not present

## 2020-04-17 DIAGNOSIS — I10 Essential (primary) hypertension: Secondary | ICD-10-CM | POA: Diagnosis not present

## 2020-04-17 DIAGNOSIS — Z8616 Personal history of COVID-19: Secondary | ICD-10-CM | POA: Diagnosis not present

## 2020-04-17 DIAGNOSIS — Z9049 Acquired absence of other specified parts of digestive tract: Secondary | ICD-10-CM | POA: Diagnosis not present

## 2020-04-17 DIAGNOSIS — C187 Malignant neoplasm of sigmoid colon: Secondary | ICD-10-CM | POA: Diagnosis not present

## 2020-04-17 DIAGNOSIS — C779 Secondary and unspecified malignant neoplasm of lymph node, unspecified: Secondary | ICD-10-CM | POA: Diagnosis not present

## 2020-04-17 DIAGNOSIS — K219 Gastro-esophageal reflux disease without esophagitis: Secondary | ICD-10-CM | POA: Diagnosis not present

## 2020-04-17 DIAGNOSIS — N390 Urinary tract infection, site not specified: Secondary | ICD-10-CM | POA: Diagnosis not present

## 2020-04-22 DIAGNOSIS — Z9049 Acquired absence of other specified parts of digestive tract: Secondary | ICD-10-CM | POA: Diagnosis not present

## 2020-04-22 DIAGNOSIS — K219 Gastro-esophageal reflux disease without esophagitis: Secondary | ICD-10-CM | POA: Diagnosis not present

## 2020-04-22 DIAGNOSIS — N390 Urinary tract infection, site not specified: Secondary | ICD-10-CM | POA: Diagnosis not present

## 2020-04-22 DIAGNOSIS — E871 Hypo-osmolality and hyponatremia: Secondary | ICD-10-CM | POA: Diagnosis not present

## 2020-04-22 DIAGNOSIS — C779 Secondary and unspecified malignant neoplasm of lymph node, unspecified: Secondary | ICD-10-CM | POA: Diagnosis not present

## 2020-04-22 DIAGNOSIS — Z87891 Personal history of nicotine dependence: Secondary | ICD-10-CM | POA: Diagnosis not present

## 2020-04-22 DIAGNOSIS — I10 Essential (primary) hypertension: Secondary | ICD-10-CM | POA: Diagnosis not present

## 2020-04-22 DIAGNOSIS — Z8616 Personal history of COVID-19: Secondary | ICD-10-CM | POA: Diagnosis not present

## 2020-04-22 DIAGNOSIS — Z8673 Personal history of transient ischemic attack (TIA), and cerebral infarction without residual deficits: Secondary | ICD-10-CM | POA: Diagnosis not present

## 2020-04-22 DIAGNOSIS — C187 Malignant neoplasm of sigmoid colon: Secondary | ICD-10-CM | POA: Diagnosis not present

## 2020-04-22 DIAGNOSIS — Z7901 Long term (current) use of anticoagulants: Secondary | ICD-10-CM | POA: Diagnosis not present

## 2020-04-23 NOTE — Unmapped (Signed)
Received call from [facility] who went out to home to evaluate and offer physical therapy services. Daughter and patient told them that they do not feel that he needs services at this time but will call them if that should change. Representative was calling to let oncology team know.

## 2020-04-24 NOTE — Unmapped (Signed)
[facility] Clinical Assessment & Refill Coordination Note    Danny Hunt, DOB: [birth year]  Phone: [phone] (home)     All above HIPAA information was verified with patient's family member, Babette Relic - daughter.     Was a Nurse, learning disability used for this call? No    Specialty Medication(s):   Hematology/Oncology: Capecitabine 500mg , directions: Take 3 tablets (1,500 mg total) by mouth Two (2) times a day  with 1 other capecitabine prescription for 1,800 mg total. Take Days 1 through 14, then 1 week off. and Capecitabine 150mg , directions: Take 2 tablets (300 mg total) by mouth Two (2) times a day  with 1 other capecitabine prescription for 1,800 mg total. Take Days 1 through 14, then 1 week off.     Current Outpatient Medications   Medication Sig Dispense Refill   ??? amLODIPine (NORVASC) 2.5 MG tablet Take 2 tablets (5 mg total) by mouth daily. 60 tablet 3   ??? capecitabine (XELODA) 150 MG tablet Take 2 tablets (300 mg total) by mouth Two (2) times a day  with 1 other capecitabine prescription for 1,800 mg total. Take Days 1 through 14, then 1 week off. (Patient not taking: Reported on 04/16/2020) 56 tablet 6   ??? capecitabine (XELODA) 500 MG tablet Take 3 tablets (1,500 mg total) by mouth Two (2) times a day  with 1 other capecitabine prescription for 1,800 mg total. Take Days 1 through 14, then 1 week off. (Patient not taking: Reported on 04/16/2020) 84 tablet 6   ??? cetirizine (ZYRTEC) 10 MG tablet Take 10 mg by mouth. (Patient not taking: Reported on 04/16/2020)     ??? diphenhydrAMINE-acetaminophen (TYLENOL PM) 25-500 mg Tab Take 2 tablets by mouth.     ??? docusate sodium (COLACE) 100 MG capsule Take 100 mg by mouth.      ??? famotidine (PEPCID) 20 MG tablet Take 20 mg by mouth.     ??? heparin, porcine, PF, 100 unit/mL Syrg Infuse 5 mL into a venous catheter every fourteen (14) days. For use while patient is on fluorouracil (5-FU) home infusion.   Flush IV catheter with heparin 5 mL as directed after final saline flush per SASH method and as needed for line maintenance. 4 each PRN   ??? HYDROcodone-acetaminophen (NORCO) 5-325 mg per tablet Take 1 tablet by mouth every six (6) hours as needed for pain. 30 tablet 0   ??? loperamide (IMODIUM) 2 mg capsule Take 2 capsules to start, then 1 capsule every 2 hours until diarrhea free for 12 hours. (Patient not taking: Reported on 04/16/2020) 60 capsule 11   ??? melatonin 10 mg TbMP Take 1 tablet by mouth every evening.     ??? memantine (NAMENDA) 10 MG tablet Take 10 mg by mouth Two (2) times a day.     ??? mirtazapine (REMERON) 15 MG tablet Take 2 tablets (30 mg total) by mouth nightly. 90 tablet 0   ??? ondansetron (ZOFRAN) 8 MG tablet Take 1 tablet (8 mg total) by mouth every eight (8) hours as needed for nausea. (Patient not taking: Reported on 04/16/2020) 30 tablet 2   ??? oxyCODONE (ROXICODONE) 5 MG immediate release tablet Take 1/2 tablet (2.5mg ) by mouth every 4-6 hours as needed for pain (Patient not taking: Reported on 04/16/2020) 10 tablet 0   ??? prochlorperazine (COMPAZINE) 10 MG tablet Take 1 tablet (10 mg total) by mouth every six (6) hours as needed (nausea). (Patient not taking: Reported on 04/16/2020) 30 tablet  2   ??? sodium chloride (NS) 0.9 % injection Infuse 10 mL into a venous catheter every fourteen (14) days. For use while patient is on fluorouracil (5-FU) home infusion.   Flush IV catheter with normal saline 10 mL prior to and after infusion followed by heparin flush per SASH method and as needed for line maintenance. 6 each PRN   ??? traZODone (DESYREL) 50 MG tablet Take 75 mg by mouth.       No current facility-administered medications for this visit.        Changes to medications: mirtazapine dose increased from 15 mg to 30 mg nightly    No Known Allergies    Changes to allergies: No    SPECIALTY MEDICATION ADHERENCE     cape 500 mg: 6 days of medicine on hand   Cape 150 mg: 6 days of medicine on hand     Medication Adherence    Patient reported X missed doses in the last month: 0  Specialty Medication: capecitabine 500 mg and 150 mg tab - 1800 mg bid x 14 off x 7  Patient is on additional specialty medications: No  Informant: child/children  Confirmed plan for next specialty medication refill: delivery by pharmacy          Specialty medication(s) dose(s) confirmed: Regimen is correct and unchanged.     Are there any concerns with adherence? No    Adherence counseling provided? Not needed    CLINICAL MANAGEMENT AND INTERVENTION      Clinical Benefit Assessment:    Do you feel the medicine is effective or helping your condition? Yes    Clinical Benefit counseling provided? Not needed    Adverse Effects Assessment:    Are you experiencing any side effects? No    Are you experiencing difficulty administering your medicine? No    Quality of Life Assessment:    How many days over the past month did your Malignant neoplasm of sigmoid colon  keep you from your normal activities? For example, brushing your teeth or getting up in the morning. 0    Have you discussed this with your provider? Not needed    Acute Infection Status:    Acute infections noted within Epic:  No active infections  Patient reported infection: None    Therapy Appropriateness:    Is therapy appropriate? Yes, therapy is appropriate and should be continued    DISEASE/MEDICATION-SPECIFIC INFORMATION      N/A    PATIENT SPECIFIC NEEDS     - Does the patient have any physical, cognitive, or cultural barriers? No    - Is the patient high risk? Yes, patient is taking oral chemotherapy. Appropriateness of therapy as been assessed    - Does the patient require a Care Management Plan? No     - Does the patient require physician intervention or other additional services (i.e. nutrition, smoking cessation, social work)? No      SHIPPING     Specialty Medication(s) to be Shipped:   Hematology/Oncology: Capecitabine 500mg , directions: Take 3 tablets (1,500 mg total) by mouth Two (2) times a day  with 1 other capecitabine prescription for 1,800 mg total. Take Days 1 through 14, then 1 week off. and Capecitabine 150mg , directions: Take 2 tablets (300 mg total) by mouth Two (2) times a day  with 1 other capecitabine prescription for 1,800 mg total. Take Days 1 through 14, then 1 week off.    Other medication(s) to be shipped: No additional medications  requested for fill at this time     Changes to insurance: No    Delivery Scheduled: Yes, Expected medication delivery date: 05/02/20.     Medication will be delivered via UPS to the confirmed prescription address in [facility].    The patient will receive a drug information handout for each medication shipped and additional FDA Medication Guides as required.  Verified that patient has previously received a Conservation officer, historic buildings and a Surveyor, mining.    All of the patient's questions and concerns have been addressed.    Breck Coons [facility] Specialty Pharmacist

## 2020-04-29 DIAGNOSIS — N390 Urinary tract infection, site not specified: Secondary | ICD-10-CM | POA: Diagnosis not present

## 2020-04-29 DIAGNOSIS — Z8616 Personal history of COVID-19: Secondary | ICD-10-CM | POA: Diagnosis not present

## 2020-04-29 DIAGNOSIS — I10 Essential (primary) hypertension: Secondary | ICD-10-CM | POA: Diagnosis not present

## 2020-04-29 DIAGNOSIS — C187 Malignant neoplasm of sigmoid colon: Secondary | ICD-10-CM | POA: Diagnosis not present

## 2020-04-29 DIAGNOSIS — C779 Secondary and unspecified malignant neoplasm of lymph node, unspecified: Secondary | ICD-10-CM | POA: Diagnosis not present

## 2020-04-29 DIAGNOSIS — Z9049 Acquired absence of other specified parts of digestive tract: Secondary | ICD-10-CM | POA: Diagnosis not present

## 2020-04-29 DIAGNOSIS — Z87891 Personal history of nicotine dependence: Secondary | ICD-10-CM | POA: Diagnosis not present

## 2020-04-29 DIAGNOSIS — K219 Gastro-esophageal reflux disease without esophagitis: Secondary | ICD-10-CM | POA: Diagnosis not present

## 2020-04-29 DIAGNOSIS — Z8673 Personal history of transient ischemic attack (TIA), and cerebral infarction without residual deficits: Secondary | ICD-10-CM | POA: Diagnosis not present

## 2020-04-29 DIAGNOSIS — Z7901 Long term (current) use of anticoagulants: Secondary | ICD-10-CM | POA: Diagnosis not present

## 2020-04-29 DIAGNOSIS — E871 Hypo-osmolality and hyponatremia: Secondary | ICD-10-CM | POA: Diagnosis not present

## 2020-05-01 MED FILL — CAPECITABINE 150 MG TABLET: ORAL | 21 days supply | Qty: 56 | Fill #1

## 2020-05-01 MED FILL — CAPECITABINE 500 MG TABLET: ORAL | 21 days supply | Qty: 84 | Fill #1

## 2020-05-02 ENCOUNTER — Other Ambulatory Visit: Payer: Self-pay | Admitting: Family Medicine

## 2020-05-02 DIAGNOSIS — C779 Secondary and unspecified malignant neoplasm of lymph node, unspecified: Secondary | ICD-10-CM | POA: Diagnosis not present

## 2020-05-02 DIAGNOSIS — Z7901 Long term (current) use of anticoagulants: Secondary | ICD-10-CM | POA: Diagnosis not present

## 2020-05-02 DIAGNOSIS — E871 Hypo-osmolality and hyponatremia: Secondary | ICD-10-CM | POA: Diagnosis not present

## 2020-05-02 DIAGNOSIS — K219 Gastro-esophageal reflux disease without esophagitis: Secondary | ICD-10-CM | POA: Diagnosis not present

## 2020-05-02 DIAGNOSIS — I10 Essential (primary) hypertension: Secondary | ICD-10-CM | POA: Diagnosis not present

## 2020-05-02 DIAGNOSIS — Z8616 Personal history of COVID-19: Secondary | ICD-10-CM | POA: Diagnosis not present

## 2020-05-02 DIAGNOSIS — Z9049 Acquired absence of other specified parts of digestive tract: Secondary | ICD-10-CM | POA: Diagnosis not present

## 2020-05-02 DIAGNOSIS — Z87891 Personal history of nicotine dependence: Secondary | ICD-10-CM | POA: Diagnosis not present

## 2020-05-02 DIAGNOSIS — C187 Malignant neoplasm of sigmoid colon: Secondary | ICD-10-CM | POA: Diagnosis not present

## 2020-05-02 DIAGNOSIS — N390 Urinary tract infection, site not specified: Secondary | ICD-10-CM | POA: Diagnosis not present

## 2020-05-02 DIAGNOSIS — Z8673 Personal history of transient ischemic attack (TIA), and cerebral infarction without residual deficits: Secondary | ICD-10-CM | POA: Diagnosis not present

## 2020-05-06 ENCOUNTER — Other Ambulatory Visit: Payer: Medicare Other

## 2020-05-06 ENCOUNTER — Telehealth: Payer: Self-pay | Admitting: *Deleted

## 2020-05-06 DIAGNOSIS — N390 Urinary tract infection, site not specified: Secondary | ICD-10-CM | POA: Diagnosis not present

## 2020-05-06 DIAGNOSIS — Z87891 Personal history of nicotine dependence: Secondary | ICD-10-CM | POA: Diagnosis not present

## 2020-05-06 DIAGNOSIS — I10 Essential (primary) hypertension: Secondary | ICD-10-CM | POA: Diagnosis not present

## 2020-05-06 DIAGNOSIS — N475 Adhesions of prepuce and glans penis: Secondary | ICD-10-CM

## 2020-05-06 DIAGNOSIS — Z8673 Personal history of transient ischemic attack (TIA), and cerebral infarction without residual deficits: Secondary | ICD-10-CM | POA: Diagnosis not present

## 2020-05-06 DIAGNOSIS — C187 Malignant neoplasm of sigmoid colon: Secondary | ICD-10-CM | POA: Diagnosis not present

## 2020-05-06 DIAGNOSIS — E871 Hypo-osmolality and hyponatremia: Secondary | ICD-10-CM | POA: Diagnosis not present

## 2020-05-06 DIAGNOSIS — C779 Secondary and unspecified malignant neoplasm of lymph node, unspecified: Secondary | ICD-10-CM | POA: Diagnosis not present

## 2020-05-06 DIAGNOSIS — Z9049 Acquired absence of other specified parts of digestive tract: Secondary | ICD-10-CM | POA: Diagnosis not present

## 2020-05-06 DIAGNOSIS — Z7901 Long term (current) use of anticoagulants: Secondary | ICD-10-CM | POA: Diagnosis not present

## 2020-05-06 DIAGNOSIS — Z8616 Personal history of COVID-19: Secondary | ICD-10-CM | POA: Diagnosis not present

## 2020-05-06 DIAGNOSIS — K219 Gastro-esophageal reflux disease without esophagitis: Secondary | ICD-10-CM | POA: Diagnosis not present

## 2020-05-06 NOTE — Addendum Note (Signed)
Addended by: Caryl Pina on: 05/06/2020 05:08 PM   Modules accepted: Orders

## 2020-05-06 NOTE — Telephone Encounter (Signed)
[name] at [facility] aware referral to [facility] has been placed.

## 2020-05-06 NOTE — Telephone Encounter (Signed)
Placed referral to [facility]

## 2020-05-06 NOTE — Telephone Encounter (Signed)
VM from Todd Berg w/ [facility] Pt was c/o pain & burning while urinating, urine sample was collected & dropped off at office Nurse accompanied pt to restroom during collection of urine. Pt is uncircumcised and noted that his foreskin is adhering over the top of his penis and only has a small opening for his urine stream Would like to know if a referral to [facility] can be place for pt to have this looked at

## 2020-05-08 DIAGNOSIS — I10 Essential (primary) hypertension: Secondary | ICD-10-CM | POA: Diagnosis not present

## 2020-05-08 DIAGNOSIS — C779 Secondary and unspecified malignant neoplasm of lymph node, unspecified: Secondary | ICD-10-CM | POA: Diagnosis not present

## 2020-05-08 DIAGNOSIS — Z8673 Personal history of transient ischemic attack (TIA), and cerebral infarction without residual deficits: Secondary | ICD-10-CM | POA: Diagnosis not present

## 2020-05-08 DIAGNOSIS — K219 Gastro-esophageal reflux disease without esophagitis: Secondary | ICD-10-CM | POA: Diagnosis not present

## 2020-05-08 DIAGNOSIS — E871 Hypo-osmolality and hyponatremia: Secondary | ICD-10-CM | POA: Diagnosis not present

## 2020-05-08 DIAGNOSIS — Z8616 Personal history of COVID-19: Secondary | ICD-10-CM | POA: Diagnosis not present

## 2020-05-08 DIAGNOSIS — Z7901 Long term (current) use of anticoagulants: Secondary | ICD-10-CM | POA: Diagnosis not present

## 2020-05-08 DIAGNOSIS — N390 Urinary tract infection, site not specified: Secondary | ICD-10-CM | POA: Diagnosis not present

## 2020-05-08 DIAGNOSIS — Z87891 Personal history of nicotine dependence: Secondary | ICD-10-CM | POA: Diagnosis not present

## 2020-05-08 DIAGNOSIS — C187 Malignant neoplasm of sigmoid colon: Secondary | ICD-10-CM | POA: Diagnosis not present

## 2020-05-08 DIAGNOSIS — Z9049 Acquired absence of other specified parts of digestive tract: Secondary | ICD-10-CM | POA: Diagnosis not present

## 2020-05-08 NOTE — Unmapped (Signed)
I spoke with pts daughter to confirm appts 4/27 were r/s to 5/4. Pts daughter confirmed date, time and lcoation of appt.

## 2020-05-11 DIAGNOSIS — C187 Malignant neoplasm of sigmoid colon: Secondary | ICD-10-CM | POA: Diagnosis not present

## 2020-05-12 ENCOUNTER — Encounter: Payer: Self-pay | Admitting: Adult Health

## 2020-05-12 ENCOUNTER — Ambulatory Visit (INDEPENDENT_AMBULATORY_CARE_PROVIDER_SITE_OTHER): Payer: Medicare Other | Admitting: Adult Health

## 2020-05-12 VITALS — BP 137/82 | HR 80 | Ht 69.0 in | Wt 202.0 lb

## 2020-05-12 DIAGNOSIS — E871 Hypo-osmolality and hyponatremia: Secondary | ICD-10-CM | POA: Diagnosis not present

## 2020-05-12 DIAGNOSIS — N390 Urinary tract infection, site not specified: Secondary | ICD-10-CM | POA: Diagnosis not present

## 2020-05-12 DIAGNOSIS — C187 Malignant neoplasm of sigmoid colon: Secondary | ICD-10-CM

## 2020-05-12 DIAGNOSIS — Z8673 Personal history of transient ischemic attack (TIA), and cerebral infarction without residual deficits: Secondary | ICD-10-CM | POA: Diagnosis not present

## 2020-05-12 DIAGNOSIS — Z87891 Personal history of nicotine dependence: Secondary | ICD-10-CM | POA: Diagnosis not present

## 2020-05-12 DIAGNOSIS — F0150 Vascular dementia without behavioral disturbance: Secondary | ICD-10-CM | POA: Diagnosis not present

## 2020-05-12 DIAGNOSIS — K219 Gastro-esophageal reflux disease without esophagitis: Secondary | ICD-10-CM | POA: Diagnosis not present

## 2020-05-12 DIAGNOSIS — Z7901 Long term (current) use of anticoagulants: Secondary | ICD-10-CM | POA: Diagnosis not present

## 2020-05-12 DIAGNOSIS — C779 Secondary and unspecified malignant neoplasm of lymph node, unspecified: Secondary | ICD-10-CM | POA: Diagnosis not present

## 2020-05-12 DIAGNOSIS — Z9049 Acquired absence of other specified parts of digestive tract: Secondary | ICD-10-CM | POA: Diagnosis not present

## 2020-05-12 DIAGNOSIS — Z8616 Personal history of COVID-19: Secondary | ICD-10-CM | POA: Diagnosis not present

## 2020-05-12 DIAGNOSIS — I10 Essential (primary) hypertension: Secondary | ICD-10-CM | POA: Diagnosis not present

## 2020-05-12 NOTE — Progress Notes (Signed)
[facility] [address] [place]. [place] [address] [phone]       OFFICE FOLLOW UP VISIT NOTE  Todd Berg Date of Birth:  [birth year] Medical Record Number:  [id number]   Referring MD: Vonna Kotyk Dettinger  Reason for Referral: Memory loss  Chief Complaint  Patient presents with  . Follow-up    RM 67 with daughter (tammy) Pt is stable from stroke standpoint, memory had declined per daughter      HPI:  Today, 05/12/2020, Todd Berg returns for follow-up after prior visit almost 4 months ago accompanied by his daughter  Stable from stroke standpoint without new stroke/TIA symptoms with residual cognitive impairment and dysarthria Per daughter, cognition has been relatively stable but poor short-term memory but long-term memory greatly intact. Remains on Namenda 10 mg twice daily without side effects.  Some difficulty with insomnia but also daughter reports poor sleep hygiene -continued use of trazodone and Remeron (Remeron also use for appetite stimulant). He continues to reside with his daughter, [name], who is his primary caregiver who assists with some ADLs and majority of IADLs although he does report trying to stay active during the day doing yard work and has mowed the lawn a of couple times with daughter supervision  Blood pressure today 137/82 -occasionally monitored at home which has been stable  Since prior visit, he has been started on chemo with additional infusion on Wednesday and routinely follows with [facility] oncology and nutrition Pain management evaluation 5/12 for multiple joint pain location   No further concerns at this time     History provided for reference purposes only Update 02/11/2020 JM: Todd Berg is being seen for sooner visit per family request due to worsening cognition. He has had multiple ED admissions since prior visit due to lower abdominal pain with concern of primary colonic malignancy with functional  closed-loop large bowel obstruction as evidenced on CT abdomen/pelvis and transferred from [name] ED to [facility] in [place], [place] where he underwent sigmoidoscopy and stent placement on 01/20/2020 and then laparoscopic sigmoidectomy on 01/25/2020 and eventually discharged home on 02/01/2020. Per review of [name] lab work, biopsy came back with moderately differentiated invasive adenocarcinoma, lymphovascular invasion extensively present and 9/12 lymph nodes positive for metastatic carcinoma.  He has been referred to oncology for further evaluation.  Daughter called office on 02/06/2020 requesting sooner visit due to worsening cognition since he returned back home from hospital.  He has had greater difficulty with insomnia, agitation, aggression and hallucinations.  He has been attempting to continue to drive despite family telling him not to.  Daughter is requesting assistance to revoke his license as he will likely not try to drive if he is told that his license is revoked.  He continues to forget about the passing of his wife and when family reminds him of her passing, he becomes very upset as he is reliving this loss over and over.  PCP initiated trazodone for insomnia prior to recent admission and has remained on 50 mg nightly with daughter reporting initial benefit but no benefit recently.  He will take occasional naps during the day but not necessarily sleeping throughout the entire day.  Limited physical activity and exercise especially since hospital discharge but currently working with [facility] PT. his daughter [name] is his primary caregiver and is with him 24/7.  He has remained on Namenda 10 mg twice daily tolerating without side effects.  MMSE today unable to be completed (prior 9/30  2 mo ago).  CTA head/neck and EEG ordered back in September not yet scheduled.  No further concerns at this time.  Update 12/10/2019 Dr. Leonie Man: He returns for follow-up after last visit 2 months ago.  Patient is  accompanied by his daughter [name].  Patient got Covid infection in September along with his wife who unfortunately later died of Covid complications.  Patient has been able to tolerate Namenda 10 mg twice daily but family feels that his still has significant cognitive impairment.  Requires 24-hour supervision.  He has been getting agitated more frequently.  He has trouble sleeping at night despite taking melatonin which he takes at bedtime.  Is also gets scared easily and sometimes paranoid.  He has had longstanding tremors in his hands.  There have been no unsafe behaviors, delusions or hallucinations.  Blood pressure usually well controlled today it is 146/84 in office.  He has not had any recurrent stroke or TIA symptoms.  He is unable to tolerate aspirin because of previous history of GI hemorrhage and hence is not taking it   Initial consultation 09/20/2019 Dr. Leonie Man: Todd Berg is a 70 year old Caucasian male seen today for office consultation visit for memory loss.  History is obtained from the patient and his daughter [name] was accompanying him as well as review of electronic medical records and I have reviewed personally available imaging films in PACS.  [name] is a past medical history of rectal cancer, chronic back pain, hypertension, left thalamic lacunar infarct in May 2019 with no remaining residual deficits.  He has had cognitive decline he feels ever since his stroke 2 years ago.  This has been progressive in recent months seems to have impaired his functionality at home.  He gets quite forgetful even after being told several times recent information.  He gets agitated easily.  He can still remember things from the past well but recent incident seem difficult.  He has had not had any violent behavior and denies any delusions, hallucinations or unsafe behavior.  He is eats and sleeps well.  He can walk independently without assistance.  He has had no recurrent symptoms of new strokes.  He does  have family history of dementia in his mother in her 25s.  Patient did have an outpatient MRI scan of the brain done on 08/27/2019 which I personally reviewed shows left thalamic infarcts of remote age and 2 lesions separated one of them was on a previous MRI from May 29, 2017 the other 1 appears to be in new but not recent.  There is diminished flow noted in the right vertebral artery.  Previously echocardiogram in May 2019 was unremarkable.  He has not had any recent dementia pending labs.  The family denies any history of witnessed seizure, head injury, loss of consciousness.  He denies history of depression.  He apparently had Mini-Mental status done at the primary care physician's office recently which also showed 9/30.    ROS:   N/A d/t cognitive impairment  PMH:  Past Medical History:  Diagnosis Date  . Cancer ([place]) 2015   rectal  . Chronic back pain   . COPD (chronic obstructive pulmonary disease) ([place])   . Dementia ([place])   . GERD (gastroesophageal reflux disease)   . Hematemesis   . Hemorrhoids   . Hyperlipidemia   . Hypotension   . Melena   . Melena 10/12/2017  . Stroke [facility]) 2019    Social History:  Social History   Socioeconomic History  .  Marital status: Married    Spouse name: [name]  . Number of children: 5  . Years of education: 8  . Highest education level: 8th grade  Occupational History  . Occupation: Retired  Tobacco Use  . Smoking status: Former Smoker    Start date: 12/30/1964    Quit date: 06/19/2009    Years since quitting: 10.9  . Smokeless tobacco: Former Systems developer    Types: Snuff    Quit date: 09/26/2005  Vaping Use  . Vaping Use: Never used  Substance and Sexual Activity  . Alcohol use: No  . Drug use: No  . Sexual activity: Not Currently  Other Topics Concern  . Not on file  Social History Narrative   Lives with daughter and son in law   Right Handed   Drinks 3-5 cups caffeine daily   Social Determinants of Health   Financial Resource  Strain: Low Risk   . Difficulty of Paying Living Expenses: Not hard at all  Food Insecurity: No Food Insecurity  . Worried About Charity fundraiser in the Last Year: Never true  . Ran Out of Food in the Last Year: Never true  Transportation Needs: No Transportation Needs  . Lack of Transportation (Medical): No  . Lack of Transportation (Non-Medical): No  Physical Activity: Inactive  . Days of Exercise per Week: 0 days  . Minutes of Exercise per Session: 0 min  Stress: No Stress Concern Present  . Feeling of Stress : Not at all  Social Connections: Socially Integrated  . Frequency of Communication with Friends and Family: More than three times a week  . Frequency of Social Gatherings with Friends and Family: More than three times a week  . Attends Religious Services: More than 4 times per year  . Active Member of Clubs or Organizations: Yes  . Attends Archivist Meetings: More than 4 times per year  . Marital Status: Married  Human resources officer Violence: Not At Risk  . Fear of Current or Ex-Partner: No  . Emotionally Abused: No  . Physically Abused: No  . Sexually Abused: No    Medications:   Current Outpatient Medications on File Prior to Visit  Medication Sig Dispense Refill  . albuterol (VENTOLIN HFA) 108 (90 Base) MCG/ACT inhaler Inhale 2 puffs into the lungs every 6 (six) hours as needed for wheezing or shortness of breath. 8 g 0  . amLODipine (NORVASC) 2.5 MG tablet Take 1 tablet (2.5 mg total) by mouth daily. (NEEDS TO BE SEEN BEFORE NEXT REFILL) 30 tablet 0  . cetirizine (ZYRTEC) 10 MG tablet Take 1 tablet (10 mg total) by mouth daily. For itching 90 tablet 0  . Cyanocobalamin (VITAMIN B-12) 5000 MCG SUBL DISSOLVE 1 TABLET UNDER TONGUE DAILY 110 tablet 0  . diphenhydramine-acetaminophen (TYLENOL PM) 25-500 MG TABS tablet Take 2 tablets by mouth at bedtime as needed (mild pain).    Marland Kitchen docusate sodium (COLACE) 100 MG capsule Take 1 capsule (100 mg total) by mouth daily  as needed for mild constipation. 30 capsule 0  . famotidine (PEPCID) 20 MG tablet Take 1 tablet (20 mg total) by mouth 2 (two) times daily. 60 tablet 1  . Melatonin 10 MG TBCR Take 1 tablet by mouth every evening.    . memantine (NAMENDA) 10 MG tablet Take 10 mg by mouth 2 (two) times daily.    . mirtazapine (REMERON) 15 MG tablet Take 1 tablet (15 mg total) by mouth at bedtime. 30 tablet 1  .  pantoprazole (PROTONIX) 40 MG tablet Take 1 tablet (40 mg total) by mouth 2 (two) times daily before a meal. 180 tablet 0  . potassium chloride (KLOR-CON) 10 MEQ tablet Take 1 tablet (10 mEq total) by mouth daily. 30 tablet 2  . psyllium (METAMUCIL) 58.6 % packet Take 1 packet by mouth daily.    . traZODone (DESYREL) 50 MG tablet Take 1.5 tablets (75 mg total) by mouth at bedtime as needed for sleep. 45 tablet 3   No current facility-administered medications on file prior to visit.    Allergies:  No Known Allergies  Physical Exam Today's Vitals   05/12/20 0805  BP: 137/82  Pulse: 80  Weight: 202 lb (91.6 kg)  Height: 5\' 9"  (1.753 m)   Body mass index is 29.83 kg/m.   General: well developed, well nourished very pleasant elderly Caucasian male, seated, in no evident distress Head: head normocephalic and atraumatic.   Neck: supple with no carotid or supraclavicular bruits Cardiovascular: regular rate and rhythm, no murmurs Musculoskeletal: no deformity Skin:  no rash/petichiae Vascular:  Normal pulses all extremities  Neurologic Exam Mental Status: Awake and fully alert. Recent memory poor and remote memory intact.  Disoriented to place, time and age but able to state name and address.  Attention span, concentration and fund of knowledge diminished with daughter providing history. Mood and affect appropriate and cooperative with exam Cranial Nerves:  pupils equal, briskly reactive to light. Extraocular movements full without nystagmus. Visual fields full to confrontation. Hearing intact.  Facial sensation intact. Face, tongue, palate moves normally and symmetrically.  Motor: Normal bulk and tone. Normal strength in all tested extremity muscles.  Mild upper extremity action tremor left greater than right stable.  No cogwheel rigidity or pill-rolling. Sensory.: intact to touch , pinprick , position and vibratory sensation.  Coordination: Rapid alternating movements normal in all extremities. Finger-to-nose and heel-to-shin performed accurately bilaterally. Gait and Station: Arises from chair without difficulty. Stance is slightly hunched. Gait demonstrates mild ataxia with broad-based gait with decreased step height and step length.  Ambulates without assistive device.  Tandem walk and heel toe not attempted due to safety concerns Reflexes: 1+ and symmetric. Toes downgoing.       ASSESSMENT/PLAN: 70 year old Caucasian male with progressive memory and cognitive deterioration over the last 2 years following a small lacunar infarct likely in setting of vascular dementia. History of left thalamic lacunar infarct in May 2019 and additional left thalamic infarct noted on subsequent MRI scan on 08/27/2019 clinically silent.  Diagnosed with stage III sigmoid colon adenocarcinoma s/p laparoscopic sigmoidectomy 01/25/2020 currently undergoing chemo     1. Vascular dementia without behaviors:  a. MMSE unable to be completed but subjectively stable b. Continue Namenda 10 mg twice daily as well as trazodone managed by PCP c. Discussed importance of daily activity and memory exercises but this may be difficult as he is currently undergoing chemo -advised importance of routine follow-up with oncology  2. Hx of prior strokes:  a. Discussed importance of secondary stroke prevention measures and close PCP f/u for aggressive stroke risk factor management including HTN with BP goal<130/90.  He declines interest in aspirin due to history of GI bleed.    3. Colon cancer: dx'd 01/2020 with biopsy positive  for moderately differentiated invasive adenocarcinoma and lymphovascular invasion extensively present s/p laparoscopic sigmoid colectomy 01/25/2020 and adjuvant chemotherapy with initial infusion 03/19/2020.  Follows routinely with oncologist Dr. Ricki Rodriguez of [facility] as well as nutrition currently on  Remeron for appetite stimulant   Follow-up in 6 months or call earlier if needed   CC:  [facility] provider: Dr. Leonie Man Dettinger, [name], MD    Frann Rider, [facility]  [facility] [address] [place],  [address]  Phone [phone] Fax [phone] Note: This document was prepared with digital dictation and possible smart phrase technology. Any transcriptional errors that result from this process are unintentional.

## 2020-05-12 NOTE — Patient Instructions (Signed)
No changes today  Please follow up in 6 months or call earlier if needed

## 2020-05-12 NOTE — Progress Notes (Signed)
I agree with the above plan 

## 2020-05-13 DIAGNOSIS — Z1159 Encounter for screening for other viral diseases: Principal | ICD-10-CM

## 2020-05-13 DIAGNOSIS — C187 Malignant neoplasm of sigmoid colon: Principal | ICD-10-CM

## 2020-05-13 NOTE — Unmapped (Signed)
[facility] GI MEDICAL ONCOLOGY     REFERRING PROVIDER:   Gibson Ramp, Md  [address]  [place],  [place] [address]    PRIMARY CARE PROVIDER:  Leslye Peer WESTERN  [address]  [place] [address]    CONSULTING PROVIDERS    Marcella Dubs, MD, [facility] gastrointestinal surgery   ____________________________________________________________________    CANCER HISTORY  [id number] Stage IIIC MSS sigmoid colon adenocarcinoma, diagnosed 01/25/20 (s/p laparoscopic sigmoidectomy)    CANCER TREATMENT  Adjuvant 5-FU/LV  03-19-20 C1 (stopped after pump malfunction 2-10am)  04-16-20  C1 capecitabine alone   05-14-20  C2 adding oxaliplatin (delayed 1 week due to fever)  ____________________________________________________________________    ASSESSMENT  1. Stage IIIC sigmoid colon adenocarcinoma s/p laparoscopic sigmoidectomy 01/25/20  2. Pre-operative complete colonic obstruction  3. ECOG 3 postop  4. History of CVA with subsequent dysathric speech (remote) and BLE weakness  5. Underlying Alzheimer's dementia  6. Need for COVID booster    RECOMMENDATIONS   1. Proceed with C2 capecitabine, adding oxaliplatin today, return in 3 weeks for treatment and evaluation  2. Colonoscopy within 3 months as none preoperatively  3. Continue with OT  4. Touch base with PT to begin  5. Continue remeron to 30mg    6. Booster August 26th     DISCUSSION    Danny Hunt has tolerated his first cycle of capecitabine with no significant toxicities. We discussed him starting oxaliplatin today which he is amenable to. We reviewed potential side effects today. We additionally encouraged his use of his home walker along with participating with PT.     Patient was evaluated with the attending, Dr. Tye [place].    Thayer Headings, MD, MSc  Medical Oncology Fellow, PGY6      ______________________________________________________________________  HISTORY     Danny Hunt is seen today at the [facility] Medical Oncology [facility] for consultation regarding Stage IIIC sigmoid colon adenocarcinoma  at the request of Dr. Milbert Coulter.      Danny Hunt presented to [facility] ED with complaints of abdominal pain and distention due to constipation. CT abdomen/pelvis was concerning for primary colonic malignancy with functional closed-loop large bowel obstruction.??He was transferred to [facility] in [place], [place] under the care of the colorectal surgery team for further management. Due to his significant electrolyte abnormalities, he was placed on remote telemetry.  ??  [facility] gastroenterology service was consulted on admission for sigmoidoscopy and stent placement, which was completed on 01/20/2019. His flexible sigmoidoscopy was significant for complete colonic obstruction. The patient tolerated the procedure well but was noted to have AMS afterwards requiring a telesitter. He underwent laparoscopic sigmoidectomy on 01/25/2020. He tolerated the procedure well. He was discharged on 02/01/2020.     Of note, he has a prior colonoscopy showing colon cancer more than 5 years ago at a different hospital; he received no therapy and was told this resolved without intervention.    INTERVAL HISTORY:    Today, Mr. [name] is accompanied by his daughter. He continues with R arm weakness and RLE weakness. He has had multiple falls with the most recent fall 2.5 weeks ago. Prior to that he had a fall in a parking lot a month ago with no significant injury. He denies any unsteadiness or dizziness or weakness. He continues with OT but has not had PT. His energy waxes and wanes. He denies any fevers or chills. He has no mouth sores, new hand or other skin rashes.  MEDICAL HISTORY  1. Hypertension  2. prior CVA 2019, residual effect with dysarthric speech   3. Alzheimer's dementia   Past Medical History:   Diagnosis Date   ??? Cancer (CMS-HCC)    ??? Cognitive impairment    ??? Dementia (CMS-HCC)    ??? GERD (gastroesophageal reflux disease)    ??? History of stroke 09/22/2019   ??? History of tobacco abuse 09/22/2019   ??? Hypertension 09/22/2019   ??? Hypokalemia 09/22/2019   ??? Peptic ulcer    ??? Pneumonia due to COVID-19 virus 09/22/2019     Past Surgical History:   Procedure Laterality Date   ??? COLECTOMY     ??? IR INSERT PORT AGE GREATER THAN 5 YRS  03/07/2020    IR INSERT PORT AGE GREATER THAN 5 YRS 03/07/2020 Alease Frame, NP IMG VIR [facility]   ??? LAPAROSCOPIC CHOLECYSTECTOMY     ??? PR COLONOSCOPY FLX WITH ENDOSCOPIC STENT PLACEMENT N/A 01/20/2020    Procedure: SIGMOIDOSCOPY, FLEXIBLE; WITH ENDOSCOPIC STENT PLACEMENT (INCLUDES PRE- AND POST-DILATION AND GUIDE WIRE PASSAGE, WHEN PERFORMED);  Surgeon: Vonda Antigua, MD;  Location: MAIN OR [facility];  Service: Gastroenterology   ??? PR LAP,SURG,COLECTOMY, PARTIAL, W/ANAST N/A 01/25/2020    Procedure: LAPAROSCOPY, SURGICAL; COLECTOMY, PARTIAL WITH ANASTOMOSIS;  Surgeon: Sharyn Lull, MD;  Location: MAIN OR [facility];  Service: Gastrointestinal         No Known Allergies  Medications reviewed in the EMR  Current Outpatient Medications on File Prior to Visit   Medication Sig Dispense Refill   ??? amLODIPine (NORVASC) 2.5 MG tablet Take 2 tablets (5 mg total) by mouth daily. 60 tablet 3   ??? capecitabine (XELODA) 150 MG tablet Take 2 tablets (300 mg total) by mouth Two (2) times a day  with 1 other capecitabine prescription for 1,800 mg total. Take Days 1 through 14, then 1 week off. (Patient not taking: Reported on 04/16/2020) 56 tablet 6   ??? capecitabine (XELODA) 500 MG tablet Take 3 tablets (1,500 mg total) by mouth Two (2) times a day  with 1 other capecitabine prescription for 1,800 mg total. Take Days 1 through 14, then 1 week off. (Patient not taking: Reported on 04/16/2020) 84 tablet 6   ??? cetirizine (ZYRTEC) 10 MG tablet Take 10 mg by mouth. (Patient not taking: Reported on 04/16/2020)     ??? diphenhydrAMINE-acetaminophen (TYLENOL PM) 25-500 mg Tab Take 2 tablets by mouth.     ??? docusate sodium (COLACE) 100 MG capsule Take 100 mg by mouth.      ??? famotidine (PEPCID) 20 MG tablet Take 20 mg by mouth.     ??? heparin, porcine, PF, 100 unit/mL Syrg Infuse 5 mL into a venous catheter every fourteen (14) days. For use while patient is on fluorouracil (5-FU) home infusion.   Flush IV catheter with heparin 5 mL as directed after final saline flush per SASH method and as needed for line maintenance. 4 each PRN   ??? HYDROcodone-acetaminophen (NORCO) 5-325 mg per tablet Take 1 tablet by mouth every six (6) hours as needed for pain. 30 tablet 0   ??? loperamide (IMODIUM) 2 mg capsule Take 2 capsules to start, then 1 capsule every 2 hours until diarrhea free for 12 hours. (Patient not taking: Reported on 04/16/2020) 60 capsule 11   ??? melatonin 10 mg TbMP Take 1 tablet by mouth every evening.     ??? memantine (NAMENDA) 10 MG tablet Take 10 mg by mouth Two (2) times a day.     ???  mirtazapine (REMERON) 15 MG tablet Take 2 tablets (30 mg total) by mouth nightly. 90 tablet 0   ??? ondansetron (ZOFRAN) 8 MG tablet Take 1 tablet (8 mg total) by mouth every eight (8) hours as needed for nausea. (Patient not taking: Reported on 04/16/2020) 30 tablet 2   ??? oxyCODONE (ROXICODONE) 5 MG immediate release tablet Take 1/2 tablet (2.5mg ) by mouth every 4-6 hours as needed for pain (Patient not taking: Reported on 04/16/2020) 10 tablet 0   ??? [EXPIRED] potassium chloride (MICRO-K) 10 mEq CR capsule Take 1 capsule (10 mEq total) by mouth Two (2) times a day. 60 capsule 0   ??? prochlorperazine (COMPAZINE) 10 MG tablet Take 1 tablet (10 mg total) by mouth every six (6) hours as needed (nausea). (Patient not taking: Reported on 04/16/2020) 30 tablet 2   ??? sodium chloride (NS) 0.9 % injection Infuse 10 mL into a venous catheter every fourteen (14) days. For use while patient is on fluorouracil (5-FU) home infusion.   Flush IV catheter with normal saline 10 mL prior to and after infusion followed by heparin flush per SASH method and as needed for line maintenance. 6 each PRN   ??? traZODone (DESYREL) 50 MG tablet Take 75 mg by mouth. No current facility-administered medications on file prior to visit.         FAMILY HISTORY    No known family history of cancer. He has 4 children all healthy.     SOCIAL HISTORY    Presents with his daughter Babette Relic. She lives with him. 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 No prior tobacco history or recent alcohol use. No other illicit drug use. He enjoys spending time in the yard. He spends most of his day watching TV. His wife passed October 03, 2019 from COVID.     REVIEW OF SYSTEMS    The remainder of a comprehensive 10 systems review is negative.    PHYSICAL EXAM  BP 153/81  - Pulse 71  - Temp 36.9 ??C (98.4 ??F) (Temporal)  - Resp 16  - Wt 92.6 kg (204 lb 1.6 oz)  - SpO2 100%  - BMI 29.29 kg/m??    Wt Readings from Last 3 Encounters:   05/14/20 92.6 kg (204 lb 1.6 oz)   04/16/20 91.2 kg (201 lb 1.6 oz)   03/29/20 88.7 kg (195 lb 8 oz)       GENERAL:frail, dysathric speech, AO x 3  PSYCH: full and appropriate range of affect with good insight and judgement  HEENT: NCAT, pupils equal, sclerae anicteric, OP exam deferred with mask in place,  neck supple without thyromegaly or masses;   LYMPH: no cervical or supraclavicular adenopathy  PULM: normal resp rate, clear bilaterally without wheezes, rhonchi, rales  COR: regular without murmur, rub, gallop  GI: abdomen soft, nontender, nondistended, no palpable hepatosplenomegaly.   EXT: warm and well perfused, no edema  SKIN: no rash or ulcer  NEURO: dysarthric speech    OBJECTIVE DATA  Lab Results   Component Value Date    WBC 6.9 05/14/2020    HGB 12.8 (L) 05/14/2020    HCT 38.2 (L) 05/14/2020    PLT 251 05/14/2020       Lab Results   Component Value Date    NA 143 05/14/2020    K 3.4 05/14/2020    CL 107 05/14/2020    CO2 28.7 05/14/2020    BUN 9 05/14/2020    CREATININE 0.92 05/14/2020  GLU 92 05/14/2020    CALCIUM 9.4 05/14/2020    MG 2.0 05/14/2020    PHOS 3.2 02/01/2020       Lab Results   Component Value Date    BILITOT 0.3 04/16/2020 PROT 7.1 04/16/2020    ALBUMIN 3.5 04/16/2020    ALT 10 04/16/2020    AST 15 04/16/2020    ALKPHOS 102 04/16/2020       Lab Results   Component Value Date    PT 10.4 03/29/2020    INR 1.02 03/29/2020    APTT 23.0 03/29/2020       01/20/20 Flexible Sigmoidoscopy:  Findings:       A malignant-appearing, intrinsic severe stenosis measuring 2 mm (inner        diameter) was found in the proximal sigmoid colon and was non-traversed.        Biopsies were taken with a cold forceps for histology. Placement of a        0.025 inch x 450 cm angled Visiglide wire was attempted. This passed        successfully. This was stented with a 25 mm x 9 cm WallFlex stent under        fluoroscopic guidance. Stool flowed through the stent. The patient's        abdomen was soft but distended at the end of the procedure.                                                                                   Impression:            - Complete colonic obstruction due to malignancy.                          Biopsied. Prosthesis placed.    PATHOLOGY:  01/25/20:  Final Diagnosis   A: Sigmoid colon, sigmoidectomy  - Moderately differentiated invasive adenocarcinoma invading through the muscularis propria into pericolonic tissue (pT3)  - Lymphovascular invasion extensively present  - 9 of 21 lymph nodes positive for metastatic carcinoma (pN2b, including part A and part B of the specimen)  - All surgical margins free of carcinoma involvement  - Please see synoptic report  ??  B: Colon, proximal sigmoid, colectomy  - Segment of colonic mucosa with no evidence of carcinoma involvement  ??  This electronic signature is attestation that the pathologist personally reviewed the submitted material(s) and the final diagnosis reflects that evaluation.   Electronically signed by Karene Fry, MD on 01/30/2020 at 1704   Synoptic Report     COLON AND RECTUM: Resection, Including Transanal Disk Excision of Rectal Neoplasms  8th Edition - Protocol posted: 03/08/2018  COLON AND RECTUM: RESECTION - A  SPECIMEN   Procedure  Sigmoidectomy    TUMOR   Tumor Site  Sigmoid colon    Histologic Type  Adenocarcinoma    Histologic Grade  G2: Moderately differentiated    Tumor Size  Greatest dimension (Centimeters): 8.5 cm   Additional Dimension (Centimeters)  6 cm   Tumor Extension  Tumor invades through the muscularis propria into pericolorectal tissue    Macroscopic Tumor Perforation  Not identified  Lymphovascular Invasion  Present    Lymphovascular Invasion Type  Small vessel lymphovascular invasion      Large vessel (venous) invasion      Intramural      Extramural    Perineural Invasion  Not identified    Tumor Budding  Number of tumor buds in one ???hotspot??? field (total number in area = 0.785 mm2): 2      Low score (0-4)    Type of Polyp in Which Invasive Carcinoma Arose  Sessile serrated adenoma / sessile serrated polyp    Treatment Effect  No known presurgical therapy    MARGINS   Margins  All margins are uninvolved by invasive carcinoma, high grade dysplasia / intramucosal carcinoma, and low grade dysplasia    Margins Examined  Proximal      Distal      Radial (circumferential) or Mesenteric    Closest Margin  Proximal    LYMPH NODES   Number of Lymph Nodes Involved  9    Number of Lymph Nodes Examined  21    Tumor Deposits  Not identified    PATHOLOGIC STAGE CLASSIFICATION (pTNM, AJCC 8th Edition)      Primary Tumor (pT)  pT3    Regional Lymph Nodes (pN)  pN2b           RADIOLOGY RESULTS   No new imaging today

## 2020-05-13 NOTE — Unmapped (Signed)
Called to confirm with daughter that capecitabine was not restarted last week.  Patient was due for cycle 2 of CapeOx 05/07/20 (starting oxaliplatin that day) but was rescheduled.  Family held capecitabine last week appropriately and we are on schedule to start tomorrow.    Approximate time spent on the phone 5 mins.  No further questions at this time.

## 2020-05-14 ENCOUNTER — Institutional Professional Consult (permissible substitution): Admit: 2020-05-14 | Discharge: 2020-05-14 | Payer: MEDICARE

## 2020-05-14 ENCOUNTER — Encounter: Admit: 2020-05-14 | Discharge: 2020-05-14 | Payer: MEDICARE | Attending: Internal Medicine | Primary: Internal Medicine

## 2020-05-14 ENCOUNTER — Ambulatory Visit: Admit: 2020-05-14 | Discharge: 2020-05-14 | Payer: MEDICARE

## 2020-05-14 DIAGNOSIS — Z1159 Encounter for screening for other viral diseases: Principal | ICD-10-CM

## 2020-05-14 DIAGNOSIS — C187 Malignant neoplasm of sigmoid colon: Principal | ICD-10-CM

## 2020-05-14 DIAGNOSIS — R140 Abdominal distension (gaseous): Secondary | ICD-10-CM | POA: Diagnosis not present

## 2020-05-14 DIAGNOSIS — M6281 Muscle weakness (generalized): Secondary | ICD-10-CM | POA: Diagnosis not present

## 2020-05-14 DIAGNOSIS — Z66 Do not resuscitate: Secondary | ICD-10-CM | POA: Diagnosis not present

## 2020-05-14 DIAGNOSIS — G309 Alzheimer's disease, unspecified: Secondary | ICD-10-CM | POA: Diagnosis not present

## 2020-05-14 DIAGNOSIS — I10 Essential (primary) hypertension: Secondary | ICD-10-CM | POA: Diagnosis not present

## 2020-05-14 LAB — COMPREHENSIVE METABOLIC PANEL
ALBUMIN: 3.3 g/dL — ABNORMAL LOW (ref 3.4–5.0)
ALKALINE PHOSPHATASE: 83 U/L (ref 46–116)
ALT (SGPT): 17 U/L (ref 10–49)
ANION GAP: 7 mmol/L (ref 5–14)
AST (SGOT): 21 U/L (ref <=34)
BILIRUBIN TOTAL: 0.3 mg/dL (ref 0.3–1.2)
BLOOD UREA NITROGEN: 9 mg/dL (ref 9–23)
BUN / CREAT RATIO: 10
CALCIUM: 9.4 mg/dL (ref 8.7–10.4)
CHLORIDE: 107 mmol/L (ref 98–107)
CO2: 28.7 mmol/L (ref 20.0–31.0)
CREATININE: 0.92 mg/dL
EGFR CKD-EPI AA MALE: >90 mL/min/{1.73_m2} (ref >=60)
EGFR CKD-EPI NON-AA MALE: 83 mL/min/{1.73_m2} (ref >=60)
GLUCOSE RANDOM: 92 mg/dL (ref 70–179)
POTASSIUM: 3.4 mmol/L (ref 3.4–4.8)
PROTEIN TOTAL: 6.8 g/dL (ref 5.7–8.2)
SODIUM: 143 mmol/L (ref 135–145)

## 2020-05-14 LAB — CBC W/ AUTO DIFF
BASOPHILS ABSOLUTE COUNT: 0.1 10*9/L (ref 0.0–0.1)
BASOPHILS RELATIVE PERCENT: 1.2 %
EOSINOPHILS ABSOLUTE COUNT: 0.2 10*9/L (ref 0.0–0.5)
EOSINOPHILS RELATIVE PERCENT: 3.6 %
HEMATOCRIT: 38.2 % — ABNORMAL LOW (ref 39.0–48.0)
HEMOGLOBIN: 12.8 g/dL — ABNORMAL LOW (ref 12.9–16.5)
LYMPHOCYTES ABSOLUTE COUNT: 2.1 10*9/L (ref 1.1–3.6)
LYMPHOCYTES RELATIVE PERCENT: 29.7 %
MEAN CORPUSCULAR HEMOGLOBIN CONC: 33.4 g/dL (ref 32.0–36.0)
MEAN CORPUSCULAR HEMOGLOBIN: 28.6 pg (ref 25.9–32.4)
MEAN CORPUSCULAR VOLUME: 85.6 fL (ref 77.6–95.7)
MEAN PLATELET VOLUME: 6.8 fL (ref 6.8–10.7)
MONOCYTES ABSOLUTE COUNT: 0.6 10*9/L (ref 0.3–0.8)
MONOCYTES RELATIVE PERCENT: 8.3 %
NEUTROPHILS ABSOLUTE COUNT: 4 10*9/L (ref 1.8–7.8)
NEUTROPHILS RELATIVE PERCENT: 57.2 %
NUCLEATED RED BLOOD CELLS: 0 /100{WBCs} (ref <=4)
PLATELET COUNT: 251 10*9/L (ref 150–450)
RED BLOOD CELL COUNT: 4.47 10*12/L (ref 4.26–5.60)
RED CELL DISTRIBUTION WIDTH: 17.5 % — ABNORMAL HIGH (ref 12.2–15.2)
WBC ADJUSTED: 6.9 10*9/L (ref 3.6–11.2)

## 2020-05-14 LAB — MAGNESIUM: MAGNESIUM: 2 mg/dL (ref 1.6–2.6)

## 2020-05-14 MED ADMIN — ondansetron (ZOFRAN) tablet 24 mg: 24 mg | ORAL | @ 14:00:00 | Stop: 2020-05-14

## 2020-05-14 MED ADMIN — dextrose 5 % infusion: 100 mL/h | INTRAVENOUS | @ 15:00:00 | Stop: 2020-05-14

## 2020-05-14 MED ADMIN — dexAMETHasone (DECADRON) tablet 8 mg: 8 mg | ORAL | @ 14:00:00 | Stop: 2020-05-14

## 2020-05-14 MED ADMIN — oxaliplatin (ELOXATIN) 271.7 mg in dextrose 5 % 250 mL IVPB: 130 mg/m2 | INTRAVENOUS | @ 15:00:00 | Stop: 2020-05-14

## 2020-05-14 MED ADMIN — heparin, porcine (PF) 100 unit/mL injection 500 Units: 500 [IU] | INTRAVENOUS | @ 17:00:00 | Stop: 2020-05-14

## 2020-05-14 NOTE — Unmapped (Signed)
Clinical Support on 05/14/2020   Component Date Value Ref Range Status   ??? Sodium 05/14/2020 143  135 - 145 mmol/L Final   ??? Potassium 05/14/2020 3.4  3.4 - 4.8 mmol/L Final   ??? Chloride 05/14/2020 107  98 - 107 mmol/L Final   ??? Anion Gap 05/14/2020 7  5 - 14 mmol/L Final   ??? CO2 05/14/2020 28.7  20.0 - 31.0 mmol/L Final   ??? BUN 05/14/2020 9  9 - 23 mg/dL Final   ??? Creatinine 05/14/2020 0.92  0.60 - 1.10 mg/dL Final   ??? BUN/Creatinine Ratio 05/14/2020 10   Final   ??? EGFR CKD-EPI Non-African American,* 05/14/2020 83  >=60 mL/min/1.51m2 Final   ??? EGFR CKD-EPI African American, Male 05/14/2020 >90  >=60 mL/min/1.19m2 Final   ??? Glucose 05/14/2020 92  70 - 179 mg/dL Final   ??? Calcium 16/10/9602 9.4  8.7 - 10.4 mg/dL Final   ??? Albumin 54/09/8117 3.3 (A) 3.4 - 5.0 g/dL Final   ??? Total Protein 05/14/2020 6.8  5.7 - 8.2 g/dL Final   ??? Total Bilirubin 05/14/2020 0.3  0.3 - 1.2 mg/dL Final   ??? AST 14/78/2956 21  <=34 U/L Final   ??? ALT 05/14/2020 17  10 - 49 U/L Final   ??? Alkaline Phosphatase 05/14/2020 83  46 - 116 U/L Final   ??? Magnesium 05/14/2020 2.0  1.6 - 2.6 mg/dL Final   ??? WBC 21/30/8657 6.9  3.6 - 11.2 10*9/L Final   ??? RBC 05/14/2020 4.47  4.26 - 5.60 10*12/L Final   ??? HGB 05/14/2020 12.8 (A) 12.9 - 16.5 g/dL Final   ??? HCT 84/69/6295 38.2 (A) 39.0 - 48.0 % Final   ??? MCV 05/14/2020 85.6  77.6 - 95.7 fL Final   ??? MCH 05/14/2020 28.6  25.9 - 32.4 pg Final   ??? MCHC 05/14/2020 33.4  32.0 - 36.0 g/dL Final   ??? RDW 28/41/3244 17.5 (A) 12.2 - 15.2 % Final   ??? MPV 05/14/2020 6.8  6.8 - 10.7 fL Final   ??? Platelet 05/14/2020 251  150 - 450 10*9/L Final   ??? nRBC 05/14/2020 0  <=4 /100 WBCs Final   ??? Neutrophils % 05/14/2020 57.2  % Final   ??? Lymphocytes % 05/14/2020 29.7  % Final   ??? Monocytes % 05/14/2020 8.3  % Final   ??? Eosinophils % 05/14/2020 3.6  % Final   ??? Basophils % 05/14/2020 1.2  % Final   ??? Absolute Neutrophils 05/14/2020 4.0  1.8 - 7.8 10*9/L Final   ??? Absolute Lymphocytes 05/14/2020 2.1  1.1 - 3.6 10*9/L Final   ??? Absolute Monocytes 05/14/2020 0.6  0.3 - 0.8 10*9/L Final   ??? Absolute Eosinophils 05/14/2020 0.2  0.0 - 0.5 10*9/L Final   ??? Absolute Basophils 05/14/2020 0.1  0.0 - 0.1 10*9/L Final   ??? Anisocytosis 05/14/2020 Slight (A) Not Present Final

## 2020-05-14 NOTE — Unmapped (Signed)
VS taken. Labs drawn via [place] when pt met with Provider.  Port flushed and brisk blood return noted.  Labs within treatment parameters.    Premeds and IV fluids given.  Pt received Oxaliplatin - no adverse events noted.   AVS given.  Pt stable and ambulated independently from clinic accompanied by daughter.  No concerns voiced.  Ofilia Neas

## 2020-05-14 NOTE — Unmapped (Addendum)
I'm glad you are tolerating treatment. We are adding the 2nd drug today called oxaliplatin which we have reviewed previously. There is more information below for you to review.     We will see you back in 3 weeks.     Labwork:  Clinical Support on 05/14/2020   Component Date Value Ref Range Status   ??? WBC 05/14/2020 6.9  3.6 - 11.2 10*9/L Final   ??? RBC 05/14/2020 4.47  4.26 - 5.60 10*12/L Final   ??? HGB 05/14/2020 12.8 (A) 12.9 - 16.5 g/dL Final   ??? HCT 16/10/9602 38.2 (A) 39.0 - 48.0 % Final   ??? MCV 05/14/2020 85.6  77.6 - 95.7 fL Final   ??? MCH 05/14/2020 28.6  25.9 - 32.4 pg Final   ??? MCHC 05/14/2020 33.4  32.0 - 36.0 g/dL Final   ??? RDW 54/09/8117 17.5 (A) 12.2 - 15.2 % Final   ??? MPV 05/14/2020 6.8  6.8 - 10.7 fL Final   ??? Platelet 05/14/2020 251  150 - 450 10*9/L Final   ??? nRBC 05/14/2020 0  <=4 /100 WBCs Final   ??? Neutrophils % 05/14/2020 57.2  % Final   ??? Lymphocytes % 05/14/2020 29.7  % Final   ??? Monocytes % 05/14/2020 8.3  % Final   ??? Eosinophils % 05/14/2020 3.6  % Final   ??? Basophils % 05/14/2020 1.2  % Final   ??? Absolute Neutrophils 05/14/2020 4.0  1.8 - 7.8 10*9/L Final   ??? Absolute Lymphocytes 05/14/2020 2.1  1.1 - 3.6 10*9/L Final   ??? Absolute Monocytes 05/14/2020 0.6  0.3 - 0.8 10*9/L Final   ??? Absolute Eosinophils 05/14/2020 0.2  0.0 - 0.5 10*9/L Final   ??? Absolute Basophils 05/14/2020 0.1  0.0 - 0.1 10*9/L Final   ??? Anisocytosis 05/14/2020 Slight (A) Not Present Final       Please call us if you experience:   1. Nausea or vomiting not controlled by nausea medicines  2. Fever of 100.4 F or higher   3. Uncontrolled pain  4. Any other concerning symptom     For any cancer-related concerns, including:   Appointment information   Questions regarding your cancer diagnosis or treatment   Any new symptoms.     Please call [phone].    For EMERGENCIES ONLY, on Nights, Weekends and Holidays please call and ask for the oncologist on call.    [facility]  [address]  [place], [place] [address]  [email or web address]  Patient Education        oxaliplatin  Pronunciation:  ox AL i PLA tin  Brand:  Eloxatin  What is the most important information I should know about oxaliplatin?  Oxaliplatin can cause a severe or life-threatening allergic reaction. Get emergency medical help if you have: rash, hives, itching, sweating; chest pain, warmth or redness in your face, feeling light-headed; sudden cough, difficult breathing; swelling of your face, lips, tongue, or throat.  What is oxaliplatin?  Oxaliplatin is used together with other cancer medications to treat colon and rectal cancer.  Oxaliplatin may also be used for purposes not listed in this medication guide.  What should I discuss with my healthcare provider before receiving oxaliplatin?  You should not be treated with this medicine if you have ever had an allergic reaction to oxaliplatin or similar medications such as carboplatin (Paraplatin) or cisplatin (Platinol).  Tell your doctor if you have ever had:  ?? an active or recent infection;  ?? kidney disease;  ??  liver disease;  ?? heart disease, heart rhythm disorder;  ?? long QT syndrome (in you or a family member);  ?? an electrolyte imbalance (such as low levels of calcium, potassium, or magnesium in your blood);  ?? breathing disorder; or  ?? a nerve problem.  Oxaliplatin can harm an unborn baby  if the mother or the father is using this medicine.  ?? If you are a woman, do not use oxaliplatin if you are pregnant. Use effective birth control to prevent pregnancy while you are using this medicine and for at least 9 months after your last dose.  ?? If you are a man, use effective birth control if your sex partner is able to get pregnant. Keep using birth control for at least 6 months after your last dose.  ?? Tell your doctor right away if a pregnancy occurs while either the mother or the father is using oxaliplatin.  This medicine may affect fertility (ability to have children) in both men and women. However, it is important to use birth control to prevent pregnancy because oxaliplatin can harm an unborn baby.  Do not breastfeed while using this medicine,  and for at least 3 months after your last dose.  How is oxaliplatin given?  Oxaliplatin is given as an infusion into a vein. A healthcare provider will give you this injection.  Oxaliplatin must be given slowly, and the infusion can take at least 2 hours to complete.  Oxaliplatin is usually given once every 2 weeks. Your doctor will determine how long to treat you with this medicine.  You may be given medication to prevent nausea or vomiting.  Receiving oxaliplatin can make you more sensitive to cold, which can cause numbness, tingling, and muscle spasms. This includes exposure to cold temperature and coming into contact with cold objects. To prevent discomfort, follow these steps:  ?? do not inhale deeply when you are exposed to cold air;  ?? cover your skin, head, and face when you are outside in cold temperatures;  ?? wear gloves when handling cold objects or refrigerated foods;  ?? do not run an air conditioner at very cool temperature in your home or car (even during hot weather);  ?? do not drink cold drinks or use ice cubes in drinks;  ?? do not put ice packs on your body.  Chemotherapy often causes nausea or mouth sores. Do not eat ice chips to ease these discomforts because you will be more sensitive to cold. Talk to your doctor about other ways to treat nausea or mouth sores. You may be given other medications to prevent nausea or vomiting while you are receiving oxaliplatin.  Oxaliplatin can lower your blood cell counts. Your blood will need to be tested often. Your cancer treatments may be delayed based on the results.  Your heart function may need to be checked using an electrocardiograph or ECG (sometimes called an EKG).  What happens if I miss a dose?  Contact your doctor if you miss an appointment for your oxaliplatin injection.  What happens if I overdose?  Seek emergency medical attention or call the Poison Help line at [phone].  What should I avoid while receiving oxaliplatin?  Avoid cold temperatures and cold objects, including ice, cold drinks, and skin exposure to cold temperatures.  Avoid being near people who are sick or have infections. Tell your doctor at once if you develop signs of infection.  This medicine may cause blurred vision and may impair your reactions. Avoid driving  or hazardous activity until you know how this medicine will affect you.  What are the possible side effects of oxaliplatin?  Oxaliplatin can cause a severe or life-threatening allergic reaction. Some people receiving a oxaliplatin injection have had a reaction to the infusion within minutes after the medicine is injected into the vein. Tell your caregiver right away if you feel dizzy, short of breath, confused, sweaty, itchy, or have diarrhea, chest pain, warmth or redness in your face, or feel like you might pass out.  Get emergency medical help if you have signs of an allergic reaction:  hives; difficult breathing; swelling of your face, lips, tongue, or throat.  Call your doctor at once if you have:  ?? increased sensitivity to cold temperatures and cold objects;  ?? numbness, tingling, or burning pain that interferes with daily activities;  ?? severe or ongoing diarrhea or vomiting;  ?? confusion, change in mental status, vision problems, seizure (convulsions);  ?? pain or burning when you urinate;  ?? sudden chest pain or discomfort, wheezing, dry cough, feeling short of breath;  ?? pain, redness, swelling, or skin changes where the injection was given;  ?? dehydration symptoms --feeling very thirsty or hot, being unable to urinate, heavy sweating, or hot and dry skin;  ?? heart problems --headache with chest pain and severe dizziness, fainting, fast or pounding heartbeats;  ?? liver problems --nausea, upper stomach pain, itching, tiredness, loss of appetite, dark urine, clay-colored stools, jaundice (yellowing of the skin or eyes);  ?? muscle problems --unexplained muscle pain, tenderness, or weakness especially if you also have fever, unusual tiredness, and dark colored urine;  ?? nerve problems --jaw or chest tightness, eye pain, strange feeling in your tongue, problems with speech or swallowing; or  ?? low blood cell counts --fever, chills, tiredness, mouth sores, skin sores, easy bruising, unusual bleeding, pale skin, cold hands and feet, feeling light-headed or short of breath.  Common side effects may include:  ?? nausea, vomiting, diarrhea;  ?? numbness, tingling, burning pain;  ?? low blood cell counts;  ?? abnormal liver function tests;  ?? mouth sores; or  ?? feeling tired.  This is not a complete list of side effects and others may occur. Call your doctor for medical advice about side effects. You may report side effects to FDA at 1-800-FDA-1088.  What other drugs will affect oxaliplatin?  Other drugs may affect oxaliplatin, including prescription and over-the-counter medicines, vitamins, and herbal products. Tell your doctor about all your current medicines and any medicine you start or stop using.  Where can I get more information?  Your doctor or pharmacist can provide more information about oxaliplatin.  Remember, keep this and all other medicines out of the reach of children, never share your medicines with others, and use this medication only for the indication prescribed.   Every effort has been made to ensure that the information provided by [employer] ('[employer]') is accurate, up-to-date, and complete, but no guarantee is made to that effect. Drug information contained herein may be time sensitive. [employer] information has been compiled for use by healthcare practitioners and consumers in the [place] and therefore [employer] does not warrant that uses outside of the [place] are appropriate, unless specifically indicated otherwise. [employer]'s drug information does not endorse drugs, diagnose patients or recommend therapy. [employer]'s drug information is an Armed forces technical officer designed to assist licensed healthcare practitioners in caring for their patients and/or to serve consumers viewing this service as a supplement to, and  not a substitute for, the expertise, skill, knowledge and judgment of healthcare practitioners. The absence of a warning for a given drug or drug combination in no way should be construed to indicate that the drug or drug combination is safe, effective or appropriate for any given patient. [employer] does not assume any responsibility for any aspect of healthcare administered with the aid of information [employer] provides. The information contained herein is not intended to cover all possible uses, directions, precautions, warnings, drug interactions, allergic reactions, or adverse effects. If you have questions about the drugs you are taking, check with your doctor, nurse or pharmacist.  Copyright [phone] [phone] [employer] Version: 10.01. Revision date: 08/04/2018.  Care instructions adapted under license by [facility]. If you have questions about a medical condition or this instruction, always ask your healthcare professional. [employer] disclaims any warranty or liability for your use of this information.

## 2020-05-15 ENCOUNTER — Other Ambulatory Visit: Payer: Self-pay | Admitting: Family Medicine

## 2020-05-15 DIAGNOSIS — Z8673 Personal history of transient ischemic attack (TIA), and cerebral infarction without residual deficits: Secondary | ICD-10-CM | POA: Diagnosis not present

## 2020-05-15 DIAGNOSIS — Z87891 Personal history of nicotine dependence: Secondary | ICD-10-CM | POA: Diagnosis not present

## 2020-05-15 DIAGNOSIS — Z9049 Acquired absence of other specified parts of digestive tract: Secondary | ICD-10-CM | POA: Diagnosis not present

## 2020-05-15 DIAGNOSIS — C779 Secondary and unspecified malignant neoplasm of lymph node, unspecified: Secondary | ICD-10-CM | POA: Diagnosis not present

## 2020-05-15 DIAGNOSIS — C187 Malignant neoplasm of sigmoid colon: Secondary | ICD-10-CM | POA: Diagnosis not present

## 2020-05-15 DIAGNOSIS — E871 Hypo-osmolality and hyponatremia: Secondary | ICD-10-CM | POA: Diagnosis not present

## 2020-05-15 DIAGNOSIS — Z8616 Personal history of COVID-19: Secondary | ICD-10-CM | POA: Diagnosis not present

## 2020-05-15 DIAGNOSIS — I10 Essential (primary) hypertension: Secondary | ICD-10-CM | POA: Diagnosis not present

## 2020-05-15 DIAGNOSIS — N3000 Acute cystitis without hematuria: Secondary | ICD-10-CM

## 2020-05-15 DIAGNOSIS — K219 Gastro-esophageal reflux disease without esophagitis: Secondary | ICD-10-CM | POA: Diagnosis not present

## 2020-05-15 DIAGNOSIS — N390 Urinary tract infection, site not specified: Secondary | ICD-10-CM | POA: Diagnosis not present

## 2020-05-15 DIAGNOSIS — Z7901 Long term (current) use of anticoagulants: Secondary | ICD-10-CM | POA: Diagnosis not present

## 2020-05-15 MED ORDER — SULFAMETHOXAZOLE 800 MG-TRIMETHOPRIM 160 MG TABLET
Freq: Two times a day (BID) | ORAL | 0 days
Start: 2020-05-15 — End: ?

## 2020-05-15 MED ORDER — SULFAMETHOXAZOLE-TRIMETHOPRIM 800-160 MG PO TABS: 1.0000 | ORAL_TABLET | Freq: Two times a day (BID) | ORAL | 0 refills | Status: AC

## 2020-05-15 NOTE — Progress Notes (Signed)
I do know if it was treated already or not but I just got a report [facility] the [facility]inalysis that they did which showed that he did grow Klebsiella bacteria in his urine, sent Bactrim for him, if he was already previously treated then ignore that but if not I sent Bactrim for him to take.

## 2020-05-15 NOTE — Progress Notes (Signed)
Left message to call back  

## 2020-05-19 NOTE — Unmapped (Signed)
Attempt x 2 to reach patient to check in. Oxaliplatin added to regimen on Wed, 5/4.     Left voicemails for daughter, Babette Relic on Friday, 5/6 and Monday, 5/9. Encouraged a call back, left contact information.

## 2020-05-22 ENCOUNTER — Other Ambulatory Visit (HOSPITAL_COMMUNITY): Payer: Self-pay | Admitting: Neurology

## 2020-05-22 DIAGNOSIS — M25561 Pain in right knee: Secondary | ICD-10-CM | POA: Diagnosis not present

## 2020-05-22 DIAGNOSIS — M542 Cervicalgia: Secondary | ICD-10-CM

## 2020-05-22 DIAGNOSIS — G894 Chronic pain syndrome: Secondary | ICD-10-CM | POA: Diagnosis not present

## 2020-05-22 DIAGNOSIS — M25511 Pain in right shoulder: Secondary | ICD-10-CM | POA: Diagnosis not present

## 2020-05-22 DIAGNOSIS — Z79899 Other long term (current) drug therapy: Secondary | ICD-10-CM | POA: Diagnosis not present

## 2020-05-22 DIAGNOSIS — E559 Vitamin D deficiency, unspecified: Secondary | ICD-10-CM | POA: Diagnosis not present

## 2020-05-22 DIAGNOSIS — D509 Iron deficiency anemia, unspecified: Secondary | ICD-10-CM | POA: Diagnosis not present

## 2020-05-22 DIAGNOSIS — E538 Deficiency of other specified B group vitamins: Secondary | ICD-10-CM | POA: Diagnosis not present

## 2020-05-22 DIAGNOSIS — G629 Polyneuropathy, unspecified: Secondary | ICD-10-CM | POA: Diagnosis not present

## 2020-05-22 DIAGNOSIS — R7303 Prediabetes: Secondary | ICD-10-CM | POA: Diagnosis not present

## 2020-05-22 DIAGNOSIS — M545 Low back pain, unspecified: Secondary | ICD-10-CM

## 2020-05-22 MED ORDER — GABAPENTIN 100 MG CAPSULE
0 days
Start: 2020-05-22 — End: ?

## 2020-05-22 NOTE — Unmapped (Signed)
Called and spoke with [name], pts daughter and gave option of coming to [facility] on 5/25 to see Dr Tye Maryland and then come back on 5/27 for infusion due to over capacity on 5/25 or going to [facility] on 5/27 for everything and they decided to do everything in one day. Labs and appt scheduled in [facility] 2:30 and 3:30 and infusion order sent to [facility]. Daughter and pt verbally confirmed appts

## 2020-05-22 NOTE — Unmapped (Signed)
05/22/20 confirmed 6/15 @ 12:25pm.

## 2020-05-23 NOTE — Unmapped (Signed)
[facility] has made a third and final attempt to reach this patient to refill the following medication:Capecitabine.      We have left voicemails on the following phone numbers: [phone] and have sent a MyChart message.    Dates contacted: 5/3,9,13  Last scheduled delivery: 05/01/20    The patient may be at risk of non-compliance with this medication. The patient should call the [facility] at [phone] (option 4) to refill medication.    [name] Hulda Humphrey   [facility] Specialty Technician

## 2020-05-26 NOTE — Unmapped (Signed)
[facility] Refill Coordination Note    Specialty Medication(s) to be Shipped:   Hematology/Oncology: Capecitabine 150mg , directions: Take 2 tablets (300 mg total) by mouth Two (2) times a day  with 1 other capecitabine prescription for 1,800 mg total. Take Days 1 through 14, then 1 week off and Capecitabine 500mg , directions: Take 3 tablets (1,500 mg total) by mouth Two (2) times a day  with 1 other capecitabine prescription for 1,800 mg total. Take Days 1 through 14, then 1 week off    Other medication(s) to be shipped: No additional medications requested for fill at this time     Danny Hunt, DOB: Dec 21, 1950  Phone: [phone] (home)       All above HIPAA information was verified with patient's family member, Daughter.     Was a Nurse, learning disability used for this call? No    Completed refill call assessment today to schedule patient's medication shipment from the [facility] [phone]).  All relevant notes have been reviewed.     Specialty medication(s) and dose(s) confirmed: Regimen is correct and unchanged.   Changes to medications: [name] reports no changes at this time.  Changes to insurance: No  New side effects reported not previously addressed with a pharmacist or physician: None reported  Questions for the pharmacist: No    Confirmed patient received a Conservation officer, historic buildings and a Surveyor, mining with first shipment. The patient will receive a drug information handout for each medication shipped and additional FDA Medication Guides as required.       DISEASE/MEDICATION-SPECIFIC INFORMATION        N/A    SPECIALTY MEDICATION ADHERENCE     Medication Adherence    Patient reported X missed doses in the last month: 0  Specialty Medication: Capecitabine 500mg   Patient is on additional specialty medications: Yes  Additional Specialty Medications: Capecitabine 150mg   Patient Reported Additional Medication X Missed Doses in the Last Month: 0  Patient is on more than two specialty medications: No  Informant: child/children              Were doses missed due to medication being on hold? No    Capecitabine 500 mg: 2 days of medicine on hand.  Next cycle starts 06/06/20  Capecitabine 150 mg: 2 days of medicine on hand       REFERRAL TO PHARMACIST     Referral to the pharmacist: Not needed      [facility]     Shipping address confirmed in Epic.     Delivery Scheduled: Yes, Expected medication delivery date: 06/04/20.     Medication will be delivered via UPS to the prescription address in Epic [place].    Danny Hunt   [facility] Specialty Technician

## 2020-05-26 NOTE — Unmapped (Signed)
Called to get clarification on capecitabine.  Patient/daughter did not respond to previous requests from specialty pharmacy.  Will try back in an hour.

## 2020-05-26 NOTE — Unmapped (Signed)
I spoke with Danny Hunt daughter Danny Hunt.  I relayed that the specialty pharmacy was not able to get a hold of her and we wanted to make sure she had enough capecitabine for Danny Hunt.  She reports that she has had enough for this cycle and that the patient will run out on Wednesday before his week break. We reviewed the medication schedule with the two different pills and Danny Hunt confirmed the current twice daily dosing.  We discussed that she will need to call the specialty pharmacy prior to the next treatment to get more (i'll send them a message though as an update and perhaps they can reinitiate calling them)    I asked how Danny Hunt was doing with the chemotherapy and she reported a few different issues.    - neuropathy in feet as diagnoses by pain clinic. Numbness in feet reported. Patient not wanting to walk around as much and not using walker much.    -food tastes like dirt  -fatigue - patient energy level and activities reportedly down  -nausea - mild - had to take a few nausea pills  -diarrhea - mild.  Has loperamide.    -hand-foot syndrome - denies    [name] also reports a recent diagnosis of Parkinson's.  I encouraged the daughter to reach out to her team if needs anything this next week.  Will follow-up with specialty pharmacy.  No further questions at this time.

## 2020-05-27 NOTE — Progress Notes (Signed)
Left message to call back  

## 2020-05-27 NOTE — Unmapped (Signed)
I spoke with daughter of patient Danny Hunt to confirm appointments on the following date(s): 06/06/20 for 10am labs, 11am Dr. Tye Maryland, and 12pm infusion.     Estevan Oaks

## 2020-05-28 ENCOUNTER — Telehealth: Payer: Self-pay | Admitting: Nurse Practitioner

## 2020-05-28 ENCOUNTER — Other Ambulatory Visit: Payer: Medicare Other | Admitting: Nurse Practitioner

## 2020-05-28 ENCOUNTER — Other Ambulatory Visit: Payer: Self-pay

## 2020-05-31 ENCOUNTER — Other Ambulatory Visit: Payer: Self-pay | Admitting: Family Medicine

## 2020-05-31 DIAGNOSIS — I10 Essential (primary) hypertension: Secondary | ICD-10-CM

## 2020-06-02 NOTE — Telephone Encounter (Signed)
[place]er NTBS 30 days given 05/02/20

## 2020-06-03 MED FILL — CAPECITABINE 500 MG TABLET: ORAL | 21 days supply | Qty: 84 | Fill #2

## 2020-06-03 MED FILL — CAPECITABINE 150 MG TABLET: ORAL | 21 days supply | Qty: 56 | Fill #2

## 2020-06-06 ENCOUNTER — Emergency Department: Admit: 2020-06-06 | Discharge: 2020-06-06 | Disposition: A | Discharge status: Home / Self Care | Payer: MEDICARE

## 2020-06-06 ENCOUNTER — Ambulatory Visit
Admit: 2020-06-06 | Discharge: 2020-06-06 | Disposition: A | Discharge status: Home / Self Care | Payer: MEDICARE | Attending: Internal Medicine | Primary: Internal Medicine

## 2020-06-06 ENCOUNTER — Encounter
Admit: 2020-06-06 | Discharge: 2020-06-07 | Disposition: A | Discharge status: Home / Self Care | Payer: MEDICARE | Attending: Emergency Medicine

## 2020-06-06 ENCOUNTER — Other Ambulatory Visit: Admit: 2020-06-06 | Discharge: 2020-06-06 | Disposition: A | Discharge status: Home / Self Care | Payer: MEDICARE

## 2020-06-06 DIAGNOSIS — C187 Malignant neoplasm of sigmoid colon: Principal | ICD-10-CM

## 2020-06-06 DIAGNOSIS — Z1159 Encounter for screening for other viral diseases: Principal | ICD-10-CM

## 2020-06-06 DIAGNOSIS — Z9049 Acquired absence of other specified parts of digestive tract: Secondary | ICD-10-CM | POA: Diagnosis not present

## 2020-06-06 DIAGNOSIS — R29818 Other symptoms and signs involving the nervous system: Secondary | ICD-10-CM | POA: Diagnosis not present

## 2020-06-06 DIAGNOSIS — Z79899 Other long term (current) drug therapy: Secondary | ICD-10-CM | POA: Diagnosis not present

## 2020-06-06 DIAGNOSIS — R918 Other nonspecific abnormal finding of lung field: Secondary | ICD-10-CM | POA: Diagnosis not present

## 2020-06-06 DIAGNOSIS — R29810 Facial weakness: Secondary | ICD-10-CM | POA: Diagnosis not present

## 2020-06-06 DIAGNOSIS — G309 Alzheimer's disease, unspecified: Secondary | ICD-10-CM | POA: Diagnosis not present

## 2020-06-06 DIAGNOSIS — Z7901 Long term (current) use of anticoagulants: Secondary | ICD-10-CM | POA: Diagnosis not present

## 2020-06-06 DIAGNOSIS — I10 Essential (primary) hypertension: Secondary | ICD-10-CM | POA: Diagnosis not present

## 2020-06-06 DIAGNOSIS — Z8673 Personal history of transient ischemic attack (TIA), and cerebral infarction without residual deficits: Secondary | ICD-10-CM | POA: Diagnosis not present

## 2020-06-06 DIAGNOSIS — Z9221 Personal history of antineoplastic chemotherapy: Secondary | ICD-10-CM | POA: Diagnosis not present

## 2020-06-06 DIAGNOSIS — Z66 Do not resuscitate: Secondary | ICD-10-CM | POA: Diagnosis not present

## 2020-06-06 DIAGNOSIS — R471 Dysarthria and anarthria: Secondary | ICD-10-CM | POA: Diagnosis not present

## 2020-06-06 DIAGNOSIS — G20 Parkinson's disease: Secondary | ICD-10-CM | POA: Diagnosis not present

## 2020-06-06 LAB — CBC W/ AUTO DIFF
BASOPHILS ABSOLUTE COUNT: 0.2 10*9/L — ABNORMAL HIGH (ref 0.0–0.1)
BASOPHILS ABSOLUTE COUNT: 0.1 10*9/L (ref 0.0–0.1)
BASOPHILS RELATIVE PERCENT: 2.4 %
BASOPHILS RELATIVE PERCENT: 1.3 %
EOSINOPHILS ABSOLUTE COUNT: 0.5 10*9/L (ref 0.0–0.5)
EOSINOPHILS ABSOLUTE COUNT: 0.4 10*9/L (ref 0.0–0.5)
EOSINOPHILS RELATIVE PERCENT: 6.9 %
EOSINOPHILS RELATIVE PERCENT: 6.8 %
HEMATOCRIT: 36.6 % — ABNORMAL LOW (ref 39.0–48.0)
HEMATOCRIT: 36.2 % — ABNORMAL LOW (ref 39.0–48.0)
HEMOGLOBIN: 12.5 g/dL — ABNORMAL LOW (ref 12.9–16.5)
HEMOGLOBIN: 12.3 g/dL — ABNORMAL LOW (ref 12.9–16.5)
LYMPHOCYTES ABSOLUTE COUNT: 1.9 10*9/L (ref 1.1–3.6)
LYMPHOCYTES ABSOLUTE COUNT: 1.9 10*9/L (ref 1.1–3.6)
LYMPHOCYTES RELATIVE PERCENT: 27.8 %
LYMPHOCYTES RELATIVE PERCENT: 29.5 %
MEAN CORPUSCULAR HEMOGLOBIN CONC: 34.2 g/dL (ref 32.0–36.0)
MEAN CORPUSCULAR HEMOGLOBIN CONC: 33.8 g/dL (ref 32.0–36.0)
MEAN CORPUSCULAR HEMOGLOBIN: 29.9 pg (ref 25.9–32.4)
MEAN CORPUSCULAR HEMOGLOBIN: 29.6 pg (ref 25.9–32.4)
MEAN CORPUSCULAR VOLUME: 87.3 fL (ref 77.6–95.7)
MEAN CORPUSCULAR VOLUME: 87.4 fL (ref 77.6–95.7)
MEAN PLATELET VOLUME: 7 fL (ref 6.8–10.7)
MEAN PLATELET VOLUME: 6.9 fL (ref 6.8–10.7)
MONOCYTES ABSOLUTE COUNT: 0.8 10*9/L (ref 0.3–0.8)
MONOCYTES ABSOLUTE COUNT: 0.8 10*9/L (ref 0.3–0.8)
MONOCYTES RELATIVE PERCENT: 11.6 %
MONOCYTES RELATIVE PERCENT: 12.5 %
NEUTROPHILS ABSOLUTE COUNT: 3.4 10*9/L (ref 1.8–7.8)
NEUTROPHILS ABSOLUTE COUNT: 3.2 10*9/L (ref 1.8–7.8)
NEUTROPHILS RELATIVE PERCENT: 51.3 %
NEUTROPHILS RELATIVE PERCENT: 49.9 %
PLATELET COUNT: 221 10*9/L (ref 150–450)
PLATELET COUNT: 205 10*9/L (ref 150–450)
RED BLOOD CELL COUNT: 4.19 10*12/L — ABNORMAL LOW (ref 4.26–5.60)
RED BLOOD CELL COUNT: 4.14 10*12/L — ABNORMAL LOW (ref 4.26–5.60)
RED CELL DISTRIBUTION WIDTH: 21.1 % — ABNORMAL HIGH (ref 12.2–15.2)
RED CELL DISTRIBUTION WIDTH: 21.8 % — ABNORMAL HIGH (ref 12.2–15.2)
WBC ADJUSTED: 6.7 10*9/L (ref 3.6–11.2)
WBC ADJUSTED: 6.4 10*9/L (ref 3.6–11.2)

## 2020-06-06 LAB — URINALYSIS WITH CULTURE REFLEX
BACTERIA: NONE SEEN /HPF
BILIRUBIN UA: NEGATIVE
BLOOD UA: NEGATIVE
GLUCOSE UA: NEGATIVE
KETONES UA: NEGATIVE
NITRITE UA: NEGATIVE
PH UA: 5 (ref 5.0–9.0)
PROTEIN UA: NEGATIVE
RBC UA: 1 /HPF (ref <=3)
SPECIFIC GRAVITY UA: 1.013 (ref 1.003–1.030)
SQUAMOUS EPITHELIAL: <1 /HPF (ref 0–5)
UROBILINOGEN UA: 0.2
WBC UA: 14 /HPF — ABNORMAL HIGH (ref <=2)

## 2020-06-06 LAB — COMPREHENSIVE METABOLIC PANEL
ALBUMIN: 3.2 g/dL — ABNORMAL LOW (ref 3.4–5.0)
ALKALINE PHOSPHATASE: 93 U/L (ref 46–116)
ALT (SGPT): 8 U/L — ABNORMAL LOW (ref 10–49)
ANION GAP: 6 mmol/L (ref 5–14)
AST (SGOT): 20 U/L (ref <=34)
BILIRUBIN TOTAL: 0.5 mg/dL (ref 0.3–1.2)
BLOOD UREA NITROGEN: 13 mg/dL (ref 9–23)
BUN / CREAT RATIO: 13
CALCIUM: 9.2 mg/dL (ref 8.7–10.4)
CHLORIDE: 108 mmol/L — ABNORMAL HIGH (ref 98–107)
CO2: 24 mmol/L (ref 20.0–31.0)
CREATININE: 0.98 mg/dL
EGFR CKD-EPI (2021) MALE: 82 mL/min/{1.73_m2} (ref >=60)
GLUCOSE RANDOM: 89 mg/dL (ref 70–179)
POTASSIUM: 3.9 mmol/L (ref 3.4–4.8)
PROTEIN TOTAL: 6.4 g/dL (ref 5.7–8.2)
SODIUM: 138 mmol/L (ref 135–145)

## 2020-06-06 LAB — APTT
APTT: 30.9 s (ref 24.9–36.9)
HEPARIN CORRELATION: 0.2

## 2020-06-06 LAB — PROTIME-INR
INR: 0.98
PROTIME: 11.5 s (ref 10.3–13.4)

## 2020-06-06 LAB — HIGH SENSITIVITY TROPONIN I - SINGLE: HIGH SENSITIVITY TROPONIN I: 4 ng/L (ref <=53)

## 2020-06-06 LAB — POTASSIUM: POTASSIUM: 3.6 mmol/L (ref 3.4–4.8)

## 2020-06-06 LAB — MAGNESIUM: MAGNESIUM: 1.9 mg/dL (ref 1.6–2.6)

## 2020-06-06 LAB — CREATININE
CREATININE: 0.99 mg/dL
EGFR CKD-EPI (2021) MALE: 81 mL/min/{1.73_m2} (ref >=60)

## 2020-06-06 NOTE — Unmapped (Signed)
[facility]  Emergency Department Provider Note    ED Clinical Impression     Final diagnoses:   Altered mental status, unspecified altered mental status type (Primary)   Dysarthria   Facial droop       Initial Impression, ED Course, Assessment and Plan     Impression: 70 y.o. male with a past medical history of CVA, cognitive impairment, dementia, hypertension, tobacco use, stage III colon cancer presenting today with altered mental status.  Vital signs with blood pressure 140/91. On exam, the patient is Dysarthric speech, left facial droop, midline tongue, oriented to self and place only, no pronator drift in upper or lower extremities, 4+ strength in upper extremity and lower extremity on the left, 5/5 strength in right upper and lower extremity.    Started this time is for possible new CVA versus recrudescence of prior CVA.  Also consider other causes of weakness such as UTI, electrolyte disturbance, anemia.  We will also obtain cardiac work-up given patient's not able to fully communicate well with due to his dementia and make sure that patient's weakness is not due to an atypical ACS presentation.    ED Course as of 06/06/20 1459   Fri Jun 06, 2020   1341 CT Head Wo Contrast  Negative.   1458 Signed out to Dr. Venancio Poisson at shift change. Given concern for acute CVA, have paged neurology to discussed as well as ordered MRI.       Additional Medical Decision Making     I have reviewed the vital signs and the nursing notes. Labs and radiology results that were available during my care of the patient were independently reviewed by me and considered in my medical decision making.     I staffed the case with the ED attending, Dr. Ezekiel Slocumb.    I independently visualized the EKG tracing.   I independently visualized the radiology images.   I reviewed the patient's prior medical records that were available for viewing.     Portions of this record have been created using Scientist, clinical (histocompatibility and immunogenetics). Dictation errors have been sought, but may not have been identified and corrected.  ____________________________________________       History     Chief Complaint  Altered Mental Status      HPI   Danny Hunt is a 70 y.o. male with a past medical history of CVA, cognitive impairment, dementia, hypertension, tobacco use, stage III colon cancer presenting today with altered mental status.  Patient was brought down from infusion clinic where he was going for chemo follow-up for concern of left-sided facial droop and dysarthria.  My exam, patient denies any chest pain, shortness of breath.  He denies any headache at this time.  Denies any recent falls.    Patient's daughter reports that 2 days ago she noticed that his right leg seemed weak and now it is the left leg.  She does report he is got chronic left-sided weakness from prior strokes.  So that his speech is more dysarthric than normal.  Also he does not normally have a left-sided facial droop.  Daughter reports at baseline patient is not oriented to month or year. Also reports he has hallucinations at baseline. Uses a walker at baseline. Daughter and her husband had to assist patient significantly to get him to doctor's appointment today which is not normal.  Daughter reports patient has had a history of 1 large stroke and 2 smaller strokes in the past.  Reports that  he is not currently on any blood thinners or Plavix.    Past Medical History:   Diagnosis Date   ??? Cancer (CMS-HCC)    ??? Cognitive impairment    ??? Dementia (CMS-HCC)    ??? GERD (gastroesophageal reflux disease)    ??? History of stroke 09/22/2019   ??? History of tobacco abuse 09/22/2019   ??? Hypertension 09/22/2019   ??? Hypokalemia 09/22/2019   ??? Peptic ulcer    ??? Pneumonia due to COVID-19 virus 09/22/2019       Patient Active Problem List   Diagnosis   ??? Gallbladder polyp   ??? GERD (gastroesophageal reflux disease)   ??? Hypertension   ??? H/O: CVA (cerebrovascular accident)   ??? History of tobacco abuse   ??? Large bowel obstruction (CMS-HCC)   ??? Malignant neoplasm of sigmoid colon (CMS-HCC)   ??? Encounter for screening for other viral diseases    ??? Anemia   ??? Belching   ??? Chronic back pain   ??? COPD (chronic obstructive pulmonary disease) (CMS-HCC)   ??? Degenerative disc disease, lumbar   ??? History of malignant neoplasm of colon   ??? Generalized anxiety disorder   ??? Iron deficiency anemia due to chronic blood loss   ??? Left upper quadrant pain   ??? Melena   ??? Malignant tumor of colon (CMS-HCC)   ??? Altered mental status   ??? Hyponatremia       Past Surgical History:   Procedure Laterality Date   ??? COLECTOMY     ??? IR INSERT PORT AGE GREATER THAN 5 YRS  03/07/2020    IR INSERT PORT AGE GREATER THAN 5 YRS 03/07/2020 Alease Frame, NP IMG [facility]   ??? LAPAROSCOPIC CHOLECYSTECTOMY     ??? PR COLONOSCOPY FLX WITH ENDOSCOPIC STENT PLACEMENT N/A 01/20/2020    Procedure: SIGMOIDOSCOPY, FLEXIBLE; WITH ENDOSCOPIC STENT PLACEMENT (INCLUDES PRE- AND POST-DILATION AND GUIDE WIRE PASSAGE, WHEN PERFORMED);  Surgeon: Vonda Antigua, MD;  Location: MAIN OR [facility];  Service: Gastroenterology   ??? PR LAP,SURG,COLECTOMY, PARTIAL, W/ANAST N/A 01/25/2020    Procedure: LAPAROSCOPY, SURGICAL; COLECTOMY, PARTIAL WITH ANASTOMOSIS;  Surgeon: Sharyn Lull, MD;  Location: MAIN OR [facility];  Service: Gastrointestinal       No current facility-administered medications for this encounter.    Current Outpatient Medications:   ???  albuterol HFA 90 mcg/actuation inhaler, INHALE TWO PUFFS EVERY 6 HOURS AS NEEDED FOR WHEEZING OR SHORTNESS OF BREATH, Disp: , Rfl:   ???  amLODIPine (NORVASC) 2.5 MG tablet, Take 2 tablets (5 mg total) by mouth daily., Disp: 60 tablet, Rfl: 3  ???  atorvastatin (LIPITOR) 80 MG tablet, , Disp: , Rfl:   ???  capecitabine (XELODA) 150 MG tablet, Take 2 tablets (300 mg total) by mouth Two (2) times a day  with 1 other capecitabine prescription for 1,800 mg total. Take Days 1 through 14, then 1 week off., Disp: 56 tablet, Rfl: 6  ???  capecitabine (XELODA) 500 MG tablet, Take 3 tablets (1,500 mg total) by mouth Two (2) times a day  with 1 other capecitabine prescription for 1,800 mg total. Take Days 1 through 14, then 1 week off., Disp: 84 tablet, Rfl: 6  ???  cetirizine (ZYRTEC) 10 MG tablet, Take 10 mg by mouth. , Disp: , Rfl:   ???  cyanocobalamin, vitamin B-12, 5,000 mcg Subl, DISSOLVE 1 TABLET UNDER TONGUE DAILY, Disp: , Rfl:   ???  diphenhydrAMINE-acetaminophen (TYLENOL PM) 25-500 mg Tab, Take 2 tablets by mouth.,  Disp: , Rfl:   ???  docusate sodium (COLACE) 100 MG capsule, Take 100 mg by mouth. , Disp: , Rfl:   ???  famotidine (PEPCID) 20 MG tablet, Take 20 mg by mouth., Disp: , Rfl:   ???  gabapentin (NEURONTIN) 100 MG capsule, TAKE ONE CAPSULE TWICE DAILY, Disp: , Rfl:   ???  heparin, porcine, PF, 100 unit/mL Syrg, Infuse 5 mL into a venous catheter every fourteen (14) days. For use while patient is on fluorouracil (5-FU) home infusion.  Flush IV catheter with heparin 5 mL as directed after final saline flush per SASH method and as needed for line maintenance., Disp: 4 each, Rfl: PRN  ???  HYDROcodone-acetaminophen (NORCO) 5-325 mg per tablet, Take 1 tablet by mouth every six (6) hours as needed for pain. (Patient not taking: Reported on 05/14/2020), Disp: 30 tablet, Rfl: 0  ???  loperamide (IMODIUM) 2 mg capsule, Take 2 capsules to start, then 1 capsule every 2 hours until diarrhea free for 12 hours. (Patient not taking: Reported on 04/16/2020), Disp: 60 capsule, Rfl: 11  ???  melatonin 10 mg TbMP, Take 1 tablet by mouth every evening., Disp: , Rfl:   ???  memantine (NAMENDA) 10 MG tablet, Take 10 mg by mouth Two (2) times a day., Disp: , Rfl:   ???  mirtazapine (REMERON) 15 MG tablet, Take 2 tablets (30 mg total) by mouth nightly., Disp: 90 tablet, Rfl: 0  ???  ondansetron (ZOFRAN) 8 MG tablet, Take 1 tablet (8 mg total) by mouth every eight (8) hours as needed for nausea. (Patient not taking: Reported on 04/16/2020), Disp: 30 tablet, Rfl: 2  ???  oxyCODONE (ROXICODONE) 5 MG immediate release tablet, Take 1/2 tablet (2.5mg ) by mouth every 4-6 hours as needed for pain (Patient not taking: Reported on 04/16/2020), Disp: 10 tablet, Rfl: 0  ???  pantoprazole (PROTONIX) 40 MG tablet, , Disp: , Rfl:   ???  potassium chloride (KLOR-CON) 10 MEQ CR tablet, Take 1 tablet by mouth in the morning., Disp: , Rfl:   ???  prochlorperazine (COMPAZINE) 10 MG tablet, Take 1 tablet (10 mg total) by mouth every six (6) hours as needed (nausea). (Patient not taking: Reported on 04/16/2020), Disp: 30 tablet, Rfl: 2  ???  psyllium (METAMUCIL) 3.4 gram packet, Take 1 packet by mouth in the morning., Disp: , Rfl:   ???  sodium chloride (NS) 0.9 % injection, Infuse 10 mL into a venous catheter every fourteen (14) days. For use while patient is on fluorouracil (5-FU) home infusion.  Flush IV catheter with normal saline 10 mL prior to and after infusion followed by heparin flush per SASH method and as needed for line maintenance., Disp: 6 each, Rfl: PRN  ???  sulfamethoxazole-trimethoprim (BACTRIM DS) 800-160 mg per tablet, Take 1 mg by mouth Two (2) times a day (at 8am and 12:00)., Disp: , Rfl:   ???  traZODone (DESYREL) 50 MG tablet, Take 75 mg by mouth., Disp: , Rfl:     Allergies  Patient has no known allergies.    No family history on file.    Social History  Social History     Tobacco Use   ??? Smoking status: Never Smoker   ??? Smokeless tobacco: Never Used   Vaping Use   ??? Vaping Use: Never used   Substance Use Topics   ??? Alcohol use: Never   ??? Drug use: Never       Review of Systems  ROS difficult to obtain 2/2  dementia and AMS      Physical Exam     ED Triage Vitals [06/06/20 1235]   Enc Vitals Group      BP 140/91      Heart Rate 82      SpO2 Pulse       Resp 22      Temp 37 ??C (98.6 ??F)      Temp src       SpO2 100 %       Constitutional: Appears stated age. Chronically ill appearing, in no acute distress.   HEENT: Normocephalic and atraumatic.Conjunctivae clear. No congestion. Moist mucous membranes.   Heme/Lymph/Immuno: No petechiae or bruising  CV: RRR, no murmurs.   Resp: Clear to auscultation bilaterally. No wheezes or rhonchii  GI: Soft and non tender, non distended.   MSK: Normal range of motion in all extremities.  Neuro: Dysarthric speech, left facial droop, midline tongue, oriented to self and place only, no pronator drift in upper or lower extremities, 4+ strength in upper extremity and lower extremity on the left, 5/5 strength in right upper and lower extremity.  Skin: Warm, dry and intact.  Psychiatric: Mood and affect are normal. Speech and behavior are normal.    EKG     Normal sinus rhythm at 67 bpm, normal axis and intervals, no ST elevation or depressions    Radiology     XR Chest Portable   Final Result   Diffuse prominence of the vascular and interstitial markings of lungs, suspicious for pulmonary edema. Bibasilar opacities which could represent atelectasis or infiltrate.      CT Head Wo Contrast   Final Result   No acute intracranial abnormalities.      MRI brain without contrast    (Results Pending)         I personally reviewed the images and lab results for the patient.            Alfredia Ferguson, MD  Resident  06/06/20 [phone]

## 2020-06-06 NOTE — Unmapped (Signed)
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 Pt has new dysarthria and mild L sided facial droop. Additionally reports worsening dysphagia and appears more confused. Per daughter, duration of symptoms has been last couple of days. VSS

## 2020-06-06 NOTE — Unmapped (Unsigned)
PT in lab for port access and lab draws, 20 3/4 gauge needle used, blood return brisk, saline locked, Labs sent for analysis. Accessed By Jae Dire RN

## 2020-06-06 NOTE — Unmapped (Cosign Needed)
Initial Consult Note        Requesting Attending Physician:  Blima Singer, MD  Service Requesting Consult: Emergency Medicine     Assessment and Plan          Danny Hunt is a 70 y.o. right handed male on whom I have been asked by Blima Singer, MD to consult for dysarthria and L sided facial droop. No focal deficits present on exam that are concerning for stroke. CT head wo contrast not concerning for hemorrhage or infarct 2/2 LVO.      Toxic metabolic encephalopathy: Based upon patient's past medical history, clinical presentation and neurological exam the patient is most likely suffering a toxic metabolic encephalopathy.  Given the patient's history of significant vascular dementia he is at an increased risk of having encephalopathy.  Given the caregivers report of urinary retention, difficulty urinating the patient is most likely suffering a urinary tract infection or another underlying toxic metabolic etiology leading to towards his altered mental status and dysarthria.  The rest of the patient's neurological exam is nonfocal, decreasing the likelihood of a acute ischemic stroke.  On exam he does have negative myoclonus which may indicate elevated ammonia, as well as a history of parkinsonism indicating damage to his basal ganglia which may be secondary to vascular dementia or another process.  Given the patient's history of colon cancer an MRI of the brain with and without contrast will be necessary to evaluate for malignant metastasis to the patient's brain which may also lead to encephalopathy.  Given the patient's parkinsonism seen on exam please limit medications such as typical and atypical antipsychotics, metoclopramide and other medications that affect dopamine.  Please also limit medications that are sedate of or act on the central nervous system such as benzodiazepines and antihistamines.  Patient is also at significant risk for hospital-acquired delirium, please ensure delirium precautions are being followed for this patient.  Please continue to monitor the patient's neurological exam for acute changes which may indicate seizures, though the suspicion is very low.  Patient will follow up with [facility] neurology in the outpatient setting, a referral will be sent by the on-call neurology resident.    Recommendations:   1.  Please monitor the patient for toxic workup including but not limited to  CBC, CMP, blood cultures, urinalysis, urine culture, lactate, ammonia, hepatic function panel and chest x-ray to work-up a toxic metabolic cause. Metabolic work-up including B1, B6, folate, B12, ESR, lipase, CK, VBG and TSH  2.  MRI of the brain with and without for further evaluation    3.  Please limit central acting medications including but not limited to opioids, benzodiazepine, atypical and typical antipsychotics, metoclopramide, antihistamines and other dopaminergic agents.  4.  Delirium precautions  5.  Please continue to monitor the patient's neurological status  6.  Patient will follow up with [facility] neurology in the outpatient setting, a referral will be sent by the on-call neurology resident.    This patient was discussed with Dr. Dina Rich, who agrees to the assessment and plan.   Please page the neurology consult pager [phone] with any questions. We will continue to follow along with interest.    Idelia Salm DO PGY-2  [facility] Neurology          HPI        Reason for Consult:     Danny Hunt is a 70 y.o. right handed male with a past medical history of CVA in 2019, cognitive impairment,  hypertension, stage III colon cancer whom I have been asked by Blima Singer, MD to consult for altered mental status, dysarthria and a left-sided facial droop.     Patient was brought to the ED from his oncology appointment due to concern for a stroke. Daughter is present at the bedside and provides most of the history. Per daughter, patient has appeared more confused and has a left-sided facial droop. Also notes worsening dysarthria, dysphagia and lower extremity weakness. Patient had a prior CVA in 2019 and has residual deficits at baseline. He has been following with [facility] for treatment of stage IIIC sigmoid colon adenocarcinoma. Has been receiving chemotherapy which was held today due to a concern for possible CVA. Per daughter, has a remote history of alcohol use but is not a current drinker. Patient also has been having urinary symptoms concerning for a UTI, as per nursing who visited the patient recently. The daughter also stated that he has been having a productive cough which is chronic for him. No recent changes in medication, trauma but has been having worsening of his tremor which has caused him to have difficulty holding a cup and using a knife and fork.       No Known Allergies     No current facility-administered medications for this encounter.     Current Outpatient Medications   Medication Sig Dispense Refill   ??? albuterol HFA 90 mcg/actuation inhaler INHALE TWO PUFFS EVERY 6 HOURS AS NEEDED FOR WHEEZING OR SHORTNESS OF BREATH     ??? amLODIPine (NORVASC) 2.5 MG tablet Take 2 tablets (5 mg total) by mouth daily. 60 tablet 3   ??? atorvastatin (LIPITOR) 80 MG tablet      ??? capecitabine (XELODA) 150 MG tablet Take 2 tablets (300 mg total) by mouth Two (2) times a day  with 1 other capecitabine prescription for 1,800 mg total. Take Days 1 through 14, then 1 week off. 56 tablet 6   ??? capecitabine (XELODA) 500 MG tablet Take 3 tablets (1,500 mg total) by mouth Two (2) times a day  with 1 other capecitabine prescription for 1,800 mg total. Take Days 1 through 14, then 1 week off. 84 tablet 6   ??? cetirizine (ZYRTEC) 10 MG tablet Take 10 mg by mouth.      ??? cyanocobalamin, vitamin B-12, 5,000 mcg Subl DISSOLVE 1 TABLET UNDER TONGUE DAILY     ??? diphenhydrAMINE-acetaminophen (TYLENOL PM) 25-500 mg Tab Take 2 tablets by mouth.     ??? docusate sodium (COLACE) 100 MG capsule Take 100 mg by mouth.      ??? famotidine (PEPCID) 20 MG tablet Take 20 mg by mouth.     ??? gabapentin (NEURONTIN) 100 MG capsule TAKE ONE CAPSULE TWICE DAILY     ??? heparin, porcine, PF, 100 unit/mL Syrg Infuse 5 mL into a venous catheter every fourteen (14) days. For use while patient is on fluorouracil (5-FU) home infusion.   Flush IV catheter with heparin 5 mL as directed after final saline flush per SASH method and as needed for line maintenance. 4 each PRN   ??? HYDROcodone-acetaminophen (NORCO) 5-325 mg per tablet Take 1 tablet by mouth every six (6) hours as needed for pain. (Patient not taking: Reported on 05/14/2020) 30 tablet 0   ??? loperamide (IMODIUM) 2 mg capsule Take 2 capsules to start, then 1 capsule every 2 hours until diarrhea free for 12 hours. (Patient not taking: Reported on 04/16/2020) 60 capsule 11   ???  melatonin 10 mg TbMP Take 1 tablet by mouth every evening.     ??? memantine (NAMENDA) 10 MG tablet Take 10 mg by mouth Two (2) times a day.     ??? mirtazapine (REMERON) 15 MG tablet Take 2 tablets (30 mg total) by mouth nightly. 90 tablet 0   ??? ondansetron (ZOFRAN) 8 MG tablet Take 1 tablet (8 mg total) by mouth every eight (8) hours as needed for nausea. (Patient not taking: Reported on 04/16/2020) 30 tablet 2   ??? oxyCODONE (ROXICODONE) 5 MG immediate release tablet Take 1/2 tablet (2.5mg ) by mouth every 4-6 hours as needed for pain (Patient not taking: Reported on 04/16/2020) 10 tablet 0   ??? pantoprazole (PROTONIX) 40 MG tablet      ??? potassium chloride (KLOR-CON) 10 MEQ CR tablet Take 1 tablet by mouth in the morning.     ??? prochlorperazine (COMPAZINE) 10 MG tablet Take 1 tablet (10 mg total) by mouth every six (6) hours as needed (nausea). (Patient not taking: Reported on 04/16/2020) 30 tablet 2   ??? psyllium (METAMUCIL) 3.4 gram packet Take 1 packet by mouth in the morning.     ??? sodium chloride (NS) 0.9 % injection Infuse 10 mL into a venous catheter every fourteen (14) days. For use while patient is on fluorouracil (5-FU) home infusion.   Flush IV catheter with normal saline 10 mL prior to and after infusion followed by heparin flush per SASH method and as needed for line maintenance. 6 each PRN   ??? sulfamethoxazole-trimethoprim (BACTRIM DS) 800-160 mg per tablet Take 1 mg by mouth Two (2) times a day (at 8am and 12:00).     ??? traZODone (DESYREL) 50 MG tablet Take 75 mg by mouth.      (Not in a hospital admission)      Past Medical History:   Diagnosis Date   ??? Cancer (CMS-HCC)    ??? Cognitive impairment    ??? Dementia (CMS-HCC)    ??? GERD (gastroesophageal reflux disease)    ??? History of stroke 09/22/2019   ??? History of tobacco abuse 09/22/2019   ??? Hypertension 09/22/2019   ??? Hypokalemia 09/22/2019   ??? Peptic ulcer    ??? Pneumonia due to COVID-19 virus 09/22/2019       Past Surgical History:   Procedure Laterality Date   ??? COLECTOMY     ??? IR INSERT PORT AGE GREATER THAN 5 YRS  03/07/2020    IR INSERT PORT AGE GREATER THAN 5 YRS 03/07/2020 Alease Frame, NP IMG VIR [facility]   ??? LAPAROSCOPIC CHOLECYSTECTOMY     ??? PR COLONOSCOPY FLX WITH ENDOSCOPIC STENT PLACEMENT N/A 01/20/2020    Procedure: SIGMOIDOSCOPY, FLEXIBLE; WITH ENDOSCOPIC STENT PLACEMENT (INCLUDES PRE- AND POST-DILATION AND GUIDE WIRE PASSAGE, WHEN PERFORMED);  Surgeon: Vonda Antigua, MD;  Location: MAIN OR [facility];  Service: Gastroenterology   ??? PR LAP,SURG,COLECTOMY, PARTIAL, W/ANAST N/A 01/25/2020    Procedure: LAPAROSCOPY, SURGICAL; COLECTOMY, PARTIAL WITH ANASTOMOSIS;  Surgeon: Sharyn Lull, MD;  Location: MAIN OR [facility];  Service: Gastrointestinal       Social History     Socioeconomic History   ??? Marital status: Married   Tobacco Use   ??? Smoking status: Never Smoker   ??? Smokeless tobacco: Never Used   Vaping Use   ??? Vaping Use: Never used   Substance and Sexual Activity   ??? Alcohol use: Never   ??? Drug use: Never   ??? Sexual activity: Not Currently  No family history on file.     Code Status: DNR and DNI     Review of Systems     A 10-system review of systems was conducted and was negative except as documented above in the HPI.       Objective        Temp:  [36.5 ??C-37 ??C] 37 ??C  Heart Rate:  [68-90] 68  SpO2 Pulse:  [67-79] 67  Resp:  [16-22] 22  BP: (131-162)/(80-105) 155/105  MAP (mmHg):  [101-119] 119  SpO2:  [96 %-100 %] 97 %  I/O this shift:  In: -   Out: 500 [Urine:500]    Physical Exam:  General Appearance:Chronically ill appearing.  HEENT: Head is atraumatic and normocephalic. Sclera anicteric without injection. Oropharyngeal membranes are dry.  Neck: Deferred.  Lungs: Clear to auscultation bilaterally.  Heart: Regular rate and rhythm.   Abdomen: Deferred.  Extremities: No clubbing, cyanosis, or edema.    Neurological Examination:     Mental Status:  Alert and awake.  Awake, alert, tracking oriented only to self, unable to name year.   Intermittently following commands, unable to follow complex commands, unable to follow multi-step commands. Confusing left and right. Speech is spontaneous and non-fluent.     Cranial Nerves: Visual fields intact to direct confrontation.  Pupils are  3+ equal and reactive to light and accommodation facial sensation intact bilaterally to light touch on the forehead, cheek, and chin. Significant dysarthria difficulty understanding speech. Face is symmetric bilaterally with minor decreased facial movement throughout. Hearing intact to conversation. Shoulder shrug full strength bilaterally. Palate movement is symmetric. Tongue protrudes midline and tongue movements are normal.    Motor Exam: Normal bulk.  Negative myoclonus seen in the upper extremities.  Bradykinesia, negative pill-rolling tremor  RUE tone on the Modified Ashworth Scale: intermittent paratonia.  LUE tone on the Modified Ashworth Scale: intermittent paratonia.Marland Kitchen  RLE tone on the Modified Ashworth Scale: intermittent paratonia.Marland Kitchen  LLE tone on the Modified Ashworth Scale: intermittent paratonia..    RUE strength 5/5.  LUE strength 5/5.   RLE strength 5/5.   LLE strength 5/5.    Reflexes: R L   Biceps +2 +2   Brachioradialis +2 +2   Triceps +2 +2   Patella +2 +2   Achilles +2 +2   No clonus bilaterally. No Hoffman's present bilaterally.    Sensory: Intact to light touch and noxious in all four extremities.    Cerebellar/Coordination/Gait: Unable to assess due to mental status.            Diagnostic Studies      All Labs Last 24hrs:   Recent Results (from the past 24 hour(s))   Magnesium Level    Collection Time: 06/06/20 10:54 AM   Result Value Ref Range    Magnesium 1.9 1.6 - 2.6 mg/dL   Creatinine    Collection Time: 06/06/20 10:54 AM   Result Value Ref Range    Creatinine 0.99 0.60 - 1.10 mg/dL    eGFR CKD-EPI (1610) Male 79 >=60 mL/min/1.79m2   Potassium Level    Collection Time: 06/06/20 10:54 AM   Result Value Ref Range    Potassium 3.6 3.4 - 4.8 mmol/L   CBC w/ Differential    Collection Time: 06/06/20 10:54 AM   Result Value Ref Range    WBC 6.7 3.6 - 11.2 10*9/L    RBC 4.19 (L) 4.26 - 5.60 10*12/L    HGB 12.5 (L) 12.9 - 16.5 g/dL  HCT 36.6 (L) 39.0 - 48.0 %    MCV 87.3 77.6 - 95.7 fL    MCH 29.9 25.9 - 32.4 pg    MCHC 34.2 32.0 - 36.0 g/dL    RDW 16.1 (H) 09.6 - 15.2 %    MPV 7.0 6.8 - 10.7 fL    Platelet 221 150 - 450 10*9/L    Neutrophils % 51.3 %    Lymphocytes % 27.8 %    Monocytes % 11.6 %    Eosinophils % 6.9 %    Basophils % 2.4 %    Absolute Neutrophils 3.4 1.8 - 7.8 10*9/L    Absolute Lymphocytes 1.9 1.1 - 3.6 10*9/L    Absolute Monocytes 0.8 0.3 - 0.8 10*9/L    Absolute Eosinophils 0.5 0.0 - 0.5 10*9/L    Absolute Basophils 0.2 (H) 0.0 - 0.1 10*9/L    Anisocytosis Moderate (A) Not Present   ECG 12 Lead    Collection Time: 06/06/20  1:12 PM   Result Value Ref Range    EKG Systolic BP  mmHg    EKG Diastolic BP  mmHg    EKG Ventricular Rate 67 BPM    EKG Atrial Rate 67 BPM    EKG P-R Interval 174 ms    EKG QRS Duration 84 ms    EKG Q-T Interval 390 ms    EKG QTC Calculation 412 ms    EKG Calculated P Axis 18 degrees    EKG Calculated R Axis 43 degrees    EKG Calculated T Axis 18 degrees    QTC Fredericia 404 ms   Comprehensive Metabolic Panel    Collection Time: 06/06/20  1:39 PM   Result Value Ref Range    Sodium 138 135 - 145 mmol/L    Potassium 3.9 3.4 - 4.8 mmol/L    Chloride 108 (H) 98 - 107 mmol/L    CO2 24.0 20.0 - 31.0 mmol/L    Anion Gap 6 5 - 14 mmol/L    BUN 13 9 - 23 mg/dL    Creatinine 0.45 4.09 - 1.10 mg/dL    BUN/Creatinine Ratio 13     eGFR CKD-EPI (2021) Male 82 >=60 mL/min/1.60m2    Glucose 89 70 - 179 mg/dL    Calcium 9.2 8.7 - 81.1 mg/dL    Albumin 3.2 (L) 3.4 - 5.0 g/dL    Total Protein 6.4 5.7 - 8.2 g/dL    Total Bilirubin 0.5 0.3 - 1.2 mg/dL    AST 20 <=91 U/L    ALT 8 (L) 10 - 49 U/L    Alkaline Phosphatase 93 46 - 116 U/L   PT-INR    Collection Time: 06/06/20  1:39 PM   Result Value Ref Range    PT 11.5 10.3 - 13.4 sec    INR 0.98    APTT    Collection Time: 06/06/20  1:39 PM   Result Value Ref Range    APTT 30.9 24.9 - 36.9 sec    Heparin Correlation 0.2    hsTroponin I (single, no delta)    Collection Time: 06/06/20  1:39 PM   Result Value Ref Range    hsTroponin I 4 <=53 ng/L   CBC w/ Differential    Collection Time: 06/06/20  1:39 PM   Result Value Ref Range    WBC 6.4 3.6 - 11.2 10*9/L    RBC 4.14 (L) 4.26 - 5.60 10*12/L    HGB 12.3 (L) 12.9 - 16.5 g/dL  HCT 36.2 (L) 39.0 - 48.0 %    MCV 87.4 77.6 - 95.7 fL    MCH 29.6 25.9 - 32.4 pg    MCHC 33.8 32.0 - 36.0 g/dL    RDW 16.1 (H) 09.6 - 15.2 %    MPV 6.9 6.8 - 10.7 fL    Platelet 205 150 - 450 10*9/L    Neutrophils % 49.9 %    Lymphocytes % 29.5 %    Monocytes % 12.5 %    Eosinophils % 6.8 %    Basophils % 1.3 %    Absolute Neutrophils 3.2 1.8 - 7.8 10*9/L    Absolute Lymphocytes 1.9 1.1 - 3.6 10*9/L    Absolute Monocytes 0.8 0.3 - 0.8 10*9/L    Absolute Eosinophils 0.4 0.0 - 0.5 10*9/L    Absolute Basophils 0.1 0.0 - 0.1 10*9/L    Anisocytosis Moderate (A) Not Present   Urinalysis with Culture Reflex    Collection Time: 06/06/20  1:47 PM    Specimen: Clean Catch; Urine   Result Value Ref Range    Color, UA Yellow     Clarity, UA Clear     Specific Gravity, UA 1.013 1.003 - 1.030    pH, UA 5.0 5.0 - 9.0    Leukocyte Esterase, UA Small (A) Negative    Nitrite, UA Negative Negative    Protein, UA Negative Negative    Glucose, UA Negative Negative    Ketones, UA Negative Negative    Urobilinogen, UA 0.2 mg/dL 0.2 mg/dL, 1.0 mg/dL    Bilirubin, UA Negative Negative    Blood, UA Negative Negative    RBC, UA 1 <=3 /HPF    WBC, UA 14 (H) <=2 /HPF    Squam Epithel, UA <1 0 - 5 /HPF    Bacteria, UA None Seen None Seen /HPF    Mucus, UA Occasional (A) None Seen /HPF       CT scan of the head without contrast personally reviewed:

## 2020-06-06 NOTE — Unmapped (Signed)
[facility]     REFERRING PROVIDER:   Gibson Ramp, Md  [address]  [place],  [place] [address]    PRIMARY CARE PROVIDER:  Leslye Peer WESTERN  [address]  [place] [address]    CONSULTING PROVIDERS    Marcella Dubs, MD, [facility] gastrointestinal surgery   ____________________________________________________________________    CANCER HISTORY  [id number] Stage IIIC MSS sigmoid colon adenocarcinoma, diagnosed 01/25/20 (s/p laparoscopic sigmoidectomy)    CANCER TREATMENT  Adjuvant 5-FU/LV  03-19-20 C1 (stopped after pump malfunction 2-10am)  04-16-20  C1 capecitabine alone   05-14-20  C2 adding oxaliplatin (delayed 1 week due to fever)  06-06-20 C3 HELD due to concern for CVA  ____________________________________________________________________    ASSESSMENT  1. Stage IIIC sigmoid colon adenocarcinoma s/p laparoscopic sigmoidectomy 01/25/20  2. Concern for CVA vs. recrudescence (dysarthria, dysphagia, facial droop)  3. Pre-operative complete colonic obstruction  4. ECOG 3 postop  5. History of CVA with subsequent dysathric speech (remote) and BLE weakness  6. Underlying Alzheimer's dementia  7. Need for COVID booster    RECOMMENDATIONS   1. Hold C3 capecitabine/oxaliplatin  2. Referred to ED for evaluation of possible CVA  3. Colonoscopy within 3 months as none preoperatively  4. Booster August 26th     DISCUSSION    Danny Hunt presents today with worsening dysarthria and dysphagia for the last couple of days as well as new L facial droop. He additionally appears more confused. Patient was sent to ED for evaluation given concern for possible CVA vs. recrudescence of prior stroke symptoms in setting of cancer treatment vs. Alternate cause.    Additionally daughter reports poor tolerance of oxaliplatin, with patient experience extreme fatigue and poor appetite after las cycle. We will hold chemotherapy for now, and re-visit next week with video visit, pending ED evaluation.     Patient was evaluated with the attending, Dr. Tye Maryland.    Wendie Simmer, MD  Medical Oncology Fellow, PGY4      ______________________________________________________________________  HISTORY     Danny Hunt is seen today at the [facility] for consultation regarding Stage IIIC sigmoid colon adenocarcinoma  at the request of Dr. Milbert Coulter.      Danny Hunt presented to [facility] ED with complaints of abdominal pain and distention due to constipation. CT abdomen/pelvis was concerning for primary colonic malignancy with functional closed-loop large bowel obstruction.??He was transferred to [facility] in [place], [place] under the care of the colorectal surgery team for further management. Due to his significant electrolyte abnormalities, he was placed on remote telemetry.  ??  [facility] gastroenterology service was consulted on admission for sigmoidoscopy and stent placement, which was completed on 01/20/2019. His flexible sigmoidoscopy was significant for complete colonic obstruction. The patient tolerated the procedure well but was noted to have AMS afterwards requiring a telesitter. He underwent laparoscopic sigmoidectomy on 01/25/2020. He tolerated the procedure well. He was discharged on 02/01/2020.     Of note, he has a prior colonoscopy showing colon cancer more than 5 years ago at a different hospital; he received no therapy and was told this resolved without intervention.    INTERVAL HISTORY:    Today, Danny Hunt is accompanied by his daughter.    After the last cycle with additional of oxaliplatin, had significant symptoms. Stayed on couch for 1.5 weeks. He was having bad nausea and poor appetite (though drinking ensure). He overall feels very weak.  He feels bloated easily and appetite poor  He also has also been diagnosed with early parkinsons daughter reports    He has pain in legs and R arm, as well as tingling and numbness in fingers and feet    For last couple of days, speech has been harder to understand and he has had trouble swallowing food.       MEDICAL HISTORY  1. Hypertension  2. prior CVA 2019, residual effect with dysarthric speech   3. Alzheimer's dementia   Past Medical History:   Diagnosis Date   ??? Cancer (CMS-HCC)    ??? Cognitive impairment    ??? Dementia (CMS-HCC)    ??? GERD (gastroesophageal reflux disease)    ??? History of stroke 09/22/2019   ??? History of tobacco abuse 09/22/2019   ??? Hypertension 09/22/2019   ??? Hypokalemia 09/22/2019   ??? Peptic ulcer    ??? Pneumonia due to COVID-19 virus 09/22/2019     Past Surgical History:   Procedure Laterality Date   ??? COLECTOMY     ??? IR INSERT PORT AGE GREATER THAN 5 YRS  03/07/2020    IR INSERT PORT AGE GREATER THAN 5 YRS 03/07/2020 Alease Frame, NP IMG VIR [facility]   ??? LAPAROSCOPIC CHOLECYSTECTOMY     ??? PR COLONOSCOPY FLX WITH ENDOSCOPIC STENT PLACEMENT N/A 01/20/2020    Procedure: SIGMOIDOSCOPY, FLEXIBLE; WITH ENDOSCOPIC STENT PLACEMENT (INCLUDES PRE- AND POST-DILATION AND GUIDE WIRE PASSAGE, WHEN PERFORMED);  Surgeon: Vonda Antigua, MD;  Location: MAIN OR [facility];  Service: Gastroenterology   ??? PR LAP,SURG,COLECTOMY, PARTIAL, W/ANAST N/A 01/25/2020    Procedure: LAPAROSCOPY, SURGICAL; COLECTOMY, PARTIAL WITH ANASTOMOSIS;  Surgeon: Sharyn Lull, MD;  Location: MAIN OR [facility];  Service: Gastrointestinal         No Known Allergies  Medications reviewed in the EMR  Current Outpatient Medications on File Prior to Visit   Medication Sig Dispense Refill   ??? cyanocobalamin, vitamin B-12, 5,000 mcg Subl DISSOLVE 1 TABLET UNDER TONGUE DAILY     ??? potassium chloride (KLOR-CON) 10 MEQ CR tablet Take 1 tablet by mouth in the morning.     ??? sulfamethoxazole-trimethoprim (BACTRIM DS) 800-160 mg per tablet Take 1 mg by mouth Two (2) times a day (at 8am and 12:00).     ??? albuterol HFA 90 mcg/actuation inhaler INHALE TWO PUFFS EVERY 6 HOURS AS NEEDED FOR WHEEZING OR SHORTNESS OF BREATH     ??? amLODIPine (NORVASC) 2.5 MG tablet Take 2 tablets (5 mg total) by mouth daily. 60 tablet 3   ??? atorvastatin (LIPITOR) 80 MG tablet      ??? capecitabine (XELODA) 150 MG tablet Take 2 tablets (300 mg total) by mouth Two (2) times a day  with 1 other capecitabine prescription for 1,800 mg total. Take Days 1 through 14, then 1 week off. 56 tablet 6   ??? capecitabine (XELODA) 500 MG tablet Take 3 tablets (1,500 mg total) by mouth Two (2) times a day  with 1 other capecitabine prescription for 1,800 mg total. Take Days 1 through 14, then 1 week off. 84 tablet 6   ??? cetirizine (ZYRTEC) 10 MG tablet Take 10 mg by mouth.      ??? diphenhydrAMINE-acetaminophen (TYLENOL PM) 25-500 mg Tab Take 2 tablets by mouth.     ??? docusate sodium (COLACE) 100 MG capsule Take 100 mg by mouth.      ??? famotidine (PEPCID) 20 MG tablet Take 20 mg by mouth.     ???  gabapentin (NEURONTIN) 100 MG capsule TAKE ONE CAPSULE TWICE DAILY     ??? heparin, porcine, PF, 100 unit/mL Syrg Infuse 5 mL into a venous catheter every fourteen (14) days. For use while patient is on fluorouracil (5-FU) home infusion.   Flush IV catheter with heparin 5 mL as directed after final saline flush per SASH method and as needed for line maintenance. 4 each PRN   ??? HYDROcodone-acetaminophen (NORCO) 5-325 mg per tablet Take 1 tablet by mouth every six (6) hours as needed for pain. (Patient not taking: Reported on 05/14/2020) 30 tablet 0   ??? loperamide (IMODIUM) 2 mg capsule Take 2 capsules to start, then 1 capsule every 2 hours until diarrhea free for 12 hours. (Patient not taking: Reported on 04/16/2020) 60 capsule 11   ??? melatonin 10 mg TbMP Take 1 tablet by mouth every evening.     ??? memantine (NAMENDA) 10 MG tablet Take 10 mg by mouth Two (2) times a day.     ??? mirtazapine (REMERON) 15 MG tablet Take 2 tablets (30 mg total) by mouth nightly. 90 tablet 0   ??? ondansetron (ZOFRAN) 8 MG tablet Take 1 tablet (8 mg total) by mouth every eight (8) hours as needed for nausea. (Patient not taking: Reported on 04/16/2020) 30 tablet 2   ??? oxyCODONE (ROXICODONE) 5 MG immediate release tablet Take 1/2 tablet (2.5mg ) by mouth every 4-6 hours as needed for pain (Patient not taking: Reported on 04/16/2020) 10 tablet 0   ??? pantoprazole (PROTONIX) 40 MG tablet      ??? prochlorperazine (COMPAZINE) 10 MG tablet Take 1 tablet (10 mg total) by mouth every six (6) hours as needed (nausea). (Patient not taking: Reported on 04/16/2020) 30 tablet 2   ??? psyllium (METAMUCIL) 3.4 gram packet Take 1 packet by mouth in the morning.     ??? sodium chloride (NS) 0.9 % injection Infuse 10 mL into a venous catheter every fourteen (14) days. For use while patient is on fluorouracil (5-FU) home infusion.   Flush IV catheter with normal saline 10 mL prior to and after infusion followed by heparin flush per SASH method and as needed for line maintenance. 6 each PRN   ??? traZODone (DESYREL) 50 MG tablet Take 75 mg by mouth.       No current facility-administered medications on file prior to visit.         FAMILY HISTORY    No known family history of cancer. He has 4 children all healthy.     SOCIAL HISTORY    Presents with his daughter Danny Hunt. She lives with him. 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 No prior tobacco history or recent alcohol use. No other illicit drug use. He enjoys spending time in the yard. He spends most of his day watching TV. His wife passed October 03, 2019 from COVID.     REVIEW OF SYSTEMS    The remainder of a comprehensive 10 systems review is negative.    PHYSICAL EXAM  BP 131/80  - Pulse 90  - Temp 36.5 ??C (97.7 ??F) (Temporal)  - Resp 16  - Wt 95.1 kg (209 lb 9.6 oz)  - SpO2 98%  - BMI 30.07 kg/m??    Wt Readings from Last 3 Encounters:   06/06/20 95.1 kg (209 lb 9.6 oz)   05/14/20 93 kg (205 lb 0.4 oz)   05/14/20 92.6 kg (204 lb 1.6 oz)     Gen: frail, dysathric speech,  In wheelchair  Mental status:oriented to self and location.  HEENT: sclera anicteric, EOMI  Pulm: Breathing comfortably on RA, no accessory muscle movement  CV: appears well perfused. RRR  Abd: mild distention, non-tender to palpation  Ext: warm, no edema  Skin: No significant bruising, bleeding or rash  Neuro: Dysarthric speech, slight L facial droop, impaired grip strength L >R, + tremor      OBJECTIVE DATA  Lab Results   Component Value Date    WBC 6.7 06/06/2020    HGB 12.5 (L) 06/06/2020    HCT 36.6 (L) 06/06/2020    PLT 221 06/06/2020       Lab Results   Component Value Date    NA 143 05/14/2020    K 3.6 06/06/2020    CL 107 05/14/2020    CO2 28.7 05/14/2020    BUN 9 05/14/2020    CREATININE 0.99 06/06/2020    GLU 92 05/14/2020    CALCIUM 9.4 05/14/2020    MG 1.9 06/06/2020    PHOS 3.2 02/01/2020       Lab Results   Component Value Date    BILITOT 0.3 05/14/2020    PROT 6.8 05/14/2020    ALBUMIN 3.3 (L) 05/14/2020    ALT 17 05/14/2020    AST 21 05/14/2020    ALKPHOS 83 05/14/2020       Lab Results   Component Value Date    PT 10.4 03/29/2020    INR 1.02 03/29/2020    APTT 23.0 03/29/2020       01/20/20 Flexible Sigmoidoscopy:  Findings:       A malignant-appearing, intrinsic severe stenosis measuring 2 mm (inner        diameter) was found in the proximal sigmoid colon and was non-traversed.        Biopsies were taken with a cold forceps for histology. Placement of a        0.025 inch x 450 cm angled Visiglide wire was attempted. This passed        successfully. This was stented with a 25 mm x 9 cm WallFlex stent under        fluoroscopic guidance. Stool flowed through the stent. The patient's        abdomen was soft but distended at the end of the procedure.                                                                                   Impression:            - Complete colonic obstruction due to malignancy.                          Biopsied. Prosthesis placed.    PATHOLOGY:  01/25/20:  Final Diagnosis   A: Sigmoid colon, sigmoidectomy  - Moderately differentiated invasive adenocarcinoma invading through the muscularis propria into pericolonic tissue (pT3)  - Lymphovascular invasion extensively present  - 9 of 21 lymph nodes positive for metastatic carcinoma (pN2b, including part A and part B of the specimen)  - All surgical margins free of carcinoma involvement  - Please see synoptic report  ??  B: Colon, proximal sigmoid, colectomy  - Segment of colonic mucosa with no evidence of carcinoma involvement  ??  This electronic signature is attestation that the pathologist personally reviewed the submitted material(s) and the final diagnosis reflects that evaluation.   Electronically signed by Karene Fry, MD on 01/30/2020 at 1704   Synoptic Report     COLON AND RECTUM: Resection, Including Transanal Disk Excision of Rectal Neoplasms  8th Edition - Protocol posted: 03/08/2018  COLON AND RECTUM: RESECTION - A  SPECIMEN   Procedure  Sigmoidectomy    TUMOR   Tumor Site  Sigmoid colon    Histologic Type  Adenocarcinoma    Histologic Grade  G2: Moderately differentiated    Tumor Size  Greatest dimension (Centimeters): 8.5 cm   Additional Dimension (Centimeters)  6 cm   Tumor Extension  Tumor invades through the muscularis propria into pericolorectal tissue    Macroscopic Tumor Perforation  Not identified    Lymphovascular Invasion  Present    Lymphovascular Invasion Type  Small vessel lymphovascular invasion      Large vessel (venous) invasion      Intramural      Extramural    Perineural Invasion  Not identified    Tumor Budding  Number of tumor buds in one ???hotspot??? field (total number in area = 0.785 mm2): 2      Low score (0-4)    Type of Polyp in Which Invasive Carcinoma Arose  Sessile serrated adenoma / sessile serrated polyp    Treatment Effect  No known presurgical therapy    MARGINS   Margins  All margins are uninvolved by invasive carcinoma, high grade dysplasia / intramucosal carcinoma, and low grade dysplasia    Margins Examined  Proximal      Distal      Radial (circumferential) or Mesenteric    Closest Margin  Proximal    LYMPH NODES   Number of Lymph Nodes Involved  9    Number of Lymph Nodes Examined  21 Tumor Deposits  Not identified    PATHOLOGIC STAGE CLASSIFICATION (pTNM, AJCC 8th Edition)      Primary Tumor (pT)  pT3    Regional Lymph Nodes (pN)  pN2b           RADIOLOGY RESULTS   No new imaging today

## 2020-06-06 NOTE — Unmapped (Signed)
Oncology brief communication note    Danny Hunt male with pT3N2bM0 Stage IIIC MSS colon adenocarcinoma, prior CVA, dementia  who presented today for follow-up on adjuvant chemotherapy. He was noted during visit to have new dysarthria and mild L sided facial droop. Additionally reports worsening dysphagia and appears more confused. Per daughter, duration of symptoms has been last couple of days. Given concern for recurrent CVA (vs recrudescence of prior CVA or other neurologic cause), we have referred him to ED for further evaluation and management. Stroke code not indicated given duration of symptoms. We will hold on additional chemotherapy at this time. ED charge notified and patient transported via [employer] fund[employer].    Patient seen and discussed with Dr. Guerry Minors, MD   Heme/Onc Fellow

## 2020-06-07 MED ADMIN — gadobenate dimeglumine (MULTIHANCE) 529 mg/mL (0.1mmol/0.2mL) solution 19 mL: 19 mL | INTRAVENOUS | @ 01:00:00 | Stop: 2020-06-06

## 2020-06-07 NOTE — Unmapped (Signed)
ED Progress Note    I received this patient in signout from Dr. Clovis Fredrickson at 1500 shift change.    Briefly, this is a 70 year old male with a past medical history of CVA, cognitive impairment, dementia, hypertension, tobacco use, stage III colon cancer presenting today with altered mental status.  Patient was brought down from infusion clinic where he was going for chemo follow-up for concern of left-sided facial droop and dysarthria.  Previous provider was concern for recrudescence of old stroke versus new ischemic stroke.  Neurology was consulted who thinks that this is not due to an acute stroke, rather toxic metabolic encephalopathy but recommended extensive lab work-up and MRI.    10:01 PM  MRI results with no acute intracranial pathology.  We paged neurology for recommendations but suspect likely discharge.   ED Course as of 06/06/20 2311   Fri Jun 06, 2020   1706 WBC: 6.4   1707 HGB(!): 12.3   1707 Sodium: 138   1707 Potassium: 3.9   1707 Creatinine: 0.98   1707 Leukocyte Esterase, UA(!): Small   1707 Nitrite, UA: Negative   1707 Bacteria, UA: None Seen   2309 Spoke with neurology who stated nothing to do in the setting of a normal carotid ultrasound and a normal MRI. I reassessed the patient who is pleasantly demented, responding to internal stimuli. Labs reassuring, MRI normal, no droop, repeat neuro exam without any abnormalities. Given this, will dc at this time. Spoke with daughter who is comfortable with this plan. Return precautions given, ok for dc.         Dagoberto Ligas, MD  PGY2, [facility] Emergency Medicine  Pager #: [phone]

## 2020-06-12 DIAGNOSIS — C187 Malignant neoplasm of sigmoid colon: Principal | ICD-10-CM

## 2020-06-12 NOTE — Unmapped (Signed)
Called and left voicemail for patient's daughter, Babette Relic. Advised that we are scheduling patient for video visit on Wed, 6/8 at 5:30 pm with Dr. Tye Maryland so treatment plan and next steps can be discussed. Encouraged call back.

## 2020-06-16 NOTE — Unmapped (Signed)
Called and spoke to daughter to make sure they are aware of video visit with Dr. Tye Maryland scheduled for Wed, 6/8. She is aware.

## 2020-06-18 ENCOUNTER — Encounter: Admit: 2020-06-18 | Discharge: 2020-06-19 | Payer: MEDICARE | Attending: Internal Medicine | Primary: Internal Medicine

## 2020-06-19 NOTE — Unmapped (Signed)
Unable to reach pt via phone. Left message reguarding the pre-appointment rooming that includes screenings, medication, allergy and medical and surgical history review. Left message with [facility] contact information.

## 2020-06-20 NOTE — Unmapped (Signed)
Called and left msg for pt that Dr Tye Maryland asked for a video visit be rescheduled for 6/15 at 3:30. Left callback # for any questions or concerns

## 2020-06-23 NOTE — Unmapped (Signed)
The [facility] has made a second and final attempt to reach this patient to refill the following medication:Capecitabine 150 +500mg .      We have left voicemails on the following phone numbers: [phone] and have sent a MyChart message.    Dates contacted: 6/8,13  Last scheduled delivery: 06/03/20    The patient may be at risk of non-compliance with this medication. The patient should call the [facility] at [phone] (option 4) to refill medication.    [name] Hulda Humphrey   [facility] Specialty Technician

## 2020-06-24 DIAGNOSIS — C187 Malignant neoplasm of sigmoid colon: Principal | ICD-10-CM

## 2020-06-24 DIAGNOSIS — Z1159 Encounter for screening for other viral diseases: Principal | ICD-10-CM

## 2020-06-25 ENCOUNTER — Encounter: Admit: 2020-06-25 | Discharge: 2020-06-26 | Payer: MEDICARE | Attending: Internal Medicine | Primary: Internal Medicine

## 2020-06-25 NOTE — Unmapped (Unsigned)
2P-Attempt to call pt prior to video visit with MD went unanswered. Leaving a VM was not an option.   248pm-Second attempt to call pt stated he was not able to receive calls at this time. I was unable to leave a message.

## 2020-06-26 NOTE — Unmapped (Signed)
Tried calling pt to let them know a video visit with Dr Tye Maryland has been scheduled for 6/22 at 5:30 for him. Could not leave a message at this time

## 2020-06-26 NOTE — Unmapped (Signed)
Patient did not show for his 0800 infusion appt, RN tried to call multiple times unable to leave message.

## 2020-06-27 NOTE — Unmapped (Signed)
Attempting to reach patient's daughter to discuss upcoming rescheduled appointment on Wed, 6/22. Message states person is unable to receive calls at this time. There are no other phone numbers listed for patient.

## 2020-07-01 NOTE — Unmapped (Unsigned)
Patient did not pick up; I called all numbers in the chart.

## 2020-07-02 ENCOUNTER — Encounter: Admit: 2020-07-02 | Discharge: 2020-07-03 | Payer: MEDICARE | Attending: Internal Medicine | Primary: Internal Medicine

## 2020-07-03 ENCOUNTER — Other Ambulatory Visit: Payer: Self-pay | Admitting: Family Medicine

## 2020-07-03 DIAGNOSIS — I10 Essential (primary) hypertension: Secondary | ICD-10-CM

## 2020-07-03 NOTE — Unmapped (Signed)
Received call/voicemail from patient's daughter, Babette Relic with new phone number. In message, she states that patient does not want to pursue any further chemotherapy at this time.     Returned call to [name], straight to voicemail. Left message, encouraged call back, left contact information. Discussed with Dr. Tye Maryland. Can set up video/telephone visit for tomorrow, 6/24 if patient is available.

## 2020-07-03 NOTE — Unmapped (Signed)
Unable to reach pt via phone. Unable to leave a message reguarding the pre-appointment rooming that includes screenings, medication, allergy and medical and surgical history review.

## 2020-07-04 ENCOUNTER — Ambulatory Visit: Admit: 2020-07-04 | Payer: MEDICARE | Attending: Internal Medicine | Primary: Internal Medicine

## 2020-07-04 NOTE — Unmapped (Unsigned)
Attempted to call pt prior to video visit on 2 numbers found in chart, not able to get through. MD and NN made aware.

## 2020-07-05 NOTE — Unmapped (Unsigned)
Patient did not pick up. I will ask our clinic to send certified mail regarding follow up (in person) with APP or MD visit, CEA. He will need colonoscopy organized by Jan 2023 and scans completed next Jan 2023.

## 2020-07-07 NOTE — Unmapped (Signed)
Attempted to reach patient's daughter, Babette Relic, by phone.   [phone] Left VM  [phone] Unable to leave message

## 2020-07-08 NOTE — Unmapped (Unsigned)
No show, I could not leave messages on any of the numbers available in the chart. Will ask our schedulers to send certified letter.

## 2020-07-17 NOTE — Unmapped (Incomplete)
Patient did not respond to calls for today's visit.

## 2020-07-17 NOTE — Unmapped (Signed)
Attempt to reach patient's daughter, Babette Relic.    Phone: [phone]  Left VM, sent text message via [employer]    Phone: [phone]    Trying to reach patient/family to discuss plan of care.

## 2020-07-21 NOTE — Unmapped (Signed)
Was able to reach Danny Hunt's daughter at [phone]. She states that Danny Hunt (patient's daughter) has not been able to pay cell phone bill. I explained to daughter that we have been trying to reach Danny Hunt/patient for weeks but none of the phone numbers have been working. Asked if there was a number to reach her at but she said that there was not. Daughter said that she would send a Facebook message to Danny Hunt to let her know that we have been trying to reach them.     Daughter called back. Spoke with Danny Hunt. She reports that patient does not want to continue with chemotherapy. She states that patient is not doing well. He has Alzheimers and she has been trying to convince him to go to ED or doctor and he refuses. He reports that his stomach is hurting and daughter is concerned that he may have a blockage but again will not go to MD. Babette Relic reports that patient cannot walk well but refuses to use walker or wheelchair, so they walk beside or behind him. He is not sleeping well. Not eating well, not drinking Ensure. Danny Hunt reports that patient says he has very slow stream when urinating. [facility] is no longer coming out to house. Danny Hunt is concerned that she has no assistance from her family. She is open to hospice.     Advised Danny Hunt that team's recommendation is for patient to go to ED for evaluation for the blockage and problems with urination.     Will place referral and contact hospice agencies about coming out to home. Danny Hunt provided me with phone number [phone] which is her sister's mother-in-law who has agreed to be contacted to reach Danny Hunt.     NN will reach out to Dr. Tye Maryland to make her aware.

## 2020-07-22 DIAGNOSIS — C187 Malignant neoplasm of sigmoid colon: Principal | ICD-10-CM

## 2020-07-22 NOTE — Unmapped (Addendum)
External Referral for hospice services ordered. Will reach out to [facility].     Sent email to [employer] with [facility].

## 2020-07-23 NOTE — Unmapped (Signed)
Specialty Medication(s): capecitabine    Danny Hunt has been dis-enrolled from the [facility] specialty pharmacy services due to medication discontinuation resulting from side effect intolerance. Per Epic notes 07/11 22 patient opting for hospice care    Additional information provided to the patient: NA    Kiaja Shorty A [facility] Specialty Pharmacist

## 2020-07-27 DIAGNOSIS — K56609 Unspecified intestinal obstruction, unspecified as to partial versus complete obstruction: Principal | ICD-10-CM

## 2020-07-27 DIAGNOSIS — R978 Other abnormal tumor markers: Principal | ICD-10-CM

## 2020-08-26 ENCOUNTER — Telehealth: Payer: Self-pay

## 2020-08-26 NOTE — Telephone Encounter (Signed)
(  1:58 pmPalliative care SW attempted to conact patient or his daughter. There was no answer at patient's number. Patient's daughter number is out of service.

## 2020-09-10 ENCOUNTER — Encounter

## 2020-09-27 ENCOUNTER — Other Ambulatory Visit: Payer: Self-pay | Admitting: Neurology

## 2020-10-06 ENCOUNTER — Telehealth: Payer: Self-pay

## 2020-10-06 NOTE — Telephone Encounter (Signed)
(  4:48 pm) SW attempted to call patient at his home number to schedule a palliative care visits-SW recevied no answer on the home number and the mobile number is not a working number.

## 2020-11-11 DEATH — deceased

## 2020-11-12 ENCOUNTER — Ambulatory Visit: Payer: Medicare Other | Admitting: Adult Health

## 2022-08-01 IMAGING — CT CT ABD-PELV W/ CM
2 of 5 series · 16 of 46 positions shown, 18 images · IV contrast (Omnipaque or Isovue)
Comparison: 12/06/2018

CLINICAL DATA: Abdominal pain

EXAM:
CT ABDOMEN AND PELVIS WITH CONTRAST
TECHNIQUE: Multidetector CT imaging of the abdomen and pelvis was performed
using the standard protocol following bolus administration of
intravenous contrast.
CONTRAST:  100mL OMNIPAQUE IOHEXOL 300 MG/ML  SOLN

[Series 2: axial st · axial · 0.79mm/px · z∈[+851,+1331]mm · 13 of 108 slices shown, 15 images]
[im 6/108  soft-tissue]
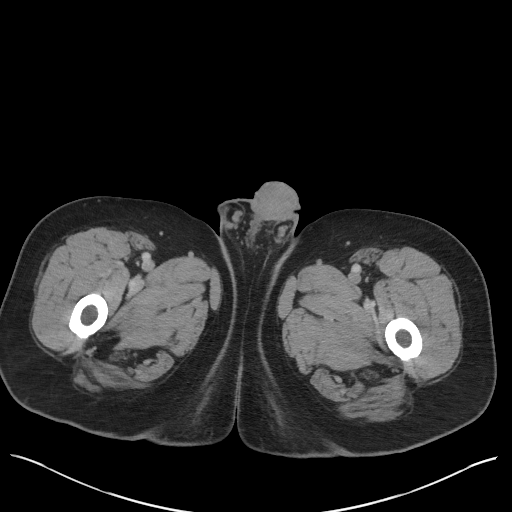
[im 6/108  bone]
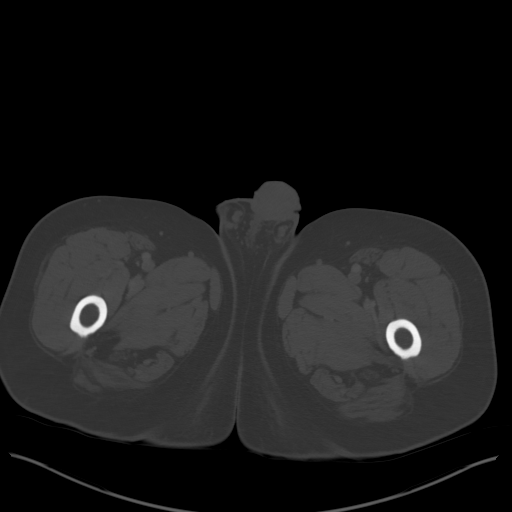
[im 17/108  soft-tissue]
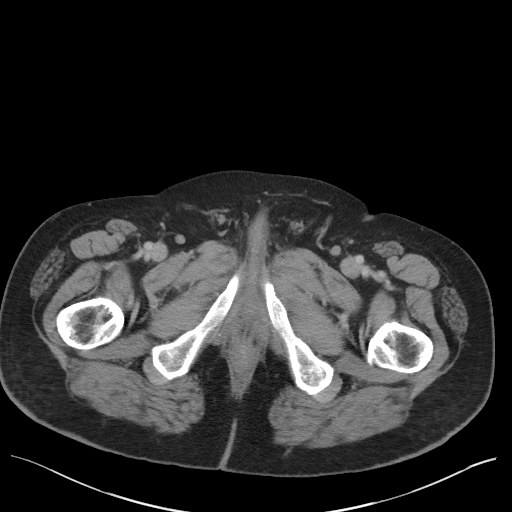
[im 22/108  soft-tissue]
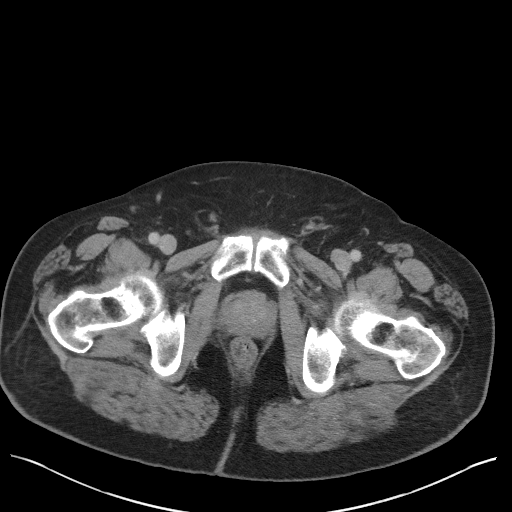
[im 33/108  soft-tissue]
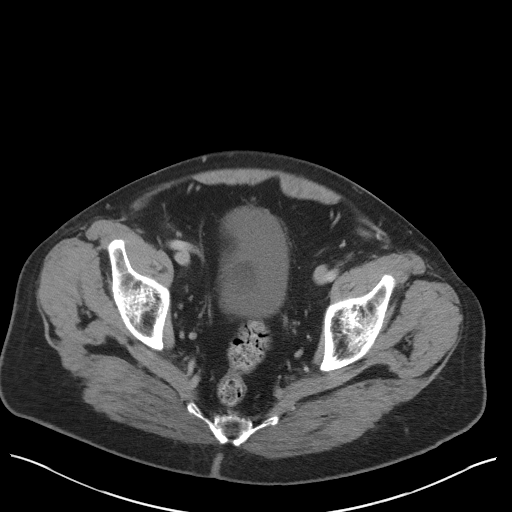
[im 38/108  soft-tissue]
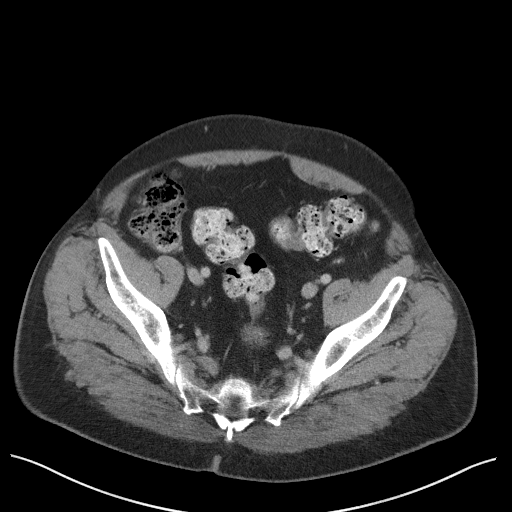
[im 49/108  soft-tissue]
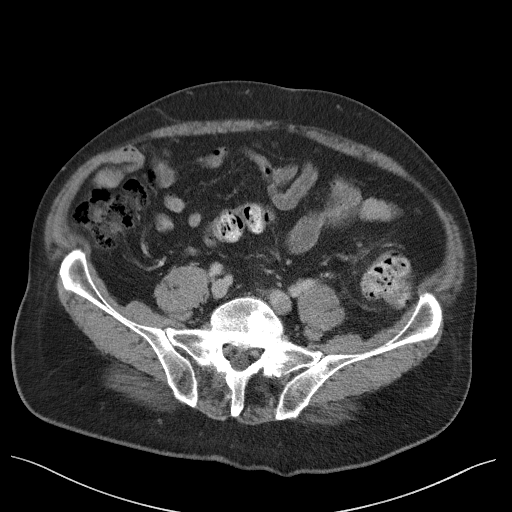
[im 54/108  soft-tissue]
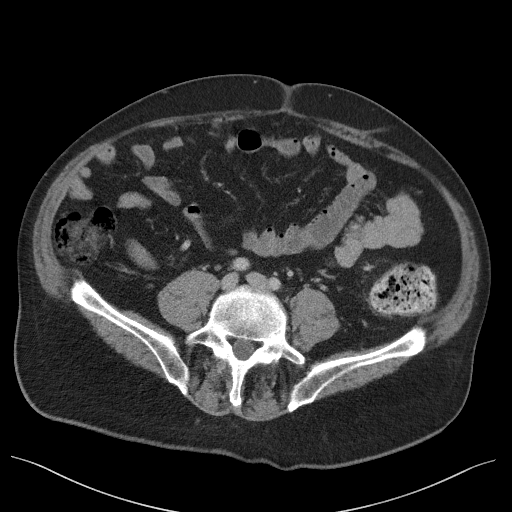
[im 59/108  soft-tissue]
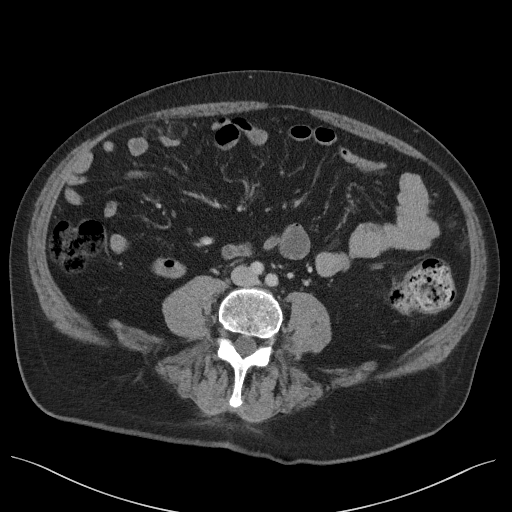
[im 70/108  soft-tissue]
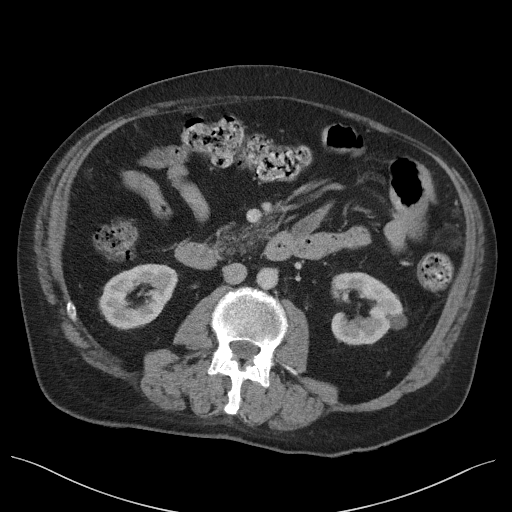
[im 70/108  bone]
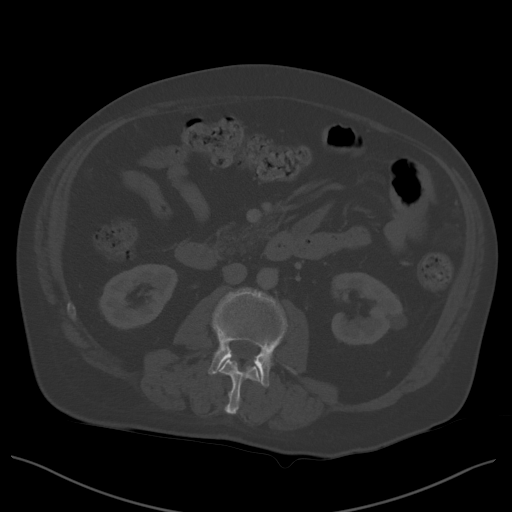
[im 75/108  soft-tissue]
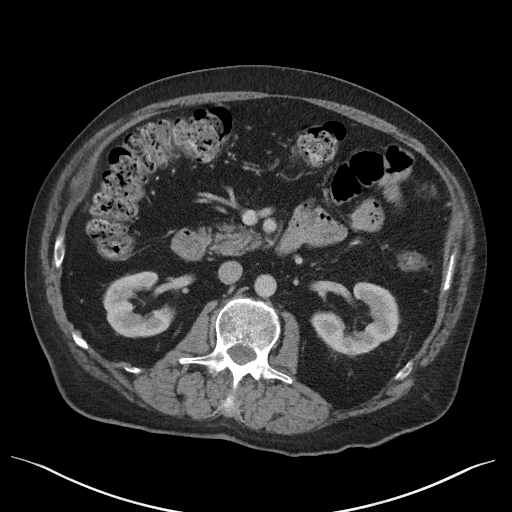
[im 86/108  soft-tissue]
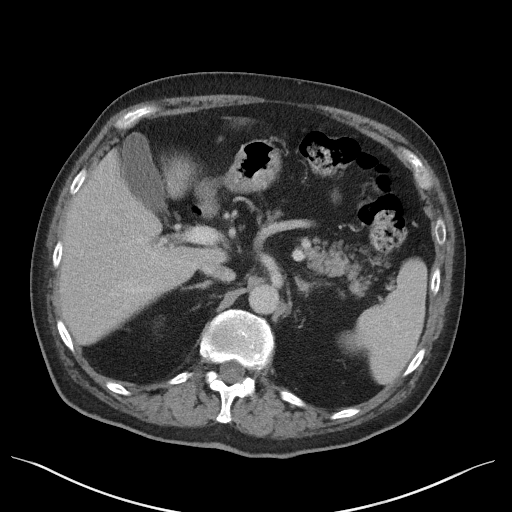
[im 91/108  soft-tissue]
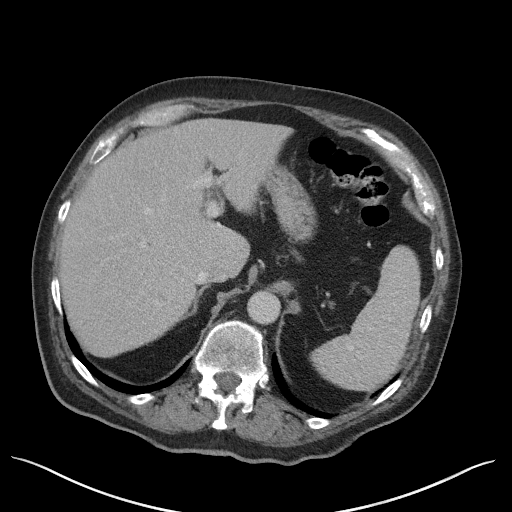
[im 102/108  soft-tissue]
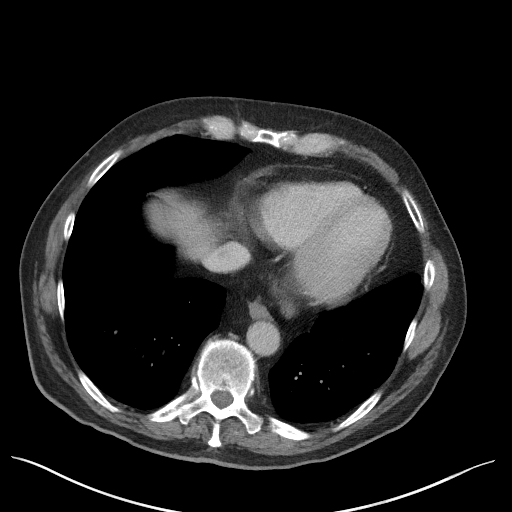

[Series 5: coronal st · coronal · 0.79mm/px · 3 of 110 slices shown]
[im 37/110  soft-tissue]
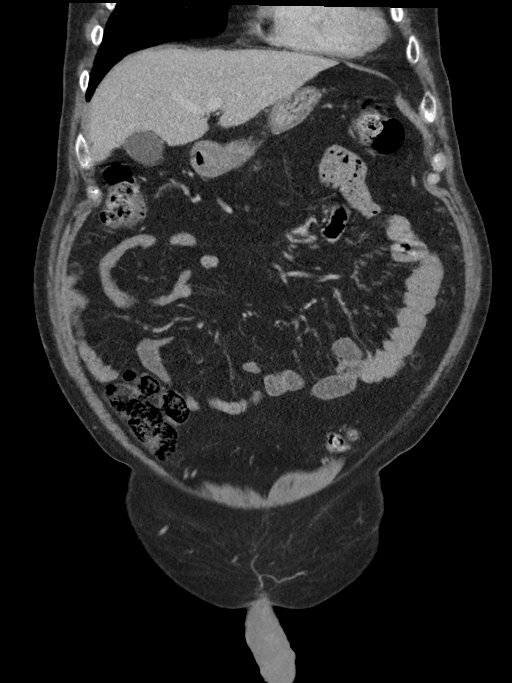
[im 49/110  soft-tissue]
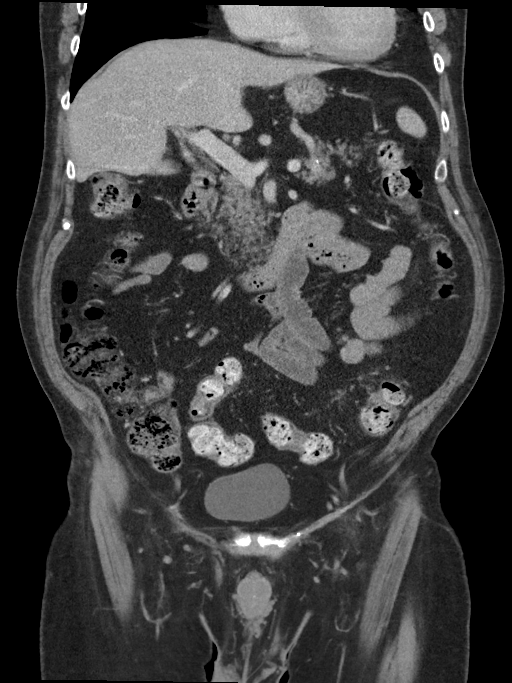
[im 61/110  soft-tissue]
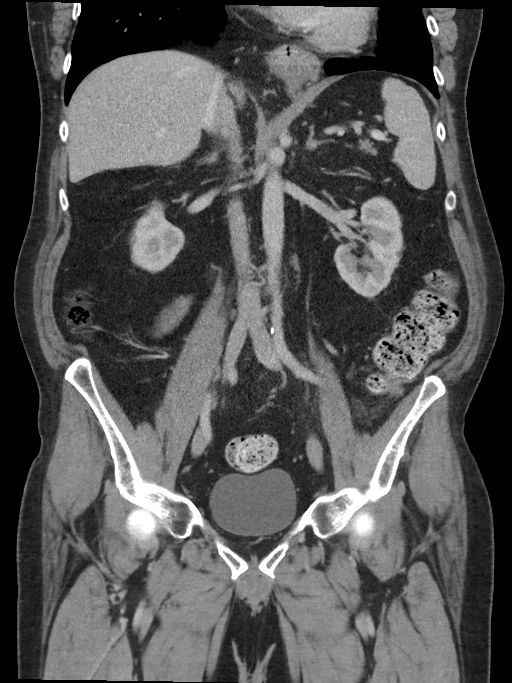

[16 of 46 positions shown; findings below may reference images not displayed]

FINDINGS: Lower chest: No acute abnormality.

Hepatobiliary: Small hypodensity in the left hepatic lobe is stable
compatible with cyst. Small calcification along the superior
gallbladder wall, non dependent. This could reflect adherent stone
or punctate gallbladder wall calcification. No wall thickening or
biliary ductal dilatation.

Pancreas: No focal abnormality or ductal dilatation.

Spleen: No focal abnormality.  Normal size.

Adrenals/Urinary Tract: In No adrenal abnormality. No focal renal
abnormality. No stones or hydronephrosis. Urinary bladder is
unremarkable. Small cyst in the midpole of the left kidney is stable
since prior study.

Stomach/Bowel: Moderate stool burden throughout the colon. Normal
appendix. Stomach, large and small bowel grossly unremarkable.

Vascular/Lymphatic: No evidence of aneurysm or adenopathy.

Reproductive: No visible focal abnormality.

Other: No free fluid or free air. There is haziness within the
intraperitoneal fat laterally in, anterior to the descending colon.
This is new since prior study and could reflect fat necrosis.

Musculoskeletal: No acute bony abnormality. Postoperative changes in
the lower lumbar spine.
IMPRESSION: Punctate non dependent calcification along the superior gallbladder
wall could reflect a 1-2 mm adherent stone or punctate wall
calcification. No other evidence for gallbladder disease.

Moderate stool in the colon.

Areas of haziness/stranding in the left lateral intraperitoneal fat
anterior to the descending colon. This is new since prior study.
This may reflect areas of fat necrosis. This could be followed with
repeat noncontrast CT in 6 months to ensure stability.
